# Patient Record
Sex: Female | Born: 1950
Health system: Southern US, Community
[De-identification: ages and names within clinical notes are randomized; demographics above are authoritative.]

## PROBLEM LIST (undated history)

## (undated) DIAGNOSIS — R112 Nausea with vomiting, unspecified: Secondary | ICD-10-CM

## (undated) DIAGNOSIS — B0231 Zoster conjunctivitis: Secondary | ICD-10-CM

## (undated) DIAGNOSIS — Z9889 Other specified postprocedural states: Secondary | ICD-10-CM

## (undated) DIAGNOSIS — K5792 Diverticulitis of intestine, part unspecified, without perforation or abscess without bleeding: Secondary | ICD-10-CM

## (undated) DIAGNOSIS — M549 Dorsalgia, unspecified: Secondary | ICD-10-CM

## (undated) DIAGNOSIS — M1990 Unspecified osteoarthritis, unspecified site: Secondary | ICD-10-CM

## (undated) DIAGNOSIS — G8929 Other chronic pain: Secondary | ICD-10-CM

## (undated) DIAGNOSIS — M25819 Other specified joint disorders, unspecified shoulder: Secondary | ICD-10-CM

## (undated) DIAGNOSIS — I82409 Acute embolism and thrombosis of unspecified deep veins of unspecified lower extremity: Secondary | ICD-10-CM

## (undated) DIAGNOSIS — Z950 Presence of cardiac pacemaker: Secondary | ICD-10-CM

## (undated) DIAGNOSIS — E039 Hypothyroidism, unspecified: Secondary | ICD-10-CM

## (undated) DIAGNOSIS — J302 Other seasonal allergic rhinitis: Secondary | ICD-10-CM

## (undated) DIAGNOSIS — I441 Atrioventricular block, second degree: Secondary | ICD-10-CM

## (undated) DIAGNOSIS — K219 Gastro-esophageal reflux disease without esophagitis: Secondary | ICD-10-CM

## (undated) DIAGNOSIS — Z780 Asymptomatic menopausal state: Secondary | ICD-10-CM

## (undated) DIAGNOSIS — M7540 Impingement syndrome of unspecified shoulder: Secondary | ICD-10-CM

## (undated) HISTORY — PX: TRIGGER FINGER RELEASE: SHX641

## (undated) HISTORY — PX: CHOLECYSTECTOMY: SHX55

## (undated) HISTORY — PX: EYE SURGERY: SHX253

## (undated) HISTORY — PX: TOTAL KNEE ARTHROPLASTY: SHX125

## (undated) HISTORY — PX: OTHER SURGICAL HISTORY: SHX169

## (undated) HISTORY — PX: VAGINAL HYSTERECTOMY: SUR661

## (undated) HISTORY — PX: ACHILLES TENDON SURGERY: SHX542

## (undated) HISTORY — DX: Unspecified osteoarthritis, unspecified site: M19.90

## (undated) HISTORY — DX: Asymptomatic menopausal state: Z78.0

## (undated) HISTORY — PX: KNEE ARTHROSCOPY: SHX127

## (undated) HISTORY — PX: ESOPHAGOGASTRODUODENOSCOPY: SHX1529

---
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
## 1973-08-25 HISTORY — PX: ARTHROGRAM KNEE: SUR67

## 1982-08-25 HISTORY — PX: KNEE ARTHROSCOPY W/ ACL RECONSTRUCTION: SHX1858

## 1998-01-08 ENCOUNTER — Encounter: Admission: RE | Admit: 1998-01-08 | Discharge: 1998-04-08 | Payer: Self-pay | Admitting: Specialist

## 1998-09-05 ENCOUNTER — Ambulatory Visit (HOSPITAL_COMMUNITY): Admission: RE | Admit: 1998-09-05 | Discharge: 1998-09-05 | Payer: Self-pay | Admitting: Specialist

## 1998-11-16 ENCOUNTER — Other Ambulatory Visit: Admission: RE | Admit: 1998-11-16 | Discharge: 1998-11-16 | Payer: Self-pay | Admitting: Obstetrics and Gynecology

## 1999-01-01 ENCOUNTER — Inpatient Hospital Stay (HOSPITAL_COMMUNITY): Admission: RE | Admit: 1999-01-01 | Discharge: 1999-01-06 | Payer: Self-pay | Admitting: Specialist

## 1999-06-10 ENCOUNTER — Inpatient Hospital Stay (HOSPITAL_COMMUNITY): Admission: RE | Admit: 1999-06-10 | Discharge: 1999-06-18 | Payer: Self-pay | Admitting: Specialist

## 1999-06-10 ENCOUNTER — Encounter: Payer: Self-pay | Admitting: Specialist

## 1999-07-23 ENCOUNTER — Ambulatory Visit (HOSPITAL_COMMUNITY): Admission: RE | Admit: 1999-07-23 | Discharge: 1999-07-23 | Payer: Self-pay | Admitting: Specialist

## 1999-11-06 ENCOUNTER — Ambulatory Visit (HOSPITAL_COMMUNITY): Admission: RE | Admit: 1999-11-06 | Discharge: 1999-11-06 | Payer: Self-pay | Admitting: Gastroenterology

## 1999-12-30 ENCOUNTER — Ambulatory Visit (HOSPITAL_COMMUNITY): Admission: RE | Admit: 1999-12-30 | Discharge: 1999-12-30 | Payer: Self-pay | Admitting: Obstetrics and Gynecology

## 1999-12-30 ENCOUNTER — Encounter: Payer: Self-pay | Admitting: Obstetrics and Gynecology

## 2000-03-19 ENCOUNTER — Ambulatory Visit (HOSPITAL_COMMUNITY): Admission: RE | Admit: 2000-03-19 | Discharge: 2000-03-19 | Payer: Self-pay | Admitting: Family Medicine

## 2000-03-19 ENCOUNTER — Encounter: Payer: Self-pay | Admitting: Family Medicine

## 2000-03-26 ENCOUNTER — Ambulatory Visit (HOSPITAL_BASED_OUTPATIENT_CLINIC_OR_DEPARTMENT_OTHER): Admission: RE | Admit: 2000-03-26 | Discharge: 2000-03-26 | Payer: Self-pay | Admitting: Otolaryngology

## 2000-06-19 ENCOUNTER — Other Ambulatory Visit: Admission: RE | Admit: 2000-06-19 | Discharge: 2000-06-19 | Payer: Self-pay | Admitting: Obstetrics and Gynecology

## 2000-10-02 ENCOUNTER — Ambulatory Visit (HOSPITAL_BASED_OUTPATIENT_CLINIC_OR_DEPARTMENT_OTHER): Admission: RE | Admit: 2000-10-02 | Discharge: 2000-10-02 | Payer: Self-pay | Admitting: Orthopedic Surgery

## 2001-08-12 ENCOUNTER — Emergency Department (HOSPITAL_COMMUNITY): Admission: EM | Admit: 2001-08-12 | Discharge: 2001-08-12 | Payer: Self-pay

## 2001-08-23 ENCOUNTER — Encounter: Admission: RE | Admit: 2001-08-23 | Discharge: 2001-10-19 | Payer: Self-pay | Admitting: *Deleted

## 2001-10-14 ENCOUNTER — Ambulatory Visit (HOSPITAL_COMMUNITY): Admission: RE | Admit: 2001-10-14 | Discharge: 2001-10-14 | Payer: Self-pay | Admitting: Gastroenterology

## 2001-12-09 ENCOUNTER — Other Ambulatory Visit: Admission: RE | Admit: 2001-12-09 | Discharge: 2001-12-09 | Payer: Self-pay | Admitting: Obstetrics and Gynecology

## 2002-04-06 ENCOUNTER — Ambulatory Visit (HOSPITAL_BASED_OUTPATIENT_CLINIC_OR_DEPARTMENT_OTHER): Admission: RE | Admit: 2002-04-06 | Discharge: 2002-04-07 | Payer: Self-pay | Admitting: *Deleted

## 2002-06-22 ENCOUNTER — Encounter: Payer: Self-pay | Admitting: Internal Medicine

## 2002-06-22 ENCOUNTER — Ambulatory Visit (HOSPITAL_COMMUNITY): Admission: RE | Admit: 2002-06-22 | Discharge: 2002-06-22 | Payer: Self-pay | Admitting: Internal Medicine

## 2003-05-08 ENCOUNTER — Encounter (INDEPENDENT_AMBULATORY_CARE_PROVIDER_SITE_OTHER): Payer: Self-pay | Admitting: Specialist

## 2003-05-08 ENCOUNTER — Ambulatory Visit (HOSPITAL_COMMUNITY): Admission: RE | Admit: 2003-05-08 | Discharge: 2003-05-08 | Payer: Self-pay | Admitting: Gastroenterology

## 2003-05-26 ENCOUNTER — Encounter: Payer: Self-pay | Admitting: Obstetrics and Gynecology

## 2003-05-26 ENCOUNTER — Ambulatory Visit (HOSPITAL_COMMUNITY): Admission: RE | Admit: 2003-05-26 | Discharge: 2003-05-26 | Payer: Self-pay | Admitting: Obstetrics and Gynecology

## 2004-09-12 ENCOUNTER — Emergency Department (HOSPITAL_COMMUNITY): Admission: EM | Admit: 2004-09-12 | Discharge: 2004-09-12 | Payer: Self-pay | Admitting: Emergency Medicine

## 2005-02-09 ENCOUNTER — Emergency Department (HOSPITAL_COMMUNITY): Admission: EM | Admit: 2005-02-09 | Discharge: 2005-02-09 | Payer: Self-pay | Admitting: *Deleted

## 2005-07-01 ENCOUNTER — Ambulatory Visit (HOSPITAL_COMMUNITY): Admission: RE | Admit: 2005-07-01 | Discharge: 2005-07-01 | Payer: Self-pay | Admitting: Internal Medicine

## 2006-01-28 ENCOUNTER — Ambulatory Visit (HOSPITAL_COMMUNITY): Admission: RE | Admit: 2006-01-28 | Discharge: 2006-01-28 | Payer: Self-pay | Admitting: Gastroenterology

## 2006-01-28 ENCOUNTER — Encounter (INDEPENDENT_AMBULATORY_CARE_PROVIDER_SITE_OTHER): Payer: Self-pay | Admitting: *Deleted

## 2006-02-18 ENCOUNTER — Emergency Department (HOSPITAL_COMMUNITY): Admission: EM | Admit: 2006-02-18 | Discharge: 2006-02-18 | Payer: Self-pay | Admitting: Family Medicine

## 2006-03-04 ENCOUNTER — Emergency Department (HOSPITAL_COMMUNITY): Admission: EM | Admit: 2006-03-04 | Discharge: 2006-03-04 | Payer: Self-pay | Admitting: Family Medicine

## 2006-06-26 ENCOUNTER — Emergency Department (HOSPITAL_COMMUNITY): Admission: EM | Admit: 2006-06-26 | Discharge: 2006-06-26 | Payer: Self-pay | Admitting: Family Medicine

## 2006-09-30 ENCOUNTER — Emergency Department (HOSPITAL_COMMUNITY): Admission: EM | Admit: 2006-09-30 | Discharge: 2006-09-30 | Payer: Self-pay | Admitting: Family Medicine

## 2007-11-03 ENCOUNTER — Inpatient Hospital Stay (HOSPITAL_COMMUNITY): Admission: EM | Admit: 2007-11-03 | Discharge: 2007-11-07 | Payer: Self-pay | Admitting: Emergency Medicine

## 2007-12-01 ENCOUNTER — Ambulatory Visit (HOSPITAL_COMMUNITY): Admission: RE | Admit: 2007-12-01 | Discharge: 2007-12-01 | Payer: Self-pay | Admitting: Gastroenterology

## 2007-12-01 ENCOUNTER — Encounter (INDEPENDENT_AMBULATORY_CARE_PROVIDER_SITE_OTHER): Payer: Self-pay | Admitting: Gastroenterology

## 2008-05-22 ENCOUNTER — Ambulatory Visit (HOSPITAL_COMMUNITY): Admission: RE | Admit: 2008-05-22 | Discharge: 2008-05-22 | Payer: Self-pay | Admitting: Obstetrics and Gynecology

## 2008-07-05 ENCOUNTER — Encounter: Admission: RE | Admit: 2008-07-05 | Discharge: 2008-08-24 | Payer: Self-pay | Admitting: Occupational Medicine

## 2008-08-15 ENCOUNTER — Ambulatory Visit (HOSPITAL_COMMUNITY): Admission: RE | Admit: 2008-08-15 | Discharge: 2008-08-15 | Payer: Self-pay | Admitting: Internal Medicine

## 2008-12-04 ENCOUNTER — Emergency Department (HOSPITAL_COMMUNITY): Admission: EM | Admit: 2008-12-04 | Discharge: 2008-12-04 | Payer: Self-pay | Admitting: Family Medicine

## 2009-01-04 ENCOUNTER — Encounter: Admission: RE | Admit: 2009-01-04 | Discharge: 2009-04-04 | Payer: Self-pay | Admitting: Neurosurgery

## 2009-01-21 ENCOUNTER — Emergency Department (HOSPITAL_COMMUNITY): Admission: EM | Admit: 2009-01-21 | Discharge: 2009-01-21 | Payer: Self-pay | Admitting: Family Medicine

## 2009-02-24 ENCOUNTER — Emergency Department (HOSPITAL_BASED_OUTPATIENT_CLINIC_OR_DEPARTMENT_OTHER): Admission: EM | Admit: 2009-02-24 | Discharge: 2009-02-24 | Payer: Self-pay | Admitting: Emergency Medicine

## 2009-02-24 ENCOUNTER — Ambulatory Visit: Payer: Self-pay | Admitting: Diagnostic Radiology

## 2009-03-02 ENCOUNTER — Ambulatory Visit (HOSPITAL_COMMUNITY): Admission: RE | Admit: 2009-03-02 | Discharge: 2009-03-02 | Payer: Self-pay | Admitting: Gastroenterology

## 2009-05-14 ENCOUNTER — Emergency Department (HOSPITAL_COMMUNITY): Admission: EM | Admit: 2009-05-14 | Discharge: 2009-05-14 | Payer: Self-pay | Admitting: Emergency Medicine

## 2009-05-15 ENCOUNTER — Emergency Department (HOSPITAL_COMMUNITY): Admission: EM | Admit: 2009-05-15 | Discharge: 2009-05-15 | Payer: Self-pay | Admitting: Family Medicine

## 2009-05-16 ENCOUNTER — Emergency Department (HOSPITAL_BASED_OUTPATIENT_CLINIC_OR_DEPARTMENT_OTHER): Admission: EM | Admit: 2009-05-16 | Discharge: 2009-05-16 | Payer: Self-pay | Admitting: Emergency Medicine

## 2009-06-05 ENCOUNTER — Emergency Department (HOSPITAL_COMMUNITY): Admission: EM | Admit: 2009-06-05 | Discharge: 2009-06-05 | Payer: Self-pay | Admitting: Family Medicine

## 2009-06-26 ENCOUNTER — Ambulatory Visit (HOSPITAL_COMMUNITY): Admission: RE | Admit: 2009-06-26 | Discharge: 2009-06-26 | Payer: Self-pay | Admitting: Obstetrics and Gynecology

## 2009-07-13 ENCOUNTER — Encounter: Admission: RE | Admit: 2009-07-13 | Discharge: 2009-07-13 | Payer: Self-pay | Admitting: Occupational Medicine

## 2009-09-20 ENCOUNTER — Ambulatory Visit (HOSPITAL_COMMUNITY): Admission: RE | Admit: 2009-09-20 | Discharge: 2009-09-20 | Payer: Self-pay | Admitting: Neurosurgery

## 2009-10-12 ENCOUNTER — Encounter: Admission: RE | Admit: 2009-10-12 | Discharge: 2010-01-10 | Payer: Self-pay | Admitting: Neurosurgery

## 2009-10-29 ENCOUNTER — Emergency Department (HOSPITAL_COMMUNITY): Admission: EM | Admit: 2009-10-29 | Discharge: 2009-10-29 | Payer: Self-pay | Admitting: Emergency Medicine

## 2009-11-09 ENCOUNTER — Emergency Department (HOSPITAL_COMMUNITY): Admission: EM | Admit: 2009-11-09 | Discharge: 2009-11-09 | Payer: Self-pay | Admitting: Family Medicine

## 2009-11-13 ENCOUNTER — Ambulatory Visit (HOSPITAL_COMMUNITY): Admission: RE | Admit: 2009-11-13 | Discharge: 2009-11-13 | Payer: Self-pay | Admitting: Gastroenterology

## 2009-12-19 ENCOUNTER — Ambulatory Visit: Payer: Self-pay | Admitting: Diagnostic Radiology

## 2009-12-19 ENCOUNTER — Ambulatory Visit (HOSPITAL_BASED_OUTPATIENT_CLINIC_OR_DEPARTMENT_OTHER): Admission: RE | Admit: 2009-12-19 | Discharge: 2009-12-19 | Payer: Self-pay | Admitting: Physician Assistant

## 2010-04-24 ENCOUNTER — Ambulatory Visit (HOSPITAL_COMMUNITY): Admission: RE | Admit: 2010-04-24 | Discharge: 2010-04-24 | Payer: Self-pay | Admitting: Orthopedic Surgery

## 2010-05-08 ENCOUNTER — Emergency Department (HOSPITAL_COMMUNITY): Admission: EM | Admit: 2010-05-08 | Discharge: 2010-05-08 | Payer: Self-pay | Admitting: Family Medicine

## 2010-07-03 ENCOUNTER — Ambulatory Visit (HOSPITAL_COMMUNITY): Admission: RE | Admit: 2010-07-03 | Discharge: 2010-07-03 | Payer: Self-pay | Admitting: Obstetrics and Gynecology

## 2010-08-22 ENCOUNTER — Emergency Department (HOSPITAL_COMMUNITY)
Admission: EM | Admit: 2010-08-22 | Discharge: 2010-08-22 | Payer: Self-pay | Source: Home / Self Care | Admitting: Family Medicine

## 2010-09-14 ENCOUNTER — Encounter (HOSPITAL_COMMUNITY): Payer: Self-pay | Admitting: Obstetrics and Gynecology

## 2010-09-15 ENCOUNTER — Encounter (HOSPITAL_COMMUNITY): Payer: Self-pay | Admitting: Obstetrics and Gynecology

## 2010-10-14 ENCOUNTER — Other Ambulatory Visit (HOSPITAL_COMMUNITY): Payer: Self-pay | Admitting: Obstetrics and Gynecology

## 2010-10-14 DIAGNOSIS — E8940 Asymptomatic postprocedural ovarian failure: Secondary | ICD-10-CM

## 2010-10-22 ENCOUNTER — Ambulatory Visit (HOSPITAL_COMMUNITY): Admission: RE | Admit: 2010-10-22 | Payer: Commercial Managed Care - PPO | Source: Ambulatory Visit

## 2010-11-05 ENCOUNTER — Ambulatory Visit (HOSPITAL_COMMUNITY)
Admission: RE | Admit: 2010-11-05 | Discharge: 2010-11-05 | Disposition: A | Discharge status: Home / Self Care | Payer: 59 | Source: Ambulatory Visit | Attending: Obstetrics and Gynecology | Admitting: Obstetrics and Gynecology

## 2010-11-05 DIAGNOSIS — Z780 Asymptomatic menopausal state: Secondary | ICD-10-CM | POA: Insufficient documentation

## 2010-11-05 DIAGNOSIS — E8940 Asymptomatic postprocedural ovarian failure: Secondary | ICD-10-CM

## 2010-11-05 DIAGNOSIS — Z13820 Encounter for screening for osteoporosis: Secondary | ICD-10-CM | POA: Insufficient documentation

## 2010-11-07 LAB — POCT URINALYSIS DIPSTICK
Glucose, UA: NEGATIVE mg/dL
Nitrite: NEGATIVE
Urobilinogen, UA: 0.2 mg/dL (ref 0.0–1.0)

## 2010-11-07 LAB — URINE CULTURE

## 2010-11-08 LAB — CBC
HCT: 36.1 % (ref 36.0–46.0)
Hemoglobin: 12.4 g/dL (ref 12.0–15.0)
MCHC: 34.3 g/dL (ref 30.0–36.0)
MCV: 90.7 fL (ref 78.0–100.0)

## 2010-11-08 LAB — SURGICAL PCR SCREEN: MRSA, PCR: NEGATIVE

## 2010-11-18 LAB — URINE CULTURE
Colony Count: NO GROWTH
Culture: NO GROWTH

## 2010-11-18 LAB — POCT URINALYSIS DIP (DEVICE)
Glucose, UA: NEGATIVE mg/dL
Glucose, UA: NEGATIVE mg/dL
Ketones, ur: NEGATIVE mg/dL
Ketones, ur: NEGATIVE mg/dL
Protein, ur: 100 mg/dL — AB
Specific Gravity, Urine: 1.015 (ref 1.005–1.030)
Specific Gravity, Urine: 1.015 (ref 1.005–1.030)

## 2010-11-25 ENCOUNTER — Inpatient Hospital Stay (INDEPENDENT_AMBULATORY_CARE_PROVIDER_SITE_OTHER)
Admission: RE | Admit: 2010-11-25 | Discharge: 2010-11-25 | Disposition: A | Discharge status: Home / Self Care | Payer: Commercial Managed Care - PPO | Source: Ambulatory Visit | Attending: Emergency Medicine | Admitting: Emergency Medicine

## 2010-11-25 DIAGNOSIS — N390 Urinary tract infection, site not specified: Secondary | ICD-10-CM

## 2010-11-25 LAB — POCT URINALYSIS DIP (DEVICE)
Protein, ur: NEGATIVE mg/dL
Urobilinogen, UA: 0.2 mg/dL (ref 0.0–1.0)

## 2010-11-26 LAB — URINE CULTURE: Culture: NO GROWTH

## 2010-11-29 LAB — DIFFERENTIAL
Basophils Absolute: 0.1 10*3/uL (ref 0.0–0.1)
Eosinophils Relative: 0 % (ref 0–5)
Lymphocytes Relative: 21 % (ref 12–46)
Monocytes Absolute: 0.7 10*3/uL (ref 0.1–1.0)
Monocytes Relative: 5 % (ref 3–12)

## 2010-11-29 LAB — BASIC METABOLIC PANEL
CO2: 28 mEq/L (ref 19–32)
GFR calc non Af Amer: >60 mL/min (ref >60)
Glucose, Bld: 94 mg/dL (ref 70–99)
Potassium: 3.8 mEq/L (ref 3.5–5.1)
Sodium: 144 mEq/L (ref 135–145)

## 2010-11-29 LAB — CBC
HCT: 38.6 % (ref 36.0–46.0)
Hemoglobin: 12.9 g/dL (ref 12.0–15.0)
RDW: 13.3 % (ref 11.5–15.5)

## 2010-12-02 LAB — COMPREHENSIVE METABOLIC PANEL
Albumin: 4.6 g/dL (ref 3.5–5.2)
BUN: 12 mg/dL (ref 6–23)
Calcium: 9.6 mg/dL (ref 8.4–10.5)
Creatinine, Ser: 0.7 mg/dL (ref 0.4–1.2)
Potassium: 3.9 mEq/L (ref 3.5–5.1)
Total Protein: 8.3 g/dL (ref 6.0–8.3)

## 2010-12-02 LAB — DIFFERENTIAL
Lymphocytes Relative: 38 % (ref 12–46)
Monocytes Absolute: 0.5 10*3/uL (ref 0.1–1.0)
Monocytes Relative: 7 % (ref 3–12)
Neutro Abs: 4.2 10*3/uL (ref 1.7–7.7)
Neutrophils Relative %: 54 % (ref 43–77)

## 2010-12-02 LAB — CBC
HCT: 41.3 % (ref 36.0–46.0)
MCHC: 33.4 g/dL (ref 30.0–36.0)
MCV: 93.9 fL (ref 78.0–100.0)
Platelets: 328 10*3/uL (ref 150–400)
RDW: 12.4 % (ref 11.5–15.5)

## 2010-12-02 LAB — URINALYSIS, ROUTINE W REFLEX MICROSCOPIC
Glucose, UA: NEGATIVE mg/dL
Hgb urine dipstick: NEGATIVE
Specific Gravity, Urine: 1.011 (ref 1.005–1.030)
Urobilinogen, UA: 0.2 mg/dL (ref 0.0–1.0)

## 2011-01-07 NOTE — H&P (Signed)
Kari [name], Kari [name]           ACCOUNT NO.:  [id number]   MEDICAL RECORD NO.:  [id number]          PATIENT TYPE:  INP   LOCATION:  5151                         FACILITY:  [facility]   PHYSICIAN:  Jordan Hawks. Elnoria Howard, MD    DATE OF BIRTH:  [birth year]   DATE OF ADMISSION:  11/03/2007  DATE OF DISCHARGE:                              HISTORY & PHYSICAL   REASON FOR ADMISSION:  Diverticulitis.   HISTORY OF PRESENT ILLNESS:  This is a 60 year old female with a past  medical history of diverticulitis status post cholecystectomy and herpes  ulcer with ophthalmic herpes zoster, hysterectomy, and knee surgery who  is admitted to the hospital with complaints of acute left lower quadrant  abdominal pain.  The patient states that her pain started rapidly on the  day of admission and progressively worsened.  She presented to the  office and was evaluated by Dr. Loreta Ave who felt that she needed to present  to their emergency room for further evaluation and treatment.  While in  the emergency room she underwent a CT scan, but it was unrevealing for  diverticulitis; however, is confirmatory for diverticulosis.  The  patient states that the pain that she currently experiences is very  similar to her prior diverticulitis pain approximately seven years ago.  There is associated nausea but no vomiting.  She denies having any  fever.   There is no history of any diarrhea or constipation and no hematochezia  or melena.   PAST MEDICAL HISTORY/PAST SURGICAL HISTORY:  As stated above.   FAMILY HISTORY:  Noncontributory.   SOCIAL HISTORY:  Negative for alcohol, tobacco, illicit drug use.   REVIEW OF SYSTEMS:  As stated above in History of Present Illness;  otherwise, negative.   ALLERGIES:  CEFTIN and LEVAQUIN.   HOME MEDICATIONS:  Valtrex and estrogen.   PHYSICAL EXAMINATION:  VITAL SIGNS:  Blood pressure is 117/78, heart  rate 85, respirations 24, temperature 99.2.  GENERAL:  The patient is uncomfortable  in appearance.  HEENT: Normocephalic, atraumatic.  Extraocular muscles intact.  NECK:  Supple.  No lymphadenopathy.  LUNGS:  Clear to auscultation bilaterally.  CARDIOVASCULAR:  Regular rate and rhythm.  ABDOMEN:  Flat, soft, tender in the left lower quadrant with some  evidence of mild rebound.  No rigidity.  Positive bowel sounds.  EXTREMITIES:  No clubbing, cyanosis or edema   LABORATORY VALUES:  White blood cell count 12.4, hemoglobin 13.3,  platelets 293.  Sodium 137, potassium 3.6, chloride 102, CO2 25, BUN 7,  creatinine 0.8, glucose 93.  AST 26, ALT 26.  Alk phos 69, total  bilirubin 1.6, albumin 4.1.  Urinalysis is negative.   IMPRESSION:  1. Probable diverticulitis.  2. Herpes zoster with common complications.  After evaluation the      patient despite the negative evidence of CT scan it does appear      that she does have diverticulitis.  She is markedly tender in the      left lower quadrant.  There is a mild elevation in her white blood      cell count, but no evidence of  any fever.  Given her clinical      presentation I will go ahead and treat her  for diverticulitis.   PLAN:  To treat with Flagyl 500 mg IV q.8 h., Cipro 400 mg IV q.12 h.,  Zofran 4 mg IV q.8 h., and Dilaudid 1-2 mg IV q.2 h. p.r.n. pain.      Jordan Hawks Elnoria Howard, MD  Electronically Signed     PDH/MEDQ  D:  11/03/2007  T:  11/05/2007  Job:  [id number]

## 2011-01-07 NOTE — Op Note (Signed)
Kari [name], Kari [name]           ACCOUNT NO.:  [id number]   MEDICAL RECORD NO.:  [id number]          PATIENT TYPE:  AMB   LOCATION:  ENDO                         FACILITY:  [facility]   PHYSICIAN:  Anselmo Rod, M.D.  DATE OF BIRTH:  Oct 25, 1950   DATE OF PROCEDURE:  12/01/2007  DATE OF DISCHARGE:                               OPERATIVE REPORT   PROCEDURE:  Colonoscopy with cold biopsies x 4.   ENDOSCOPIST:  Dr. Anselmo Rod.   INSTRUMENT USED:  Pentax video colonoscope.   INDICATIONS FOR PROCEDURE:  This 60 year old white female with a history  of left lower quadrant pain, a recent hospitalization for what was  thought to be acute diverticulitis, undergoing a colonoscopy for a  questionable mass in the sigmoid colon.   PRE-PROCEDURE PREPARATION:  An informed consent was procured from the  patient.  The patient was fasted for four hours prior to the procedure,  after being prepped with 20 of OsmoPrep on the morning of the procedure.  The risks and benefits of the procedure, including a 10% risk rate of  cancer and polyp were discussed with the patient as well.   PRE-PROCEDURE PHYSICAL EXAMINATION:  VITAL SIGNS:  Stable.  NECK:  Supple.  CHEST:  Clear to auscultation.  HEART:  S1 and S2 regular.  ABDOMEN:  Soft, with normal bowel sounds.   DESCRIPTION OF PROCEDURE:  The patient was placed in the left lateral  decubitus position and sedated with 100 mcg of Fentanyl and 10 mg of  Versed given intravenously in incremental doses.  Once the patient was  adequately sedated and maintained on low-flow oxygen and continuous  cardiac monitoring, the Pentax video colonoscope was advanced from the  rectum to the cecum.  The appendiceal orifice and ileocecal valve were  clearly visualized in full throughout the terminal ileum.  Appeared  healthy and without lesions.  A few scattered sigmoid diverticula were  noted.  Some erosions were also noted in the left colon.  Cold biopsies  were  done x4.  Retroflexion in the rectum revealed no abnormalities. The  patient tolerated the procedure well without immediate complications.   IMPRESSION:  1. A few scattered diverticula in the left colon.  2. Scattered erosions in the distal descending colon.  Biopsies done.  3. Normal transverse colon, right colon, cecum and terminal ileum.   RECOMMENDATIONS:  1. Await pathology results.  2. Avoid all non-steroidals including aspirin for the next two weeks.  3. Continue to increase fluids and fiber in the diet.  4. Repeat colonoscopy in the next 10 years.  5. If the patient has any abnormal symptoms in the interim, she is to      contact the office immediately for further recommendations.  6. Outpatient followup as the need arises in the future.      Anselmo Rod, M.D.  Electronically Signed     JNM/MEDQ  D:  12/01/2007  T:  12/01/2007  Job:  [id number]   cc:   Merlene Laughter. Renae Gloss, M.D.  Fax: [phone]   Leonie Man, M.D.  [address]  [place]  [place] [address]

## 2011-01-07 NOTE — Discharge Summary (Signed)
Kari [name], Kari [name]           ACCOUNT NO.:  [id number]   MEDICAL RECORD NO.:  [id number]          PATIENT TYPE:  INP   LOCATION:  5151                         FACILITY:  [facility]   PHYSICIAN:  Jordan Hawks. Elnoria Howard, MD    DATE OF BIRTH:  Jun 28, 1951   DATE OF ADMISSION:  11/03/2007  DATE OF DISCHARGE:  11/07/2007                               DISCHARGE SUMMARY   ADMISSION DIAGNOSIS:  Diverticulitis.   DISCHARGE DIAGNOSIS:  Diverticulitis.   HISTORY AND PHYSICAL:  Please see the original H&P for full details.   HOSPITAL COURSE:  The patient was admitted with a clinical presentation  of diverticulitis.  A CT scan was performed on the day of admission,  however, is negative for any evidence of infection; however, there were  noted small diverticula.  Given the left lower quadrant pain and the  elevation of white blood cell count, it was felt that this is the most  consistent etiology.  She was started on ciprofloxacin 400 mg IV q.12 h.  and metronidazole 500 mg IV q.8 h.  Over the course of her  hospitalization, the pain did improve and her white count did resolve,  it did __________  normalize.  However, she complained of a significant  amount of nausea.  Her Zofran was increased from q.6 h. to q.4 h., which  did help.  Additionally, the metronidazole dosage was decreased from 500  mg to 250 mg.  On the day of discharge, the patient remarked having a  significant improvement in her nausea, and there was also a marked  improvement in the left lower quadrant pain.  She did have some symptoms  of persistent bloating; however, all of her admission symptoms were  improved or resolved.  The patient was noted to be mildly hypokalemic.  During her hospitalization, she was given a total of 80 mEq of potassium  chloride p.o.  At the time of discharge, her potassium is pending.  However, she was provided with a prescription should her potassium be  noted to be low.   PLAN:  __________  the patient  complete 9 additional days of  ciprofloxacin and metronidazole.  She will have Zofran in case of  persistent nausea and a prescription for potassium chloride if required.  The patient will also follow up with Dr. Loreta Ave in approximately 1-2  weeks.      Jordan Hawks Elnoria Howard, MD  Electronically Signed     PDH/MEDQ  D:  11/07/2007  T:  11/07/2007  Job:  [id number]

## 2011-01-10 NOTE — Op Note (Signed)
[name]. [facility]  Patient:    Kari [name], Kari [name]                  MRN: [id number] Proc. Date: 03/26/00 Adm. Date:  [id number] Disc. Date: [id number] Attending:  Fernande Boyden CC:         Joycelyn Rua, M.D.   Operative Report  PREOPERATIVE DIAGNOSIS:  Throat pain.  POSTOPERATIVE DIAGNOSIS:  Throat pain.  PROCEDURE PERFORMED:  Direct laryngoscopy and biopsy, bronchoscopy, esophagoscopy.  SURGEON:  Gloris Manchester. Lazarus Salines, M.D.  ANESTHESIA:  General orotracheal.  ESTIMATED BLOOD LOSS:  Minimal.  COMPLICATIONS:  Chipped outer table of a previously extensively reconstructed left mandibular molar, versus premolar.  Minor pinch bruising of the right lower lip.  FINDINGS:  Narrow mandible and maxilla with crowed teeth and multiple reconstructions.  Normal moist membranes.  Atrophic left tongue, consistent with a distant injury.  On palpation, no abnormalities in the oral cavity, nasopharynx, oropharynx, or hypopharynx.  No adenopathy in the neck.  Normal laryngeal crepitus.  On endoscopy, a normal endolarynx with no signs of contact lesion.  Normal valleculae, piriformis, postcricoid, and both surfaces of the epiglottis.  Normal laryngeal subglottis.  Normal esophageal introitus. On bronchoscopy, normal tracheal bronchial tree to the level of the carina. On cervical esophagoscopy, copious clear reflux fluid present to the level of the cricopharyngeus, with no distinct lesions and no erythema.  Moderately prominent lingual tonsils with biopsies taken from the left posterior tongue. No prominent sialar processes.  No palpable abnormalities to buy digital palpation of the submandibular gland.  DESCRIPTION OF PROCEDURE:  With the patient in the comfortable supine position, general mask anesthesia was administered.  At an appropriate level, the table was turned 90 degrees.  A rubber tooth guard was placed. Recognizing the narrowed jaws and the  multiple reconstructions, including upper anterior crowns, great care was taken to introduce the sliding laryngoscope.  The endolarynx was visualized.  The slider was removed, and a #7 bronchoscope was easily passed into the trachea.  The laryngoscope was removed.  A tracheostomy down to the level of the carina was performed, with no significant findings.  The bronchoscope was carefully removed, and the area of the endolarynx was fully examined.  There was no evidence of a contact granuloma or ulceration.  No lesions.  Fully normal and mobile vocal cords. At this point, the sliding laryngoscope was reintroduced. The bronchoscope was removed.  Under direct vision, the patient was intubated with a 7.0 endotracheal tube.  The cuff was inflated, and ventilation was assumed per the endotracheal tube.  Prior, the ventilation had been assumed per the bronchoscope.  At this point, the entire oral cavity, nasopharynx, oropharynx, hypopharynx, and external neck were thoroughly palpated, with no significant findings.  At this point, the Hollinger laryngoscope was introduced and careful direct laryngoscopy was performed.  The findings were as described above.  The laryngoscope was removed.  A lubricated cervical esophagoscope was next introduced and passed at the cricopharyngeus and easily into the cervical esophagus.  Reflux fluid was suctioned free.  No specific lesions were identified.  The esophagoscope was removed.  At this point the sliding laryngoscope was reintroduced for better access to the base of the tongue.  Multiple superficial and deep biopsies were performed on the lingual tonsillar tissue on the left base of the tongue.  Mild bleeding was encountered.  This was controlled with suction cautery.  Upon achieving hemostasis in the oropharynx, small amounts of blood were suctioned  free. Upon exiting the mouth, small crumbled pieces of what appeared to be dental material were  encountered, and it was discovered that the lateral surface of a left mandibular, either molar versus premolar, was a very large central amalgam that had apparently chipped loose.  A careful surveillance revealed no other dental fragments to be removed.  At this point the oral cavity was suctioned free.  The procedure was completed.  The patient was returned to anesthesia, awakened, extubated, and transferred to the recovery room in stable condition.  COMMENTS:  This 60 year old white female with a two to three-month history of subtle but persistent throat pain, with a prior negative workup from the GI personnel, and also a cardiac workup.  A CT and MRI scan suggested some abnormalities in the lingual tonsil and base of the tongue region.  Part of them consistent with lingual tonsillar tissue, and part of them consistent with atrophy of the left tongue, consistent with her prior trauma.  Operative intervention was felt necessary, given the patients significant anxiety regarding a definite diagnosis.  Anticipate routine postoperative recovery, and also anticipate that the pathology will be benign.  DISPOSITION:  Will call her with the pathology report when available. Meanwhile, will emphasize ice and elevation.  Will ask her to seek assistance from her dentist for reconstruction of the left mandibular tooth.  Given a low anticipated risk of post-anesthetic or post-surgical complications.  Feel an outpatient venue is appropriate. DD:  03/26/00 TD:  03/28/00 Job: [id number]

## 2011-01-10 NOTE — Procedures (Signed)
[place]. [facility]  Patient:    Kari [name], Kari [name] Visit Number: [id number] MRN: [id number]          Service Type: EMS Location: [facility] [place] Attending Physician:  Armanda Heritage Dictated by:   Anselmo Rod, M.D. Proc. Date: 10/14/01 Admit Date:  08/12/2001 Discharge Date: 08/12/2001   CC:         Duncan Dull, M.D.   Procedure Report  INCOMPLETE  DATE OF BIRTH:  1951/01/07.  PROCEDURE:  Colonoscopy.  ENDOSCOPIST:  Anselmo Rod, M.D.  INSTRUMENT USED:  Olympus video colonoscope.  INDICATION FOR PROCEDURE:  Abnormal CT scan in a 59 year old white female who has had sigmoid diverticulitis diagnosed in her CT scan done in December 2002. An infiltrative tumor could not be ruled out, and therefore colonoscopy is being done to further evaluate her colon.  PREPROCEDURE PREPARATION:  Informed consent was procured from the patient. The patient was fasted for eight hours prior to the procedure and prepped with a bottle of magnesium citrate and a gallon of NuLytely the night prior to the procedure.  PREPROCEDURE PHYSICAL:  VITAL SIGNS:  The patient had stable vital signs.  NECK:  Supple.  CHEST:  Clear to auscultation.  S1, S2 regular.  ABDOMEN:  Soft with normal bowel sounds.  DESCRIPTION OF PROCEDURE:  The patient was placed in the left lateral decubitus position and sedated with 80 mg of Demerol and 8 mg of Versed intravenously.  Once the patient was adequately sedate and maintained on low-flow oxygen and continuous cardiac monitoring, the Olympus video colonoscope was advanced from the rectum to the cecum without difficulty.  The patient had an excellent prep.  Surprisingly, no diverticula were seen; however, this does not rule out the presence of diverticulosis as seen on the CT scan done recently.  The procedure was complete up to the cecum.  The ileocecal valve and the appendiceal orifice were clearly visualized and photographed.   No masses, polyps, erosions, or ulcerations were noticed, and small internal hemorrhoids were seen on retroflexion in the rectum.  The patient tolerated the procedure well without complication.  IMPRESSION:  Healthy-appearing colon up to the cecum except for small, nonbleeding internal hemorrhoid.  RECOMMENDATIONS: 1. A high-fiber diet is to be Dictated by:   Anselmo Rod, M.D. Attending Physician:  Armanda Heritage DD:  10/14/01 TD:  10/14/01 Job: [id number]

## 2011-01-10 NOTE — Op Note (Signed)
NAME:  Kari [name], Kari [name] NO.:  [id number]   MEDICAL RECORD NO.:  [id number]                    PATIENT TYPE:   LOCATION:                                       FACILITY:   PHYSICIAN:  Lowell Bouton, M.D.      DATE OF BIRTH:   DATE OF PROCEDURE:  DATE OF DISCHARGE:                                 OPERATIVE REPORT   PREOPERATIVE DIAGNOSIS:  Carpometacarpal arthritis, left thumb.   POSTOPERATIVE DIAGNOSIS:  Carpometacarpal arthritis, left thumb.   PROCEDURE:  Excision of trapezium with flexor carpi radialis tendon,  stabilization of first metacarpal and interpositional arthroplasty left  thumb.   SURGEON:  Lowell Bouton, M.D.   ANESTHESIA:  General.   OPERATIVE FINDINGS:  The patient had moderate changes of scaphotrapezial  arthritis.  There was some spur formation.   PROCEDURE:  Under general anesthesia with the tourniquet on the left arm the  left hand was prepped and draped in a usual fashion.  After exsanguinating  the limb the tourniquet was inflated to 225 mmHg.  A curved incision was  made over the dorsal aspect of the carpometacarpal joint of the left thumb.  Sharp dissection was carried through the subcutaneous tissues and bleeding  points were coagulated.  Sharp dissection was carried between the tendons of  the APL and the EPB down to the [facility] joint and to the capsule of the joint.  The capsule was elevated dorsally and volarly.  A Freer elevator was  inserted in the carpometacarpal joint and the joint was found to be  moderately arthritic.  The scaphotrapezial joint was then examined and the  trapezium was exposed completely taking care to protect the dorsal branch of  the radial artery.  Osteotome was used to divide the trapezium into  quarters.  The trapezium was then removed in a piecemeal fashion with a  rongeur.  After completely excising the trapezium the FCR tendon was  identified in the floor of the defect.   The FCR tendon graft was harvested  volarly through three transfer incisions that measured about a centimeter in  width.  The first incision was made over the FCR proximally about 8 cm  proximal to the wrist and the ulnar half of the tendon was divided.  A 4-0  Mersilene suture was placed in the tendon and then it was traced down to a  more distal incision and the graft was harvested and brought through the  distal incision into the defect of the trapezium.  A drill hole was then  made through the based of the first metacarpal after denuding any remaining  cartilage from the proximal end of the metacarpal.  A drill hole was placed  from volar to dorsal ulnar.  The FCR tendon graft was left attached at the  distal end and was brought through the drill hole stabilizing the first  metacarpal.  The tendon was tacked down to the periosteum with 4-0 Mersilene  volarly.  The tendon was then rolled back on itself in an anchovy type  fashion and was placed in the defect of the trapezium.  A 4-0 Mersilene  suture was used to tie the anchovy down into the space that remained.  This  allowed for good cushioning between the proximal metacarpal and the carpus.  The joint was then irrigated with saline and the capsule was closed with 4-0  Vicryl and the vessel loop drain was left in for drainage.  Subcutaneous  tissue was closed with  4-0 Vicryl.  The skin was closed with a 3-0 subcuticular Prolene.  The  tendon donor sites were closed with 4-0 nylon sutures.  Sterile dressings  were applied followed by a thumb spica splint.  The patient had the  tourniquet released with good circulation in the hand.  She went to the  recovery room awake, stable and in good condition.                                               Lowell Bouton, M.D.    EMM/MEDQ  D:  04/06/2002  T:  04/08/2002  Job:  [id number]   cc:   Duncan Dull, M.D.

## 2011-01-10 NOTE — Procedures (Signed)
[place]. [facility]  Patient:    Kari [name], Kari [name]                 MRN: [id number] Proc. Date: 11/06/99 Attending:  Anselmo Rod, M.D. CC:         Doreatha Lew, M.D.                           Procedure Report  DATE OF BIRTH:  1950/09/22  REFERRING PHYSICIAN:  Doreatha Lew, M.D.  PROCEDURE PERFORMED:  Esophagogastroduodenoscopy.  ENDOSCOPIST:  Anselmo Rod, M.D.  INSTRUMENTS USED:  Olympus video panendoscope.  INDICATION FOR PROCEDURE:  A 60 year old white female with a severe epigastric ain and retrosternal burning.  Rule out peptic ulcer disease, esophagitis, gastritis, etc.  PREPROCEDURE PREPARATION:  Informed consent was procured from the patient.  The  patient was fasted for eight hours prior to the procedure.  PREPROCEDURE PHYSICAL EXAMINATION:  VITAL SIGNS:  The patient had stable vital signs.  NECK:  Supple.  CHEST:  Clear to auscultation.  S1, S2 regular.  ABDOMEN:  Soft with normal abdominal bowel sounds.  DESCRIPTION OF THE PROCEDURE:  The patient was placed in the left lateral decubitus position, and sedated with 60 mg of Demerol and 7 mg of Versed intravenously. nce the patient was adequately sedated and maintained on low-flow oxygen with continuous cardiac monitoring, the Olympus video panendoscope was advanced through the mouthpiece over the tongue, into the esophagus under direct vision.  The entire esophagus appeared normal without evidence of ring, stricture, mass, lesion, esophagitis, or Barretts mucosa. There was no segmental narrowing seen.  The scope was then advanced into the stomach.  Except for mild antral gastritis, no  other abnormalities were recognized.  There was no hiatal hernia, ulcers, erosions, etc.  The duodenal bulb and the small bowel, ______ appeared normal.  There was no outlet obstruction.  The patient tolerated the procedure well without complications.  IMPRESSION:   Mild antral gastritis, otherwise normal esophagogastroduodenoscopy.  RECOMMENDATIONS: 1. The patient may benefit from angiolytics.  The will be discussed with her and    angiolytics prescribed as needed. 2. She has been advised to pickup some samples of Protonix 40 mg from the    office and to use one every morning an hour before breakfast. 3. Antireflux measures have been emphasized. 4. She has strongly been advised to refrain from use of all nonsteroidals. 5. Outpatient followup is advised in the next two weeks for further    recommendations. DD:  11/06/99 TD:  11/06/99 Job: [id number]

## 2011-01-10 NOTE — Op Note (Signed)
NAME:  Kari [name], Kari [name]                     ACCOUNT NO.:  [id number]   MEDICAL RECORD NO.:  [id number]                   PATIENT TYPE:  AMB   LOCATION:  ENDO                                 FACILITY:  [facility]   PHYSICIAN:  Anselmo Rod, M.D.               DATE OF BIRTH:  06-Oct-1950   DATE OF PROCEDURE:  05/08/2003  DATE OF DISCHARGE:                                 OPERATIVE REPORT   PROCEDURE:  Esophagogastroduodenoscopy with biopsies.   ENDOSCOPIST:  Anselmo Rod, M.D.   INSTRUMENT USED:  Olympus video panendoscope.   INDICATIONS FOR PROCEDURE:  Ongoing epigastric pain with worsening symptoms  in the recent past in a 60 year old white female who is status post  cholecystectomy.  Rule out peptic ulcer disease, esophagitis, gastritis,  etc.   PRE-PROCEDURE PREPARATION:  An informed consent was procured from the  patient.  The patient was fasted for eight hours prior to the procedure.   PRE-PROCEDURE PHYSICAL EXAM:  VITAL SIGNS:  Stable.  NECK:  Supple.  CHEST:  Clear to auscultation.  HEART:  S1, S2 regular.  ABDOMEN:  Soft with normal bowel sounds.   DESCRIPTION OF PROCEDURE:  The patient was placed in the left lateral  decubitus position and sedated with 80 mg of Demerol and 8 mg of Versed  intravenously.  When the patient was adequately sedated and maintained on  low-flow oxygen and continuous cardiac monitoring, the Olympus video  panendoscope was advanced through the mouth piece, over the tongue, into the  esophagus under direct vision.  The entire esophagus appeared normal, with  no evidence of rings, strictures, masses, esophagitis, or Barrett's mucosa.  The scope was then advanced into the stomach.  A small sessile polyp was  biopsied from the mid-body of the stomach.  There was evidence of mild  antral gastritis.  Retroflexion in the high cardia revealed no  abnormalities.  The proximal __________  appeared normal.   IMPRESSION:  1. Normal-appearing  esophagus and proximal __________  wall.  2. Small sessile polyp, biopsies from the mid-body of the stomach.  3. Antral gastritis.  No ulcers, erosions, or masses seen.   RECOMMENDATIONS:  1. Await pathology results.  2. Continue PPI's and Carafate.  3.     Treat with antibiotic if H. pylori present.  4. A CT scan of the abdomen and pelvis if symptoms persist.  5. Outpatient followup in the next two weeks.                                               Anselmo Rod, M.D.    JNM/MEDQ  D:  05/08/2003  T:  05/08/2003  Job:  [id number]   cc:   Harrel Lemon. Merla Riches, M.D.  [address]  [place]  [place] [address]  Fax: [phone]

## 2011-01-10 NOTE — Op Note (Signed)
[name], Kari           ACCOUNT NO.:  [id number]   MEDICAL RECORD NO.:  [id number]          PATIENT TYPE:  AMB   LOCATION:  ENDO                         FACILITY:  [facility]   PHYSICIAN:  Anselmo Rod, M.D.  DATE OF BIRTH:  Jan 05, 1951   DATE OF PROCEDURE:  01/28/2006  DATE OF DISCHARGE:                                 OPERATIVE REPORT   PROCEDURE PERFORMED:  Esophagogastroduodenoscopy with antral biopsies.   ENDOSCOPIST:  Anselmo Rod, M.D.   INSTRUMENT USED:  Olympus video panendoscope.   INDICATION FOR PROCEDURE:  A 60 year old white female with history of severe  chest pain and reflux not responding to PPI and H2 blockers, undergoing EGD  to rule out esophagitis, gastritis, etc.  The patient has been under a lot  of stress at this time because of her work situation and feels the stress is  worsening her symptoms.   PREPROCEDURE PREPARATION:  Informed consent was procured from the patient.  The patient was fasted for 4 hours prior to the procedure.  The risks and  benefits of the procedure were discussed with the patient in great detail.   PREPROCEDURE PHYSICAL:  VITAL SIGNS:  The patient had stable vital signs.  NECK:  Supple.  CHEST:  Clear to auscultation.  S1, S2 regular.  ABDOMEN:  Soft with normal bowel sounds.   DESCRIPTION OF PROCEDURE:  The patient was placed in the left lateral  decubitus position and sedated with 75 mcg of fentanyl and 10 mg of Versed  in slow incremental doses.  Once the patient was adequately sedate and  maintained on low-flow oxygen and continuous cardiac monitoring, the Olympus  video panendoscope was advanced through the mouthpiece, over the tongue,  into the esophagus under direct vision.  The entire esophagus appeared  normal with no evidence of ring, stricture, mass, esophagitis, Barrett's  mucosa.  The scope was then advanced into the stomach.  Extrinsic  compression of the proximal stomach was noted, but the gastric mucosa  appeared essentially normal except for the antrum that showed evidence of  mild gastritis.  Biopsies were done to rule out presence of H. pylori by  pathology.  A small hiatal hernia was seen on high retroflexion.  The  proximal small bowel appeared normal.  There was no outlet obstruction.   IMPRESSION:  1.  Normal-appearing esophagus and proximal small bowel.  2.  Small hiatal hernia.  3.  Antral gastritis, biopsy done for Helicobacter pylori.  4.  Questionable extrinsic compression of the stomach.   RECOMMENDATIONS:  1.  Continue PPIs.  2.  Avoid all nonsteroidals for now.  3.  Trial of anxiolytics.  4.  CT scan of the abdomen and pelvis to further evaluate extrinsic      compression of the stomach.  Further recommendations made thereafter.      Anselmo Rod, M.D.  Electronically Signed     JNM/MEDQ  D:  01/29/2006  T:  01/30/2006  Job:  [id number]   cc:   Harrel Lemon. Merla Riches, M.D.  Fax: [phone]

## 2011-01-10 NOTE — Op Note (Signed)
[place]  Patient:    Kari [name]                  MRN: [id number] Proc. Date: 10/02/00 Adm. Date:  [id number] Disc. Date: [id number] Attending:  Susa Day                           Operative Report  PREOPERATIVE DIAGNOSIS:  Severe pain right thumb status post splinter impalement-type injury to radial aspect of right thumb pulp at radial mid lateral line adjacent to interphalangeal joint.  Rule out retained foreign body versus neurovascular injury versus early infection.  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  OPERATION:  Exploration of right thumb pulp to rule out foreign body versus neurovascular injury versus early sepsis.  SURGEON:  Katy Fitch. Sypher, Montez Hageman., M.D.  ASSISTANT:  Marveen Reeks. Dasnoit, P.A.-C.  ANESTHESIA: 2% plain lidocaine wrist block of median nerve supplemented by dorsal radial sensory branch block at base of thumb metacarpal.  SUPERVISING ANESTHESIOLOGIST:  Judie Petit, M.D.  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  On September 30, 2000, while sanding a heart of pine floor, she accidentally impaled her right thumb on a splinter during the "backstroke."  She sustained a penetrating injury to the radial aspect of her thumb pulp and mid radial lateral line.  She was able to remove the splinter.  She had extreme pain thereafter which increased during the ensuing 24 hours.  She sought a consult from Dr. Shaune Pollack at [facility] and was advised to seek a hand surgery consult with our office.  She was seen late on the afternoon of October 02, 2000.  At that time, she appeared clinically well but had extreme pain in her right thumb and was unable to tolerate palpation over the entry point or the distal centimeter of the thumb  distal phalanx heading towards the radial nail fold.  Her thumb was swollen and slightly inflamed with rubor at the nail fold.  This had the appearance of a possible retained foreign body versus early cellulitis.  She had been placed on Cipro 500 mg b.i.d. by Dr. Kevan Ny but was very intolerant of the medication, experiencing stomach upset.  Due to the degree of tenderness with the history of foreign body penetration and due to the firmness, we recommended exploration to rule out retained foreign body.  An alternative explanation for extreme pain was a direct injury to the radial proper digital nerve.  We recommended exploration under regional anesthesia at this time.  She was n.p.o. since 10 a.m. this morning.  After informed consent, she is brought to the operating room at this time.  DESCRIPTION OF PROCEDURE:  Maine is brought to the operating room and placed in supine position on the operating table.  Following light sedation, the right arm was prepped with Betadine soap and solution and sterilely draped.  Lidocaine 2% was infiltrated into the region of the median nerve at the distal resection crease to obtain a wrist block, and the dorsal radial sensory branches of the thumb were blocked at the base of the thumb metacarpal with 2% lidocaine.  When anesthesia was satisfactory, the arm was elevated and exsanguinated by direct pressure.  An arterial tourniquet was inflated to 220 mmHg.  The point of entry was incised through an oblique incision with care  taken to identify Cleland ligaments and the radial neurovascular bundle.  There was considerable hemorrhage in the subcutaneous region palmar to [facility] ligaments, and the radial digital nerve was significantly contused.  On palpation, there was a firm area deep to the nerve which was suspicious for foreign body.  The nerve was gently neurolysed with the use of iris scissors and fine forceps.  Cleland ligaments were  released over a distance of 1 cm, allowing palmar retraction of the nerve.  The IP joint capsule was identified and found to be abraded, but no clear penetration was noted.  The subcutaneous tissue on the dorsal aspect of the distal phalanx and nail fold region was gently undermined and palpated with the Freer.  No wood foreign body could be identified.  The neurovascular bundle was lysed over a distance approximately 8 mm proximal and 10 mm distal.  Full range of motion of the IP joint did not reveal signs of a retained foreign body within the joint.  X-rays of the finger had been studied in the office and demonstrated minimal degenerative changes at the IP joint; however, there was no sign of radiopaque retained foreign body.  The wound was thoroughly lavaged with sterile saline followed by repair of the wound with a corner suture of 5-0 nylon and loose closure with 5-0 nylon along the limbs of the Brunner incision.  The wound was dressed with Adaptic, sterile gauze, and Coban.  There were no apparent complications.  Ms. Philis Pique was transferred to the recovery room with stable vital signs. She will be discharged with prescriptions for Levaquin 500 mg p.o. q.d. x 5 days.  She is advised that Levaquin is an antibiotic in the same class as Cipro and to be wary for potential side effects with the GI tract.  She is given Vicodin 1 or 2 tablets p.o. q.4-6h. p.r.n. pain, 24 tablets without refill.  She will return to our office for followup on Monday, October 05, 2000. DD:  10/02/00 TD:  10/05/00 Job: [id number]

## 2011-05-12 ENCOUNTER — Other Ambulatory Visit (HOSPITAL_BASED_OUTPATIENT_CLINIC_OR_DEPARTMENT_OTHER): Payer: Self-pay | Admitting: Obstetrics and Gynecology

## 2011-05-12 DIAGNOSIS — Z0000 Encounter for general adult medical examination without abnormal findings: Secondary | ICD-10-CM

## 2011-05-19 ENCOUNTER — Other Ambulatory Visit: Payer: Self-pay | Admitting: Obstetrics and Gynecology

## 2011-05-19 DIAGNOSIS — N644 Mastodynia: Secondary | ICD-10-CM

## 2011-05-19 LAB — CBC
HCT: 31.2 — ABNORMAL LOW
HCT: 32.8 — ABNORMAL LOW
HCT: 39
Hemoglobin: 10.6 — ABNORMAL LOW
Hemoglobin: 11.3 — ABNORMAL LOW
MCHC: 33.9
MCHC: 34.5
MCV: 90.5
Platelets: 260
Platelets: 293
RBC: 3.6 — ABNORMAL LOW
RBC: 4.31
RDW: 13.8
RDW: 14
WBC: 12.4 — ABNORMAL HIGH

## 2011-05-19 LAB — URINALYSIS, ROUTINE W REFLEX MICROSCOPIC
Bilirubin Urine: NEGATIVE
Ketones, ur: 15 — AB
Leukocytes, UA: NEGATIVE
Nitrite: NEGATIVE
Protein, ur: NEGATIVE
Urobilinogen, UA: 0.2
pH: 5

## 2011-05-19 LAB — COMPREHENSIVE METABOLIC PANEL
AST: 26
Albumin: 4.1
Alkaline Phosphatase: 69
CO2: 25
Chloride: 102
Creatinine, Ser: 0.81
GFR calc Af Amer: >60
GFR calc non Af Amer: >60
Potassium: 3.6
Total Bilirubin: 1.6 — ABNORMAL HIGH

## 2011-05-19 LAB — BASIC METABOLIC PANEL
BUN: 7
CO2: 28
CO2: 28
Calcium: 8.5
Calcium: 8.8
Chloride: 103
GFR calc Af Amer: >60
Glucose, Bld: 101 — ABNORMAL HIGH
Glucose, Bld: 109 — ABNORMAL HIGH
Potassium: 3.2 — ABNORMAL LOW
Potassium: 3.9
Sodium: 137
Sodium: 140

## 2011-05-19 LAB — DIFFERENTIAL
Basophils Absolute: 0
Basophils Relative: 0
Eosinophils Absolute: 0
Eosinophils Relative: 0
Lymphocytes Relative: 18
Monocytes Absolute: 0.9

## 2011-05-19 LAB — URINE MICROSCOPIC-ADD ON

## 2011-05-20 ENCOUNTER — Ambulatory Visit
Admission: RE | Admit: 2011-05-20 | Discharge: 2011-05-20 | Disposition: A | Discharge status: Home / Self Care | Payer: 59 | Source: Ambulatory Visit | Attending: Obstetrics and Gynecology | Admitting: Obstetrics and Gynecology

## 2011-05-20 DIAGNOSIS — N644 Mastodynia: Secondary | ICD-10-CM

## 2011-05-20 LAB — BASIC METABOLIC PANEL
BUN: 3 — ABNORMAL LOW
Calcium: 9.1
GFR calc non Af Amer: >60
Glucose, Bld: 107 — ABNORMAL HIGH

## 2011-06-08 ENCOUNTER — Other Ambulatory Visit (HOSPITAL_BASED_OUTPATIENT_CLINIC_OR_DEPARTMENT_OTHER): Payer: Self-pay | Admitting: Emergency Medicine

## 2011-06-08 ENCOUNTER — Ambulatory Visit (HOSPITAL_BASED_OUTPATIENT_CLINIC_OR_DEPARTMENT_OTHER): Payer: 59

## 2011-06-08 ENCOUNTER — Other Ambulatory Visit (HOSPITAL_BASED_OUTPATIENT_CLINIC_OR_DEPARTMENT_OTHER): Payer: 59

## 2011-06-08 ENCOUNTER — Inpatient Hospital Stay (HOSPITAL_BASED_OUTPATIENT_CLINIC_OR_DEPARTMENT_OTHER): Admission: RE | Admit: 2011-06-08 | Payer: 59 | Source: Ambulatory Visit

## 2011-06-08 ENCOUNTER — Ambulatory Visit (HOSPITAL_BASED_OUTPATIENT_CLINIC_OR_DEPARTMENT_OTHER)
Admission: RE | Admit: 2011-06-08 | Discharge: 2011-06-08 | Disposition: A | Discharge status: Home / Self Care | Payer: 59 | Source: Ambulatory Visit | Attending: Emergency Medicine | Admitting: Emergency Medicine

## 2011-06-08 ENCOUNTER — Ambulatory Visit (HOSPITAL_BASED_OUTPATIENT_CLINIC_OR_DEPARTMENT_OTHER): Admission: RE | Admit: 2011-06-08 | Payer: 59 | Source: Ambulatory Visit

## 2011-06-08 DIAGNOSIS — M79609 Pain in unspecified limb: Secondary | ICD-10-CM | POA: Insufficient documentation

## 2011-06-08 DIAGNOSIS — R52 Pain, unspecified: Secondary | ICD-10-CM

## 2011-06-08 DIAGNOSIS — M7989 Other specified soft tissue disorders: Secondary | ICD-10-CM

## 2011-06-08 DIAGNOSIS — M25469 Effusion, unspecified knee: Secondary | ICD-10-CM | POA: Insufficient documentation

## 2011-06-13 ENCOUNTER — Ambulatory Visit (HOSPITAL_BASED_OUTPATIENT_CLINIC_OR_DEPARTMENT_OTHER)
Admission: RE | Admit: 2011-06-13 | Discharge: 2011-06-13 | Disposition: A | Discharge status: Home / Self Care | Payer: 59 | Source: Ambulatory Visit | Attending: Obstetrics and Gynecology | Admitting: Obstetrics and Gynecology

## 2011-06-13 DIAGNOSIS — Z0000 Encounter for general adult medical examination without abnormal findings: Secondary | ICD-10-CM

## 2011-06-13 DIAGNOSIS — Z1231 Encounter for screening mammogram for malignant neoplasm of breast: Secondary | ICD-10-CM | POA: Insufficient documentation

## 2011-07-21 ENCOUNTER — Emergency Department (HOSPITAL_COMMUNITY)
Admission: EM | Admit: 2011-07-21 | Discharge: 2011-07-21 | Disposition: A | Discharge status: Home / Self Care | Payer: 59 | Source: Home / Self Care | Attending: Family Medicine | Admitting: Family Medicine

## 2011-07-21 DIAGNOSIS — N390 Urinary tract infection, site not specified: Secondary | ICD-10-CM

## 2011-07-21 HISTORY — DX: Zoster conjunctivitis: B02.31

## 2011-07-21 HISTORY — DX: Gastro-esophageal reflux disease without esophagitis: K21.9

## 2011-07-21 LAB — POCT URINALYSIS DIP (DEVICE)
Bilirubin Urine: NEGATIVE
Ketones, ur: NEGATIVE mg/dL
Protein, ur: NEGATIVE mg/dL
Specific Gravity, Urine: 1.01 (ref 1.005–1.030)
pH: 7 (ref 5.0–8.0)

## 2011-07-21 MED ORDER — PHENAZOPYRIDINE HCL 200 MG PO TABS: 200.0000 mg | ORAL_TABLET | Freq: Three times a day (TID) | ORAL | Status: AC

## 2011-07-21 MED ORDER — SULFAMETHOXAZOLE-TRIMETHOPRIM 800-160 MG PO TABS: 1.0000 | ORAL_TABLET | Freq: Two times a day (BID) | ORAL | Status: AC

## 2011-07-21 NOTE — ED Notes (Signed)
C/o urinary urgency and low abdominal pressure that started last pm.  States she was having bladder spasms and voiding small amt but this was resolved with pyridium.  Denies burning or frequency.

## 2011-07-21 NOTE — ED Provider Notes (Signed)
History     CSN: [id number] Arrival date & time: 07/21/2011  9:02 AM   First MD Initiated Contact with Patient 07/21/11 0911      Chief Complaint  Patient presents with  . Urinary Tract Infection    (Consider location/radiation/quality/duration/timing/severity/associated sxs/prior treatment) HPI Comments: The patient reports urinary frequency and urgency since last pm. Hx of frequent uti. No fever, no back pain. No vaginal discharge. No n/v/c/d. Took a left over pyridium with some relief.   The history is provided by the patient.    Past Medical History  Diagnosis Date  . GERD (gastroesophageal reflux disease)   . Herpes zoster conjunctivitis     Past Surgical History  Procedure Date  . Abdominal hysterectomy   . Knee arthroscopy   . Cholecystectomy     History reviewed. No pertinent family history.  History  Substance Use Topics  . Smoking status: Former Games developer  . Smokeless tobacco: Not on file  . Alcohol Use: No    OB History    Grav Para Term Preterm Abortions TAB SAB Ect Mult Living                  Review of Systems  Constitutional: Negative.   Respiratory: Negative.   Cardiovascular: Negative.   Gastrointestinal: Negative.     Allergies  Ceftin; Levaquin; and Vancomycin  Home Medications   Current Outpatient Rx  Name Route Sig Dispense Refill  . ESOMEPRAZOLE MAGNESIUM 40 MG PO CPDR Oral Take 40 mg by mouth daily before breakfast.      . OMEGA-3 FATTY ACIDS 1000 MG PO CAPS Oral Take 2 g by mouth daily.      Marland Kitchen FLAX SEED OIL PO Oral Take 1,000 mg by mouth daily.      . MULTIVITAMINS PO CAPS Oral Take 1 capsule by mouth daily.      Marland Kitchen PYRIDIUM PO Oral Take by mouth.      Marland Kitchen VALACYCLOVIR HCL 1 G PO TABS Oral Take 1,000 mg by mouth daily. As needed for herpes zoster in eye     . PHENAZOPYRIDINE HCL 200 MG PO TABS Oral Take 1 tablet (200 mg total) by mouth 3 (three) times daily. 6 tablet 0  . SULFAMETHOXAZOLE-TRIMETHOPRIM 800-160 MG PO TABS Oral Take  1 tablet by mouth every 12 (twelve) hours. 10 tablet 0    BP 128/75  Pulse 59  Temp(Src) 97.6 F (36.4 C) (Oral)  Resp 16  SpO2 100%  Physical Exam  Nursing note and vitals reviewed. Constitutional: She appears well-nourished.  Cardiovascular: Normal rate.   Pulmonary/Chest: Effort normal.  Genitourinary:       No flank tenderness    ED Course  Procedures (including critical care time)  Labs Reviewed  POCT URINALYSIS DIP (DEVICE) - Abnormal; Notable for the following:    Glucose, UA 100 (*)    Hgb urine dipstick SMALL (*)    Nitrite POSITIVE (*)    Leukocytes, UA SMALL (*) Biochemical Testing Only. Please order routine urinalysis from main lab if confirmatory testing is needed.   All other components within normal limits  POCT URINALYSIS DIPSTICK  URINE CULTURE   No results found.   1. UTI (lower urinary tract infection)       MDM          Randa Spike, MD 07/21/11 1002

## 2011-07-22 LAB — URINE CULTURE
Colony Count: NO GROWTH
Culture  Setup Time: 201211261839
Culture: NO GROWTH

## 2011-08-28 ENCOUNTER — Encounter: Payer: Self-pay | Admitting: Internal Medicine

## 2011-08-28 ENCOUNTER — Ambulatory Visit (INDEPENDENT_AMBULATORY_CARE_PROVIDER_SITE_OTHER): Payer: 59 | Admitting: Internal Medicine

## 2011-08-28 DIAGNOSIS — M545 Low back pain: Secondary | ICD-10-CM | POA: Insufficient documentation

## 2011-08-28 DIAGNOSIS — J309 Allergic rhinitis, unspecified: Secondary | ICD-10-CM

## 2011-08-28 DIAGNOSIS — K5790 Diverticulosis of intestine, part unspecified, without perforation or abscess without bleeding: Secondary | ICD-10-CM | POA: Insufficient documentation

## 2011-08-28 DIAGNOSIS — B029 Zoster without complications: Secondary | ICD-10-CM

## 2011-08-28 DIAGNOSIS — M129 Arthropathy, unspecified: Secondary | ICD-10-CM

## 2011-08-28 DIAGNOSIS — Z780 Asymptomatic menopausal state: Secondary | ICD-10-CM | POA: Insufficient documentation

## 2011-08-28 DIAGNOSIS — M1990 Unspecified osteoarthritis, unspecified site: Secondary | ICD-10-CM | POA: Insufficient documentation

## 2011-08-28 DIAGNOSIS — T7840XA Allergy, unspecified, initial encounter: Secondary | ICD-10-CM | POA: Insufficient documentation

## 2011-08-28 DIAGNOSIS — K219 Gastro-esophageal reflux disease without esophagitis: Secondary | ICD-10-CM | POA: Insufficient documentation

## 2011-08-28 DIAGNOSIS — B0231 Zoster conjunctivitis: Secondary | ICD-10-CM

## 2011-08-28 DIAGNOSIS — B0233 Zoster keratitis: Secondary | ICD-10-CM

## 2011-08-28 DIAGNOSIS — G8929 Other chronic pain: Secondary | ICD-10-CM

## 2011-08-28 DIAGNOSIS — J40 Bronchitis, not specified as acute or chronic: Secondary | ICD-10-CM

## 2011-08-28 MED ORDER — FLUTICASONE PROPIONATE 50 MCG/ACT NA SUSP: 2.0000 | Freq: Every day | NASAL | Status: DC

## 2011-08-28 MED ORDER — VALACYCLOVIR HCL 1 G PO TABS: 1000.0000 mg | ORAL_TABLET | Freq: Every day | ORAL | Status: DC

## 2011-08-28 MED ORDER — METHYLPREDNISOLONE ACETATE 80 MG/ML IJ SUSP
80.0000 mg | Freq: Once | INTRAMUSCULAR | Status: AC
  Administered 2011-08-28: 80 mg via INTRAMUSCULAR

## 2011-08-28 MED ORDER — CEFTRIAXONE SODIUM 1 G IJ SOLR
1.0000 g | Freq: Once | INTRAMUSCULAR | Status: AC
  Administered 2011-08-28: 1 g via INTRAMUSCULAR

## 2011-08-28 MED ORDER — ALBUTEROL 90 MCG/ACT IN AERS: 2.0000 | INHALATION_SPRAY | RESPIRATORY_TRACT | Status: DC | PRN

## 2011-08-28 NOTE — Progress Notes (Signed)
Subjective:    Patient ID: Kari [name], female    DOB: [birth year], 61 y.o.   MRN: [id number]  HPI  New pt here for first visit.  Former primary Dr. Georgina Peer at [place].   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  Here with acute problem.  4 days ago started with congestion and wheezing.  She is using Albuterol.  Occasionally will wheeze,  She is concerned because she has had RML pneumonia in the past.  No reported fever or chest pain or SOB.  She reports Dr. Alto Denver reviewed a recent CXR and did not see pneumonia  Allergies  Allergen Reactions  . Ceftin Hives  . Levaquin Other (See Comments)    "spacey"  . Vancomycin Hives   Past Medical History  Diagnosis Date  . Herpes zoster conjunctivitis   . Menopause   . GERD (gastroesophageal reflux disease)   . Arthritis   . Diverticulosis   . Allergy    Past Surgical History  Procedure Date  . Abdominal hysterectomy   . Knee arthroscopy   . Cholecystectomy    History   Social History  . Marital Status: Single    Spouse Name: N/A    Number of Children: N/A  . Years of Education: N/A   Occupational History  . Not on file.   Social History Main Topics  . Smoking status: Never Smoker   . Smokeless tobacco: Not on file  . Alcohol Use: No  . Drug Use: No  . Sexually Active: Yes   Other Topics Concern  . Not on file   Social History Narrative  . No narrative on file   Family History  Problem Relation Age of Onset  . Hypertension Mother   . Hyperlipidemia Mother   . Macular degeneration Mother   . Lung cancer Father   . Diabetes Brother   . Breast cancer Paternal Aunt    Patient Active Problem List  Diagnoses  . GERD (gastroesophageal reflux disease)  . Herpes zoster conjunctivitis  . Menopause  . Arthritis  . Diverticulosis  . Allergy  . Chronic low back pain   Current Outpatient  Prescriptions on File Prior to Visit  Medication Sig Dispense Refill  . esomeprazole (NEXIUM) 40 MG capsule Take 40 mg by mouth daily before breakfast.        . Multiple Vitamin (MULTIVITAMIN) capsule Take 1 capsule by mouth daily.         No current facility-administered medications on file prior to visit.        Review of Systems See HPI    Objective:   Physical Exam Physical Exam  Nursing note and vitals reviewed.  Constitutional: She is oriented to person, place, and time. She appears well-developed and well-nourished. She is cooperative.  HENT:  Head: Normocephalic and atraumatic.  Nose: Mucosal edema present.  Eyes: Conjunctivae and EOM are normal. Pupils are equal, round, and reactive to light.  Neck: Neck supple.  Cardiovascular: Regular rhythm, normal heart sounds, intact distal pulses and normal pulses. Exam reveals no gallop and no friction rub.  No murmur heard.  Pulmonary/Chest: She has no wheezes. She has rhonchi. She has no rales. Good air movement Neurological: She is alert and oriented to person, place, and time.  Skin: Skin is warm and dry. No abrasion, no bruising, no ecchymosis and no rash noted.  No cyanosis. Nails show no clubbing.  Psychiatric: She has a normal mood and affect. Her speech is normal and behavior is normal.       Assessment & Plan:  1)  Probable bronchitis  Will treat with Rocephin and Depomedrol in office    Rx Zpak and can continue albuterol .  Pt is to call office on MOnday to report how she is doing  If not better will need CXR 2)  H/o facial /opthalmic zoster.  Gave info sheet regarding Zoster vaccine.  She will check insurance and check with Dr. Dione Booze .    OK to refill Vlatrex 3) Allergic rhinitis  Refill flonase 4   DJD chronic back pain  Dr. Newell Coral 5)  H/O diverticulosis 6)  GERD  Dr. Loreta Ave she has been on Nexium for almost 3 months.  She is to discontinue soon per Dr. Kenna Gilbert instructions

## 2011-08-28 NOTE — Patient Instructions (Signed)
Call office Monday with update on condition  Use albuterol tid as needed  If not better will need CXR  Schedule CPE come in fastng

## 2011-09-02 ENCOUNTER — Telehealth: Payer: Self-pay | Admitting: Emergency Medicine

## 2011-09-02 MED ORDER — AZITHROMYCIN 250 MG PO TABS: ORAL_TABLET | ORAL | Status: AC

## 2011-09-02 NOTE — Telephone Encounter (Signed)
Kari [name] called this afteroon.  She states she is feeling some better, but still not 100%.  She states she still has some congestion and cough.  Would like to know if DDS would be willing to call in Zpak to [facility]?  Aware I will ask and call her back

## 2011-09-02 NOTE — Telephone Encounter (Signed)
Meds escribed per DDS, [name] aware

## 2011-10-06 ENCOUNTER — Telehealth: Payer: Self-pay | Admitting: Emergency Medicine

## 2011-10-06 NOTE — Telephone Encounter (Signed)
Kari [name] called requesting FMLA paperwork be completed for her to keep on file at work for when she gets herpes zoster breakouts of her eye.  She states she has asymetrical breakouts which at its worst, can last up to a week.  She has had a couple of breakouts recently, one she called into work for.  She has used one full Valtrex script and called for a refill to have on hand.  She is in the process of purchasing a new house and is under a tremendous amount of personal stress.  She needs the FMLA paperwork to have on file at work so that she will have excused absences from work when/if she has any additional breakouts.

## 2011-10-07 NOTE — Telephone Encounter (Signed)
Spoke with [name], she is aware of DDS recommendations

## 2011-10-07 NOTE — Telephone Encounter (Signed)
This needs to be handled but her opthalmologist Dr. Dione Booze.  I do not treat opthalmic zoster and if re-curring she needs documentation from an opthalmologist

## 2011-10-31 ENCOUNTER — Encounter (HOSPITAL_BASED_OUTPATIENT_CLINIC_OR_DEPARTMENT_OTHER): Payer: Self-pay | Admitting: *Deleted

## 2011-10-31 ENCOUNTER — Emergency Department (HOSPITAL_BASED_OUTPATIENT_CLINIC_OR_DEPARTMENT_OTHER)
Admission: EM | Admit: 2011-10-31 | Discharge: 2011-10-31 | Disposition: A | Discharge status: Home / Self Care | Payer: 59 | Attending: Emergency Medicine | Admitting: Emergency Medicine

## 2011-10-31 DIAGNOSIS — K219 Gastro-esophageal reflux disease without esophagitis: Secondary | ICD-10-CM | POA: Insufficient documentation

## 2011-10-31 DIAGNOSIS — M7989 Other specified soft tissue disorders: Secondary | ICD-10-CM | POA: Insufficient documentation

## 2011-10-31 DIAGNOSIS — M79609 Pain in unspecified limb: Secondary | ICD-10-CM | POA: Insufficient documentation

## 2011-10-31 DIAGNOSIS — S86119A Strain of other muscle(s) and tendon(s) of posterior muscle group at lower leg level, unspecified leg, initial encounter: Secondary | ICD-10-CM

## 2011-10-31 DIAGNOSIS — M79606 Pain in leg, unspecified: Secondary | ICD-10-CM

## 2011-10-31 MED ORDER — OXYCODONE-ACETAMINOPHEN 5-325 MG PO TABS: 2.0000 | ORAL_TABLET | ORAL | Status: AC | PRN

## 2011-10-31 NOTE — ED Provider Notes (Signed)
History     CSN: [id number]  Arrival date & time 10/31/11  1648   First MD Initiated Contact with Patient 10/31/11 1651      Chief Complaint  Patient presents with  . Leg Pain    (Consider location/radiation/quality/duration/timing/severity/associated sxs/prior treatment) Patient is a 61 y.o. female presenting with leg pain. The history is provided by the patient. No language interpreter was used.  Leg Pain  The incident occurred more than 1 week ago. The incident occurred at home. The injury mechanism is unknown. The pain location is generalized. The quality of the pain is described as aching. The pain is at a severity of 7/10. The pain is moderate. The pain has been constant since onset. Associated symptoms include inability to bear weight and loss of motion. The symptoms are aggravated by activity. She has tried acetaminophen for the symptoms. The treatment provided no relief.  Pt reports she was standing on toes and had a pain in right leg.  Pt complains of bruising and swelling.  Pt unable to tolerate weight bearing.  Pt reports she had a popping sensation in calf.  Pt complains of bruising.  No hx of dvt's.  (Pt reports a similar episode in October,  Pt had a doppler that showed a cyst)  Pt has previously had a ruptured achilles and a right total knee replacement done at [facility]. Pt's Orthopaedist moved.  Past Medical History  Diagnosis Date  . Herpes zoster conjunctivitis   . Menopause   . GERD (gastroesophageal reflux disease)   . Arthritis   . Diverticulosis   . Allergy     Past Surgical History  Procedure Date  . Abdominal hysterectomy   . Knee arthroscopy   . Cholecystectomy     Family History  Problem Relation Age of Onset  . Hypertension Mother   . Hyperlipidemia Mother   . Macular degeneration Mother   . Lung cancer Father   . Diabetes Brother   . Breast cancer Paternal Aunt     History  Substance Use Topics  . Smoking status: Never Smoker   . Smokeless  tobacco: Not on file  . Alcohol Use: No    OB History    Grav Para Term Preterm Abortions TAB SAB Ect Mult Living   1 1              Review of Systems  Cardiovascular: Positive for leg swelling.    Allergies  Ceftin; Levaquin; and Vancomycin  Home Medications   Current Outpatient Rx  Name Route Sig Dispense Refill  . FLUTICASONE PROPIONATE 50 MCG/ACT NA SUSP Nasal Place 2 sprays into the nose daily. In fall/spring 1 g 1  . MULTIVITAMINS PO CAPS Oral Take 1 capsule by mouth daily.      Marland Kitchen FISH OIL 1200 MG PO CAPS Oral Take 2 capsules by mouth daily.      Marland Kitchen PROBIOTIC FORMULA PO Oral Take 1 capsule by mouth daily.      . ALBUTEROL 90 MCG/ACT IN AERS Inhalation Inhale 2 puffs into the lungs every 4 (four) hours as needed. 17 g 0  . ESOMEPRAZOLE MAGNESIUM 40 MG PO CPDR Oral Take 40 mg by mouth daily before breakfast.      . VALACYCLOVIR HCL 1 G PO TABS Oral Take 1 tablet (1,000 mg total) by mouth daily. As needed for herpes zoster in eye 10 tablet 1    BP 155/87  Pulse 83  Temp(Src) 97.7 F (36.5 C) (Oral)  Resp 18  SpO2 100%  Physical Exam  Nursing note and vitals reviewed. Constitutional: She is oriented to person, place, and time. She appears well-developed and well-nourished.  HENT:  Head: Normocephalic and atraumatic.  Musculoskeletal: She exhibits tenderness.       Bruised lower/mid calf,  thompsons test intact,  Bruising to ankle,  Swollen,  Achilles palpable,  Feels intact,  Neurological: She is alert and oriented to person, place, and time. She has normal reflexes.  Skin: Skin is warm.  Psychiatric: She has a normal mood and affect.    ED Course  Procedures (including critical care time)  Labs Reviewed - No data to display No results found.   1. Leg pain     I doubt Dvt.  I spoke to Dr. Margreta Journey he will see on Monday.  Pt placed in splint,  Crutches.  I advised ice and elevate.  Pt given Rx for pain medication.   I think pt has a tear to calf  muscle/gastoc.    MDM          Elson Areas, PA 10/31/11 2220

## 2011-10-31 NOTE — ED Notes (Signed)
Pt amb to triage with slow, steady gait favoring rle. Pt reports pain to right calf pain x Wednesday radiating to her ankle, states ankle is swollen.

## 2011-10-31 NOTE — Discharge Instructions (Signed)
Medial Head Gastrocnemius Tear (Tennis Leg)  with Rehab Medial head gastrocnemius tear, also called tennis leg, is a tear (strain) in a muscle or tendon of the inner portion (medial head) of one of the calf muscles (gastrocnemius). The inner portion of the calf muscle attaches to the thigh bone (femur) and is responsible for bending the knee and straightening the foot (standing on "tippy toes"). Strains are classified into three categories. Grade 1 strains cause pain, but the tendon is not lengthened. Grade 2 strains include a lengthened ligament, due to the ligament being stretched or partially ruptured. With grade 2 strains there is still function, although function may be decreased. Grade 3 strains involve a complete tear of the tendon or muscle, and function is usually impaired. SYMPTOMS   Sudden "pop" or tear felt at the time of injury.   Pain, tenderness, swelling, warmth, or redness over the middle inner calf.   Pain and weakness with ankle motion, especially flexing the ankle against resistance, as well as pain with lifting the foot up (extending the ankle).   Bruising (contusion) of the calf, heel and, sometimes, foot within 48 hours of injury.   Muscle spasm in the calf.  CAUSES  Muscle and ligament strains occur when a force is placed on the muscle or ligament that is greater than it can handle. Common causes of injury include:  Direct hit (trauma) to the calf.   Sudden forceful pushing off or landing on the foot (jumping, landing, serving a tennis ball, lunging).  RISK INCREASES WITH:  Sports that require sudden, explosive calf muscle contraction, such as those involving jumping (basketball), hill running, quick starts (running), or lunging (racquetball, tennis).   Contact sports (football, soccer, hockey).   Poor strength and flexibility.   Previous lower limb injury.  PREVENTION  Warm up and stretch properly before activity.   Allow for adequate recovery between  workouts.   Maintain physical fitness:   Strength, flexibility, and endurance.   Cardiovascular fitness.   Learn and use proper exercise technique.   Complete rehabilitation after lower limb injury, before returning to competition or practice.  PROGNOSIS  If treated properly, tennis leg usually heals within 6 weeks of non-surgical treatment.  RELATED COMPLICATIONS   Longer healing time, if not properly treated or if not given enough time to heal.   Recurring symptoms and injury, if activity is resumed too soon, with overuse, with a direct blow, or with poor technique.   If untreated, may progress to a complete tear (rare) or other injury, due to limping and favoring of the injured leg.   Persistent limping, due to scarring and shortening of the calf muscles, as a result of inadequate rehabilitation.   Prolonged disability.  TREATMENT  Treatment first involves the use of ice and medication to help reduce pain and inflammation. The use of strengthening and stretching exercises may help reduce pain with activity. These exercises may be performed at home or with a therapist. For severe injuries, referral to a therapist may be needed for further evaluation and treatment. Your caregiver may advise that you wear a brace to help healing. Sometimes, crutches are needed until you can walk without limping. Rarely, surgery is needed.  MEDICATION   If pain medicine is needed, nonsteroidal anti-inflammatory medicines (aspirin and ibuprofen), or other minor pain relievers (acetaminophen), are often advised.   Do not take pain medicine for 7 days before surgery.   Prescription pain relievers may be given, if your caregiver thinks they are  needed. Use only as directed and only as much as you need.  HEAT AND COLD  Cold treatment (icing) should be applied for 10 to 15 minutes every 2 to 3 hours for inflammation and pain, and immediately after activity that aggravates your symptoms. Use ice packs or  an ice massage.   Heat treatment may be used before performing stretching and strengthening activities prescribed by your caregiver, physical therapist, or athletic trainer. Use a heat pack or a warm water soak.  SEEK MEDICAL CARE IF:   Symptoms get worse or do not improve in 2 weeks, despite treatment.   Numbness or tingling develops.   New, unexplained symptoms develop. (Drugs used in treatment may produce side effects.)  EXERCISES  RANGE OF MOTION (ROM) AND STRETCHING EXERCISES - Medial Head Gastrocnemius Tear (Tennis Leg) These exercises may help you when beginning to rehabilitate your injury. Your symptoms may resolve with or without further involvement from your physician, physical therapist or athletic trainer. While completing these exercises, remember:   Restoring tissue flexibility helps normal motion to return to the joints. This allows healthier, less painful movement and activity.   An effective stretch should be held for at least 30 seconds.   A stretch should never be painful. You should only feel a gentle lengthening or release in the stretched tissue.  STRETCH - Gastrocsoleus  Sit with your right / left leg extended. Holding onto both ends of a belt or towel, loop it around the ball of your foot.   Keeping your right / left ankle and foot relaxed and your knee straight, pull your foot and ankle toward you using the belt.   You should feel a gentle stretch behind your calf or knee. Hold this position for __________ seconds.  Repeat __________ times. Complete this stretch __________ times per day.  RANGE OF MOTION - Ankle Dorsiflexion, Active Assisted   Remove your shoes and sit on a chair, preferably not on a carpeted surface.   Place your right / left foot directly under the knee. Extend your opposite leg for support.   Keeping your heel down, slide your right / left foot back toward the chair, until you feel a stretch at your ankle or calf. If you do not feel a  stretch, slide your bottom forward to the edge of the chair, while still keeping your heel down.   Hold this stretch for __________ seconds.  Repeat __________ times. Complete this stretch __________ times per day.  STRETCH - Gastroc, Standing   Place your hands on a wall.   Extend your right / left leg behind you, keeping the front knee somewhat bent.   Slightly point your toes inward on your back foot.   Keeping your right / left heel on the floor and your knee straight, shift your weight toward the wall, not allowing your back to arch.   You should feel a gentle stretch in the right / left calf. Hold this position for __________ seconds.  Repeat __________ times. Complete this stretch __________ times per day. STRETCH - Soleus, Standing   Place your hands on a wall.   Extend your right / left leg behind you, keeping the other knee somewhat bent.   Slightly point your toes inward on your back foot.   Keep your right / left heel on the floor, bend your back knee, and slightly shift your weight over the back leg so that you feel a gentle stretch deep in your back calf.  Hold this position for __________ seconds.  Repeat __________ times. Complete this stretch __________ times per day. STRETCH - Gastrocsoleus, Standing Note: This exercise can place a lot of stress on your foot and ankle. Please complete this exercise only if specifically instructed by your caregiver.   Place the ball of your right / left foot on a step, keeping your other foot firmly on the same step.   Hold on to the wall or a rail for balance.   Slowly lift your other foot, allowing your body weight to press your heel down over the edge of the step.   You should feel a stretch in your right / left calf.   Hold this position for __________ seconds.   Repeat this exercise with a slight bend in your right / left knee.  Repeat __________ times. Complete this stretch __________ times per day.  STRENGTHENING  EXERCISES - Medial Head Gastrocnemius Tear (Tennis Leg) These exercises may help you when beginning to rehabilitate your injury. They may resolve your symptoms with or without further involvement from your physician, physical therapist or athletic trainer. While completing these exercises, remember:   Muscles can gain both the endurance and the strength needed for everyday activities through controlled exercises.   Complete these exercises as instructed by your physician, physical therapist or athletic trainer. Increase the resistance and repetitions only as guided by your caregiver.  STRENGTH - Plantar-flexors  Sit with your right / left leg extended. Holding onto both ends of a rubber exercise band or tubing, loop it around the ball of your foot. Keep a slight tension in the band.   Slowly push your toes away from you, pointing them downward.   Hold this position for __________ seconds. Return slowly, controlling the tension in the band.  Repeat __________ times. Complete this exercise __________ times per day.  STRENGTH - Plantar-flexors  Stand with your feet shoulder width apart. Steady yourself with a wall or table, using as little support as needed.   Keeping your weight evenly spread over the width of your feet, rise up on your toes.*   Hold this position for __________ seconds.  Repeat __________ times. Complete this exercise __________ times per day.  *If this is too easy, shift your weight toward your right / left leg until you feel challenged. Ultimately, you may be asked to do this exercise while standing on your right / left foot only. STRENGTH - Plantar-flexors, Eccentric Note: This exercise can place a lot of stress on your foot and ankle. Please complete this exercise only if specifically instructed by your caregiver.   Place the balls of your feet on a step. With your hands, use only enough support from a wall or rail to keep your balance.   Keep your knees straight and  rise up on your toes.   Slowly shift your weight entirely to your right / left toes and pick up your opposite foot. Gently and with controlled movement, lower your weight through your right / left foot so that your heel drops below the level of the step. You will feel a slight stretch in the back of your right / left calf.   Use the healthy leg to help rise up onto the balls of both feet, then lower weight only onto the right / left leg again. Build up to 15 repetitions. Then progress to 3 sets of 15 repetitions.*   After completing the above exercise, complete the same exercise with a slight knee bend (  about 30 degrees). Again, build up to 15 repetitions. Then progress to 3 sets of 15 repetitions.*  Perform this exercise __________ times per day.  *When you easily complete 3 sets of 15, your physician, physical therapist or athletic trainer may advise you to add resistance, by wearing a backpack filled with additional weight. Document Released: 08/11/2005 Document Revised: 07/31/2011 Document Reviewed: 11/23/2008 [facility] Patient Information 2012 [place], [place].

## 2011-10-31 NOTE — ED Notes (Signed)
Called orthro direct--will call back on [phone]

## 2011-11-01 NOTE — ED Provider Notes (Signed)
Medical screening examination/treatment/procedure(s) were performed by non-physician practitioner and as supervising physician I was immediately available for consultation/collaboration.  Cainan Trull, MD 11/01/11 0016 

## 2011-11-04 ENCOUNTER — Other Ambulatory Visit: Payer: Self-pay | Admitting: Internal Medicine

## 2011-11-05 NOTE — Telephone Encounter (Signed)
Kari [name]  Don't know if this was a pt request of pharmacy.  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

## 2011-11-07 ENCOUNTER — Other Ambulatory Visit: Payer: Self-pay | Admitting: Internal Medicine

## 2011-11-09 NOTE — Telephone Encounter (Signed)
I think you may have done this already but let Ginny know that this needs to be re-ordered by her opthalmologist

## 2011-11-11 ENCOUNTER — Telehealth: Payer: Self-pay | Admitting: Internal Medicine

## 2011-11-11 DIAGNOSIS — B029 Zoster without complications: Secondary | ICD-10-CM

## 2011-11-11 MED ORDER — VALACYCLOVIR HCL 1 G PO TABS: 1000.0000 mg | ORAL_TABLET | Freq: Every day | ORAL | Status: DC

## 2011-11-11 NOTE — Telephone Encounter (Signed)
Pt needs to get her ValACYclovir HCl (Tab) VALTREX 1000 MG Take 1 tablet (1,000 mg total) by mouth daily. As needed for herpes zoster in eye  She states she is under a lot of stress and needs her medicine because now the results are the mouth sores in side her mouth. Please call her so she can explain it you; pt [phone]

## 2011-11-11 NOTE — Telephone Encounter (Signed)
Spoke with Kari [name].  She states that she has been under a lot of stress- she purchased a home, doing renovations, then has been out of work due to an injury.  Right now, she has fever blisters around her mouth.  She is not currently having any problems around her eyes at all and has not at all in years.  She states any time she gets very stressed like she is, she generally gets the fever blisters, not the problems with her eyes like the HSV first began years ago.  She states she is completely out of Valtrex, needs a refill today if at all possible.  Aware if she has any problems with the HSV in or around her eyes, she will need to see her ophthalmologist, but I will ask DDS for refill of Valtrex for fever blisters around her mouth.

## 2011-11-12 NOTE — Telephone Encounter (Signed)
As discussed Tuesday afternoon  OK to refill Valtrex

## 2012-02-04 ENCOUNTER — Other Ambulatory Visit: Payer: Self-pay | Admitting: Internal Medicine

## 2012-03-03 ENCOUNTER — Encounter: Payer: Self-pay | Admitting: Internal Medicine

## 2012-03-03 DIAGNOSIS — K5792 Diverticulitis of intestine, part unspecified, without perforation or abscess without bleeding: Secondary | ICD-10-CM | POA: Insufficient documentation

## 2012-05-10 ENCOUNTER — Emergency Department (INDEPENDENT_AMBULATORY_CARE_PROVIDER_SITE_OTHER): Payer: 59

## 2012-05-10 ENCOUNTER — Encounter: Payer: Self-pay | Admitting: *Deleted

## 2012-05-10 ENCOUNTER — Emergency Department
Admission: EM | Admit: 2012-05-10 | Discharge: 2012-05-10 | Disposition: A | Discharge status: Home / Self Care | Payer: 59 | Source: Home / Self Care

## 2012-05-10 DIAGNOSIS — J069 Acute upper respiratory infection, unspecified: Secondary | ICD-10-CM

## 2012-05-10 DIAGNOSIS — R05 Cough: Secondary | ICD-10-CM

## 2012-05-10 DIAGNOSIS — J40 Bronchitis, not specified as acute or chronic: Secondary | ICD-10-CM

## 2012-05-10 MED ORDER — METHYLPREDNISOLONE SODIUM SUCC 125 MG IJ SOLR
125.0000 mg | Freq: Once | INTRAMUSCULAR | Status: AC
  Administered 2012-05-10: 125 mg via INTRAMUSCULAR

## 2012-05-10 NOTE — ED Provider Notes (Signed)
History     CSN: [id number]  Arrival date & time 05/10/12  1657   First MD Initiated Contact with Patient 05/10/12 1702      Chief Complaint  Patient presents with  . Cough   HPI URI Symptoms Onset: 5-7 days  Description: initially with rhinorrhea and nasal congestion, now with persistent cough and wheezing at night.  Modifying factors:  Hx/o PNA approx 4 years ago that presented in similar fashion.   Symptoms Nasal discharge: no Fever: chills Sore throat: no Cough: yes Wheezing: yes Ear pain: no GI symptoms: no Sick contacts: yes; works in [employer]  Stiff neck: no Dyspnea: no Rash: no Swallowing difficulty: no  Sinusitis Risk Factors Headache/face pain: no Double sickening: no tooth pain: no  Allergy Risk Factors Sneezing: no Itchy scratchy throat: no Seasonal symptoms: no  Flu Risk Factors Headache: no muscle aches: no severe fatigue: no   Past Medical History  Diagnosis Date  . Herpes zoster conjunctivitis   . Menopause   . GERD (gastroesophageal reflux disease)   . Arthritis   . Diverticulosis   . Allergy   . Diabetes mellitus   . Hypertension     Past Surgical History  Procedure Date  . Abdominal hysterectomy   . Knee arthroscopy   . Cholecystectomy   . Tonsillectomy     Family History  Problem Relation Age of Onset  . Hypertension Mother   . Hyperlipidemia Mother   . Macular degeneration Mother   . Lung cancer Father   . Cancer Father     lung  . Diabetes Brother   . Breast cancer Paternal Aunt     History  Substance Use Topics  . Smoking status: Never Smoker   . Smokeless tobacco: Not on file  . Alcohol Use: No    OB History    Grav Para Term Preterm Abortions TAB SAB Ect Mult Living   1 1              Review of Systems  All other systems reviewed and are negative.    Allergies  Ceftin; Levofloxacin; and Vancomycin  Home Medications   Current Outpatient Rx  Name Route Sig Dispense Refill  . ASPIRIN  81 MG PO TABS Oral Take 81 mg by mouth daily.    . ALBUTEROL 90 MCG/ACT IN AERS Inhalation Inhale 2 puffs into the lungs every 4 (four) hours as needed. 17 g 0  . ESOMEPRAZOLE MAGNESIUM 40 MG PO CPDR Oral Take 40 mg by mouth daily before breakfast.      . FLUTICASONE PROPIONATE 50 MCG/ACT NA SUSP Nasal Place 2 sprays into the nose daily. In fall/spring 1 g 1  . MULTIVITAMINS PO CAPS Oral Take 1 capsule by mouth daily.      Marland Kitchen FISH OIL 1200 MG PO CAPS Oral Take 2 capsules by mouth daily.      Marland Kitchen PROBIOTIC FORMULA PO Oral Take 1 capsule by mouth daily.      Marland Kitchen VALACYCLOVIR HCL 1 G PO TABS  TAKE 1 TABLET BY MOUTH DAILY. 10 tablet 1    BP 117/79  Pulse 61  Temp 98.1 F (36.7 C) (Oral)  Resp 18  Ht 5\' 8"  (1.727 m)  Wt 165 lb (74.844 kg)  BMI 25.09 kg/m2  SpO2 100%  Physical Exam  Constitutional: She appears well-developed and well-nourished.  HENT:  Head: Normocephalic and atraumatic.  Eyes: Conjunctivae normal are normal. Pupils are equal, round, and reactive to light.  Neck: Normal range of motion. Neck supple.  Cardiovascular: Normal rate and regular rhythm.   Pulmonary/Chest: Effort normal. She has wheezes. She has rales.  Abdominal: Soft. Bowel sounds are normal.  Musculoskeletal: Normal range of motion.  Lymphadenopathy:    She has no cervical adenopathy.  Neurological: She is alert.  Skin: Skin is warm.    ED Course  Procedures (including critical care time)  Labs Reviewed - No data to display No results found.   1. URI (upper respiratory infection)   2. Bronchitis       MDM  Will treat with prednisone and azithromycin.  Solumedrol 125 mg IM x 1.  IS for increased pulmonary compliance in setting of hx/o PNA  Discussed resp and infectious red flags.  Follow up as needed.      The patient and/or caregiver has been counseled thoroughly with regard to treatment plan and/or medications prescribed including dosage, schedule, interactions, rationale for use, and  possible side effects and they verbalize understanding. Diagnoses and expected course of recovery discussed and will return if not improved as expected or if the condition worsens. Patient and/or caregiver verbalized understanding.             Doree Albee, MD 05/10/12 [phone]

## 2012-05-10 NOTE — ED Notes (Signed)
Pt c/o cough and wheezing x 3 days. Denies fever. She has used flonase with no relief. She has a hx of pneumonia.

## 2012-05-19 ENCOUNTER — Other Ambulatory Visit (HOSPITAL_BASED_OUTPATIENT_CLINIC_OR_DEPARTMENT_OTHER): Payer: Self-pay | Admitting: Obstetrics and Gynecology

## 2012-05-19 DIAGNOSIS — Z0000 Encounter for general adult medical examination without abnormal findings: Secondary | ICD-10-CM

## 2012-06-16 ENCOUNTER — Ambulatory Visit (HOSPITAL_BASED_OUTPATIENT_CLINIC_OR_DEPARTMENT_OTHER)
Admission: RE | Admit: 2012-06-16 | Discharge: 2012-06-16 | Disposition: A | Discharge status: Home / Self Care | Payer: 59 | Source: Ambulatory Visit | Attending: Obstetrics and Gynecology | Admitting: Obstetrics and Gynecology

## 2012-06-16 DIAGNOSIS — Z0000 Encounter for general adult medical examination without abnormal findings: Secondary | ICD-10-CM

## 2012-06-16 DIAGNOSIS — Z1231 Encounter for screening mammogram for malignant neoplasm of breast: Secondary | ICD-10-CM | POA: Insufficient documentation

## 2012-08-25 DIAGNOSIS — J189 Pneumonia, unspecified organism: Secondary | ICD-10-CM

## 2012-08-25 HISTORY — DX: Pneumonia, unspecified organism: J18.9

## 2012-09-18 ENCOUNTER — Telehealth: Payer: Self-pay | Admitting: Internal Medicine

## 2012-09-18 MED ORDER — DICYCLOMINE HCL 20 MG PO TABS: 20.0000 mg | ORAL_TABLET | Freq: Four times a day (QID) | ORAL | Status: DC | PRN

## 2012-09-18 NOTE — Telephone Encounter (Signed)
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 Last had in July. Some ? Diverticulitis in past. Has had 24 hrs chills, crampy LLQ pain and watery diarrhea w/o blood.  Has started leftover cipro and metronidazole which has helped this same thing in past.  No fever. No nausea/vomiting.  We have decided she will use antibiotics, BRAT diet and I will Rx dicyclomine. She is to contact Dr. Kenna Gilbert office Monday 1/27 and call back me prn this weekend.

## 2012-09-23 ENCOUNTER — Other Ambulatory Visit: Payer: Self-pay | Admitting: Gastroenterology

## 2012-09-23 DIAGNOSIS — K5790 Diverticulosis of intestine, part unspecified, without perforation or abscess without bleeding: Secondary | ICD-10-CM

## 2012-09-23 DIAGNOSIS — R1032 Left lower quadrant pain: Secondary | ICD-10-CM

## 2012-09-24 ENCOUNTER — Encounter (HOSPITAL_BASED_OUTPATIENT_CLINIC_OR_DEPARTMENT_OTHER): Payer: Self-pay

## 2012-09-24 ENCOUNTER — Ambulatory Visit (HOSPITAL_BASED_OUTPATIENT_CLINIC_OR_DEPARTMENT_OTHER)
Admission: RE | Admit: 2012-09-24 | Discharge: 2012-09-24 | Disposition: A | Discharge status: Home / Self Care | Payer: 59 | Source: Ambulatory Visit | Attending: Gastroenterology | Admitting: Gastroenterology

## 2012-09-24 DIAGNOSIS — K5732 Diverticulitis of large intestine without perforation or abscess without bleeding: Secondary | ICD-10-CM | POA: Insufficient documentation

## 2012-09-24 DIAGNOSIS — K5790 Diverticulosis of intestine, part unspecified, without perforation or abscess without bleeding: Secondary | ICD-10-CM

## 2012-09-24 DIAGNOSIS — R1032 Left lower quadrant pain: Secondary | ICD-10-CM

## 2012-09-24 MED ORDER — IOHEXOL 300 MG/ML  SOLN
100.0000 mL | Freq: Once | INTRAMUSCULAR | Status: AC | PRN
  Administered 2012-09-24: 100 mL via INTRAVENOUS

## 2012-10-06 ENCOUNTER — Emergency Department
Admission: EM | Admit: 2012-10-06 | Discharge: 2012-10-06 | Disposition: A | Discharge status: Home / Self Care | Payer: 59 | Source: Home / Self Care | Attending: Family Medicine | Admitting: Family Medicine

## 2012-10-06 ENCOUNTER — Encounter: Payer: Self-pay | Admitting: *Deleted

## 2012-10-06 DIAGNOSIS — J329 Chronic sinusitis, unspecified: Secondary | ICD-10-CM

## 2012-10-06 MED ORDER — AZITHROMYCIN 250 MG PO TABS: ORAL_TABLET | ORAL | Status: DC

## 2012-10-06 NOTE — ED Notes (Signed)
[place] c/o right sided sinus pain, HA, right ear pain and chills x 2-3 days.  No drainage. She recently finished Cipro and flagyl for Norovirus.

## 2012-10-06 NOTE — ED Provider Notes (Signed)
History     CSN: [id number]  Arrival date & time 10/06/12  1207   First MD Initiated Contact with Patient 10/06/12 1221      Chief Complaint  Patient presents with  . Sinus Problem   HPI  SINUSITIS Onset:  2-3 days  Location: R maxilla Description:R sided sinus pain and pressure  Modifying factors: finished extended course of cipro/flagyl for diverticulitis 3 days ago.   Symptoms Cough:  no Discharge:  mild Fever: no Sinus Pressure:  yes Ears Blocked:  yes Teeth Ache:  mild Frontal Headache:  yes Second Sickening:  yes  Red Flags Change in mental state: no Change in vision: no    Past Medical History  Diagnosis Date  . Herpes zoster conjunctivitis   . Menopause   . GERD (gastroesophageal reflux disease)   . Arthritis   . Diverticulosis   . Allergy     Past Surgical History  Procedure Laterality Date  . Abdominal hysterectomy    . Knee arthroscopy    . Cholecystectomy    . Tonsillectomy      Family History  Problem Relation Age of Onset  . Hypertension Mother   . Hyperlipidemia Mother   . Macular degeneration Mother   . Lung cancer Father   . Cancer Father     lung  . Diabetes Brother   . Breast cancer Paternal Aunt     History  Substance Use Topics  . Smoking status: Never Smoker   . Smokeless tobacco: Not on file  . Alcohol Use: No    OB History   Grav Para Term Preterm Abortions TAB SAB Ect Mult Living   1 1              Review of Systems  All other systems reviewed and are negative.    Allergies  Ceftin; Levofloxacin; and Vancomycin  Home Medications   Current Outpatient Rx  Name  Route  Sig  Dispense  Refill  . albuterol (PROVENTIL,VENTOLIN) 90 MCG/ACT inhaler   Inhalation   Inhale 2 puffs into the lungs every 4 (four) hours as needed.   17 g   0   . aspirin 81 MG tablet   Oral   Take 81 mg by mouth daily.         Marland Kitchen azithromycin (ZITHROMAX) 250 MG tablet      Take 2 tabs PO x 1 dose, then 1 tab PO QD x 4  days   6 tablet   0   . dicyclomine (BENTYL) 20 MG tablet   Oral   Take 1 tablet (20 mg total) by mouth every 6 (six) hours as needed (abdominal pain, cramps).   60 tablet   0   . esomeprazole (NEXIUM) 40 MG capsule   Oral   Take 40 mg by mouth daily before breakfast.           . fluticasone (FLONASE) 50 MCG/ACT nasal spray   Nasal   Place 2 sprays into the nose daily. In fall/spring   1 g   1   . Multiple Vitamin (MULTIVITAMIN) capsule   Oral   Take 1 capsule by mouth daily.           . Omega-3 Fatty Acids (FISH OIL) 1200 MG CAPS   Oral   Take 2 capsules by mouth daily.           . Probiotic Product (PROBIOTIC FORMULA PO)   Oral   Take 1 capsule by mouth daily.           Marland Kitchen  valACYclovir (VALTREX) 1000 MG tablet      TAKE 1 TABLET BY MOUTH DAILY.   10 tablet   1     BP 131/83  Pulse 61  Temp(Src) 97.6 F (36.4 C) (Oral)  Ht 5\' 8"  (1.727 m)  Wt 171 lb (77.565 kg)  BMI 26.01 kg/m2  SpO2 99%  Physical Exam  Constitutional: She appears well-developed and well-nourished.  HENT:  Head: Normocephalic and atraumatic.  Right Ear: External ear normal.  Left Ear: External ear normal.  + r sided maxillary TTP    Eyes: Conjunctivae and EOM are normal. Pupils are equal, round, and reactive to light.  Neck: Normal range of motion.  Cardiovascular: Normal rate, regular rhythm and normal heart sounds.   Pulmonary/Chest: Effort normal and breath sounds normal.  Abdominal: Soft.  Musculoskeletal: Normal range of motion.  Lymphadenopathy:    She has no cervical adenopathy.  Neurological: She is alert.  Skin: Skin is warm.    ED Course  Procedures (including critical care time)  Labs Reviewed - No data to display No results found.   1. Sinusitis       MDM  Will treat with zpak (cephalosporin allergy) There is some allergic overlap. Restart zyrtec and flonase.  Discussed infectious, ENT, and GI red flags. Higher risk for abx assd diarrhea/cdiff.  Discussed red flags for this.  Follow up as needed.     The patient and/or caregiver has been counseled thoroughly with regard to treatment plan and/or medications prescribed including dosage, schedule, interactions, rationale for use, and possible side effects and they verbalize understanding. Diagnoses and expected course of recovery discussed and will return if not improved as expected or if the condition worsens. Patient and/or caregiver verbalized understanding.              Doree Albee, MD 10/06/12 [phone]

## 2012-10-08 ENCOUNTER — Other Ambulatory Visit: Payer: Self-pay | Admitting: Internal Medicine

## 2012-10-11 NOTE — Telephone Encounter (Signed)
Refill request

## 2012-10-15 ENCOUNTER — Other Ambulatory Visit: Payer: Self-pay | Admitting: Internal Medicine

## 2012-10-15 ENCOUNTER — Telehealth: Payer: Self-pay | Admitting: *Deleted

## 2012-10-15 MED ORDER — LEVOCETIRIZINE DIHYDROCHLORIDE 5 MG PO TABS: 5.0000 mg | ORAL_TABLET | Freq: Every evening | ORAL | Status: DC

## 2012-10-15 NOTE — Telephone Encounter (Signed)
Pt states that she would like to try xyzal  She hs not had any relief with OTC meds [facility] pharmacy sent the request

## 2012-10-27 ENCOUNTER — Encounter: Payer: Self-pay | Admitting: *Deleted

## 2013-02-14 ENCOUNTER — Ambulatory Visit (INDEPENDENT_AMBULATORY_CARE_PROVIDER_SITE_OTHER): Payer: 59 | Admitting: Family Medicine

## 2013-02-14 ENCOUNTER — Telehealth: Payer: Self-pay | Admitting: *Deleted

## 2013-02-14 ENCOUNTER — Encounter: Payer: Self-pay | Admitting: Family Medicine

## 2013-02-14 VITALS — BP 159/84 | HR 58 | Temp 98.1°F | Resp 18

## 2013-02-14 DIAGNOSIS — I498 Other specified cardiac arrhythmias: Secondary | ICD-10-CM

## 2013-02-14 DIAGNOSIS — B0231 Zoster conjunctivitis: Secondary | ICD-10-CM

## 2013-02-14 DIAGNOSIS — B0233 Zoster keratitis: Secondary | ICD-10-CM

## 2013-02-14 DIAGNOSIS — R001 Bradycardia, unspecified: Secondary | ICD-10-CM

## 2013-02-14 MED ORDER — VALACYCLOVIR HCL 500 MG PO TABS: 500.0000 mg | ORAL_TABLET | Freq: Every day | ORAL | Status: DC

## 2013-02-14 NOTE — Progress Notes (Signed)
  Subjective:    Patient ID: Kari [name], female    DOB: Oct 17, 1950, 62 y.o.   MRN: [id number]   HPI  Kari [name] is here today to discuss her recent episodes of bradycardia.  She is a patient of Dr. Constance Goltz.  She says that she has recently noticed on a couple of occasions that her heartrate has been slower than usual.  Her pulse normally runs in the low 50s.  It has been in the 40s recently and this morning she noted by a couple of testing methods that her pulse had dropped to 37.  She did not have any abnormal symptoms at that time.  Her only complaint is that she has been feeling a little more fatigued than usual but is concerned about this drop in her pulse.     Review of Systems  Constitutional: Positive for fatigue.  Respiratory: Negative for chest tightness and shortness of breath.   Cardiovascular: Negative for chest pain, palpitations and leg swelling.       Low Pulse   Skin: Negative.   Neurological: Negative for dizziness, weakness and light-headedness.    Past Medical History  Diagnosis Date  . Herpes zoster conjunctivitis   . Menopause   . GERD (gastroesophageal reflux disease)   . Arthritis   . Diverticulosis   . Allergy     Family History  Problem Relation Age of Onset  . Hypertension Mother   . Hyperlipidemia Mother   . Macular degeneration Mother   . COPD Mother   . Lung cancer Father   . Cancer Father     Lung  . COPD Father   . Diabetes Brother   . Breast cancer Paternal Aunt     History   Social History  . Marital Status: Married    Spouse Name: N/A    Number of Children: N/A  . Years of Education: N/A   Occupational History  . Not on file.   Social History Main Topics  . Smoking status: Never Smoker   . Smokeless tobacco: Not on file  . Alcohol Use: No  . Drug Use: No  . Sexually Active: Yes   Other Topics Concern  . Not on file   Social History Narrative  . No narrative on file        Objective:   Physical Exam   Constitutional: She is oriented to person, place, and time. She appears well-nourished. No distress.  HENT:  Head: Atraumatic.  Eyes: Left eye exhibits no discharge. No scleral icterus.  Neck: Neck supple. No thyromegaly present.  Cardiovascular: Regular rhythm.  Exam reveals no gallop and no friction rub.   No murmur heard. Bradycardic   Pulmonary/Chest: Effort normal and breath sounds normal. No respiratory distress.  Musculoskeletal: She exhibits no edema.  Lymphadenopathy:    She has no cervical adenopathy.  Neurological: She is alert and oriented to person, place, and time.  Skin: Skin is warm and dry.  Psychiatric: She has a normal mood and affect.        Assessment & Plan:

## 2013-02-15 ENCOUNTER — Encounter: Payer: Self-pay | Admitting: *Deleted

## 2013-02-15 ENCOUNTER — Other Ambulatory Visit: Payer: Self-pay | Admitting: *Deleted

## 2013-02-15 ENCOUNTER — Encounter (INDEPENDENT_AMBULATORY_CARE_PROVIDER_SITE_OTHER): Payer: 59

## 2013-02-15 ENCOUNTER — Encounter: Payer: Self-pay | Admitting: Internal Medicine

## 2013-02-15 DIAGNOSIS — I498 Other specified cardiac arrhythmias: Secondary | ICD-10-CM

## 2013-02-15 DIAGNOSIS — R5381 Other malaise: Secondary | ICD-10-CM

## 2013-02-15 DIAGNOSIS — R001 Bradycardia, unspecified: Secondary | ICD-10-CM

## 2013-02-15 DIAGNOSIS — R5383 Other fatigue: Secondary | ICD-10-CM

## 2013-02-15 NOTE — Progress Notes (Signed)
Patient ID: Kari [name], female   DOB: [birth year], 62 y.o.   MRN: [id number] Evo 48 Hour Holter monitor applied to patient.  Patient noted to be in Mobitz II, 2:1 AV block during application of 48 hour monitor.  EKG and rhythm strip performed and brought to Dennis Bast, RN and Olga Millers, MD.  Patient to be scheduled to see Sharrell Ku, MD on 02/16/2013.

## 2013-02-16 ENCOUNTER — Ambulatory Visit (HOSPITAL_COMMUNITY): Payer: 59 | Attending: Cardiovascular Disease | Admitting: Radiology

## 2013-02-16 ENCOUNTER — Encounter: Payer: Self-pay | Admitting: *Deleted

## 2013-02-16 ENCOUNTER — Other Ambulatory Visit: Payer: Self-pay | Admitting: *Deleted

## 2013-02-16 ENCOUNTER — Telehealth: Payer: Self-pay | Admitting: *Deleted

## 2013-02-16 ENCOUNTER — Ambulatory Visit (INDEPENDENT_AMBULATORY_CARE_PROVIDER_SITE_OTHER): Payer: 59 | Admitting: Internal Medicine

## 2013-02-16 ENCOUNTER — Encounter: Payer: Self-pay | Admitting: Internal Medicine

## 2013-02-16 ENCOUNTER — Encounter (HOSPITAL_COMMUNITY): Payer: Self-pay | Admitting: Pharmacy Technician

## 2013-02-16 VITALS — BP 152/77 | HR 53 | Ht 68.0 in | Wt 166.4 lb

## 2013-02-16 DIAGNOSIS — I441 Atrioventricular block, second degree: Secondary | ICD-10-CM

## 2013-02-16 DIAGNOSIS — I498 Other specified cardiac arrhythmias: Secondary | ICD-10-CM | POA: Insufficient documentation

## 2013-02-16 DIAGNOSIS — I495 Sick sinus syndrome: Secondary | ICD-10-CM

## 2013-02-16 DIAGNOSIS — J45909 Unspecified asthma, uncomplicated: Secondary | ICD-10-CM | POA: Insufficient documentation

## 2013-02-16 MED ORDER — SODIUM CHLORIDE 0.9 % IR SOLN
80.0000 mg | Status: DC
  Filled 2013-02-16: qty 2

## 2013-02-16 MED ORDER — CHLORHEXIDINE GLUCONATE 4 % EX LIQD
60.0000 mL | Freq: Once | CUTANEOUS | Status: DC
  Filled 2013-02-16: qty 60

## 2013-02-16 MED ORDER — SODIUM CHLORIDE 0.9 % IV SOLN
INTRAVENOUS | Status: DC
  Administered 2013-02-17: 06:00:00 via INTRAVENOUS

## 2013-02-16 NOTE — Assessment & Plan Note (Signed)
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  There is no obvious etiology. She will undergo 2-D echocardiography to rule out evidence of structural heart disease. If her left ventricular function is normal, I would recommend proceeding with insertion of a permanent pacemaker. The risk, goals, benefits, and expectations of the procedure have been discussed in detail with the patient and she wishes to proceed.

## 2013-02-16 NOTE — Patient Instructions (Addendum)

## 2013-02-16 NOTE — Progress Notes (Signed)
HPI Kari [name] is referred today for evaluation of bradycardia. She is a 62 yo woman with a h/o asthma, who has been found to have symptomatic 2:1 AV block and is referred for evaluation. She has never had syncope. She denies chest pain or shortness of breath. She has had episodes where she was fatigued and weak and could not walk up a hill. She checked her pulse, and it was in the low 40s. She says when he underwent medical evaluation where she was found to have 2-1 heart block with a ventricular rate of 40 beats per minute. The patient denies anginal symptoms. She has not had chest pain. A recent chest x-ray demonstrated no infiltrates. No peripheral edema or other systemic illnesses. Allergies  Allergen Reactions  . Ceftin Hives  . Levofloxacin Other (See Comments)    "spacey"  . Vancomycin Hives     Current Outpatient Prescriptions  Medication Sig Dispense Refill  . albuterol (PROVENTIL,VENTOLIN) 90 MCG/ACT inhaler Inhale 2 puffs into the lungs every 4 (four) hours as needed.  17 g  0  . aspirin 81 MG tablet Take 81 mg by mouth daily.      . fluticasone (FLONASE) 50 MCG/ACT nasal spray PLACE 2 SPRAYS INTO THE NOSE DAILY. IN FALL/SPRING  16 g  1  . levocetirizine (XYZAL) 5 MG tablet Take 1 tablet (5 mg total) by mouth every evening.  30 tablet  3  . Multiple Vitamin (MULTIVITAMIN) capsule Take 1 capsule by mouth daily.        . Omega-3 Fatty Acids (FISH OIL) 1200 MG CAPS Take 2 capsules by mouth daily.        . Probiotic Product (PROBIOTIC FORMULA PO) Take 1 capsule by mouth daily.        . valACYclovir (VALTREX) 1000 MG tablet TAKE 1 TABLET BY MOUTH DAILY.  10 tablet  1  . valACYclovir (VALTREX) 500 MG tablet Take 1 tablet (500 mg total) by mouth daily.  90 tablet  3   No current facility-administered medications for this visit.     Past Medical History  Diagnosis Date  . Herpes zoster conjunctivitis   . Menopause   . GERD (gastroesophageal reflux disease)   . Arthritis   .  Diverticulosis   . Allergy     ROS:   All systems reviewed and negative except as noted in the HPI.   Past Surgical History  Procedure Laterality Date  . Abdominal hysterectomy    . Knee arthroscopy    . Cholecystectomy    . Tonsillectomy       Family History  Problem Relation Age of Onset  . Hypertension Mother   . Hyperlipidemia Mother   . Macular degeneration Mother   . COPD Mother   . Lung cancer Father   . Cancer Father     Lung  . COPD Father   . Diabetes Brother   . Breast cancer Paternal Aunt      History   Social History  . Marital Status: Married    Spouse Name: N/A    Number of Children: N/A  . Years of Education: N/A   Occupational History  . Not on file.   Social History Main Topics  . Smoking status: Never Smoker   . Smokeless tobacco: Not on file  . Alcohol Use: No  . Drug Use: No  . Sexually Active: Yes   Other Topics Concern  . Not on file   Social History Narrative  . No narrative  on file     There were no vitals taken for this visit.  Physical Exam:  Well appearing middle-age woman,NAD HEENT: Unremarkable Neck:  No JVD, no thyromegally Back:  No CVA tenderness Lungs:  Clear with no wheezes, rales, or rhonchi. HEART:  Regular rate rhythm, no murmurs, no rubs, no clicks Abd:  soft, positive bowel sounds, no organomegally, no rebound, no guarding Ext:  2 plus pulses, no edema, no cyanosis, no clubbing Skin:  No rashes no nodules Neuro:  CN II through XII intact, motor grossly intact  EKG - normal sinus rhythm with 2:1 AV block  Assess/Plan:

## 2013-02-16 NOTE — Progress Notes (Signed)
Echocardiogram performed.  

## 2013-02-16 NOTE — Telephone Encounter (Signed)
Pt returned call was seen by cardiologist this am and is scheduled for echo this PM and pace maker insertion tomorrow am, will call pt on Friday and will make a follow up appt with Dr Constance Goltz after recovery from procedure.

## 2013-02-16 NOTE — Telephone Encounter (Signed)
Courtesy call LVM message to return call

## 2013-02-17 ENCOUNTER — Encounter (HOSPITAL_COMMUNITY)
Admission: RE | Disposition: A | Discharge status: Home / Self Care | Payer: Self-pay | Source: Ambulatory Visit | Attending: Internal Medicine

## 2013-02-17 ENCOUNTER — Ambulatory Visit (HOSPITAL_COMMUNITY)
Admission: RE | Admit: 2013-02-17 | Discharge: 2013-02-18 | Disposition: A | Discharge status: Home / Self Care | Payer: 59 | Source: Ambulatory Visit | Attending: Internal Medicine | Admitting: Internal Medicine

## 2013-02-17 ENCOUNTER — Encounter (HOSPITAL_COMMUNITY): Payer: Self-pay | Admitting: General Practice

## 2013-02-17 DIAGNOSIS — Z79899 Other long term (current) drug therapy: Secondary | ICD-10-CM | POA: Insufficient documentation

## 2013-02-17 DIAGNOSIS — K219 Gastro-esophageal reflux disease without esophagitis: Secondary | ICD-10-CM | POA: Insufficient documentation

## 2013-02-17 DIAGNOSIS — I441 Atrioventricular block, second degree: Secondary | ICD-10-CM | POA: Insufficient documentation

## 2013-02-17 DIAGNOSIS — M129 Arthropathy, unspecified: Secondary | ICD-10-CM | POA: Insufficient documentation

## 2013-02-17 DIAGNOSIS — B009 Herpesviral infection, unspecified: Secondary | ICD-10-CM | POA: Insufficient documentation

## 2013-02-17 DIAGNOSIS — K5730 Diverticulosis of large intestine without perforation or abscess without bleeding: Secondary | ICD-10-CM | POA: Insufficient documentation

## 2013-02-17 HISTORY — DX: Atrioventricular block, second degree: I44.1

## 2013-02-17 HISTORY — PX: INSERT / REPLACE / REMOVE PACEMAKER: SUR710

## 2013-02-17 HISTORY — DX: Other seasonal allergic rhinitis: J30.2

## 2013-02-17 HISTORY — PX: PERMANENT PACEMAKER INSERTION: SHX5480

## 2013-02-17 LAB — CBC
MCV: 90.1 fL (ref 78.0–100.0)
Platelets: 270 10*3/uL (ref 150–400)
RBC: 4.46 MIL/uL (ref 3.87–5.11)
WBC: 6.6 10*3/uL (ref 4.0–10.5)

## 2013-02-17 LAB — BASIC METABOLIC PANEL
CO2: 27 mEq/L (ref 19–32)
Calcium: 9.6 mg/dL (ref 8.4–10.5)
GFR calc Af Amer: >90 mL/min (ref >90)
GFR calc non Af Amer: >90 mL/min (ref >90)
Sodium: 139 mEq/L (ref 135–145)

## 2013-02-17 SURGERY — PERMANENT PACEMAKER INSERTION
Anesthesia: LOCAL

## 2013-02-17 MED ORDER — HYDROCODONE-ACETAMINOPHEN 5-325 MG PO TABS
1.0000 | ORAL_TABLET | Freq: Three times a day (TID) | ORAL | Status: DC | PRN
  Administered 2013-02-17 (×2): 1 via ORAL
  Filled 2013-02-17 (×2): qty 1

## 2013-02-17 MED ORDER — VALACYCLOVIR HCL 500 MG PO TABS
500.0000 mg | ORAL_TABLET | Freq: Every day | ORAL | Status: DC
  Administered 2013-02-17: 14:00:00 500 mg via ORAL
  Filled 2013-02-17: qty 1

## 2013-02-17 MED ORDER — LIDOCAINE HCL (PF) 1 % IJ SOLN
INTRAMUSCULAR | Status: AC
  Filled 2013-02-17: qty 30

## 2013-02-17 MED ORDER — ACETAMINOPHEN 325 MG PO TABS: 325.0000 mg | ORAL_TABLET | ORAL | Status: DC | PRN

## 2013-02-17 MED ORDER — ALBUTEROL SULFATE HFA 108 (90 BASE) MCG/ACT IN AERS: 2.0000 | INHALATION_SPRAY | RESPIRATORY_TRACT | Status: DC | PRN

## 2013-02-17 MED ORDER — ADULT MULTIVITAMIN W/MINERALS CH
1.0000 | ORAL_TABLET | Freq: Every day | ORAL | Status: DC
  Administered 2013-02-17 – 2013-02-18 (×2): 1 via ORAL
  Filled 2013-02-17 (×2): qty 1

## 2013-02-17 MED ORDER — ONDANSETRON HCL 4 MG/2ML IJ SOLN: 4.0000 mg | Freq: Four times a day (QID) | INTRAMUSCULAR | Status: DC | PRN

## 2013-02-17 MED ORDER — MIDAZOLAM HCL 5 MG/5ML IJ SOLN
INTRAMUSCULAR | Status: AC
  Filled 2013-02-17: qty 5

## 2013-02-17 MED ORDER — FENTANYL CITRATE 0.05 MG/ML IJ SOLN
INTRAMUSCULAR | Status: AC
  Filled 2013-02-17: qty 2

## 2013-02-17 MED ORDER — CEFAZOLIN SODIUM-DEXTROSE 2-3 GM-% IV SOLR
2.0000 g | INTRAVENOUS | Status: DC
  Filled 2013-02-17: qty 50

## 2013-02-17 MED ORDER — CLINDAMYCIN PHOSPHATE 600 MG/50ML IV SOLN
600.0000 mg | Freq: Four times a day (QID) | INTRAVENOUS | Status: DC
  Administered 2013-02-17 – 2013-02-18 (×4): 600 mg via INTRAVENOUS
  Filled 2013-02-17 (×7): qty 50

## 2013-02-17 MED ORDER — WHITE PETROLATUM GEL
Status: AC
  Administered 2013-02-17
  Filled 2013-02-17: qty 5

## 2013-02-17 MED ORDER — HYDROCODONE-ACETAMINOPHEN 5-325 MG PO TABS: 1.0000 | ORAL_TABLET | Freq: Four times a day (QID) | ORAL | Status: DC | PRN

## 2013-02-17 MED ORDER — FLUTICASONE PROPIONATE 50 MCG/ACT NA SUSP: 2.0000 | Freq: Every day | NASAL | Status: DC | PRN

## 2013-02-17 MED ORDER — OMEGA-3-ACID ETHYL ESTERS 1 G PO CAPS
4.0000 g | ORAL_CAPSULE | Freq: Every day | ORAL | Status: DC
  Administered 2013-02-17 – 2013-02-18 (×2): 4 g via ORAL
  Filled 2013-02-17 (×2): qty 4

## 2013-02-17 MED ORDER — CLINDAMYCIN PHOSPHATE 600 MG/50ML IV SOLN
600.0000 mg | Freq: Four times a day (QID) | INTRAVENOUS | Status: DC
  Filled 2013-02-17 (×2): qty 50

## 2013-02-17 MED ORDER — YOU HAVE A PACEMAKER BOOK
Freq: Once | Status: AC
  Administered 2013-02-17: 13:00:00
  Filled 2013-02-17: qty 1

## 2013-02-17 MED ORDER — CEFAZOLIN SODIUM-DEXTROSE 2-3 GM-% IV SOLR
INTRAVENOUS | Status: AC
  Filled 2013-02-17: qty 50

## 2013-02-17 MED ORDER — ALBUTEROL 90 MCG/ACT IN AERS: 2.0000 | INHALATION_SPRAY | RESPIRATORY_TRACT | Status: DC | PRN

## 2013-02-17 MED ORDER — CLINDAMYCIN PHOSPHATE 600 MG/50ML IV SOLN
600.0000 mg | Freq: Once | INTRAVENOUS | Status: DC
  Filled 2013-02-17: qty 50

## 2013-02-17 MED ORDER — FISH OIL 600 MG PO CAPS: 4.0000 | ORAL_CAPSULE | Freq: Every day | ORAL | Status: DC

## 2013-02-17 MED ORDER — HEPARIN (PORCINE) IN NACL 2-0.9 UNIT/ML-% IJ SOLN
INTRAMUSCULAR | Status: AC
  Filled 2013-02-17: qty 500

## 2013-02-17 MED ORDER — HYDROCODONE-ACETAMINOPHEN 5-325 MG PO TABS
1.0000 | ORAL_TABLET | ORAL | Status: DC | PRN
  Administered 2013-02-17: 2 via ORAL
  Administered 2013-02-17: 1 via ORAL
  Administered 2013-02-18 (×2): 2 via ORAL
  Administered 2013-02-18: 1 via ORAL
  Filled 2013-02-17 (×5): qty 2

## 2013-02-17 MED ORDER — MUPIROCIN 2 % EX OINT
TOPICAL_OINTMENT | CUTANEOUS | Status: AC
  Administered 2013-02-17: 06:00:00
  Filled 2013-02-17: qty 22

## 2013-02-17 MED ORDER — VALACYCLOVIR HCL 500 MG PO TABS
1000.0000 mg | ORAL_TABLET | Freq: Three times a day (TID) | ORAL | Status: DC
  Administered 2013-02-17 – 2013-02-18 (×3): 1000 mg via ORAL
  Filled 2013-02-17 (×5): qty 2

## 2013-02-17 NOTE — Op Note (Signed)
DDD PPM insertion via the left cephalic vein without immediate complication. W#[id number].

## 2013-02-17 NOTE — Progress Notes (Signed)
Called by RN re: herpes flair and inadequate pain control  Increased in-hosp Vicodin, increased Valtrex to flare treatment.

## 2013-02-17 NOTE — H&P (View-Only) (Signed)
HPI Kari [name] is referred today for evaluation of bradycardia. She is a 62 yo woman with a h/o asthma, who has been found to have symptomatic 2:1 AV block and is referred for evaluation. She has never had syncope. She denies chest pain or shortness of breath. She has had episodes where she was fatigued and weak and could not walk up a hill. She checked her pulse, and it was in the low 40s. She says when he underwent medical evaluation where she was found to have 2-1 heart block with a ventricular rate of 40 beats per minute. The patient denies anginal symptoms. She has not had chest pain. A recent chest x-ray demonstrated no infiltrates. No peripheral edema or other systemic illnesses. Allergies  Allergen Reactions  . Ceftin Hives  . Levofloxacin Other (See Comments)    "spacey"  . Vancomycin Hives     Current Outpatient Prescriptions  Medication Sig Dispense Refill  . albuterol (PROVENTIL,VENTOLIN) 90 MCG/ACT inhaler Inhale 2 puffs into the lungs every 4 (four) hours as needed.  17 g  0  . aspirin 81 MG tablet Take 81 mg by mouth daily.      . fluticasone (FLONASE) 50 MCG/ACT nasal spray PLACE 2 SPRAYS INTO THE NOSE DAILY. IN FALL/SPRING  16 g  1  . levocetirizine (XYZAL) 5 MG tablet Take 1 tablet (5 mg total) by mouth every evening.  30 tablet  3  . Multiple Vitamin (MULTIVITAMIN) capsule Take 1 capsule by mouth daily.        . Omega-3 Fatty Acids (FISH OIL) 1200 MG CAPS Take 2 capsules by mouth daily.        . Probiotic Product (PROBIOTIC FORMULA PO) Take 1 capsule by mouth daily.        . valACYclovir (VALTREX) 1000 MG tablet TAKE 1 TABLET BY MOUTH DAILY.  10 tablet  1  . valACYclovir (VALTREX) 500 MG tablet Take 1 tablet (500 mg total) by mouth daily.  90 tablet  3   No current facility-administered medications for this visit.     Past Medical History  Diagnosis Date  . Herpes zoster conjunctivitis   . Menopause   . GERD (gastroesophageal reflux disease)   . Arthritis   .  Diverticulosis   . Allergy     ROS:   All systems reviewed and negative except as noted in the HPI.   Past Surgical History  Procedure Laterality Date  . Abdominal hysterectomy    . Knee arthroscopy    . Cholecystectomy    . Tonsillectomy       Family History  Problem Relation Age of Onset  . Hypertension Mother   . Hyperlipidemia Mother   . Macular degeneration Mother   . COPD Mother   . Lung cancer Father   . Cancer Father     Lung  . COPD Father   . Diabetes Brother   . Breast cancer Paternal Aunt      History   Social History  . Marital Status: Married    Spouse Name: N/A    Number of Children: N/A  . Years of Education: N/A   Occupational History  . Not on file.   Social History Main Topics  . Smoking status: Never Smoker   . Smokeless tobacco: Not on file  . Alcohol Use: No  . Drug Use: No  . Sexually Active: Yes   Other Topics Concern  . Not on file   Social History Narrative  . No narrative  on file     There were no vitals taken for this visit.  Physical Exam:  Well appearing middle-age woman,NAD HEENT: Unremarkable Neck:  No JVD, no thyromegally Back:  No CVA tenderness Lungs:  Clear with no wheezes, rales, or rhonchi. HEART:  Regular rate rhythm, no murmurs, no rubs, no clicks Abd:  soft, positive bowel sounds, no organomegally, no rebound, no guarding Ext:  2 plus pulses, no edema, no cyanosis, no clubbing Skin:  No rashes no nodules Neuro:  CN II through XII intact, motor grossly intact  EKG - normal sinus rhythm with 2:1 AV block  Assess/Plan:

## 2013-02-17 NOTE — Discharge Summary (Signed)
ELECTROPHYSIOLOGY PROCEDURE DISCHARGE SUMMARY    Patient ID: Kari [name],  MRN: [id number], DOB/AGE: 1951/06/30 62 y.o.  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  GERD 2.  Arthritis 3.  Diverticulosis  Procedures This Admission:  1.  Implantation of a dual chamber [employer] pacemaker on 02-17-2013 by Dr Ladona Ridgel.  For full details, see dictated op note. There were no early apparent complications. 2.  CXR on 02-18-2013   Brief HPI: Kari [name] is a 61 year old female who was referred for evaluation of bradycardia. She has a h/o asthma, who has been found to have symptomatic 2:1 AV block. She has never had syncope. She denies chest pain or shortness of breath. She has had episodes where she was fatigued and weak and could not walk up a hill. She checked her pulse, and it was in the low 40s. She says when he underwent medical evaluation where she was found to have 2-1 heart block with a ventricular rate of 40 beats per minute. The patient denies anginal symptoms. She has not had chest pain. A recent chest x-ray demonstrated no infiltrates.  Risks, benefits, and alternatives to pacemaker implantation were reviewed with the patient who wished to proceed.  Hospital Course:  The patient was admitted and underwent implantation of a dual chamber [employer] pacemaker with details as outlined above.   She was monitored on telemetry overnight which demonstrated sinus rhythm with intermittent ventricular pacing/some fusion (device reprogrammed before discharge).  Left chest was without hematoma or ecchymosis.  The device was interrogated and found to be functioning normally.  CXR was obtained and demonstrated no pneumothorax status post device  implantation.  Wound care, arm mobility, and restrictions were reviewed with the patient.  Dr Johney Frame examined the patient and considered them stable for discharge to home.   Discharge Vitals: Blood pressure 108/72, pulse 59, temperature 97.6 F (36.4 C), temperature source Oral, resp. rate 18, height 5\' 8"  (1.727 m), weight 166 lb (75.297 kg), SpO2 93.00%.    Labs:   Lab Results  Component Value Date   WBC 6.6 02/17/2013   HGB 14.1 02/17/2013   HCT 40.2 02/17/2013   MCV 90.1 02/17/2013   PLT 270 02/17/2013     Recent Labs Lab 02/17/13 0632  NA 139  K 3.7  CL 104  CO2 27  BUN 16  CREATININE 0.74  CALCIUM 9.6  GLUCOSE 100*   Discharge Medications:    Medication List    ASK your doctor about these medications       albuterol 90 MCG/ACT inhaler  Commonly known as:  PROVENTIL,VENTOLIN  Inhale 2 puffs into the lungs every 4 (four) hours as needed. For wheezing     aspirin 81 MG tablet  Take 81 mg by mouth daily.     Fish Oil 600 MG Caps  Take 4 capsules by mouth daily.     fluticasone 50 MCG/ACT nasal spray  Commonly known as:  FLONASE  Place 2 sprays into the nose daily as needed for rhinitis. For seasonal allergies     HYDROcodone-acetaminophen 5-325 MG per tablet  Commonly known as:  NORCO/VICODIN  Take 1 tablet by mouth 3 (three) times daily as needed for pain. For pain     multivitamin with minerals Tabs  Take 1 tablet  by mouth daily.     PROBIOTIC FORMULA PO  Take 2 capsules by mouth every morning.     valACYclovir 500 MG tablet  Commonly known as:  VALTREX  Take 500 mg by mouth daily.          Future Appointments Provider Department Dept Phone   02/28/2013 9:30 AM [facility] [employer] Main [facility] [place]) [phone]     Physical Exam: Filed Vitals:   02/17/13 1633 02/17/13 1900 02/18/13 0001 02/18/13 0400  BP: 121/70 110/45 108/72 135/82  Pulse: 64 80 59 60  Temp: 97.7 F (36.5 C) 97.9 F (36.6 C) 97.6 F (36.4 C) 97.8 F  (36.6 C)  TempSrc: Oral Oral Oral Oral  Resp: 18 18 18 18   Height:      Weight:      SpO2: 97% 97% 93% 96%    GEN- The patient is well appearing, alert and oriented x 3 today.   Head- normocephalic, atraumatic Eyes-  Sclera clear, conjunctiva pink Ears- hearing intact Oropharynx- clear Neck- supple, no JVP Lymph- no cervical lymphadenopathy Lungs- Clear to ausculation bilaterally, normal work of breathing Heart- Regular rate and rhythm, no murmurs, rubs or gallops, PMI not laterally displaced GI- soft, NT, ND, + BS Extremities- no clubbing, cyanosis, or edema Pacemaker pocket without hematoma  Device interrogation reviewed and normal (see paper chart)  Duration of Discharge Encounter: Greater than 30 minutes including physician time.  Signed,  Hillis Range, MD

## 2013-02-17 NOTE — Interval H&P Note (Signed)
History and Physical Interval Note: Her 2D echo demonstrates normal LV function.   02/17/2013 7:10 AM  Kari [name]  has presented today for surgery, with the diagnosis of mobitz 2 second degree AV block.  The various methods of treatment have been discussed with the patient and family. After consideration of risks, benefits and other options for treatment, the patient has consented to  Procedure(s): PERMANENT PACEMAKER INSERTION (N/A) as a surgical intervention .  The patient's history has been reviewed, patient examined, no change in status, stable for surgery.  I have reviewed the patient's chart and labs.  Questions were answered to the patient's satisfaction.     Leonia Reeves.D.

## 2013-02-17 NOTE — Progress Notes (Signed)
Utilization Review Completed Madelene Kaatz J. Torsten Weniger, RN, BSN, NCM [phone]  

## 2013-02-18 ENCOUNTER — Ambulatory Visit (HOSPITAL_COMMUNITY): Payer: 59

## 2013-02-18 ENCOUNTER — Telehealth: Payer: Self-pay | Admitting: Internal Medicine

## 2013-02-18 DIAGNOSIS — I441 Atrioventricular block, second degree: Secondary | ICD-10-CM

## 2013-02-18 MED ORDER — HYDROCORTISONE 1 % EX CREA
TOPICAL_CREAM | CUTANEOUS | Status: DC | PRN
  Filled 2013-02-18: qty 28

## 2013-02-18 NOTE — Op Note (Signed)
NAMEHENCHY, MCCAULEY           ACCOUNT NO.:  [id number]  MEDICAL RECORD NO.:  [id number]  LOCATION:  6533                         FACILITY:  [facility]  PHYSICIAN:  Doylene Canning. Ladona Ridgel, MD    DATE OF BIRTH:  04/28/1951  DATE OF PROCEDURE:  02/17/2013 DATE OF DISCHARGE:                              OPERATIVE REPORT   PROCEDURE PERFORMED:  Insertion of a dual-chamber pacemaker.  INDICATION:  Symptomatic 2:1 heart block.  INTRODUCTION:  The patient is a very pleasant 62 year old woman who is very active and healthy.  She presented to her primary physician noting that she was having slow heart rates when she was exerting herself.  She was found to be in 2:1 heart block.  She is now referred for insertion of a dual-chamber pacemaker as there was no reversible causes to explain her 2:1 heart block.  PROCEDURE:  After informed consent was obtained, the patient was taken to the diagnostic EP lab in a fasting state.  After usual preparation and draping, intravenous fentanyl and midazolam was given for sedation. A 30 mL of lidocaine was infiltrated into the left infraclavicular region.  A 5-cm incision was carried out over this region. Electrocautery was utilized to dissect down to the fascial plane.  The cephalic vein was entered without difficulty and the Medtronic model 5076, 52 cm active fixation pacing lead, serial #[id number] was advanced to the right ventricle, and the [employer]onic model 5076, 45 cm active fixation pacing lead, serial #[id number] being advanced to the right atrium.  Mapping was first carried out in the right ventricle.  At the final site, the R-waves measured 14 mV and the pacing impedance was 600 ohms, the threshold 0.5 V at 0.4 milliseconds.  A large injury current was present with active fixation of the lead.  With the ventricular lead in satisfactory position, attention was then turned to placement of the atrial lead, which was placed in anterolateral portion of the  right atrium where P-waves measured 5 mV.  The pacing impedance was 900 ohms and the threshold 0.5 V at 0.4 milliseconds.  A large injury current was present.  With these satisfactory parameters, the leads were secured to the subpectoral fascia with a figure-of-eight silk suture.  Sewing sleeve was secured with silk suture.  Electrocautery was utilized to make a subcutaneous pocket.  Antibiotic irrigation was utilized to irrigate the pocket, and electrocautery was utilized to assure hemostasis.  The [employer], model [place], serial [id number] was connected to the right atrial and right ventricular pacing leads and placed back into the subcutaneous pocket where it was secured with silk suture.  The pocket was irrigated with antibiotic irrigation and the incision was closed with 2-0 and 3-0 Vicryl.  Benzoin and Steri-Strips were painted on the skin.  A pressure dressing was applied.  The patient was returned to her room in satisfactory condition.  COMPLICATIONS:  There were no immediate procedure complications.  RESULTS:  This demonstrate successful implantation of a [employer] dual-chamber pacemaker in a patient with symptomatic 2:1 heart block.     Doylene Canning. Ladona Ridgel, MD     GWT/MEDQ  D:  02/17/2013  T:  02/18/2013  Job:  [id number]  cc:   Birdena Jubilee, MD [place], M.D.

## 2013-02-18 NOTE — Telephone Encounter (Signed)
Pt called to report that after arriving back home from [facility] hospital receiving a PPM, her heart rate went up to 103 for a few seconds.  It scared the pt.  Patient's current heart rate is 73.  Reassured pt.

## 2013-02-18 NOTE — Telephone Encounter (Signed)
New Problem  Pt said that he heart feels like it is racing.  She that when she took her pulse it was 103.  She wants to know what to do.

## 2013-02-19 ENCOUNTER — Observation Stay (HOSPITAL_COMMUNITY)
Admission: EM | Admit: 2013-02-19 | Discharge: 2013-02-20 | Disposition: A | Discharge status: Home / Self Care | Payer: 59 | Attending: Internal Medicine | Admitting: Internal Medicine

## 2013-02-19 ENCOUNTER — Other Ambulatory Visit: Payer: Self-pay

## 2013-02-19 ENCOUNTER — Emergency Department (HOSPITAL_COMMUNITY): Payer: 59

## 2013-02-19 ENCOUNTER — Encounter (HOSPITAL_COMMUNITY): Payer: Self-pay | Admitting: Emergency Medicine

## 2013-02-19 DIAGNOSIS — R002 Palpitations: Secondary | ICD-10-CM | POA: Diagnosis present

## 2013-02-19 DIAGNOSIS — Z950 Presence of cardiac pacemaker: Secondary | ICD-10-CM | POA: Insufficient documentation

## 2013-02-19 DIAGNOSIS — I441 Atrioventricular block, second degree: Principal | ICD-10-CM | POA: Insufficient documentation

## 2013-02-19 DIAGNOSIS — B0231 Zoster conjunctivitis: Secondary | ICD-10-CM | POA: Diagnosis present

## 2013-02-19 DIAGNOSIS — K219 Gastro-esophageal reflux disease without esophagitis: Secondary | ICD-10-CM | POA: Insufficient documentation

## 2013-02-19 DIAGNOSIS — B0233 Zoster keratitis: Secondary | ICD-10-CM | POA: Insufficient documentation

## 2013-02-19 DIAGNOSIS — R0789 Other chest pain: Secondary | ICD-10-CM | POA: Diagnosis present

## 2013-02-19 LAB — CBC WITH DIFFERENTIAL/PLATELET
Basophils Absolute: 0 10*3/uL (ref 0.0–0.1)
Eosinophils Absolute: 0.1 10*3/uL (ref 0.0–0.7)
Lymphs Abs: 2.3 10*3/uL (ref 0.7–4.0)
MCH: 31.7 pg (ref 26.0–34.0)
Neutrophils Relative %: 65 % (ref 43–77)
Platelets: 247 10*3/uL (ref 150–400)
RBC: 4.6 MIL/uL (ref 3.87–5.11)
WBC: 8.8 10*3/uL (ref 4.0–10.5)

## 2013-02-19 LAB — POCT I-STAT, CHEM 8
Calcium, Ion: 1.25 mmol/L (ref 1.13–1.30)
Hemoglobin: 15.6 g/dL — ABNORMAL HIGH (ref 12.0–15.0)
Sodium: 141 mEq/L (ref 135–145)
TCO2: 27 mmol/L (ref 0–100)

## 2013-02-19 LAB — MAGNESIUM: Magnesium: 2 mg/dL (ref 1.5–2.5)

## 2013-02-19 LAB — TROPONIN I: Troponin I: <0.3 ng/mL

## 2013-02-19 MED ORDER — ASPIRIN EC 81 MG PO TBEC: 81.0000 mg | DELAYED_RELEASE_TABLET | Freq: Every day | ORAL | Status: DC

## 2013-02-19 MED ORDER — NITROGLYCERIN 0.4 MG SL SUBL: 0.4000 mg | SUBLINGUAL_TABLET | SUBLINGUAL | Status: DC | PRN

## 2013-02-19 MED ORDER — ASPIRIN 81 MG PO TABS: 81.0000 mg | ORAL_TABLET | Freq: Every day | ORAL | Status: DC

## 2013-02-19 MED ORDER — ONDANSETRON HCL 4 MG/2ML IJ SOLN: 4.0000 mg | Freq: Four times a day (QID) | INTRAMUSCULAR | Status: DC | PRN

## 2013-02-19 MED ORDER — PANTOPRAZOLE SODIUM 40 MG PO TBEC
40.0000 mg | DELAYED_RELEASE_TABLET | Freq: Every day | ORAL | Status: DC
  Administered 2013-02-20: 40 mg via ORAL
  Filled 2013-02-19: qty 1

## 2013-02-19 MED ORDER — FLUTICASONE PROPIONATE 50 MCG/ACT NA SUSP
2.0000 | Freq: Every day | NASAL | Status: DC | PRN
  Filled 2013-02-19: qty 16

## 2013-02-19 MED ORDER — SODIUM CHLORIDE 0.9 % IV SOLN: 250.0000 mL | INTRAVENOUS | Status: DC | PRN

## 2013-02-19 MED ORDER — ACETAMINOPHEN 325 MG PO TABS: 650.0000 mg | ORAL_TABLET | ORAL | Status: DC | PRN

## 2013-02-19 MED ORDER — ALBUTEROL SULFATE HFA 108 (90 BASE) MCG/ACT IN AERS
2.0000 | INHALATION_SPRAY | RESPIRATORY_TRACT | Status: DC | PRN
  Filled 2013-02-19: qty 6.7

## 2013-02-19 MED ORDER — HYDROCODONE-ACETAMINOPHEN 5-325 MG PO TABS: 1.0000 | ORAL_TABLET | Freq: Three times a day (TID) | ORAL | Status: DC | PRN

## 2013-02-19 MED ORDER — ALBUTEROL 90 MCG/ACT IN AERS: 2.0000 | INHALATION_SPRAY | RESPIRATORY_TRACT | Status: DC | PRN

## 2013-02-19 MED ORDER — PANTOPRAZOLE SODIUM 40 MG PO TBEC
40.0000 mg | DELAYED_RELEASE_TABLET | Freq: Once | ORAL | Status: AC
  Administered 2013-02-19: 40 mg via ORAL
  Filled 2013-02-19: qty 1

## 2013-02-19 MED ORDER — SODIUM CHLORIDE 0.9 % IJ SOLN
3.0000 mL | INTRAMUSCULAR | Status: DC | PRN
  Administered 2013-02-19: 3 mL via INTRAVENOUS

## 2013-02-19 MED ORDER — SODIUM CHLORIDE 0.9 % IJ SOLN: 3.0000 mL | Freq: Two times a day (BID) | INTRAMUSCULAR | Status: DC

## 2013-02-19 MED ORDER — ASPIRIN EC 81 MG PO TBEC
81.0000 mg | DELAYED_RELEASE_TABLET | Freq: Every day | ORAL | Status: DC
  Filled 2013-02-19 (×2): qty 1

## 2013-02-19 MED ORDER — VALACYCLOVIR HCL 500 MG PO TABS
500.0000 mg | ORAL_TABLET | Freq: Every day | ORAL | Status: DC
  Filled 2013-02-19: qty 1

## 2013-02-19 NOTE — Progress Notes (Signed)
  Echocardiogram 2D Echocardiogram has been performed.  Kari [name] 02/19/2013, 5:09 PM

## 2013-02-19 NOTE — ED Notes (Signed)
Pt. Stated, I felt my heart beating irregular and this am it was around 103 and pounding. I got my pacemaker on Thursday morning.

## 2013-02-19 NOTE — ED Provider Notes (Signed)
History    CSN: [id number] Arrival date & time 02/19/13  1329  First MD Initiated Contact with Patient 02/19/13 1352     Chief Complaint  Patient presents with  . Palpitations   (Consider location/radiation/quality/duration/timing/severity/associated sxs/prior Treatment) HPI  A fast pounding heartbeat gradual onset yesterday.Sx  Accompanied by anterior chest presssure No other complaint except for mild pain at pacemaker insertion site.. Patient had pacemaker inserted 2 days ago for second degree heart block. No treatment prior to coming here.. nothing makes symptoms better or worse symptoms are constant. Past Medical History  Diagnosis Date  . Herpes zoster conjunctivitis   . Menopause   . Diverticulosis   . Pneumonia 2009  . GERD (gastroesophageal reflux disease)     "related to NSAID RX only" (02/17/2013)  . Arthritis     "hands" (02/17/2013)  . Seasonal allergies     "spring & fall; not q year" (02/17/2013)  . Mobitz type 2 second degree AV block     2:1/notes 02/16/2013   Past Surgical History  Procedure Laterality Date  . Vaginal hysterectomy  ~ 1994  . Arthrogram knee Right 1975  . Insert / replace / remove pacemaker  02/17/2013  . Tonsillectomy  1961  . Cholecystectomy  1990's  . Knee arthroscopy Right 1982, 1983, 1995, 1996  . Knee arthroscopy w/ acl reconstruction Left 1984  . 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 Trigger finger release Right ~ 2012    "thumb" (02/17/2013)   Family History  Problem Relation Age of Onset  . Hypertension Mother   . Hyperlipidemia Mother   . Macular degeneration Mother   . COPD Mother   . Lung cancer Father   . Cancer Father     Lung  . COPD Father   . Diabetes Brother   . Breast cancer Paternal Aunt    History  Substance Use Topics  . Smoking status: Never Smoker   . Smokeless tobacco: Never Used  . Alcohol Use: Yes     Comment: 02/17/2013 '4oz wine q couple  weeks"   OB History   Grav Para Term Preterm Abortions TAB SAB Ect Mult Living   1 1             Review of Systems  Constitutional: Negative.   HENT: Negative.   Respiratory: Negative.   Cardiovascular: Positive for chest pain and palpitations.  Gastrointestinal: Negative.   Musculoskeletal: Negative.   Skin: Positive for wound.       Mild pain at pacemaker insertion site.  Allergic/Immunologic: Negative.   Neurological: Negative.   Psychiatric/Behavioral: Negative.   All other systems reviewed and are negative.    Allergies  Ceftin; Levofloxacin; and Vancomycin  Home Medications   Current Outpatient Rx  Name  Route  Sig  Dispense  Refill  . albuterol (PROVENTIL,VENTOLIN) 90 MCG/ACT inhaler   Inhalation   Inhale 2 puffs into the lungs every 4 (four) hours as needed. For wheezing         . aspirin 81 MG tablet   Oral   Take 81 mg by mouth daily.         . fluticasone (FLONASE) 50 MCG/ACT nasal spray   Nasal   Place 2 sprays into the nose daily as needed for rhinitis. For seasonal allergies         . HYDROcodone-acetaminophen (NORCO/VICODIN) 5-325 MG per tablet   Oral   Take 1 tablet by mouth 3 (three)  times daily as needed for pain. For pain         . Multiple Vitamin (MULTIVITAMIN WITH MINERALS) TABS   Oral   Take 1 tablet by mouth daily.         . Omega-3 Fatty Acids (FISH OIL) 600 MG CAPS   Oral   Take 4 capsules by mouth daily.         . Probiotic Product (PROBIOTIC FORMULA PO)   Oral   Take 2 capsules by mouth every morning.          . valACYclovir (VALTREX) 500 MG tablet   Oral   Take 500 mg by mouth daily.          BP 139/118  Pulse 99  Temp(Src) 98.4 F (36.9 C) (Oral)  Resp 17  SpO2 99% Physical Exam  Nursing note and vitals reviewed. Constitutional: She appears well-developed and well-nourished.  HENT:  Head: Normocephalic and atraumatic.  Eyes: Conjunctivae are normal. Pupils are equal, round, and reactive to light.   Neck: Neck supple. No tracheal deviation present. No thyromegaly present.  Cardiovascular: Regular rhythm.   No murmur heard. Mildly tachycardic.  Pulmonary/Chest: Effort normal and breath sounds normal.  Abdominal: Soft. Bowel sounds are normal. She exhibits no distension. There is no tenderness.  Musculoskeletal: Normal range of motion. She exhibits no edema and no tenderness.  Neurological: She is alert. Coordination normal.  Skin: Skin is warm and dry. No rash noted.  Well-healing surgical wound left chest.  Psychiatric: She has a normal mood and affect.    ED Course  Procedures (including critical care time) Labs Reviewed - No data to display Dg Chest 2 View  02/18/2013   *RADIOLOGY REPORT*  Clinical Data: Status post pacemaker insertion.  Left arm soreness.  CHEST - 2 VIEW  Comparison: 05/10/2012  Findings: Dual lead pacemaker has been inserted.  No pneumothorax. Heart size and vascularity are normal.  Lungs are clear.  No osseous abnormality.  No effusions.  IMPRESSION: No acute abnormality.  New pacemaker in place.   Original Report Authenticated By: Francene Boyers, M.D.   No diagnosis found.  Date: 02/19/2013  Rate: 105  Rhythm: Electronically paced  QRS Axis: left  Intervals: normal  ST/T Wave abnormalities: nonspecific T wave changes  Conduction Disutrbances:right bundle branch block  Narrative Interpretation:   Old EKG Reviewed: Tracing from 02/18/2013 showed paced rhythm 60 beats per minute. Interpreted by me. Results for orders placed during the hospital encounter of 02/19/13  TROPONIN I      Result Value Range   Troponin I <0.30  <0.30 ng/mL  TROPONIN I      Result Value Range   Troponin I <0.30  <0.30 ng/mL  D-DIMER, QUANTITATIVE      Result Value Range   D-Dimer, Quant 0.84 (*) 0.00 - 0.48 ug/mL-FEU  TSH      Result Value Range   TSH 0.826  0.350 - 4.500 uIU/mL  TROPONIN I      Result Value Range   Troponin I <0.30  <0.30 ng/mL  PROTIME-INR      Result  Value Range   Prothrombin Time 12.9  11.6 - 15.2 seconds   INR 0.99  0.00 - 1.49  APTT      Result Value Range   aPTT 35  24 - 37 seconds  CBC WITH DIFFERENTIAL      Result Value Range   WBC 8.8  4.0 - 10.5 K/uL   RBC 4.60  3.87 -  5.11 MIL/uL   Hemoglobin 14.6  12.0 - 15.0 g/dL   HCT 09.8  11.9 - 14.7 %   MCV 90.9  78.0 - 100.0 fL   MCH 31.7  26.0 - 34.0 pg   MCHC 34.9  30.0 - 36.0 g/dL   RDW 82.9  56.2 - 13.0 %   Platelets 247  150 - 400 K/uL   Neutrophils Relative % 65  43 - 77 %   Neutro Abs 5.7  1.7 - 7.7 K/uL   Lymphocytes Relative 26  12 - 46 %   Lymphs Abs 2.3  0.7 - 4.0 K/uL   Monocytes Relative 9  3 - 12 %   Monocytes Absolute 0.8  0.1 - 1.0 K/uL   Eosinophils Relative 1  0 - 5 %   Eosinophils Absolute 0.1  0.0 - 0.7 K/uL   Basophils Relative 0  0 - 1 %   Basophils Absolute 0.0  0.0 - 0.1 K/uL  MAGNESIUM      Result Value Range   Magnesium 2.0  1.5 - 2.5 mg/dL  POCT I-STAT, CHEM 8      Result Value Range   Sodium 141  135 - 145 mEq/L   Potassium 3.8  3.5 - 5.1 mEq/L   Chloride 102  96 - 112 mEq/L   BUN 11  6 - 23 mg/dL   Creatinine, Ser 8.65  0.50 - 1.10 mg/dL   Glucose, Bld 99  70 - 99 mg/dL   Calcium, Ion 7.84  6.96 - 1.30 mmol/L   TCO2 27  0 - 100 mmol/L   Hemoglobin 15.6 (*) 12.0 - 15.0 g/dL   HCT 29.5  28.4 - 13.2 %   Dg Chest 2 View  02/19/2013   *RADIOLOGY REPORT*  Clinical Data: Tachycardia  CHEST - 2 VIEW  Comparison: 06/27  Findings: Artifact overlies chest.  Dual lead pacemaker in place from a left subclavian approach.  Leads appear unchanged and grossly well positioned.  Heart size is normal.  The vascularity is normal.  Lungs are clear.  No effusions.  Ordinary mild degenerative changes effect the spine.  IMPRESSION: No active cardiopulmonary disease.  Dual lead pacemaker grossly unchanged.   Original Report Authenticated By: Paulina Fusi, M.D.   Dg Chest 2 View  02/18/2013   *RADIOLOGY REPORT*  Clinical Data: Status post pacemaker insertion.  Left  arm soreness.  CHEST - 2 VIEW  Comparison: 05/10/2012  Findings: Dual lead pacemaker has been inserted.  No pneumothorax. Heart size and vascularity are normal.  Lungs are clear.  No osseous abnormality.  No effusions.  IMPRESSION: No acute abnormality.  New pacemaker in place.   Original Report Authenticated By: Francene Boyers, M.D.   Ct Angio Chest Pe W/cm &/or Wo Cm  02/20/2013   *RADIOLOGY REPORT*  Clinical Data: Chest pain.  Pacemaker insertion.  CT ANGIOGRAPHY CHEST  Technique:  Multidetector CT imaging of the chest using the standard protocol during bolus administration of intravenous contrast. Multiplanar reconstructed images including MIPs were obtained and reviewed to evaluate the vascular anatomy.  Contrast: 80mL OMNIPAQUE IOHEXOL 350 MG/ML SOLN  Comparison: None.  Findings: There are no filling defects in the pulmonary arterial tree to suggest acute pulmonary thromboembolism.  No evidence of intramural hematoma or aortic dissection.  Dual lead left subclavian pacemaker device is in place.  No pericardial effusion or mediastinal hemorrhage.  No pneumothorax or pleural effusion. Lungs are clear other than scattered ground-glass secondary to volume loss.  No acute bony  deformity.  Small T7-T8 central disc protrusion. Left thyroid lobe is heterogeneous without focal nodule.  IMPRESSION: No evidence of acute pulmonary thromboembolism.   Original Report Authenticated By: Jolaine Click, M.D.    pacermaker iterrogated by [facility] scientific rep, is functionaing appropriately . Dr. Mayford Knife evalauted pt in the ed  MDM  Pt to be dispositioned by Dr. Mayford Knife Dx #1 palpitations #2 chest pain     Doug Sou, MD 02/20/13 1012

## 2013-02-19 NOTE — ED Notes (Signed)
DR Carolanne Grumbling called to report Echo cardiogram was completed. Pt updated of same.

## 2013-02-19 NOTE — H&P (Addendum)
Admit date: 02/19/2013 Referring Physician Dr. Ethelda Chick Primary Cardiologist Dr. Ladona Ridgel Chief complaint/reason for admission: chest pain/palpitations  HPI: This is a 62yo WF with a history of GERD, asthma and was just admitted with fatigue and weakness and noted her HR to be in the 40's.  She was found to be in 2:1 heart block and underwent dual chamber pacer on Thursday 6/26.  Post-op she was paced but the next am she was in NSR.  She was discharged home but today noticed that she was having pounding in her chest along with chest heaviness.  She denies any SOB.  She has also been having tightness in her chest when her heart rate gets too fast (110bpm).  She did not feel any of this yesterday.  The rep interrogated her pacer today in the ER and she was found to have NSR with atrial tracking and V pacing but no arrhythmias.  During her interrogation she developed chest tightness when the magnet was placed on the generator.  She also gets chest pressure when her HR gets too fast.  She says that her HR is never above 100bpm.  Pacer check today showed no arrhythmias and no atrial paced events.  HR gets as high as 105bpm in NSR.  She denies any SOB or LE edema.    PMH:    Past Medical History  Diagnosis Date  . Herpes zoster conjunctivitis   . Menopause   . Diverticulosis   . Pneumonia 2009  . GERD (gastroesophageal reflux disease)     "related to NSAID RX only" (02/17/2013)  . Arthritis     "hands" (02/17/2013)  . Seasonal allergies     "spring & fall; not q year" (02/17/2013)  . Mobitz type 2 second degree AV block     2:1/notes 02/16/2013    PSH:    Past Surgical History  Procedure Laterality Date  . Vaginal hysterectomy  ~ 1994  . Arthrogram knee Right 1975  . Insert / replace / remove pacemaker  02/17/2013  . Tonsillectomy  1961  . Cholecystectomy  1990's  . Knee arthroscopy Right 1982, 1983, 1995, 1996  . Knee arthroscopy w/ acl reconstruction Left 1984  . Total knee arthroplasty  Right 2000; 06/2000    "replaced w/appropriate hardware" (02/17/2013)  . Trigger finger release Right ~ 2012    "thumb" (02/17/2013)    ALLERGIES:   Ceftin; Levofloxacin; and Vancomycin  Prior to Admit Meds:   (Not in a hospital admission) Family HX:    Family History  Problem Relation Age of Onset  . Hypertension Mother   . Hyperlipidemia Mother   . Macular degeneration Mother   . COPD Mother   . Lung cancer Father   . Cancer Father     Lung  . COPD Father   . Diabetes Brother   . Breast cancer Paternal Aunt    Social HX:    History   Social History  . Marital Status: Married    Spouse Name: N/A    Number of Children: N/A  . Years of Education: N/A   Occupational History  . Not on file.   Social History Main Topics  . Smoking status: Never Smoker   . Smokeless tobacco: Never Used  . Alcohol Use: Yes     Comment: 02/17/2013 '4oz wine q couple weeks"  . Drug Use: No  . Sexually Active: Not on file     Comment: 02/17/2013 "I have a same sex partner"   Other Topics Concern  .  Not on file   Social History Narrative  . No narrative on file     ROS:  All 11 ROS were addressed and are negative except what is stated in the HPI  PHYSICAL EXAM Filed Vitals:   02/19/13 1334  BP: 139/118  Pulse: 99  Temp: 98.4 F (36.9 C)  Resp: 17   General: Well developed, well nourished, in no acute distress Head: Eyes PERRLA, No xanthomas.   Normal cephalic and atramatic  Lungs:   Clear bilaterally to auscultation and percussion. Heart:   HRRR S1 S2 Pulses are 2+ & equal.            No carotid bruit. No JVD.  No abdominal bruits. No femoral bruits. Abdomen: Bowel sounds are positive, abdomen soft and non-tender without masses  Extremities:   No clubbing, cyanosis or edema.  DP +1 Neuro: Alert and oriented X 3. Psych:  Good affect, responds appropriately   Labs:   Lab Results  Component Value Date   WBC 6.6 02/17/2013   HGB 15.6* 02/19/2013   HCT 46.0 02/19/2013   MCV  90.1 02/17/2013   PLT 270 02/17/2013    Recent Labs Lab 02/17/13 0632 02/19/13 1523  NA 139 141  K 3.7 3.8  CL 104 102  CO2 27  --   BUN 16 11  CREATININE 0.74 0.90  CALCIUM 9.6  --   GLUCOSE 100* 99       Radiology: pending  EKG:  NSR with V pacing  ASSESSMENT:  1.  Second degree AV block s/p dual chamber PPM 2.  Elevated BP secondary to anxiety 3.  Palpitations with no arrhythmias on pacer check.  Currently she is a sensed V paced.  Apparently she was a sensed V paced after pacer check and on Friday was in NSR.  She now feels her heart pounding and is a sensed and V paced again.  I suspect her heart pounding is due to the V pacing.  There was no evidence of PMT on pacer check per rep.   4.  Chest tightness which occurs with slow and fast heart rates of unclear etiology.  Diff Dx includes coronary ischemia, PE, pericardial effusion,, Pneumothorax,  anxiety.  Coronary ischemia needs to be ruled out given her underlying heart block with no other etiologies in a middle age woman.  She has never had CP though until after her PPM was placed.  Also need to consider some infiltrative cardiac process since she has no reason at her age to have conduction system disease although recent echo was normal.  PLAN:   1.  Check Chest xray to rule out Pneumothorax 2.  Check 2D echo to rule out pericardial effusion 3.  Check stat d-dimer and troponin 4.  Check TSH to rule out thyroid issues given her mild tachycardia 5.  Admit for 23 hour observation - Dr. Graciela Husbands to see in am  Armanda Magic, MD  02/19/2013  3:43 PM

## 2013-02-20 ENCOUNTER — Telehealth: Payer: Self-pay | Admitting: Physician Assistant

## 2013-02-20 ENCOUNTER — Observation Stay (HOSPITAL_COMMUNITY): Payer: 59

## 2013-02-20 ENCOUNTER — Encounter (HOSPITAL_COMMUNITY): Payer: Self-pay | Admitting: Physician Assistant

## 2013-02-20 DIAGNOSIS — Z950 Presence of cardiac pacemaker: Secondary | ICD-10-CM

## 2013-02-20 DIAGNOSIS — I441 Atrioventricular block, second degree: Secondary | ICD-10-CM

## 2013-02-20 LAB — PROTIME-INR
INR: 0.99 (ref 0.00–1.49)
Prothrombin Time: 12.9 seconds (ref 11.6–15.2)

## 2013-02-20 LAB — TROPONIN I: Troponin I: <0.3 ng/mL (ref <0.30)

## 2013-02-20 MED ORDER — IOHEXOL 350 MG/ML SOLN
80.0000 mL | Freq: Once | INTRAVENOUS | Status: AC | PRN
  Administered 2013-02-20: 80 mL via INTRAVENOUS

## 2013-02-20 MED ORDER — PANTOPRAZOLE SODIUM 40 MG PO TBEC: 40.0000 mg | DELAYED_RELEASE_TABLET | Freq: Every day | ORAL | Status: DC

## 2013-02-20 NOTE — Progress Notes (Signed)
Pt's d-dimer came back. Md made aware of the results. New orders received. Will cont to monitor pt.

## 2013-02-20 NOTE — Progress Notes (Signed)
[facility]   ELECTROPHYSIOLOGY CONSULT NOTE  Patient ID: Kari [name], MRN: [id number], DOB/AGE: 1950-11-11 62 y.o. Admit date: 02/19/2013 Date of Consult: 02/20/2013  Primary Physician: Levon Hedger, MD Primary Cardiologist:  GT  Chief Complaint:  Palpitations   HPI Kari [name] is a 62 y.o. female  Presented with 2:1 AVblock last week assoc with exercise intolerance, eval>>normal heart. TSH  Normal  furhter workup pending Came in yday with pounding and pain attributed to her pacemaker  No systemic symptoms  " I want it out"   Past Medical History  Diagnosis Date  . Herpes zoster conjunctivitis   . Menopause   . Diverticulosis   . Pneumonia 2009  . GERD (gastroesophageal reflux disease)     "related to NSAID RX only" (02/17/2013)  . Arthritis     "hands" (02/17/2013)  . Seasonal allergies     "spring & fall; not q year" (02/17/2013)  . Mobitz type 2 second degree AV block     2:1/notes 02/16/2013      Surgical History:  Past Surgical History  Procedure Laterality Date  . Vaginal hysterectomy  ~ 1994  . Arthrogram knee Right 1975  . Insert / replace / remove pacemaker  02/17/2013  . Tonsillectomy  1961  . Cholecystectomy  1990's  . Knee arthroscopy Right 1982, 1983, 1995, 1996  . Knee arthroscopy w/ acl reconstruction Left 1984  . 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 Trigger finger release Right ~ 2012    "thumb" (02/17/2013)     Home Meds: Prior to Admission medications   Medication Sig Start Date End Date Taking? Authorizing Provider  aspirin 81 MG tablet Take 81 mg by mouth daily.   Yes Historical Provider, MD  Multiple Vitamin (MULTIVITAMIN WITH MINERALS) TABS Take 1 tablet by mouth daily.   Yes Historical Provider, MD  Omega-3 Fatty Acids (FISH OIL) 600 MG CAPS Take 4 capsules by mouth daily.   Yes Historical Provider, MD  Probiotic Product (PROBIOTIC FORMULA PO) Take 2 capsules by mouth every  morning.    Yes Historical Provider, MD  valACYclovir (VALTREX) 500 MG tablet Take 500 mg by mouth daily. 02/14/13 02/14/14 Yes Gillian Scarce, MD  albuterol (PROVENTIL,VENTOLIN) 90 MCG/ACT inhaler Inhale 2 puffs into the lungs every 4 (four) hours as needed. For wheezing 08/28/11   Kendrick Ranch, MD  fluticasone (FLONASE) 50 MCG/ACT nasal spray Place 2 sprays into the nose daily as needed for rhinitis. For seasonal allergies    Historical Provider, MD  HYDROcodone-acetaminophen (NORCO/VICODIN) 5-325 MG per tablet Take 1 tablet by mouth 3 (three) times daily as needed for pain. For pain    Historical Provider, MD    Inpatient Medications:  . aspirin EC  81 mg Oral Daily  . pantoprazole  40 mg Oral Daily  . sodium chloride  3 mL Intravenous Q12H  . valACYclovir  500 mg Oral Daily    Allergies:  Allergies  Allergen Reactions  . Ceftin Hives  . Levofloxacin Other (See Comments)    "spacey"  . Vancomycin Hives    History   Social History  . Marital Status: Married    Spouse Name: N/A    Number of Children: N/A  . Years of Education: N/A   Occupational History  . Not on file.   Social History Main Topics  . Smoking status: Never Smoker   . Smokeless tobacco: Never Used  . Alcohol Use: Yes  Comment: 02/17/2013 '4oz wine q couple weeks"  . Drug Use: No  . Sexually Active: Not on file     Comment: 02/17/2013 "I have a same sex partner"   Other Topics Concern  . Not on file   Social History Narrative  . No narrative on file     Family History  Problem Relation Age of Onset  . Hypertension Mother   . Hyperlipidemia Mother   . Macular degeneration Mother   . COPD Mother   . Lung cancer Father   . Cancer Father     Lung  . COPD Father   . Diabetes Brother   . Breast cancer Paternal Aunt      ROS:  Please see the history of present illness.     All other systems reviewed and negative.    Physical Exam:   Blood pressure 114/69, pulse 63, temperature 97.7 F  (36.5 C), temperature source Oral, resp. rate 16, height 5\' 8"  (1.727 m), weight 160 lb 4.8 oz (72.712 kg), SpO2 94.00%. Alert and oriented in no acute distress HENT- normal Eyes- EOMI, without scleral icterus Skin- warm and dry; without rashes LN-neg Incision without hematomma Neck- supple   JVP-flat,   Back-without CVAT or kyphosis Lungs-clear to auscultation CV-Regular rate and rhythm, nl S1 and S2, no murmurs gallops or rubs, S4-absent Abd-soft with active bowel sounds; no midline pulsation or hepatomegaly Pulses-intact femoral and distal MKS-without gross deformity Neuro- Ax O, CN3-12 intact, grossly normal motor and sensory function Affect engaging     Labs: Cardiac Enzymes  Recent Labs  02/19/13 1609 02/19/13 2306 02/20/13 0523  TROPONINI <0.30 <0.30 <0.30   CBC Lab Results  Component Value Date   WBC 8.8 02/19/2013   HGB 14.6 02/19/2013   HCT 41.8 02/19/2013   MCV 90.9 02/19/2013   PLT 247 02/19/2013   PROTIME:  Recent Labs  02/19/13 2309  LABPROT 12.9  INR 0.99   Chemistry  Recent Labs Lab 02/17/13 0632 02/19/13 1523  NA 139 141  K 3.7 3.8  CL 104 102  CO2 27  --   BUN 16 11  CREATININE 0.74 0.90  CALCIUM 9.6  --   GLUCOSE 100* 99   Lipids No results found for this basename: CHOL, HDL, LDLCALC, TRIG   BNP No results found for this basename: probnp   Miscellaneous Lab Results  Component Value Date   DDIMER 0.84* 02/19/2013    Radiology/Studies:  Dg Chest 2 View  02/19/2013   *RADIOLOGY REPORT*  Clinical Data: Tachycardia  CHEST - 2 VIEW  Comparison: 06/27  Findings: Artifact overlies chest.  Dual lead pacemaker in place from a left subclavian approach.  Leads appear unchanged and grossly well positioned.  Heart size is normal.  The vascularity is normal.  Lungs are clear.  No effusions.  Ordinary mild degenerative changes effect the spine.  IMPRESSION: No active cardiopulmonary disease.  Dual lead pacemaker grossly unchanged.   Original  Report Authenticated By: Paulina Fusi, M.D.   Dg Chest 2 View  02/18/2013   *RADIOLOGY REPORT*  Clinical Data: Status post pacemaker insertion.  Left arm soreness.  CHEST - 2 VIEW  Comparison: 05/10/2012  Findings: Dual lead pacemaker has been inserted.  No pneumothorax. Heart size and vascularity are normal.  Lungs are clear.  No osseous abnormality.  No effusions.  IMPRESSION: No acute abnormality.  New pacemaker in place.   Original Report Authenticated By: Francene Boyers, M.D.   Ct Angio Chest Pe W/cm &/or Wo  Cm  02/20/2013   *RADIOLOGY REPORT*  Clinical Data: Chest pain.  Pacemaker insertion.  CT ANGIOGRAPHY CHEST  Technique:  Multidetector CT imaging of the chest using the standard protocol during bolus administration of intravenous contrast. Multiplanar reconstructed images including MIPs were obtained and reviewed to evaluate the vascular anatomy.  Contrast: 80mL OMNIPAQUE IOHEXOL 350 MG/ML SOLN  Comparison: None.  Findings: There are no filling defects in the pulmonary arterial tree to suggest acute pulmonary thromboembolism.  No evidence of intramural hematoma or aortic dissection.  Dual lead left subclavian pacemaker device is in place.  No pericardial effusion or mediastinal hemorrhage.  No pneumothorax or pleural effusion. Lungs are clear other than scattered ground-glass secondary to volume loss.  No acute bony deformity.  Small T7-T8 central disc protrusion. Left thyroid lobe is heterogeneous without focal nodule.  IMPRESSION: No evidence of acute pulmonary thromboembolism.   Original Report Authenticated By: Jolaine Click, M.D.    EKG:  P-synchronous/ AV  pacing  CXR: retraction of the RV lead; good RA lead placement  Extensive pacer interrogation:: Normal device function  There are two v signals on the atrial channel which i have not seen before When programmed VDI, serendipitious As prior to Vp resulted in that same dual signal when the atrium should have been refractory, demonstrating  the V origin of the signal and confirming that it was not V>>A conduction  The relative proximity of the RVlead to TA may help explain some of the egrams   Assessment and Plan:   Normal device function Significant symptoms assoc with V pacing as opposed to Vs events; also some symptoms with rapid atrial pacing  Her symptoms might derive from dyssynchronous LV contraction from pacing induced LBBB and I have suggested that she try to accomodate to this, but if not we could consider the insertion of an LV lead with attendant risk of infection and unlcear benefit.  Also aniticpate ongoing eval as to the source of heart block in this young lady.  Review her CT for hilar adenopathy, consider thallium imaging to exclude amyloid or sarcoid, lyme titres etc--         Sherryl Manges

## 2013-02-20 NOTE — Progress Notes (Signed)
Pt stated that she did not want am bmet to be drawn. Lab made aware of pt's request.

## 2013-02-20 NOTE — Discharge Summary (Signed)
Discharge Summary   Patient ID: RITIKA HELLICKSON,  MRN: [id number], DOB/AGE: 1951/03/08 62 y.o.  Admit date: 02/19/2013 Discharge date: 02/20/2013  Primary Physician: Levon Hedger, MD Primary Cardiologist: G. Ladona Ridgel, MD  Discharge Diagnoses Principal Problem:   Chest tightness Active Problems:   Mobitz type II atrioventricular block   Palpitations   S/P cardiac pacemaker procedure   GERD (gastroesophageal reflux disease)   Herpes zoster conjunctivitis   Allergies Allergies  Allergen Reactions  . Ceftin Hives  . Levofloxacin Other (See Comments)    "spacey"  . Vancomycin Hives    Diagnostic Studies/Procedures  PA/LATERAL CHEST X-RAY - 02/19/13  IMPRESSION:  No active cardiopulmonary disease. Dual lead pacemaker grossly  unchanged.  History of Present Illness   [place] Frommelt is a 62 y.o. female who was hospitalized overnight at [facility] on 02/19/13.  She is a 62 year old Caucasian female with a past medical history significant for GERD, asthma and recent admission for fatigue and weakness in the setting of Mobitz type II heart block. Her heart rate was found to be in the 40s and she was noted to be in a 2:1 heart block. Evaluation as to the etiology of the conduction disturbance was suspected to be idiopathic as no secondary cause was identified. She underwent the implantation of a [employer] dual-chamber permanent pacemaker on 02/17/13. She tolerated the procedure well without postoperative complications and was discharged the following day.  Sheet return to the emergency department today after complaining of "pounding in my chest" and associated chest heaviness. She also noted tachycardia (heart rate 110 beats per minute).  An emergency department, the [facility] Scientific rep  interrogated her device. This revealed atrial tracking and the pacing without arrhythmias. During the interrogation, she did develop chest discomfort, and a magnet was  placed on the generator with some improvement. Chest x-ray as above indicated no active cardiopulmonary disease or interval change in device placement. Initial troponin I returned within normal limits. She was observed overnight for formal rule out.  Hospital Course   Two subsequent sets of troponin I were obtained and returned within normal limits. TSH returned within normal limits She was evaluated by Dr. Graciela Husbands this morning who performed an extensive interrogation of her device. He concluded that she was quite sensitive to ventricular pacing and rapid atrial pacing. He suspected that her symptoms might derive from dyssynchronous LV contraction from pacing induced left bundle branch block. One option was recommended to consider the insertion of an LV lead, however this, risk of infection and unclear benefit. He also recommended further evaluation as to the source of the patient's heart block given no history of cardiac disease. She recommended thallium imaging and Lyme titers to an exclude infiltrative process. This was based on a prior chest CT revealing hilar adenopathy. He deemed the patient stable for discharge otherwise. There will be no changes made to her outpatient medications. She will followup as previously scheduled for wound check on 7/7. Electrophysiology followup will also be arranged in approximately 3 months. This information, including supplemental dual-chamber pacemaker educational material, has been "outlined in the discharge AVS.  Discharge Vitals:  Blood pressure 114/69, pulse 63, temperature 97.7 F (36.5 C), temperature source Oral, resp. rate 16, height 5\' 8"  (1.727 m), weight 72.712 kg (160 lb 4.8 oz), SpO2 94.00%.   Labs: Recent Labs     02/19/13  1523  02/19/13  2309  WBC   --   8.8  HGB  15.6*  14.6  HCT  46.0  41.8  MCV   --   90.9  PLT   --   247   Recent Labs     02/19/13  1609  DDIMER  0.84*    Recent Labs Lab 02/17/13 0632 02/19/13 1523  NA 139 141  K  3.7 3.8  CL 104 102  CO2 27  --   BUN 16 11  CREATININE 0.74 0.90  CALCIUM 9.6  --   GLUCOSE 100* 99   Recent Labs     02/19/13  1609  02/19/13  2306  02/20/13  0523  TROPONINI  <0.30  <0.30  <0.30   Recent Labs  02/19/13 1609  TSH 0.826    Disposition:   Future Appointments Provider Department Dept Phone   02/28/2013 9:30 AM [facility] [address] [place] [employer] Main Office [place]) [phone]         Follow-up Information   Follow up with [facility] On 02/28/2013. (At 9:30 AM for wound care check as previously scheduled. )    Contact information:   [address] [place] [address]       Follow up with [facility] In 3 months. (For pacemaker follow-up. )    Contact information:   [address] [place] [address]       Discharge Medications:    Medication List         albuterol 90 MCG/ACT inhaler  Commonly known as:  PROVENTIL,VENTOLIN  Inhale 2 puffs into the lungs every 4 (four) hours as needed. For wheezing     aspirin 81 MG tablet  Take 81 mg by mouth daily.     Fish Oil 600 MG Caps  Take 4 capsules by mouth daily.     fluticasone 50 MCG/ACT nasal spray  Commonly known as:  FLONASE  Place 2 sprays into the nose daily as needed for rhinitis. For seasonal allergies     HYDROcodone-acetaminophen 5-325 MG per tablet  Commonly known as:  NORCO/VICODIN  Take 1 tablet by mouth 3 (three) times daily as needed for pain. For pain     multivitamin with minerals Tabs  Take 1 tablet by mouth daily.     pantoprazole 40 MG tablet  Commonly known as:  PROTONIX  Take 1 tablet (40 mg total) by mouth daily.     PROBIOTIC FORMULA PO  Take 2 capsules by mouth every morning.     valACYclovir 500 MG tablet  Commonly known as:  VALTREX  Take 500 mg by mouth daily.       Outstanding Labs/Studies: None  Duration of Discharge Encounter: Greater than 30 minutes including physician time.  Signed, R. Hurman Horn, PA-C 02/20/2013, 9:44 AM

## 2013-02-20 NOTE — Care Management Utilization Note (Signed)
Utilization review completed.  

## 2013-02-20 NOTE — Telephone Encounter (Signed)
Error

## 2013-02-21 ENCOUNTER — Telehealth: Payer: Self-pay | Admitting: Internal Medicine

## 2013-02-21 DIAGNOSIS — R001 Bradycardia, unspecified: Secondary | ICD-10-CM | POA: Insufficient documentation

## 2013-02-21 DIAGNOSIS — B0233 Zoster keratitis: Secondary | ICD-10-CM | POA: Insufficient documentation

## 2013-02-21 LAB — T4, FREE: Free T4: 1.1 ng/dL (ref 0.80–1.80)

## 2013-02-21 NOTE — Telephone Encounter (Signed)
New Problem:    Patient called in wanting to speak with you about going to the hospital this weekend.  Please call back.

## 2013-02-21 NOTE — Telephone Encounter (Signed)
Went to the hospital with HR of 103 BP was up.  Dr Graciela Husbands came in and saw her and let her go home. Questions about reasons for PPM implant. ? Sarcoid/ lyme's Dr Graciela Husbands mentioned both as well as the ER MD

## 2013-02-22 ENCOUNTER — Encounter: Payer: Self-pay | Admitting: Family Medicine

## 2013-02-22 NOTE — Assessment & Plan Note (Addendum)
Kari [name]'s episodes of bradycardia are concerning so we will refer her to [facility] in the morning for an evaluation.  Her EKG did not show anything other than mild bradycardia. She was given a copy to take with her to her cardiac appointment in the morning.  She most likely will be put on an ambulatory heart monitor to catch one of these bradycardic episodes she has experienced.  If she worsens or becomes symptomatic she will go to the ED.

## 2013-02-22 NOTE — Telephone Encounter (Signed)
Spoke with patient, she is ok and feeling better.  Let her know her FMLA paper work has gone to [employer] Dr Malva Cogan will review and sign and we will fax back when done

## 2013-02-22 NOTE — Assessment & Plan Note (Addendum)
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  It sounds like she has had several episodes so I wonder if her outbreaks are actually HSV vs Herpes Zoster. Since her eye is affected, I think that it might not be a bad idea for her to take Valtrex daily for prevention of a recurrence.  I encouraged her to discuss this with her ophthalmologist the next time she has an appointment. She was given a prescription for Valtrex. We also discussed her getting the Zostavax.  She will also run this by her eye doctor and Dr. Constance Goltz.

## 2013-02-22 NOTE — Telephone Encounter (Signed)
Follow-up:    Called in wanting to know why she has not received a call back regarding her concerns yet.  Please call back

## 2013-02-28 ENCOUNTER — Encounter: Payer: Self-pay | Admitting: *Deleted

## 2013-02-28 ENCOUNTER — Ambulatory Visit (INDEPENDENT_AMBULATORY_CARE_PROVIDER_SITE_OTHER): Payer: 59 | Admitting: *Deleted

## 2013-02-28 DIAGNOSIS — R001 Bradycardia, unspecified: Secondary | ICD-10-CM

## 2013-02-28 DIAGNOSIS — I498 Other specified cardiac arrhythmias: Secondary | ICD-10-CM

## 2013-02-28 LAB — PACEMAKER DEVICE OBSERVATION
AL AMPLITUDE: 5.1 mv
AL IMPEDENCE PM: 490 Ohm
AL THRESHOLD: 1.2 V
RV LEAD AMPLITUDE: 25 mv
RV LEAD IMPEDENCE PM: 582 Ohm

## 2013-02-28 NOTE — Progress Notes (Signed)
Wound check-PPM in office. 

## 2013-03-01 ENCOUNTER — Telehealth: Payer: Self-pay | Admitting: Internal Medicine

## 2013-03-01 NOTE — Telephone Encounter (Signed)
Called patient and advised that Dr.Taylor has signed the [employer] form and we will fax to [phone]. She states that she also faxed to [place] a form from Wyman Songster [facility] Specialist). Advised that we do not have this form in Dr.Taylor's folder to sign. She thinks it might be held up in [employer]. Patient will fax this form to our office.

## 2013-03-01 NOTE — Telephone Encounter (Signed)
Follow Up     Pt calling in following up on FMLA information needed to sent to [employer]. Please call.

## 2013-03-02 NOTE — Telephone Encounter (Signed)
Follow-up:    Patient called in wanting to know about her paperwork.  Please call back.

## 2013-03-03 ENCOUNTER — Telehealth: Payer: Self-pay | Admitting: Internal Medicine

## 2013-03-03 NOTE — Telephone Encounter (Signed)
Called patient back. She states that Dr.Taylor filled out [employer] paperwork incorrectly. Symptoms first appeared on 6/21 and her RTW is scheduled for 7/14. She will bring over new form to complete. States that she faxed FMLA form to [place] this am but cannot be found at this time.

## 2013-03-03 NOTE — Telephone Encounter (Signed)
Patient brought back a new form from [facility] for [place] to complete. Corrected dates applied/new form signed by Dr.Taylor. Kim from medical records will fax form and also mail to patient.  I left a message for Wyman Songster concerning FMLA paperwork which so far we can't seem to locate to see if she can fax [place] a form.

## 2013-03-03 NOTE — Telephone Encounter (Signed)
Another [employer] was Completed By Dr.Taylor/and Signed the 1st paper was done Incorrect Faxed to [employer] [phone] Per Annice Pih and Mailed pt Original Copy  03/03/13/KM

## 2013-03-03 NOTE — Telephone Encounter (Signed)
Received FMLA form from [employer] Dept. Form completed. Need Dr.Taylor to sign. He is not back in the office until 7/15. Called patient and left message on her private answering machine with above information.

## 2013-03-08 ENCOUNTER — Telehealth: Payer: Self-pay | Admitting: Internal Medicine

## 2013-03-08 NOTE — Telephone Encounter (Signed)
FMLA signed By Wilburn Mylar to [employer] at [phone] LMOVM For pt Letting her Know ready For Pick Up 03/08/13/KM

## 2013-03-10 ENCOUNTER — Telehealth: Payer: Self-pay | Admitting: Internal Medicine

## 2013-03-10 ENCOUNTER — Ambulatory Visit (INDEPENDENT_AMBULATORY_CARE_PROVIDER_SITE_OTHER): Payer: 59 | Admitting: *Deleted

## 2013-03-10 DIAGNOSIS — I441 Atrioventricular block, second degree: Secondary | ICD-10-CM

## 2013-03-10 NOTE — Progress Notes (Signed)
Wound recheck for the patient's concerns of redness of the incision line.  The site was also examined by Dr. Graciela Husbands and no further action was necessary.

## 2013-03-10 NOTE — Telephone Encounter (Signed)
New problem  Pt states she has notice a place on her wound that looks a little suspect. She wants to speak with a nurse.

## 2013-03-10 NOTE — Telephone Encounter (Signed)
Pt scheduled for wound check 03-10-13 @ 1500.

## 2013-03-11 ENCOUNTER — Encounter (HOSPITAL_COMMUNITY): Payer: Self-pay

## 2013-03-11 ENCOUNTER — Encounter: Payer: Self-pay | Admitting: Internal Medicine

## 2013-03-11 ENCOUNTER — Emergency Department (HOSPITAL_COMMUNITY): Payer: 59

## 2013-03-11 ENCOUNTER — Emergency Department (HOSPITAL_COMMUNITY)
Admission: EM | Admit: 2013-03-11 | Discharge: 2013-03-11 | Disposition: A | Discharge status: Home / Self Care | Payer: 59 | Attending: Emergency Medicine | Admitting: Emergency Medicine

## 2013-03-11 ENCOUNTER — Encounter: Payer: Self-pay | Admitting: Cardiovascular Disease

## 2013-03-11 DIAGNOSIS — Z79899 Other long term (current) drug therapy: Secondary | ICD-10-CM | POA: Insufficient documentation

## 2013-03-11 DIAGNOSIS — M129 Arthropathy, unspecified: Secondary | ICD-10-CM | POA: Insufficient documentation

## 2013-03-11 DIAGNOSIS — Z8679 Personal history of other diseases of the circulatory system: Secondary | ICD-10-CM | POA: Insufficient documentation

## 2013-03-11 DIAGNOSIS — Z8719 Personal history of other diseases of the digestive system: Secondary | ICD-10-CM | POA: Insufficient documentation

## 2013-03-11 DIAGNOSIS — R002 Palpitations: Secondary | ICD-10-CM | POA: Insufficient documentation

## 2013-03-11 DIAGNOSIS — K219 Gastro-esophageal reflux disease without esophagitis: Secondary | ICD-10-CM | POA: Insufficient documentation

## 2013-03-11 DIAGNOSIS — Z8619 Personal history of other infectious and parasitic diseases: Secondary | ICD-10-CM | POA: Insufficient documentation

## 2013-03-11 DIAGNOSIS — Z950 Presence of cardiac pacemaker: Secondary | ICD-10-CM | POA: Insufficient documentation

## 2013-03-11 DIAGNOSIS — T829XXA Unspecified complication of cardiac and vascular prosthetic device, implant and graft, initial encounter: Secondary | ICD-10-CM

## 2013-03-11 DIAGNOSIS — R55 Syncope and collapse: Secondary | ICD-10-CM | POA: Diagnosis present

## 2013-03-11 DIAGNOSIS — Z8742 Personal history of other diseases of the female genital tract: Secondary | ICD-10-CM | POA: Insufficient documentation

## 2013-03-11 DIAGNOSIS — T82190A Other mechanical complication of cardiac electrode, initial encounter: Secondary | ICD-10-CM | POA: Insufficient documentation

## 2013-03-11 DIAGNOSIS — Y831 Surgical operation with implant of artificial internal device as the cause of abnormal reaction of the patient, or of later complication, without mention of misadventure at the time of the procedure: Secondary | ICD-10-CM | POA: Insufficient documentation

## 2013-03-11 DIAGNOSIS — Z8701 Personal history of pneumonia (recurrent): Secondary | ICD-10-CM | POA: Insufficient documentation

## 2013-03-11 DIAGNOSIS — R61 Generalized hyperhidrosis: Secondary | ICD-10-CM | POA: Insufficient documentation

## 2013-03-11 DIAGNOSIS — R42 Dizziness and giddiness: Secondary | ICD-10-CM | POA: Insufficient documentation

## 2013-03-11 DIAGNOSIS — Z7982 Long term (current) use of aspirin: Secondary | ICD-10-CM | POA: Insufficient documentation

## 2013-03-11 LAB — CBC WITH DIFFERENTIAL/PLATELET
Basophils Absolute: 0 10*3/uL (ref 0.0–0.1)
Basophils Relative: 1 % (ref 0–1)
Eosinophils Absolute: 0.1 10*3/uL (ref 0.0–0.7)
MCH: 31.6 pg (ref 26.0–34.0)
MCHC: 35.2 g/dL (ref 30.0–36.0)
Neutrophils Relative %: 44 % (ref 43–77)
Platelets: 253 10*3/uL (ref 150–400)
RBC: 4.14 MIL/uL (ref 3.87–5.11)
RDW: 14.2 % (ref 11.5–15.5)

## 2013-03-11 LAB — COMPREHENSIVE METABOLIC PANEL
ALT: 34 U/L (ref 0–35)
Albumin: 3.8 g/dL (ref 3.5–5.2)
Alkaline Phosphatase: 76 U/L (ref 39–117)
Calcium: 9.6 mg/dL (ref 8.4–10.5)
Potassium: 3.3 mEq/L — ABNORMAL LOW (ref 3.5–5.1)
Sodium: 140 mEq/L (ref 135–145)
Total Protein: 7.5 g/dL (ref 6.0–8.3)

## 2013-03-11 LAB — URINALYSIS, ROUTINE W REFLEX MICROSCOPIC
Bilirubin Urine: NEGATIVE
Glucose, UA: NEGATIVE mg/dL
Hgb urine dipstick: NEGATIVE
Protein, ur: NEGATIVE mg/dL
Urobilinogen, UA: 0.2 mg/dL (ref 0.0–1.0)

## 2013-03-11 LAB — MAGNESIUM: Magnesium: 1.7 mg/dL (ref 1.5–2.5)

## 2013-03-11 LAB — TROPONIN I: Troponin I: <0.3 ng/mL (ref <0.30)

## 2013-03-11 MED ORDER — MAGNESIUM OXIDE 400 (241.3 MG) MG PO TABS
400.0000 mg | ORAL_TABLET | ORAL | Status: AC
  Administered 2013-03-11: 400 mg via ORAL
  Filled 2013-03-11: qty 1

## 2013-03-11 MED ORDER — SODIUM CHLORIDE 0.9 % IV BOLUS (SEPSIS)
1000.0000 mL | Freq: Once | INTRAVENOUS | Status: AC
  Administered 2013-03-11: 1000 mL via INTRAVENOUS

## 2013-03-11 MED ORDER — LORAZEPAM 2 MG/ML IJ SOLN
INTRAMUSCULAR | Status: AC
  Filled 2013-03-11: qty 1

## 2013-03-11 MED ORDER — POTASSIUM CHLORIDE CRYS ER 20 MEQ PO TBCR
40.0000 meq | EXTENDED_RELEASE_TABLET | Freq: Once | ORAL | Status: AC
  Administered 2013-03-11: 40 meq via ORAL
  Filled 2013-03-11: qty 2

## 2013-03-11 MED ORDER — LORAZEPAM 2 MG/ML IJ SOLN
0.5000 mg | Freq: Once | INTRAMUSCULAR | Status: AC
  Administered 2013-03-11: 0.5 mg via INTRAVENOUS

## 2013-03-11 NOTE — ED Provider Notes (Signed)
History    CSN: [id number] Arrival date & time 03/11/13  1041  First MD Initiated Contact with Patient 03/11/13 1049     Chief Complaint  Patient presents with  . Fatigue   (Consider location/radiation/quality/duration/timing/severity/associated sxs/prior Treatment) HPI Comments: Patient presents by EMS from classroom where she had acute onset of diaphoresis, dizziness and lightheadedness. Contrary to triage note no shortness of breath. No chest pain. She had a pacemaker placed June 25 for AV block. She's not had these sensations before. Denies any recent medication changes. Denies any fevers, chills, cough abdominal pain, nausea or vomiting. No leg pain or swelling. The sensation comes and goes in waves it has been ongoing for the past hour and a half. Denies any headache, visual change, chest pain or shortness of breath.  The history is provided by the patient and the EMS personnel.   Past Medical History  Diagnosis Date  . Herpes zoster conjunctivitis   . Menopause   . Diverticulosis   . Pneumonia 2009  . GERD (gastroesophageal reflux disease)     "related to NSAID RX only" (02/17/2013)  . Arthritis     "hands" (02/17/2013)  . Seasonal allergies     "spring & fall; not q year" (02/17/2013)  . Mobitz type 2 second degree AV block     2:1/notes 02/16/2013   Past Surgical History  Procedure Laterality Date  . Vaginal hysterectomy  ~ 1994  . Arthrogram knee Right 1975  . Insert / replace / remove pacemaker  02/17/2013    [facility] [employer] dual-chamber pacemaker, model [id number],  . Tonsillectomy  1961  . Cholecystectomy  1990's  . Knee arthroscopy Right 1982, 1983, 1995, 1996  . Knee arthroscopy w/ acl reconstruction Left 1984  . 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 Trigger finger release Right ~ 2012    "thumb" (02/17/2013)   Family History  Problem Relation Age of Onset  . Hypertension Mother   .  Hyperlipidemia Mother   . Macular degeneration Mother   . COPD Mother   . Lung cancer Father   . Cancer Father     Lung  . COPD Father   . Diabetes Brother   . Breast cancer Paternal Aunt    History  Substance Use Topics  . Smoking status: Never Smoker   . Smokeless tobacco: Never Used  . Alcohol Use: Yes     Comment: 02/17/2013 '4oz wine q couple weeks"   OB History   Grav Para Term Preterm Abortions TAB SAB Ect Mult Living   1 1             Review of Systems  Constitutional: Positive for activity change and fatigue. Negative for fever.  HENT: Negative for congestion and rhinorrhea.   Respiratory: Negative for choking, chest tightness and shortness of breath.   Cardiovascular: Positive for palpitations. Negative for chest pain.  Gastrointestinal: Negative for nausea, vomiting and abdominal pain.  Genitourinary: Negative for dysuria and hematuria.  Musculoskeletal: Negative for back pain.  Skin: Negative for rash.  Neurological: Positive for dizziness, weakness and light-headedness. Negative for headaches.  A complete 10 system review of systems was obtained and all systems are negative except as noted in the HPI and PMH.    Allergies  Ceftin; Levofloxacin; and Vancomycin  Home Medications   Current Outpatient Rx  Name  Route  Sig  Dispense  Refill  . aspirin EC 81 MG tablet   Oral  Take 81 mg by mouth daily.         Marland Kitchen esomeprazole (NEXIUM) 40 MG capsule   Oral   Take 40 mg by mouth daily before breakfast.         . HYDROcodone-acetaminophen (NORCO/VICODIN) 5-325 MG per tablet   Oral   Take 1 tablet by mouth 3 (three) times daily as needed for pain. For pain         . Multiple Vitamin (MULTIVITAMIN WITH MINERALS) TABS   Oral   Take 1 tablet by mouth daily.         . Omega-3 Fatty Acids (FISH OIL) 600 MG CAPS   Oral   Take 4 capsules by mouth daily.         . Probiotic Product (PROBIOTIC FORMULA PO)   Oral   Take 2 capsules by mouth every  morning.          . valACYclovir (VALTREX) 500 MG tablet   Oral   Take 500 mg by mouth daily.          BP 148/82  Pulse 68  Temp(Src) 98.1 F (36.7 C) (Oral)  Resp 20  SpO2 99% Physical Exam  Constitutional: She is oriented to person, place, and time. She appears well-developed and well-nourished. No distress.  HENT:  Head: Normocephalic and atraumatic.  Mouth/Throat: Oropharynx is clear and moist. No oropharyngeal exudate.  Eyes: Conjunctivae and EOM are normal.  Neck: Normal range of motion. Neck supple.  Cardiovascular: Normal rate, regular rhythm and normal heart sounds.   No murmur heard. Pulmonary/Chest: Effort normal and breath sounds normal. No respiratory distress.  Some pain at pacemaker site  Abdominal: Soft. There is no tenderness. There is no rebound and no guarding.  Musculoskeletal: Normal range of motion. She exhibits no edema and no tenderness.  Neurological: She is alert and oriented to person, place, and time. No cranial nerve deficit. She exhibits normal muscle tone. Coordination normal.  CN 2-12 intact, no ataxia on finger to nose, no nystagmus, 5/5 strength throughout, no pronator drift, Romberg negative, normal gait.   Skin: Skin is warm.    ED Course  Procedures (including critical care time) Labs Reviewed  COMPREHENSIVE METABOLIC PANEL - Abnormal; Notable for the following:    Potassium 3.3 (*)    Glucose, Bld 103 (*)    All other components within normal limits  CBC WITH DIFFERENTIAL  TROPONIN I  PRO B NATRIURETIC PEPTIDE  URINALYSIS, ROUTINE W REFLEX MICROSCOPIC  MAGNESIUM   Dg Chest 2 View  03/11/2013   *RADIOLOGY REPORT*  Clinical Data: Chest pain with palpitations.  CHEST - 2 VIEW  Comparison: CT chest of 02/20/2013.  Chest x-ray from 02/19/2013.  Findings: The lungs are clear without focal infiltrate, edema, pneumothorax or pleural effusion. The cardiopericardial silhouette is within normal limits for size.  Left-sided dual lead  permanent pacemaker again noted. Imaged bony structures of the thorax are intact. Telemetry leads overlie the chest.  IMPRESSION: Stable.  No acute cardiopulmonary findings.   Original Report Authenticated By: Kennith Center, M.D.   1. Palpitations   2. Pacemaker complications, initial encounter     MDM  Episode of diaphoresis, weakness, lightheadedness, near syncope while sitting in class. No chest pain or shortness of breath. Recent pacemaker placement  Patient is not paced on arrival. Patient was interrogated and found to have no significant arrhythmias. When biventricular pacing switches on, patient become extremely uncomfortable with nausea dizziness and anxiety. No chest pain or shortness of breath.  electrolytes are unremarkable. Troponin is negative.  Cardiology has evaluated the patient. Her symptoms correlate with pacing activity. HEr device is adjusted to minimize pacing. Patient comfortable with these settings and stable for followup in pacer clinic.   Date: 03/11/2013  Rate: 63  Rhythm: normal sinus rhythm  QRS Axis: normal  Intervals: normal  ST/T Wave abnormalities: nonspecific ST changes  Conduction Disutrbances:none  Narrative Interpretation: dual chamber pacing on previous EKG.  Old EKG Reviewed: changes noted   Date: 03/11/2013  Rate: 76  Rhythm: paced  QRS Axis: normal  Intervals: normal  ST/T Wave abnormalities: normal  Conduction Disutrbances:none  Narrative Interpretation:   Old EKG Reviewed: changes noted         Glynn Octave, MD 03/11/13 1814

## 2013-03-11 NOTE — ED Notes (Signed)
Dr. Rancour and phlebotomy at the bedside.  

## 2013-03-11 NOTE — ED Notes (Signed)
Dr. Manus Gunning back at the bedside to make aware cardiology coming to see patient.

## 2013-03-11 NOTE — ED Notes (Signed)
Pt being interrogated by [employer]

## 2013-03-11 NOTE — ED Notes (Signed)
Pt's rhythm converted back to NSR on monitor after placing pacing pads on patient.

## 2013-03-11 NOTE — Consult Note (Signed)
CARDIOLOGY CONSULT NOTE   Patient ID: Kari [name] MRN: [id number] DOB/AGE: [birth year] 62 y.o.  Admit date: 03/11/2013  Primary Physician   Levon Hedger, MD Primary Cardiologist   GT Reason for Consultation   Presyncope, PPM  Kari [name] is a 62 y.o. female with no history of CAD.  She had 2:1 block with ventricular rates in the 30s. She was very symptomatic with this and had a PPM place on 02/17/2013. She was admitted again for chest heaviness in association with ventricular pacing and a HR close to 100. The device was adjusted and she was discharged. She was feeling better and was able to exercise last pm.   This am, she was in a class and had onset of presyncope and flushed feeling. She was diaphoretic and SOB but did not have chest pain. She came to the ER and was pacing at times that correlated with her symptoms. The pacing was appropriate and her HR was never pacing > 100. Her symptoms would instantly resolve when she began conducting 1:1.    Past Medical History  Diagnosis Date  . Herpes zoster conjunctivitis   . Menopause   . Diverticulosis   . Pneumonia 2009  . GERD (gastroesophageal reflux disease)     "related to NSAID RX only" (02/17/2013)  . Arthritis     "hands" (02/17/2013)  . Seasonal allergies     "spring & fall; not q year" (02/17/2013)  . Mobitz type 2 second degree AV block     2:1/notes 02/16/2013     Past Surgical History  Procedure Laterality Date  . Vaginal hysterectomy  ~ 1994  . Arthrogram knee Right 1975  . Insert / replace / remove pacemaker  02/17/2013    [facility] Scientific Advantio dual-chamber pacemaker, model [id number],  . Tonsillectomy  1961  . Cholecystectomy  1990's  . Knee arthroscopy Right 1982, 1983, 1995, 1996  . Knee arthroscopy w/ acl reconstruction Left 1984  . 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 Trigger finger release Right ~ 2012    "thumb" (02/17/2013)     Allergies  Allergen Reactions  . Ceftin Hives  . Levofloxacin Other (See Comments)    "spacey"  . Vancomycin Hives    I have reviewed the patient's current medications       Prior to Admission medications   Medication Sig Start Date End Date Taking? Authorizing Provider  aspirin EC 81 MG tablet Take 81 mg by mouth daily.   Yes Historical Provider, MD  esomeprazole (NEXIUM) 40 MG capsule Take 40 mg by mouth daily before breakfast.   Yes Historical Provider, MD  HYDROcodone-acetaminophen (NORCO/VICODIN) 5-325 MG per tablet Take 1 tablet by mouth 3 (three) times daily as needed for pain. For pain   Yes Historical Provider, MD  Multiple Vitamin (MULTIVITAMIN WITH MINERALS) TABS Take 1 tablet by mouth daily.   Yes Historical Provider, MD  Omega-3 Fatty Acids (FISH OIL) 600 MG CAPS Take 4 capsules by mouth daily.   Yes Historical Provider, MD  Probiotic Product (PROBIOTIC FORMULA PO) Take 2 capsules by mouth every morning.    Yes Historical Provider, MD  valACYclovir (VALTREX) 500 MG tablet Take 500 mg by mouth daily. 02/14/13 02/14/14  Gillian Scarce, MD     History   Social History  . Marital Status: Married    Spouse Name: N/A    Number of Children: N/A  . Years of Education: N/A   Occupational  History  . Not on file.   Social History Main Topics  . Smoking status: Never Smoker   . Smokeless tobacco: Never Used  . Alcohol Use: Yes     Comment: 02/17/2013 '4oz wine q couple weeks"  . Drug Use: No  . Sexually Active: Not on file     Comment: 02/17/2013 "I have a same sex partner"   Other Topics Concern  . Not on file   Social History Narrative  . No narrative on file    Family Status  Relation Status Death Age  . Mother Deceased   . Father Deceased    Family History  Problem Relation Age of Onset  . Hypertension Mother   . Hyperlipidemia Mother   . Macular degeneration Mother   . COPD Mother   . Lung cancer Father   . Cancer Father     Lung  . COPD Father    . Diabetes Brother   . Breast cancer Paternal Aunt      ROS:  Full 14 point review of systems complete and found to be negative unless listed above.  Physical Exam: Blood pressure 148/82, pulse 68, temperature 98.1 F (36.7 C), temperature source Oral, resp. rate 20, SpO2 99.00%.  General: Well developed, well nourished, female in no acute distress Head: Eyes PERRLA, No xanthomas.   Normocephalic and atraumatic, oropharynx without edema or exudate. Dentition: good Lungs: CTA bilaterally Heart: HRRR S1 S2, no rub/gallop, no significant murmur. pulses are 2+ all 4 extrem.   Neck: No carotid bruits. No lymphadenopathy.  JVD not elevated. Abdomen: Bowel sounds present, abdomen soft and non-tender without masses or hernias noted. Msk:  No spine or cva tenderness. No weakness, no joint deformities or effusions. Extremities: No clubbing or cyanosis. no edema.  Neuro: Alert and oriented X 3. No focal deficits noted. Psych:  Good affect, responds appropriately Skin: No rashes or lesions noted.  Labs:   Lab Results  Component Value Date   WBC 6.0 03/11/2013   HGB 13.1 03/11/2013   HCT 37.2 03/11/2013   MCV 89.9 03/11/2013   PLT 253 03/11/2013   No results found for this basename: INR,  in the last 72 hours   Recent Labs Lab 03/11/13 1111  NA 140  K 3.3*  CL 102  CO2 26  BUN 15  CREATININE 0.72  CALCIUM 9.6  PROT 7.5  BILITOT 0.5  ALKPHOS 76  ALT 34  AST 25  GLUCOSE 103*   Magnesium  Date Value Range Status  03/11/2013 1.7  1.5 - 2.5 mg/dL Final    Recent Labs  40/98/11 1111  TROPONINI <0.30    Pro B Natriuretic peptide (BNP)  Date/Time Value Range Status  03/11/2013 11:11 AM 55.7  0 - 125 pg/mL Final   TSH  Date/Time Value Range Status  02/19/2013  4:09 PM 0.826  0.350 - 4.500 uIU/mL Final    ECG: SR/V pacing with occasional AV pacing  Radiology:  Dg Chest 2 View  03/11/2013   *RADIOLOGY REPORT*  Clinical Data: Chest pain with palpitations.  CHEST - 2 VIEW   Comparison: CT chest of 02/20/2013.  Chest x-ray from 02/19/2013.  Findings: The lungs are clear without focal infiltrate, edema, pneumothorax or pleural effusion. The cardiopericardial silhouette is within normal limits for size.  Left-sided dual lead permanent pacemaker again noted. Imaged bony structures of the thorax are intact. Telemetry leads overlie the chest.  IMPRESSION: Stable.  No acute cardiopulmonary findings.   Original Report Authenticated By:  Kennith Center, M.D.    ASSESSMENT AND PLAN:   The patient was seen today by Dr Eden Emms, the patient evaluated and the data reviewed.  Principal Problem:   Near syncope - Phone conversation with SK, pacemaker interrogation and multiple parameter changes. All data reviewed by Dr Eden Emms and Dr Graciela Husbands. Final results are that pt is going in/out of 2:1 block and the device is responding appropriately. However, she is very symptomatic when she is pacing, so the best way to minimize the pacing is to put the device in VVI mode at 50 bpm. Pt was observed going in/out of block and would have the fewest paced beats before going back into 1:1 conduction at this mode. Lengthy explanation to patient regarding the ramifications of this and she is in agreement.   Her K+ is slightly low at 3.2 and her Mg is borderline at 1.7, will supp both but do not feel OP Rx needed. Pt encouraged to hydrate with/after exertion.    SignedTheodore Demark, PA-C 03/11/2013 4:00 PM Beeper [phone]  Co-Sign MD

## 2013-03-11 NOTE — ED Notes (Signed)
Pt placed on pacing pads at this time.

## 2013-03-11 NOTE — ED Notes (Signed)
Per EMS report pt was in a class when she began to have severe SOB and diaphoresis.  No distress on arrival.  Resp symmetrical and unlabored.

## 2013-03-11 NOTE — ED Notes (Signed)
ED tech at bedside to attempt orthostatic vitals and pt turned to sit on side of stretcher. Rhythm converted on monitor to wide complex Vtach. This RN made aware by other RT at bedside while tech remained with pt. This RN with MD back to pt's bedside. Pt denying chest pain or SOB at this time.

## 2013-03-11 NOTE — ED Notes (Signed)
Pt requesting to have [employer] come back to change back the settings he made changes to earlier.

## 2013-03-11 NOTE — ED Notes (Signed)
Pt states she doesn't feel good. She is starting to have cp and feel funny like she did before. This RN at the bedside with patient. Pt's rhythm on monitor converts into a wide complex VTach. Dr. Manus Gunning made aware and back at the bedside. EKG reshot.

## 2013-03-11 NOTE — ED Notes (Signed)
Pt returned from xray

## 2013-03-11 NOTE — ED Notes (Signed)
Bjorn Loser, [place] with cardiology and Barbara Cower with [employer] at the bedside.

## 2013-03-11 NOTE — Progress Notes (Signed)
Patient ID: Kari [name], female   DOB: 02-Jun-1951, 62 y.o.   MRN: [id number] Patient seen in ER with PA Lavella Hammock and Pacer tech Patient with presyncope and palpitations from recently implanted pacer Symptomatic when she goes into 2:1 AV block an p synch paces at rates over 90 Discussed with Dr Graciela Husbands  Pacing mode changed to VVI.  She was not symptomatic when going into 2:1 block And V pacing at rate of 60 with stable hemodynamics No retrograde P waves seen  CXR with good lead position Thresholds and impedence numbers are normal  Pocket site looks great  Rate response not turned on but has normal sinus node function at higher HR;s  Ok to discharge home with new pacer settings and will follow up in pacer clinic  With Dr Graciela Husbands for further adjustments  Charlton Haws 5:40 PM 03/11/2013

## 2013-03-12 ENCOUNTER — Encounter (HOSPITAL_COMMUNITY): Payer: Self-pay | Admitting: *Deleted

## 2013-03-12 ENCOUNTER — Emergency Department (HOSPITAL_COMMUNITY)
Admission: EM | Admit: 2013-03-12 | Discharge: 2013-03-12 | Disposition: A | Discharge status: Home / Self Care | Payer: 59 | Attending: Emergency Medicine | Admitting: Emergency Medicine

## 2013-03-12 ENCOUNTER — Telehealth: Payer: Self-pay | Admitting: Cardiology

## 2013-03-12 DIAGNOSIS — Y831 Surgical operation with implant of artificial internal device as the cause of abnormal reaction of the patient, or of later complication, without mention of misadventure at the time of the procedure: Secondary | ICD-10-CM | POA: Insufficient documentation

## 2013-03-12 DIAGNOSIS — R55 Syncope and collapse: Secondary | ICD-10-CM

## 2013-03-12 DIAGNOSIS — Z8709 Personal history of other diseases of the respiratory system: Secondary | ICD-10-CM | POA: Insufficient documentation

## 2013-03-12 DIAGNOSIS — T82190A Other mechanical complication of cardiac electrode, initial encounter: Secondary | ICD-10-CM | POA: Insufficient documentation

## 2013-03-12 DIAGNOSIS — Z8739 Personal history of other diseases of the musculoskeletal system and connective tissue: Secondary | ICD-10-CM | POA: Insufficient documentation

## 2013-03-12 DIAGNOSIS — Z79899 Other long term (current) drug therapy: Secondary | ICD-10-CM | POA: Insufficient documentation

## 2013-03-12 DIAGNOSIS — Z8701 Personal history of pneumonia (recurrent): Secondary | ICD-10-CM | POA: Insufficient documentation

## 2013-03-12 DIAGNOSIS — Z8679 Personal history of other diseases of the circulatory system: Secondary | ICD-10-CM | POA: Insufficient documentation

## 2013-03-12 DIAGNOSIS — K219 Gastro-esophageal reflux disease without esophagitis: Secondary | ICD-10-CM | POA: Insufficient documentation

## 2013-03-12 DIAGNOSIS — Z8742 Personal history of other diseases of the female genital tract: Secondary | ICD-10-CM | POA: Insufficient documentation

## 2013-03-12 DIAGNOSIS — T829XXA Unspecified complication of cardiac and vascular prosthetic device, implant and graft, initial encounter: Secondary | ICD-10-CM

## 2013-03-12 DIAGNOSIS — Z8719 Personal history of other diseases of the digestive system: Secondary | ICD-10-CM | POA: Insufficient documentation

## 2013-03-12 DIAGNOSIS — Z8619 Personal history of other infectious and parasitic diseases: Secondary | ICD-10-CM | POA: Insufficient documentation

## 2013-03-12 DIAGNOSIS — Z7982 Long term (current) use of aspirin: Secondary | ICD-10-CM | POA: Insufficient documentation

## 2013-03-12 LAB — POCT I-STAT, CHEM 8
BUN: 12 mg/dL (ref 6–23)
Calcium, Ion: 1.29 mmol/L (ref 1.13–1.30)
Chloride: 105 mEq/L (ref 96–112)
Glucose, Bld: 97 mg/dL (ref 70–99)
HCT: 40 % (ref 36.0–46.0)
Potassium: 4.2 mEq/L (ref 3.5–5.1)

## 2013-03-12 LAB — POCT I-STAT TROPONIN I: Troponin i, poc: 0 ng/mL (ref 0.00–0.08)

## 2013-03-12 MED ORDER — DILTIAZEM HCL ER COATED BEADS 180 MG PO CP24
180.0000 mg | ORAL_CAPSULE | Freq: Once | ORAL | Status: AC
  Administered 2013-03-12: 180 mg via ORAL
  Filled 2013-03-12: qty 1

## 2013-03-12 MED ORDER — DILTIAZEM HCL ER COATED BEADS 180 MG PO CP24: 180.0000 mg | ORAL_CAPSULE | Freq: Every day | ORAL | Status: DC

## 2013-03-12 NOTE — Telephone Encounter (Signed)
Kari [name] called to report she "is just not feeling well" and states she is extremely fatigued, SOB, dizzy and at times having "waves of faintness" since yesterday. She was at the [facility] ED yesterday afternoon for similar symptoms and her PPM was reprogrammed to VVI with lower rate of 50 bpm. Please see full consult note for full details. Kari [name] feels no better today. She is tearful and frustrated. She does not want to feel like this through the weekend. I instructed her to come to the [facility] ED now for further evaluation.

## 2013-03-12 NOTE — ED Provider Notes (Signed)
History    CSN: [id number] Arrival date & time 03/12/13  1700  First MD Initiated Contact with Patient 03/12/13 1709     Chief Complaint  Patient presents with  . pacemaker problems    (Consider location/radiation/quality/duration/timing/severity/associated sxs/prior Treatment) HPI  62 y.o. Female presents complaining of ongoing palpitations, "funny feeling in chest", weakness, that began yesterday.  She was seen here for similar symptoms yesterday and had pacemaker adjustments done.  She states today she has felt extremely weak and her symptoms have continued.  She spoke with the [place] pa on call and was told to come to ed for further evaluation.  She denies vision problems, dyspnea, nausea, vomiting or diarrhea.  She had the pacemaker placed last month for a mobitz type 2 block. Past Medical History  Diagnosis Date  . Herpes zoster conjunctivitis   . Menopause   . Diverticulosis   . Pneumonia 2009  . GERD (gastroesophageal reflux disease)     "related to NSAID RX only" (02/17/2013)  . Arthritis     "hands" (02/17/2013)  . Seasonal allergies     "spring & fall; not q year" (02/17/2013)  . Mobitz type 2 second degree AV block     2:1/notes 02/16/2013   Past Surgical History  Procedure Laterality Date  . Vaginal hysterectomy  ~ 1994  . Arthrogram knee Right 1975  . Insert / replace / remove pacemaker  02/17/2013    [facility] Scientific Advantio dual-chamber pacemaker, model [id number],  . Tonsillectomy  1961  . Cholecystectomy  1990's  . Knee arthroscopy Right 1982, 1983, 1995, 1996  . Knee arthroscopy w/ acl reconstruction Left 1984  . 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 Trigger finger release Right ~ 2012    "thumb" (02/17/2013)   Family History  Problem Relation Age of Onset  . Hypertension Mother   . Hyperlipidemia Mother   . Macular degeneration Mother   . COPD Mother   . Lung cancer Father   . Cancer Father      Lung  . COPD Father   . Diabetes Brother   . Breast cancer Paternal Aunt    History  Substance Use Topics  . Smoking status: Never Smoker   . Smokeless tobacco: Never Used  . Alcohol Use: Yes     Comment: 02/17/2013 '4oz wine q couple weeks"   OB History   Grav Para Term Preterm Abortions TAB SAB Ect Mult Living   1 1             Review of Systems  All other systems reviewed and are negative.    Allergies  Ceftin; Levofloxacin; and Vancomycin  Home Medications   Current Outpatient Rx  Name  Route  Sig  Dispense  Refill  . aspirin EC 81 MG tablet   Oral   Take 81 mg by mouth daily.         Marland Kitchen esomeprazole (NEXIUM) 40 MG capsule   Oral   Take 40 mg by mouth daily before breakfast.         . HYDROcodone-acetaminophen (NORCO/VICODIN) 5-325 MG per tablet   Oral   Take 1 tablet by mouth 3 (three) times daily as needed for pain. For pain         . Multiple Vitamin (MULTIVITAMIN WITH MINERALS) TABS   Oral   Take 1 tablet by mouth daily.         . Omega-3 Fatty Acids (FISH OIL)  600 MG CAPS   Oral   Take 4 capsules by mouth daily.         . Probiotic Product (PROBIOTIC FORMULA PO)   Oral   Take 2 capsules by mouth every morning.          . valACYclovir (VALTREX) 500 MG tablet   Oral   Take 500 mg by mouth daily.          BP 133/82  Pulse 61  Temp(Src) 98.2 F (36.8 C)  Resp 20  SpO2 100% Physical Exam  Nursing note and vitals reviewed. Constitutional: She appears well-developed and well-nourished.  HENT:  Head: Normocephalic and atraumatic.  Right Ear: External ear normal.  Left Ear: External ear normal.  Nose: Nose normal.  Mouth/Throat: Oropharynx is clear and moist.  Eyes: Conjunctivae and EOM are normal. Pupils are equal, round, and reactive to light.  Neck: Normal range of motion.  Cardiovascular: Normal rate, regular rhythm, normal heart sounds and intact distal pulses.   Pulmonary/Chest: Effort normal and breath sounds normal.   Abdominal: Soft. Bowel sounds are normal.    ED Course  Procedures (including critical care time) Labs Reviewed  MAGNESIUM   Dg Chest 2 View  03/11/2013   *RADIOLOGY REPORT*  Clinical Data: Chest pain with palpitations.  CHEST - 2 VIEW  Comparison: CT chest of 02/20/2013.  Chest x-Delle Andrzejewski from 02/19/2013.  Findings: The lungs are clear without focal infiltrate, edema, pneumothorax or pleural effusion. The cardiopericardial silhouette is within normal limits for size.  Left-sided dual lead permanent pacemaker again noted. Imaged bony structures of the thorax are intact. Telemetry leads overlie the chest.  IMPRESSION: Stable.  No acute cardiopulmonary findings.   Original Report Authenticated By: Kennith Center, M.D.   No diagnosis found.  Date: 03/12/2013  Rate: 70  Rhythm: normal sinus rhythm with ventricular paced beat  QRS Axis: normal  Intervals: normal  ST/T Wave abnormalities: t wave inversion v3-v6, II, Iii, and avF  Conduction Disutrbances:none  Narrative Interpretation:   Old EKG Reviewed: changes noted patient with similar ekg end of June, most recent paced   MDM  Patient care discussed with Dr. Antoine Poche and cardiology will evaluate.  Labs reviewed and all normal.  Dr. Antoine Poche saw plans to have pacemaker interrogated and adjustments made.  The tech will consult with Dr. Ladona Ridgel and then she can be discharged to home.   7:52 PM Dr. Ladona Ridgel is at bedside.   Hilario Quarry, MD 03/12/13 [phone]

## 2013-03-12 NOTE — ED Notes (Signed)
Cardiology at bedside.

## 2013-03-12 NOTE — ED Provider Notes (Signed)
Is a signout from Dr. Rosalia Hammers at shift change: Kari [name] is a 62 y.o. female complaining of chest pain after recent pacemaker insertion. Patient is being evaluated by cardiologist, Dr. Ladona Ridgel, who is adjusting the pacemaker he will advise on DC.  Cardiology consult from Dr. Ladona Ridgel appreciated: He believes the patient is appropriate for discharge at this time. He has asked that I give her a prescription for Cardizem, with 4 months of refills. I have also given her a single dose in the ED tonight at her request.  Pt is hemodynamically stable, appropriate for, and amenable to discharge at this time. Pt verbalized understanding and agrees with care plan. Outpatient follow-up and specific return precautions discussed.    New Prescriptions   DILTIAZEM (CARDIZEM CD) 180 MG 24 HR CAPSULE    Take 1 capsule (180 mg total) by mouth daily.    Wynetta Emery, PA-C 03/12/13 2103

## 2013-03-12 NOTE — ED Provider Notes (Signed)
Medical screening examination/treatment/procedure(s) were performed by non-physician practitioner and as supervising physician I was immediately available for consultation/collaboration.   Jovanie Verge, MD 03/12/13 2340 

## 2013-03-12 NOTE — ED Notes (Signed)
Pt was seen here yesterday for weakness and ringing in her ears. Pt states she had her pacemaker adjusted but has not been feeling better since. Pt alert and oriented.

## 2013-03-12 NOTE — ED Notes (Signed)
She has had a pacemaker  For 3 weeks and she has had intermittent weakness and ringing in her ears.  She was seen here yesterday for the same and her  Pacemaker was checked and adjusted

## 2013-03-12 NOTE — ED Notes (Signed)
Discharge instructions reviewed with pt. Pt verbalized understanding.   

## 2013-03-12 NOTE — Consult Note (Signed)
Reason for Consult:Palpitations   Referring Physician: Dr. Ranae [name] is an 62 y.o. female.   HPI: 62 yo woman well known to me presents to the ED with palpitations and chest pressure after her PPM was reprogrammed yesterday. She was reprogrammed to the VVI 50 mode yesterday. She had done well for the past 3 weeks. She experienced chest palpitations yesterday and had her device reprogrammed but felt terrible today and presents for additional eval. 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 I have reprogrammed her back to DDD and she feels better. Also tried VVI 35 and she felt worse. Part of the problem is that she has very good VA conduction after pacing and has retrograde atrial activation. These beats are blanked but I suspect the atrial activation against a closed TV results in symptoms in the neck.   PMH: Past Medical History  Diagnosis Date  . Herpes zoster conjunctivitis   . Menopause   . Diverticulosis   . Pneumonia 2009  . GERD (gastroesophageal reflux disease)     "related to NSAID RX only" (02/17/2013)  . Arthritis     "hands" (02/17/2013)  . Seasonal allergies     "spring & fall; not q year" (02/17/2013)  . Mobitz type 2 second degree AV block     2:1/notes 02/16/2013    PSHX: Past Surgical History  Procedure Laterality Date  . Vaginal hysterectomy  ~ 1994  . Arthrogram knee Right 1975  . Insert / replace / remove pacemaker  02/17/2013    [facility] [employer] Advantio dual-chamber pacemaker, model [id number],  . Tonsillectomy  1961  . Cholecystectomy  1990's  . Knee arthroscopy Right 1982, 1983, 1995, 1996  . Knee arthroscopy w/ acl reconstruction Left 1984  . 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 Trigger finger release Right ~ 2012    "thumb" (02/17/2013)    FAMHX: Family History  Problem Relation Age of Onset  . Hypertension Mother   .  Hyperlipidemia Mother   . Macular degeneration Mother   . COPD Mother   . Lung cancer Father   . Cancer Father     Lung  . COPD Father   . Diabetes Brother   . Breast cancer Paternal Aunt     Social History:  reports that she has never smoked. She has never used smokeless tobacco. She reports that  drinks alcohol. She reports that she does not use illicit drugs.  Allergies:  Allergies  Allergen Reactions  . Ceftin Hives  . Levofloxacin Other (See Comments)    "spacey"  . Vancomycin Hives    Medications: reviewed  Dg Chest 2 View  03/11/2013   *RADIOLOGY REPORT*  Clinical Data: Chest pain with palpitations.  CHEST - 2 VIEW  Comparison: CT chest of 02/20/2013.  Chest x-ray from 02/19/2013.  Findings: The lungs are clear without focal infiltrate, edema, pneumothorax or pleural effusion. The cardiopericardial silhouette is within normal limits for size.  Left-sided dual lead permanent pacemaker again noted. Imaged bony structures of the thorax are intact. Telemetry leads overlie the chest.  IMPRESSION: Stable.  No acute cardiopulmonary findings.   Original Report Authenticated By: Kennith Center, M.D.    ROS  As stated in the HPI and negative for all other systems.  Physical Exam  Vitals:Blood pressure 139/82, pulse 61, temperature 98.2 F (36.8 C), resp. rate 15, SpO2 100.00%.  Well appearing middle aged  woman, NAD HEENT: Unremarkable Neck:  7 cm JVD, no thyromegally Back:  No CVA tenderness Lungs:  Clear with no wheezes, well healed PPM incision. HEART:  Regular rate rhythm, no murmurs, no rubs, no clicks Abd:  Flat, positive bowel sounds, no organomegally, no rebound, no guarding Ext:  2 plus pulses, no edema, no cyanosis, no clubbing Skin:  No rashes no nodules Neuro:  CN II through XII intact, motor grossly intact  PM interogation - as noted above. She is now DDD 50/min.  Assessment/Plan: 1. Palpitations from PM programming 2. intermittant 2:1 AV block 3. Intact VA  conduction Rec: I have spent an hour diagnosing and discussing the problem with the patient. We will try cardizem in hopes of blocking her AV conduction. She will require early followup and I have asked her to call the office next week so that she can see me sooner than 2 months which is currently scheduled.  Kari [name],M.D.  Sharlot Gowda TaylorMD 03/12/2013, 8:17 PM

## 2013-03-15 ENCOUNTER — Encounter: Payer: Self-pay | Admitting: Cardiology

## 2013-03-15 ENCOUNTER — Telehealth: Payer: Self-pay | Admitting: Internal Medicine

## 2013-03-15 ENCOUNTER — Encounter: Payer: Self-pay | Admitting: Internal Medicine

## 2013-03-15 ENCOUNTER — Ambulatory Visit (INDEPENDENT_AMBULATORY_CARE_PROVIDER_SITE_OTHER): Payer: 59 | Admitting: Cardiology

## 2013-03-15 VITALS — BP 134/92 | HR 78 | Ht 68.0 in | Wt 165.4 lb

## 2013-03-15 DIAGNOSIS — Z950 Presence of cardiac pacemaker: Secondary | ICD-10-CM

## 2013-03-15 DIAGNOSIS — I441 Atrioventricular block, second degree: Secondary | ICD-10-CM

## 2013-03-15 NOTE — Telephone Encounter (Signed)
Left message for patient leting her know no lifting over 15 pounds for 6 weeks post implant and we will see her Tues with Dr Ladona Ridgel to see if she is feeling any better

## 2013-03-15 NOTE — Telephone Encounter (Signed)
Pt wants to know what her level of physical activity should be, what she can and can't do

## 2013-03-16 NOTE — Progress Notes (Signed)
ELECTROPHYSIOLOGY OFFICE NOTE  Patient ID: Kari [name] MRN: [id number], DOB/AGE: [birth year]   Date of Visit: 03/15/2013  Primary Physician: Levon Hedger, MD  Primary Cardiologist: Lewayne Bunting, MD Reason for Visit: Hospital follow-up  History of Present Illness  Kari [name] is a 62 y.o. female with 2:1 AV block who underwent PPM implantation 02/17/2013. She had been feeling well for 3 weeks post implant. However, last Friday she presented to [facility] ED on 03/11/2013 with SOB, flushing and near syncope. Her symptoms correlated to V pacing and would immediately resolve once she was conducting 1:1. She was evaluated by Dr. Graciela Husbands and her PPM mode was reprogrammed to VVI at 50 bpm. She was discharged from the ED. The following day she experienced recurrent symptoms and presented again to [facility] ED. She was evaluated in the ED by Dr. Ladona Ridgel and her pacemaker was reprogrammed to DDD with lower rate of 50 bpm. Dr. Ladona Ridgel recommended she take Cardizem. Please see Dr. Lubertha Basque full consult note.   She presents today for hospital follow-up. She states she did not tolerate Cardizem so she stopped taking it. She reports "extreme fatigue" on Cardizem. She continues to have intermittent flushing, SOB and lightheadedness. She denies chest pain. She denies palpitations or frank syncope. She denies LE swelling, orthopnea, PND or recent weight gain. Dr. Ladona Ridgel recommended earlier follow-up for her.   Past Medical History Past Medical History  Diagnosis Date  . Herpes zoster conjunctivitis   . Menopause   . Diverticulosis   . Pneumonia 2009  . GERD (gastroesophageal reflux disease)     "related to NSAID RX only" (02/17/2013)  . Arthritis     "hands" (02/17/2013)  . Seasonal allergies     "spring & fall; not q year" (02/17/2013)  . Mobitz type 2 second degree AV block     2:1/notes 02/16/2013    Past Surgical History Past Surgical History  Procedure Laterality Date  . Vaginal hysterectomy  ~  1994  . Arthrogram knee Right 1975  . Insert / replace / remove pacemaker  02/17/2013    [facility] Scientific Advantio dual-chamber pacemaker, model [id number],  . Tonsillectomy  1961  . Cholecystectomy  1990's  . Knee arthroscopy Right 1982, 1983, 1995, 1996  . Knee arthroscopy w/ acl reconstruction Left 1984  . 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 Trigger finger release Right ~ 2012    "thumb" (02/17/2013)    Allergies/Intolerances Allergies  Allergen Reactions  . Ceftin Hives  . Levofloxacin Other (See Comments)    "spacey"  . Vancomycin Hives   Current Home Medications Current Outpatient Prescriptions  Medication Sig Dispense Refill  . aspirin EC 81 MG tablet Take 81 mg by mouth daily.      Marland Kitchen esomeprazole (NEXIUM) 40 MG capsule Take 40 mg by mouth daily before breakfast.      . HYDROcodone-acetaminophen (NORCO/VICODIN) 5-325 MG per tablet Take 1 tablet by mouth 3 (three) times daily as needed for pain.       . Multiple Vitamin (MULTIVITAMIN WITH MINERALS) TABS Take 1 tablet by mouth daily.      . Omega-3 Fatty Acids (FISH OIL) 600 MG CAPS Take 4 capsules by mouth daily.      . Probiotic Product (PROBIOTIC FORMULA PO) Take 2 capsules by mouth every morning.       . valACYclovir (VALTREX) 500 MG tablet Take 500 mg by mouth daily.       No current  facility-administered medications for this visit.   Social History Social History  . Marital Status: Married   Social History Main Topics  . Smoking status: Never Smoker   . Smokeless tobacco: Never Used  . Alcohol Use: Yes     Comment: 02/17/2013 '4oz wine q couple weeks"  . Drug Use: No    Review of Systems General: No chills, fever, night sweats or weight changes Cardiovascular: No chest pain, dyspnea on exertion, edema, orthopnea, palpitations, paroxysmal nocturnal dyspnea Dermatological: No rash, lesions or masses Respiratory: No cough, dyspnea Urologic: No hematuria,  dysuria Abdominal: No nausea, vomiting, diarrhea, bright red blood per rectum, melena, or hematemesis Neurologic: No visual changes, weakness, changes in mental status All other systems reviewed and are otherwise negative except as noted above.  Physical Exam Vitals: Blood pressure 134/92, pulse 78, height 5\' 8"  (1.727 m), weight 165 lb 6.4 oz (75.025 kg), SpO2 98.00%.  General: Well developed, well appearing 62 y.o. female in no acute distress. HEENT: Normocephalic, atraumatic. EOMs intact. Sclera nonicteric. Oropharynx clear.  Neck: Supple. No JVD. Lungs: Respirations regular and unlabored, CTA bilaterally. No wheezes, rales or rhonchi. Heart: RRR. S1, S2 present. No murmurs, rub, S3 or S4. Abdomen: Soft, non-distended.  Extremities: No clubbing, cyanosis or edema. PT/Radials 2+ and equal bilaterally. Psych: Normal affect. Neuro: Alert and oriented X 3. Moves all extremities spontaneously.   Diagnostics Device interrogation - quick look today - no arrhythmias; V paced 58%; no programming changes made  Assessment and Plan 1. Symptomatic from PPM programming  2. Intermittent 2:1 AV block  3. Intact VA conduction  According to Dr. Lubertha Basque consult note from Saturday, he recommended earlier follow-up with him. This visit was already in place for her from her prior hospitalization. She states she did not tolerate Cardizem and continues to have symptoms. I will schedule follow-up with Dr. Ladona Ridgel next week.   Signed, Rick Duff, PA-C 03/16/2013, 9:22 AM

## 2013-03-17 ENCOUNTER — Encounter: Payer: 59 | Admitting: Cardiology

## 2013-03-19 ENCOUNTER — Other Ambulatory Visit: Payer: Self-pay | Admitting: *Deleted

## 2013-03-19 DIAGNOSIS — Z780 Asymptomatic menopausal state: Secondary | ICD-10-CM

## 2013-03-21 LAB — LIPID PANEL
Cholesterol: 188 mg/dL (ref 0–200)
HDL: 49 mg/dL (ref >39)
Triglycerides: 105 mg/dL (ref <150)
VLDL: 21 mg/dL (ref 0–40)

## 2013-03-22 ENCOUNTER — Encounter: Payer: Self-pay | Admitting: Internal Medicine

## 2013-03-22 ENCOUNTER — Ambulatory Visit (INDEPENDENT_AMBULATORY_CARE_PROVIDER_SITE_OTHER): Payer: 59 | Admitting: Internal Medicine

## 2013-03-22 VITALS — BP 143/79 | HR 68 | Ht 68.0 in | Wt 165.2 lb

## 2013-03-22 DIAGNOSIS — Z950 Presence of cardiac pacemaker: Secondary | ICD-10-CM

## 2013-03-22 DIAGNOSIS — R002 Palpitations: Secondary | ICD-10-CM

## 2013-03-22 NOTE — Patient Instructions (Addendum)
Your physician recommends that you schedule a follow-up appointment in: 2 months with Dr. Taylor  

## 2013-03-23 ENCOUNTER — Ambulatory Visit: Payer: 59 | Admitting: Internal Medicine

## 2013-03-24 LAB — PACEMAKER DEVICE OBSERVATION
AL IMPEDENCE PM: 461 Ohm
ATRIAL PACING PM: 5
DEVICE MODEL PM: 112476
VENTRICULAR PACING PM: 73

## 2013-03-25 ENCOUNTER — Encounter: Payer: 59 | Admitting: Internal Medicine

## 2013-03-25 ENCOUNTER — Encounter: Payer: Self-pay | Admitting: Internal Medicine

## 2013-03-25 NOTE — Progress Notes (Signed)
HPI Kari [name] returns today for followup. She is a very pleasant 62 year old woman with intermittent high-grade heart block, worse with exercise, status post permanent pacemaker insertion. She has had difficulty with growing accustomed to her device. At times, her cardiac awareness is increased and she can feel her ventricular pacing. No syncope. She has minimal chest discomfort, not related to exertion or position. She denies fevers or chills. Interrogation of her device has demonstrated question of retrograde VA conduction, versus far field atrial oversensing. Allergies  Allergen Reactions  . Ceftin Hives  . Levofloxacin Other (See Comments)    "spacey"  . Vancomycin Hives     Current Outpatient Prescriptions  Medication Sig Dispense Refill  . aspirin EC 81 MG tablet Take 81 mg by mouth daily.      Marland Kitchen esomeprazole (NEXIUM) 40 MG capsule Take 40 mg by mouth daily before breakfast.      . HYDROcodone-acetaminophen (NORCO/VICODIN) 5-325 MG per tablet Take 1 tablet by mouth 3 (three) times daily as needed for pain.       . Multiple Vitamin (MULTIVITAMIN WITH MINERALS) TABS Take 1 tablet by mouth daily.      . Omega-3 Fatty Acids (FISH OIL) 600 MG CAPS Take 4 capsules by mouth daily.      . Probiotic Product (PROBIOTIC FORMULA PO) Take 2 capsules by mouth every morning.       . valACYclovir (VALTREX) 500 MG tablet Take 500 mg by mouth daily.       No current facility-administered medications for this visit.     Past Medical History  Diagnosis Date  . Herpes zoster conjunctivitis   . Menopause   . Diverticulosis   . Pneumonia 2009  . GERD (gastroesophageal reflux disease)     "related to NSAID RX only" (02/17/2013)  . Arthritis     "hands" (02/17/2013)  . Seasonal allergies     "spring & fall; not q year" (02/17/2013)  . Mobitz type 2 second degree AV block     2:1/notes 02/16/2013    ROS:   All systems reviewed and negative except as noted in the HPI.   Past Surgical History   Procedure Laterality Date  . Vaginal hysterectomy  ~ 1994  . Arthrogram knee Right 1975  . Insert / replace / remove pacemaker  02/17/2013    [facility] Scientific Advantio dual-chamber pacemaker, model [id number],  . Tonsillectomy  1961  . Cholecystectomy  1990's  . Knee arthroscopy Right 1982, 1983, 1995, 1996  . Knee arthroscopy w/ acl reconstruction Left 1984  . 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 Trigger finger release Right ~ 2012    "thumb" (02/17/2013)     Family History  Problem Relation Age of Onset  . Hypertension Mother   . Hyperlipidemia Mother   . Macular degeneration Mother   . COPD Mother   . Lung cancer Father   . Cancer Father     Lung  . COPD Father   . Diabetes Brother   . Breast cancer Paternal Aunt      History   Social History  . Marital Status: Married    Spouse Name: N/A    Number of Children: N/A  . Years of Education: N/A   Occupational History  . Not on file.   Social History Main Topics  . Smoking status: Never Smoker   . Smokeless tobacco: Never Used  . Alcohol Use: Yes     Comment: 02/17/2013 '  4oz wine q couple weeks"  . Drug Use: No  . Sexually Active: Not on file     Comment: 02/17/2013 "I have a same sex partner"   Other Topics Concern  . Not on file   Social History Narrative  . No narrative on file     BP 143/79  Pulse 68  Ht 5\' 8"  (1.727 m)  Wt 165 lb 4 oz (74.957 kg)  BMI 25.13 kg/m2  Physical Exam:  Well appearing middle-aged woman, NAD HEENT: Unremarkable Neck:  7 cm JVD, no thyromegally Back:  No CVA tenderness Lungs:  Clear with no wheezes, rales, or rhonchi. Well-healed pacemaker incision. HEART:  Regular rate rhythm, no murmurs, no rubs, no clicks Abd:  soft, positive bowel sounds, no organomegally, no rebound, no guarding Ext:  2 plus pulses, no edema, no cyanosis, no clubbing Skin:  No rashes no nodules Neuro:  CN II through XII intact, motor  grossly intact   DEVICE  Normal device function.  See PaceArt for details.   Assess/Plan:

## 2013-03-25 NOTE — Assessment & Plan Note (Signed)
Her pacemaker appears to be working normally. We'll plan to recheck in several months. Her far field oversensing appears to be clinically irrelevant at this time.

## 2013-03-25 NOTE — Assessment & Plan Note (Signed)
Her symptoms have improved. I tried to reassure the patient. She was intolerant of calcium channel blockers.

## 2013-03-26 ENCOUNTER — Ambulatory Visit (INDEPENDENT_AMBULATORY_CARE_PROVIDER_SITE_OTHER): Payer: 59 | Admitting: Physician Assistant

## 2013-03-26 VITALS — BP 152/82 | HR 80 | Temp 97.5°F | Resp 16 | Ht 68.0 in

## 2013-03-26 DIAGNOSIS — H6980 Other specified disorders of Eustachian tube, unspecified ear: Secondary | ICD-10-CM

## 2013-03-26 DIAGNOSIS — H6983 Other specified disorders of Eustachian tube, bilateral: Secondary | ICD-10-CM

## 2013-03-26 DIAGNOSIS — J329 Chronic sinusitis, unspecified: Secondary | ICD-10-CM

## 2013-03-26 MED ORDER — IPRATROPIUM BROMIDE 0.03 % NA SOLN: 2.0000 | Freq: Two times a day (BID) | NASAL | Status: DC

## 2013-03-26 MED ORDER — AMOXICILLIN 875 MG PO TABS: 1750.0000 mg | ORAL_TABLET | Freq: Two times a day (BID) | ORAL | Status: DC

## 2013-03-26 NOTE — Progress Notes (Signed)
  Subjective:    Patient ID: Kari [name], female    DOB: 07-16-1951, 62 y.o.   MRN: [id number]  HPI This 62 y.o. female presents for evaluation of  Chief Complaint  Patient presents with  . Sinusitis    Ear pain; Ear press/popping; sinus press;HA; dizzy x 5 dys    She has a history of allergies, but is not currently using Flonase.  Her son recently returned from camp with a cold, and has lots of nasal congestion and drainage.  She describes feeling like she's in a fog, reduced hearing, buzzing sound in both ears.  Increased nasal and sinus congestion/drainage.  No fever, chills. No sore throat.  No cough.  No GI/GU symptoms.  Recent pacemaker placement due to Mobitz type II AV block. Past medical history, surgical history, family history, social history and problem list reviewed.   Review of Systems As above.    Objective:   Physical Exam Blood pressure 152/82, pulse 80, temperature 97.5 F (36.4 C), temperature source Oral, resp. rate 16, height 5\' 8"  (1.727 m), weight 0 lb (0 kg), SpO2 99.00%. Body mass index is 0.00 kg/(m^2). Well-developed, well nourished WF who is awake, alert and oriented, in NAD. HEENT: [place]/AT, PERRL, EOMI.  Sclera and conjunctiva are clear.  Initially, cerumen impacted in the RIGHT EAC prevented visualization of the TM.  Colace and irrigation resolved it. EAC are patent, TMs are normal in appearance. Nasal mucosa is pink and moist. OP is clear. Neck: supple, non-tender, no lymphadenopathy, thyromegaly. Heart: RRR, no murmur Lungs: normal effort, CTA Extremities: no cyanosis, clubbing or edema. Skin: warm and dry without rash. Psychologic: good mood and appropriate affect, normal speech and behavior.        Assessment & Plan:  Sinusitis - Plan: amoxicillin (AMOXIL) 875 MG tablet  ETD (eustachian tube dysfunction), bilateral - Plan: ipratropium (ATROVENT) 0.03 % nasal spray  Suspect viral illness and secondary early sinusitis causing ETD.   Supportive care.  Anticipatory guidance. Re-evaluate if symptoms worsen/persist.  Fernande Bras, PA-C Physician Assistant-Certified [facility]

## 2013-03-26 NOTE — Patient Instructions (Signed)
Restart your Flonase. Use the Atrovent nasal spray, 2 sprays in each nostril twice daily. Rest. Stay well hydrated. OTC Mucinex will help thin the mucous, making it easier for it to drain from your sinuses.

## 2013-03-29 ENCOUNTER — Encounter: Payer: Self-pay | Admitting: Internal Medicine

## 2013-03-29 ENCOUNTER — Ambulatory Visit (INDEPENDENT_AMBULATORY_CARE_PROVIDER_SITE_OTHER): Payer: 59 | Admitting: Internal Medicine

## 2013-03-29 VITALS — BP 123/79 | HR 68 | Temp 97.2°F | Resp 16 | Ht 68.0 in | Wt 167.0 lb

## 2013-03-29 DIAGNOSIS — J329 Chronic sinusitis, unspecified: Secondary | ICD-10-CM

## 2013-03-29 DIAGNOSIS — Z0000 Encounter for general adult medical examination without abnormal findings: Secondary | ICD-10-CM

## 2013-03-29 DIAGNOSIS — I441 Atrioventricular block, second degree: Secondary | ICD-10-CM

## 2013-03-29 MED ORDER — CEFTRIAXONE SODIUM 500 MG IJ SOLR
500.0000 mg | Freq: Once | INTRAMUSCULAR | Status: AC
  Administered 2013-03-29: 500 mg via INTRAMUSCULAR

## 2013-03-29 NOTE — Progress Notes (Signed)
Subjective:    Patient ID: Kari [name], female    DOB: April 28, 1951, 62 y.o.   MRN: [id number]  HPI       Is  Here for acute visit.  She has had pacemaker insertion since I have last seen here for high grade A:V block and is adjusting to that.  She reports she has a severe sinus infection after exposure to her son that came home from camp Lots of sinus pressure with Ringing in both ears at times.  She had some dizziness but none in the last two days.   No fever no cough no chest pain  She is also wondering if she is B13 deficient as there is a fFH in her mother.  She is not on a vegan diet Allergies  Allergen Reactions  . Ceftin Hives    Tolerates amoxicillin  . Levofloxacin Other (See Comments)    "spacey," tolerates Cipro  . Vancomycin Hives   Past Medical History  Diagnosis Date  . Herpes zoster conjunctivitis   . Menopause   . Diverticulosis   . Pneumonia 2009  . GERD (gastroesophageal reflux disease)     "related to NSAID RX only" (02/17/2013)  . Arthritis     "hands" (02/17/2013)  . Seasonal allergies     "spring & fall; not q year" (02/17/2013)  . Mobitz type 2 second degree AV block     2:1/notes 02/16/2013   Past Surgical History  Procedure Laterality Date  . Vaginal hysterectomy  ~ 1994  . Arthrogram knee Right 1975  . Insert / replace / remove pacemaker  02/17/2013    [facility] Scientific Advantio dual-chamber pacemaker, model [id number],  . Tonsillectomy  1961  . Cholecystectomy  1990's  . Knee arthroscopy Right 1982, 1983, 1995, 1996  . Knee arthroscopy w/ acl reconstruction Left 1984  . 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 Trigger finger release Right ~ 2012    "thumb" (02/17/2013)   History   Social History  . Marital Status: Married    Spouse Name: N/A    Number of Children: N/A  . Years of Education: N/A   Occupational History  . RN     [employer]   Social History Main Topics   . Smoking status: Never Smoker   . Smokeless tobacco: Never Used  . Alcohol Use: Yes     Comment: 02/17/2013 '4oz wine q couple weeks"  . Drug Use: No  . Sexually Active: Not on file     Comment: 02/17/2013 "I have a same sex partner"   Other Topics Concern  . Not on file   Social History Narrative  . No narrative on file   Family History  Problem Relation Age of Onset  . Hypertension Mother   . Hyperlipidemia Mother   . Macular degeneration Mother   . COPD Mother   . Lung cancer Father   . Cancer Father     Lung  . COPD Father   . Diabetes Brother   . Breast cancer Paternal Aunt    Patient Active Problem List   Diagnosis Date Noted  . Near syncope 03/11/2013  . Bradycardia 02/21/2013  . S/P cardiac pacemaker procedure 02/20/2013  . Chest tightness 02/19/2013  . Palpitations 02/19/2013  . 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  . Diverticulitis 03/03/2012  . Chronic low back pain 08/28/2011  . GERD (gastroesophageal reflux disease)   . Herpes zoster conjunctivitis   .  Menopause   . Arthritis   . Diverticulosis   . Allergy    Current Outpatient Prescriptions on File Prior to Visit  Medication Sig Dispense Refill  . amoxicillin (AMOXIL) 875 MG tablet Take 2 tablets (1,750 mg total) by mouth 2 (two) times daily.  20 tablet  0  . aspirin EC 81 MG tablet Take 81 mg by mouth daily.      Marland Kitchen esomeprazole (NEXIUM) 40 MG capsule Take 40 mg by mouth daily before breakfast.      . HYDROcodone-acetaminophen (NORCO/VICODIN) 5-325 MG per tablet Take 1 tablet by mouth 3 (three) times daily as needed for pain.       Marland Kitchen ipratropium (ATROVENT) 0.03 % nasal spray Place 2 sprays into the nose 2 (two) times daily.  30 mL  0  . Multiple Vitamin (MULTIVITAMIN WITH MINERALS) TABS Take 1 tablet by mouth daily.      . Omega-3 Fatty Acids (FISH OIL) 600 MG CAPS Take 4 capsules by mouth daily.      . Probiotic Product (PROBIOTIC FORMULA PO) Take 2 capsules by mouth every morning.        . valACYclovir (VALTREX) 500 MG tablet Take 500 mg by mouth daily.       No current facility-administered medications on file prior to visit.       Review of Systems    see HPI Objective:   Physical Exam Physical Exam  Nursing note and vitals reviewed.  Constitutional: She is oriented to person, place, and time. She appears well-developed and well-nourished.  HENT:  Head: Normocephalic and atraumatic.  Sinuses  Pressure over both maxillary sinuses Cardiovascular: Normal rate and regular rhythm. Exam reveals no gallop and no friction rub.  No murmur heard.  Pulmonary/Chest: Breath sounds normal. She has no wheezes. She has no rales.  Neurological: She is alert and oriented to person, place, and time.  Skin: Skin is warm and dry.  Psychiatric: She has a normal mood and affect. Her behavior is normal.             Assessment & Plan:  Sinusitis/tinnitus  Will give Rocephin  500 mg and advised to get Afrin  Nasal spray. If symptoms continue beyond two weeks  Will refer to ENT and consider sinus imaging.  She is to  Contact me if symptoms persist.  Continue Amoxicillin  A:V block  S/P pacemaker  Will check B12 today

## 2013-03-30 ENCOUNTER — Encounter: Payer: 59 | Admitting: Internal Medicine

## 2013-03-30 LAB — VITAMIN B12: Vitamin B-12: 1725 pg/mL — ABNORMAL HIGH (ref 211–911)

## 2013-03-31 ENCOUNTER — Other Ambulatory Visit: Payer: Self-pay | Admitting: Internal Medicine

## 2013-03-31 ENCOUNTER — Telehealth: Payer: Self-pay | Admitting: *Deleted

## 2013-03-31 ENCOUNTER — Encounter: Payer: Self-pay | Admitting: *Deleted

## 2013-03-31 DIAGNOSIS — R51 Headache: Secondary | ICD-10-CM

## 2013-03-31 DIAGNOSIS — J329 Chronic sinusitis, unspecified: Secondary | ICD-10-CM

## 2013-03-31 NOTE — Telephone Encounter (Signed)
Message copied by Arne Cleveland on Thu Mar 31, 2013  8:57 AM ------      Message from: Raechel Chute D      Created: Wed Mar 30, 2013  7:42 AM       [facility]  Call [place] and let her know that her vitamin B12 is better than Mine!!            Ok to mail result to her ------

## 2013-03-31 NOTE — Telephone Encounter (Signed)
Called pt to adv labs WNL - Left message for her to call if she had any questions and the labs will be mailed out to her.

## 2013-03-31 NOTE — Progress Notes (Signed)
Pt called triage nurse   and is still having headaches  Will get imaging

## 2013-03-31 NOTE — Telephone Encounter (Signed)
Rcvd message from C-A-N stating pt called in to adv headaches are still very bad - I adv pt that we will order a CT Head/Sinus - Pt would like CT scheduled here at [facility] tonight if possible because she will be on shift from 7P-7A

## 2013-04-01 ENCOUNTER — Ambulatory Visit (HOSPITAL_BASED_OUTPATIENT_CLINIC_OR_DEPARTMENT_OTHER)
Admission: RE | Admit: 2013-04-01 | Discharge: 2013-04-01 | Disposition: A | Discharge status: Home / Self Care | Payer: 59 | Source: Ambulatory Visit | Attending: Internal Medicine | Admitting: Internal Medicine

## 2013-04-01 DIAGNOSIS — J329 Chronic sinusitis, unspecified: Secondary | ICD-10-CM

## 2013-04-01 DIAGNOSIS — R42 Dizziness and giddiness: Secondary | ICD-10-CM | POA: Insufficient documentation

## 2013-04-01 DIAGNOSIS — H9319 Tinnitus, unspecified ear: Secondary | ICD-10-CM | POA: Insufficient documentation

## 2013-04-01 DIAGNOSIS — R51 Headache: Secondary | ICD-10-CM

## 2013-04-01 NOTE — Telephone Encounter (Signed)
Pt had CT completed.

## 2013-04-04 ENCOUNTER — Telehealth: Payer: Self-pay | Admitting: Internal Medicine

## 2013-04-04 ENCOUNTER — Telehealth: Payer: Self-pay | Admitting: *Deleted

## 2013-04-04 ENCOUNTER — Encounter: Payer: Self-pay | Admitting: Internal Medicine

## 2013-04-04 DIAGNOSIS — H9313 Tinnitus, bilateral: Secondary | ICD-10-CM

## 2013-04-04 NOTE — Telephone Encounter (Signed)
Left message on voicemail to call office regarding results of CT scan

## 2013-04-04 NOTE — Telephone Encounter (Signed)
Message copied by Mathews Robinsons on Mon Apr 04, 2013  2:30 PM ------      Message from: Raechel Chute D      Created: Sat Apr 02, 2013  8:56 PM       Karen Kitchens            Call [place] and let her now that her head CT and sinus CT are both clear.  If the ringing in her ears persist,  Will refer to ENT.            Let me know her response and if she would like to speak to an ENT  I would set her up with Dr. Pollyann Kennedy if she is not improved ------

## 2013-04-04 NOTE — Telephone Encounter (Signed)
Notified pt of her CT results pt would like to see the ENT

## 2013-04-12 ENCOUNTER — Ambulatory Visit: Payer: 59 | Admitting: Internal Medicine

## 2013-04-12 DIAGNOSIS — Z09 Encounter for follow-up examination after completed treatment for conditions other than malignant neoplasm: Secondary | ICD-10-CM

## 2013-04-12 NOTE — Telephone Encounter (Signed)
error 

## 2013-04-22 ENCOUNTER — Encounter: Payer: Self-pay | Admitting: Internal Medicine

## 2013-04-22 ENCOUNTER — Telehealth: Payer: Self-pay | Admitting: Internal Medicine

## 2013-04-22 ENCOUNTER — Ambulatory Visit (INDEPENDENT_AMBULATORY_CARE_PROVIDER_SITE_OTHER): Payer: 59 | Admitting: Internal Medicine

## 2013-04-22 VITALS — BP 147/92

## 2013-04-22 DIAGNOSIS — R0789 Other chest pain: Secondary | ICD-10-CM

## 2013-04-22 DIAGNOSIS — R079 Chest pain, unspecified: Secondary | ICD-10-CM

## 2013-04-22 DIAGNOSIS — Z950 Presence of cardiac pacemaker: Secondary | ICD-10-CM

## 2013-04-22 LAB — PACEMAKER DEVICE OBSERVATION
AL AMPLITUDE: 5 mv
BRDY-0002RA: 50 {beats}/min
DEVICE MODEL PM: 112476
VENTRICULAR PACING PM: 46

## 2013-04-22 NOTE — Patient Instructions (Addendum)
Your physician has requested that you have a lexiscan myoview. For further information please visit [email or web address]. Please follow instruction sheet, as given.   

## 2013-04-22 NOTE — Assessment & Plan Note (Signed)
Her pacemaker has been interrogated and is working normally. She has not had any sustained atrial arrhythmias, although she does have PACs.

## 2013-04-22 NOTE — Telephone Encounter (Signed)
New Problem  Pt started feeling terrible// yesterday afternoon that has continued.// Blocking down alot.  Wants to know what she should do

## 2013-04-22 NOTE — Telephone Encounter (Signed)
Will bring patient in to have her device checked and see Dr Ladona Ridgel

## 2013-04-22 NOTE — Assessment & Plan Note (Signed)
I've rrecommended that she undergo perfusion stress testing. The patient can exercise but with her pacemaker and ventricular pacing secondary to heart block, I'm concerned that an exercise perfusion study will result in a false positive. Plan elective scan Myoview to further assess her chest tightness.

## 2013-04-22 NOTE — Progress Notes (Signed)
HPI Kari [name] returns today for followup. She has an unscheduled visit because of feeling palpitations, chest pressure, and weakness. Her main complaint is that she "does not feel right". She notes she is worked a long 3 day shift working almost 13 hours each day. The patient states that she has chest tightness, which occurs at work. She has not exercised. It does occur with activity. There is no radiation to the arm neck or jaw. She has not had syncope. Overall she states that she feels weak. Allergies  Allergen Reactions  . Ceftin Hives    Tolerates amoxicillin  . Levofloxacin Other (See Comments)    "spacey," tolerates Cipro  . Vancomycin Hives     Current Outpatient Prescriptions  Medication Sig Dispense Refill  . amoxicillin (AMOXIL) 875 MG tablet Take 2 tablets (1,750 mg total) by mouth 2 (two) times daily.  20 tablet  0  . aspirin EC 81 MG tablet Take 81 mg by mouth daily.      Marland Kitchen esomeprazole (NEXIUM) 40 MG capsule Take 40 mg by mouth daily before breakfast.      . HYDROcodone-acetaminophen (NORCO/VICODIN) 5-325 MG per tablet Take 1 tablet by mouth 3 (three) times daily as needed for pain.       Marland Kitchen ipratropium (ATROVENT) 0.03 % nasal spray Place 2 sprays into the nose 2 (two) times daily.  30 mL  0  . Multiple Vitamin (MULTIVITAMIN WITH MINERALS) TABS Take 1 tablet by mouth daily.      . Omega-3 Fatty Acids (FISH OIL) 600 MG CAPS Take 4 capsules by mouth daily.      . Probiotic Product (PROBIOTIC FORMULA PO) Take 2 capsules by mouth every morning.       . valACYclovir (VALTREX) 500 MG tablet Take 500 mg by mouth daily.       No current facility-administered medications for this visit.     Past Medical History  Diagnosis Date  . Herpes zoster conjunctivitis   . Menopause   . Diverticulosis   . Pneumonia 2009  . GERD (gastroesophageal reflux disease)     "related to NSAID RX only" (02/17/2013)  . Arthritis     "hands" (02/17/2013)  . Seasonal allergies     "spring & fall;  not q year" (02/17/2013)  . Mobitz type 2 second degree AV block     2:1/notes 02/16/2013    ROS:   All systems reviewed and negative except as noted in the HPI.   Past Surgical History  Procedure Laterality Date  . Vaginal hysterectomy  ~ 1994  . Arthrogram knee Right 1975  . Insert / replace / remove pacemaker  02/17/2013    [facility] Scientific Advantio dual-chamber pacemaker, model [id number],  . Tonsillectomy  1961  . Cholecystectomy  1990's  . Knee arthroscopy Right 1982, 1983, 1995, 1996  . Knee arthroscopy w/ acl reconstruction Left 1984  . 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 Trigger finger release Right ~ 2012    "thumb" (02/17/2013)     Family History  Problem Relation Age of Onset  . Hypertension Mother   . Hyperlipidemia Mother   . Macular degeneration Mother   . COPD Mother   . Lung cancer Father   . Cancer Father     Lung  . COPD Father   . Diabetes Brother   . Breast cancer Paternal Aunt      History   Social History  . Marital Status: Married  Spouse Name: N/A    Number of Children: N/A  . Years of Education: N/A   Occupational History  . RN     [employer]   Social History Main Topics  . Smoking status: Never Smoker   . Smokeless tobacco: Never Used  . Alcohol Use: Yes     Comment: 02/17/2013 '4oz wine q couple weeks"  . Drug Use: No  . Sexual Activity: Not on file     Comment: 02/17/2013 "I have a same sex partner"   Other Topics Concern  . Not on file   Social History Narrative  . No narrative on file     BP 147/92  Physical Exam:  Well appearing 62 year old woman, NAD HEENT: Unremarkable Neck:  6 cm JVD, no thyromegally Back:  No CVA tenderness Lungs:  Clear with no wheezes, rales, or rhonchi. HEART:  Regular rate rhythm, no murmurs, no rubs, no clicks Abd:  soft, positive bowel sounds, no organomegally, no rebound, no guarding Ext:  2 plus pulses, no  edema, no cyanosis, no clubbing Skin:  No rashes no nodules Neuro:  CN II through XII intact, motor grossly intact  DEVICE  Normal device function.  See PaceArt for details.   Assess/Plan:

## 2013-04-26 ENCOUNTER — Ambulatory Visit (HOSPITAL_COMMUNITY): Payer: 59 | Attending: Cardiovascular Disease | Admitting: Radiology

## 2013-04-26 VITALS — BP 157/81 | HR 61 | Ht 68.0 in | Wt 166.0 lb

## 2013-04-26 DIAGNOSIS — R0789 Other chest pain: Secondary | ICD-10-CM | POA: Insufficient documentation

## 2013-04-26 DIAGNOSIS — R079 Chest pain, unspecified: Secondary | ICD-10-CM

## 2013-04-26 DIAGNOSIS — R002 Palpitations: Secondary | ICD-10-CM | POA: Insufficient documentation

## 2013-04-26 DIAGNOSIS — I4510 Unspecified right bundle-branch block: Secondary | ICD-10-CM

## 2013-04-26 MED ORDER — TECHNETIUM TC 99M SESTAMIBI GENERIC - CARDIOLITE
10.0000 | Freq: Once | INTRAVENOUS | Status: AC | PRN
  Administered 2013-04-26: 10 via INTRAVENOUS

## 2013-04-26 MED ORDER — AMINOPHYLLINE 25 MG/ML IV SOLN
75.0000 mg | Freq: Two times a day (BID) | INTRAVENOUS | Status: AC | PRN
  Administered 2013-04-26 (×2): 75 mg via INTRAVENOUS

## 2013-04-26 MED ORDER — REGADENOSON 0.4 MG/5ML IV SOLN
0.4000 mg | Freq: Once | INTRAVENOUS | Status: AC
  Administered 2013-04-26: 0.4 mg via INTRAVENOUS

## 2013-04-26 MED ORDER — TECHNETIUM TC 99M SESTAMIBI GENERIC - CARDIOLITE
30.0000 | Freq: Once | INTRAVENOUS | Status: AC | PRN
  Administered 2013-04-26: 30 via INTRAVENOUS

## 2013-04-26 NOTE — Progress Notes (Signed)
[facility] SITE 3 NUCLEAR MED [address] [place], [place] [address] [phone]    Cardiology Nuclear Med Study  Kari [name] is a 62 y.o. female     MRN : [id number]     DOB: February 13, 1951  Procedure Date: 04/26/2013  Nuclear Med Background Indication for Stress Test:  Evaluation for Ischemia History:  6/14 Echo:EF=60%, mild MR; 6/14 PTVP Cardiac Risk Factors: No previously documented CAD  Symptoms:  Chest Pressure/Tightness with and without Exertion (last episode of chest discomfort was last Friday) and Palpitations   Nuclear Pre-Procedure Caffeine/Decaff Intake:  None > 12 hrs NPO After: 7:30am   Lungs:  Clear. O2 Sat: 98% on room air. IV 0.9% NS with Angio Cath:  20g  IV Site: R Antecubital x 1, tolerated well IV Started by:  Irean Hong, RN  Chest Size (in):  38 Cup Size: B  Height: 5\' 8"  (1.727 m)  Weight:  166 lb (75.297 kg)  BMI:  Body mass index is 25.25 kg/(m^2). Tech Comments:  n/a    Nuclear Med Study 1 or 2 day study: 1 day  Stress Test Type:  Treadmill/Lexiscan  Reading MD: Kristeen Miss, MD  Order Authorizing Provider:  Lewayne Bunting, MD  Resting Radionuclide: Technetium 71m Sestamibi  Resting Radionuclide Dose: 11.0 mCi   Stress Radionuclide:  Technetium 16m Sestamibi  Stress Radionuclide Dose: 33.0 mCi           Stress Protocol Rest HR: 66 Stress HR: 121  Rest BP: 157/81 Stress BP: 167/83  Exercise Time (min): 2:00 METS: n/a   Predicted Max HR: 160 bpm % Max HR: 75.62 bpm Rate Pressure Product: 21308   Dose of Adenosine (mg):  n/a Dose of Lexiscan: 0.4 mg  Dose of Atropine (mg): n/a Dose of Dobutamine: n/a mcg/kg/min (at max HR)  Stress Test Technologist: Smiley Houseman, CMA-N  Nuclear Technologist:  Domenic Polite, CNMT     Rest Procedure:  Myocardial perfusion imaging was performed at rest 45 minutes following the intravenous administration of Technetium 54m Sestamibi.  Rest ECG: NSR with A sensing and V pacing.  Stress  Procedure:  The patient received IV Lexiscan 0.4 mg over 15-seconds.  Technetium 73m Sestamibi injected at 30-seconds.  She c/o chest pressure and headache with Lexiscan that was relieved with Aminophylline 75 mg.  Quantitative spect images were obtained after a 45 minute delay.  Stress ECG: No significant change from baseline ECG  QPS Raw Data Images:  There is interference from nuclear activity from structures below the diaphragm. This does not affect the ability to read the study. Stress Images:  There is a small area of mild attenuation of the mid/distal anterior septal wall with normal uptake in the other regions.        Rest Images:  There is a small area of mild attenuation of the mid/distal anterior septal wall with normal uptake in the other regions.   Subtraction (SDS):  No evidence of ischemia.  There is a small fixed defect that likely represents  attenuation Transient Ischemic Dilatation (Normal <1.22):  n/a Lung/Heart Ratio (Normal <0.45):  0.38  Quantitative Gated Spect Images QGS EDV:  80 ml QGS ESV:  28 ml  Impression Exercise Capacity:  Lexiscan with low level exercise. BP Response:  Normal blood pressure response. Clinical Symptoms:  No significant symptoms noted. ECG Impression:  No significant ST segment change suggestive of ischemia. Comparison with Prior Nuclear Study: No previous nuclear study performed  Overall Impression:  Low risk  stress nuclear study .  there is a small fixed defect of the distal anterior wall that may be due to breast artifact.    the distal anterior wall contracts normally. .  LV Ejection Fraction: 65%.  LV Wall Motion:  NL LV Function; NL Wall Motion.   Vesta Mixer, Montez Hageman., MD, [facility] 04/26/2013, 5:21 PM Office - [phone] Pager [phone]

## 2013-05-10 ENCOUNTER — Telehealth: Payer: Self-pay | Admitting: *Deleted

## 2013-05-10 NOTE — Telephone Encounter (Signed)
Left message on machine with results of stress test

## 2013-05-23 ENCOUNTER — Other Ambulatory Visit (HOSPITAL_BASED_OUTPATIENT_CLINIC_OR_DEPARTMENT_OTHER): Payer: Self-pay | Admitting: Obstetrics and Gynecology

## 2013-05-23 DIAGNOSIS — Z139 Encounter for screening, unspecified: Secondary | ICD-10-CM

## 2013-05-24 ENCOUNTER — Encounter: Payer: Self-pay | Admitting: Internal Medicine

## 2013-05-24 ENCOUNTER — Ambulatory Visit (INDEPENDENT_AMBULATORY_CARE_PROVIDER_SITE_OTHER): Payer: 59 | Admitting: Internal Medicine

## 2013-05-24 VITALS — BP 145/87 | HR 71 | Ht 68.0 in | Wt 170.0 lb

## 2013-05-24 DIAGNOSIS — R001 Bradycardia, unspecified: Secondary | ICD-10-CM

## 2013-05-24 DIAGNOSIS — Z950 Presence of cardiac pacemaker: Secondary | ICD-10-CM

## 2013-05-24 DIAGNOSIS — I441 Atrioventricular block, second degree: Secondary | ICD-10-CM

## 2013-05-24 DIAGNOSIS — I498 Other specified cardiac arrhythmias: Secondary | ICD-10-CM

## 2013-05-24 DIAGNOSIS — R002 Palpitations: Secondary | ICD-10-CM

## 2013-05-24 NOTE — Progress Notes (Signed)
HPI Mrs. Hudock returns today for followup. Overall she is improved.she is working regularly and exercising daily, walking up to 3 miles. She continues to have the sensation that her heart is beating irregularly at times. No syncope. No chest pain or shortness of breath with exertion. No peripheral edema. Allergies  Allergen Reactions  . Ceftin Hives    Tolerates amoxicillin  . Levofloxacin Other (See Comments)    "spacey," tolerates Cipro  . Vancomycin Hives     Current Outpatient Prescriptions  Medication Sig Dispense Refill  . amoxicillin (AMOXIL) 875 MG tablet Take 2 tablets (1,750 mg total) by mouth 2 (two) times daily.  20 tablet  0  . aspirin EC 81 MG tablet Take 81 mg by mouth daily.      Marland Kitchen esomeprazole (NEXIUM) 40 MG capsule Take 40 mg by mouth daily before breakfast.      . HYDROcodone-acetaminophen (NORCO/VICODIN) 5-325 MG per tablet Take 1 tablet by mouth 3 (three) times daily as needed for pain.       Marland Kitchen ipratropium (ATROVENT) 0.03 % nasal spray Place 2 sprays into the nose 2 (two) times daily.  30 mL  0  . Multiple Vitamin (MULTIVITAMIN WITH MINERALS) TABS Take 1 tablet by mouth daily.      . Omega-3 Fatty Acids (FISH OIL) 600 MG CAPS Take 4 capsules by mouth daily.      . Probiotic Product (PROBIOTIC FORMULA PO) Take 2 capsules by mouth every morning.       . valACYclovir (VALTREX) 500 MG tablet Take 500 mg by mouth daily.       No current facility-administered medications for this visit.     Past Medical History  Diagnosis Date  . Herpes zoster conjunctivitis   . Menopause   . Diverticulosis   . Pneumonia 2009  . GERD (gastroesophageal reflux disease)     "related to NSAID RX only" (02/17/2013)  . Arthritis     "hands" (02/17/2013)  . Seasonal allergies     "spring & fall; not q year" (02/17/2013)  . Mobitz type 2 second degree AV block     2:1/notes 02/16/2013    ROS:   All systems reviewed and negative except as noted in the HPI.   Past Surgical History   Procedure Laterality Date  . Vaginal hysterectomy  ~ 1994  . Arthrogram knee Right 1975  . Insert / replace / remove pacemaker  02/17/2013    [facility] Scientific Advantio dual-chamber pacemaker, model [id number],  . Tonsillectomy  1961  . Cholecystectomy  1990's  . Knee arthroscopy Right 1982, 1983, 1995, 1996  . Knee arthroscopy w/ acl reconstruction Left 1984  . 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 Trigger finger release Right ~ 2012    "thumb" (02/17/2013)     Family History  Problem Relation Age of Onset  . Hypertension Mother   . Hyperlipidemia Mother   . Macular degeneration Mother   . COPD Mother   . Lung cancer Father   . Cancer Father     Lung  . COPD Father   . Diabetes Brother   . Breast cancer Paternal Aunt      History   Social History  . Marital Status: Married    Spouse Name: N/A    Number of Children: N/A  . Years of Education: N/A   Occupational History  . RN     [employer]   Social History Main  Topics  . Smoking status: Never Smoker   . Smokeless tobacco: Never Used  . Alcohol Use: Yes     Comment: 02/17/2013 '4oz wine q couple weeks"  . Drug Use: No  . Sexual Activity: Not on file     Comment: 02/17/2013 "I have a same sex partner"   Other Topics Concern  . Not on file   Social History Narrative  . No narrative on file     Blood pressure 145/87, pulse 71, height 5 foot 8 inches, weight 170 pounds, body mass index 25.9  Physical Exam:  Well appearing 62 year old woman, NAD HEENT: Unremarkable Neck:  6 cm JVD, no thyromegally Back:  No CVA tenderness Lungs:  Clear with no wheezes, rales, or rhonchi. HEART:  Regular rate rhythm, no murmurs, no rubs, no clicks. Well-healed pacemaker incision Abd:  soft, positive bowel sounds, no organomegally, no rebound, no guarding Ext:  2 plus pulses, no edema, no cyanosis, no clubbing Skin:  No rashes no nodules Neuro:  CN II  through XII intact, motor grossly intact  DEVICE  Normal device function.  See PaceArt for details.   Assess/Plan:

## 2013-05-24 NOTE — Assessment & Plan Note (Signed)
He continues to see evidence of far field. We have reprogrammed her atrial sensitivity today going from 1 mV to 3 mV. This gives her an approximate 2:1 safety margin. In addition, we turned up mode switch off. It appeared that she was having symptoms of palpitations when she would mode switch and dyssynchronously ventricular pace.

## 2013-05-24 NOTE — Patient Instructions (Addendum)
Your physician wants you to follow-up in: 01/2014 with Dr Court Joy will receive a reminder letter in the mail two months in advance. If you don't receive a letter, please call our office to schedule the follow-up appointment.  Remote monitoring is used to monitor your Pacemaker or ICD from home. This monitoring reduces the number of office visits required to check your device to one time per year. It allows [place] to keep an eye on the functioning of your device to ensure it is working properly. You are scheduled for a device check from home on 09/01/2012. You may send your transmission at any time that day. If you have a wireless device, the transmission will be sent automatically. After your physician reviews your transmission, you will receive a postcard with your next transmission date.

## 2013-05-24 NOTE — Assessment & Plan Note (Signed)
These are still present, but are improved.

## 2013-06-14 ENCOUNTER — Ambulatory Visit (INDEPENDENT_AMBULATORY_CARE_PROVIDER_SITE_OTHER): Payer: 59 | Admitting: *Deleted

## 2013-06-14 DIAGNOSIS — Z23 Encounter for immunization: Secondary | ICD-10-CM

## 2013-06-17 ENCOUNTER — Ambulatory Visit (HOSPITAL_BASED_OUTPATIENT_CLINIC_OR_DEPARTMENT_OTHER)
Admission: RE | Admit: 2013-06-17 | Discharge: 2013-06-17 | Disposition: A | Discharge status: Home / Self Care | Payer: 59 | Source: Ambulatory Visit | Attending: Obstetrics and Gynecology | Admitting: Obstetrics and Gynecology

## 2013-06-17 DIAGNOSIS — Z139 Encounter for screening, unspecified: Secondary | ICD-10-CM

## 2013-06-17 DIAGNOSIS — Z1231 Encounter for screening mammogram for malignant neoplasm of breast: Secondary | ICD-10-CM | POA: Insufficient documentation

## 2013-06-21 ENCOUNTER — Other Ambulatory Visit: Payer: Self-pay | Admitting: Obstetrics and Gynecology

## 2013-06-21 DIAGNOSIS — R928 Other abnormal and inconclusive findings on diagnostic imaging of breast: Secondary | ICD-10-CM

## 2013-06-30 ENCOUNTER — Other Ambulatory Visit: Payer: Self-pay

## 2013-07-05 ENCOUNTER — Ambulatory Visit
Admission: RE | Admit: 2013-07-05 | Discharge: 2013-07-05 | Disposition: A | Discharge status: Home / Self Care | Payer: 59 | Source: Ambulatory Visit | Attending: Obstetrics and Gynecology | Admitting: Obstetrics and Gynecology

## 2013-07-05 DIAGNOSIS — R928 Other abnormal and inconclusive findings on diagnostic imaging of breast: Secondary | ICD-10-CM

## 2013-09-01 ENCOUNTER — Ambulatory Visit (INDEPENDENT_AMBULATORY_CARE_PROVIDER_SITE_OTHER): Payer: 59 | Admitting: *Deleted

## 2013-09-01 DIAGNOSIS — I498 Other specified cardiac arrhythmias: Secondary | ICD-10-CM

## 2013-09-01 DIAGNOSIS — R001 Bradycardia, unspecified: Secondary | ICD-10-CM

## 2013-09-01 DIAGNOSIS — I441 Atrioventricular block, second degree: Secondary | ICD-10-CM

## 2013-09-02 LAB — MDC_IDC_ENUM_SESS_TYPE_REMOTE
Lead Channel Impedance Value: 551 Ohm
Lead Channel Sensing Intrinsic Amplitude: 3.2 mV
Lead Channel Setting Pacing Amplitude: 3 V
Lead Channel Setting Pacing Pulse Width: 0.4 ms
Lead Channel Setting Sensing Sensitivity: 2.5 mV
MDC IDC MSMT LEADCHNL RV IMPEDANCE VALUE: 472 Ohm
MDC IDC PG SERIAL: 112476
MDC IDC SET LEADCHNL RA PACING AMPLITUDE: 2.5 V

## 2013-09-07 ENCOUNTER — Encounter: Payer: Self-pay | Admitting: *Deleted

## 2013-09-13 ENCOUNTER — Encounter: Payer: Self-pay | Admitting: Internal Medicine

## 2013-10-06 ENCOUNTER — Telehealth: Payer: Self-pay | Admitting: *Deleted

## 2013-10-06 NOTE — Telephone Encounter (Signed)
Pt believes her heart rate is down to 45--she knows her lower rate is 50bpm. Pt c/o of not being able to rest, is having skipped beats, and palpitations (says not uncommon for her to have palps).  She is concerned that her palpitations have been occuring for extended period of time beyond normality.   Pt also c/o treadmill exercise for 20, 30, or up to 7min that her heart rate gradually rises then sustains. When completing exercise her rate drops. Says heart is sometimes at 50 when getting off treadmill.   Pt sent extra transmission. All functions & diagnostics normal. No VT, SVT, Atrial arrhythmias, or NSVTs. Pt requesting I follow up w/ Claiborne Billings and/or Dr. Lovena Le for direct answer to feeling terrible.

## 2013-12-08 ENCOUNTER — Ambulatory Visit (INDEPENDENT_AMBULATORY_CARE_PROVIDER_SITE_OTHER): Payer: 59 | Admitting: *Deleted

## 2013-12-08 DIAGNOSIS — I441 Atrioventricular block, second degree: Secondary | ICD-10-CM

## 2013-12-08 DIAGNOSIS — R001 Bradycardia, unspecified: Secondary | ICD-10-CM

## 2013-12-08 DIAGNOSIS — I498 Other specified cardiac arrhythmias: Secondary | ICD-10-CM

## 2013-12-27 ENCOUNTER — Encounter: Payer: Self-pay | Admitting: Cardiology

## 2014-01-04 ENCOUNTER — Encounter: Payer: Self-pay | Admitting: Internal Medicine

## 2014-01-31 ENCOUNTER — Encounter: Payer: Self-pay | Admitting: Internal Medicine

## 2014-01-31 ENCOUNTER — Ambulatory Visit (INDEPENDENT_AMBULATORY_CARE_PROVIDER_SITE_OTHER): Payer: 59 | Admitting: Internal Medicine

## 2014-01-31 VITALS — BP 135/86 | HR 82 | Ht 68.0 in | Wt 155.0 lb

## 2014-01-31 DIAGNOSIS — R002 Palpitations: Secondary | ICD-10-CM

## 2014-01-31 DIAGNOSIS — Z950 Presence of cardiac pacemaker: Secondary | ICD-10-CM

## 2014-01-31 DIAGNOSIS — R001 Bradycardia, unspecified: Secondary | ICD-10-CM

## 2014-01-31 DIAGNOSIS — I498 Other specified cardiac arrhythmias: Secondary | ICD-10-CM

## 2014-01-31 LAB — MDC_IDC_ENUM_SESS_TYPE_INCLINIC
Battery Remaining Longevity: 144 mo
Brady Statistic RA Percent Paced: 1 %
Brady Statistic RV Percent Paced: 100 %
Implantable Pulse Generator Serial Number: 112476
Lead Channel Impedance Value: 479 Ohm
Lead Channel Pacing Threshold Amplitude: 1.1 V
Lead Channel Pacing Threshold Pulse Width: 0.4 ms
Lead Channel Pacing Threshold Pulse Width: 0.5 ms
Lead Channel Sensing Intrinsic Amplitude: 21.2 mV
Lead Channel Setting Pacing Amplitude: 2 V
Lead Channel Setting Pacing Amplitude: 2.4 V
Lead Channel Setting Pacing Pulse Width: 0.4 ms
MDC IDC MSMT LEADCHNL RA IMPEDANCE VALUE: 541 Ohm
MDC IDC MSMT LEADCHNL RA PACING THRESHOLD AMPLITUDE: 0.6 V
MDC IDC MSMT LEADCHNL RA SENSING INTR AMPL: 5.8 mV
MDC IDC SESS DTM: 20150609040000
MDC IDC SET LEADCHNL RV SENSING SENSITIVITY: 2.5 mV
Zone Setting Detection Interval: 375 ms

## 2014-01-31 NOTE — Patient Instructions (Addendum)
Your physician recommends that you continue on your current medications as directed. Please refer to the Current Medication list given to you today.   Remote monitoring is used to monitor your Pacemaker  from home. This monitoring reduces the number of office visits required to check your device to one time per year. It allows [place] to keep an eye on the functioning of your device to ensure it is working properly. You are scheduled for a device check from home on 05/04/14. You may send your transmission at any time that day. If you have a wireless device, the transmission will be sent automatically. After your physician reviews your transmission, you will receive a postcard with your next transmission date.  Your physician wants you to follow-up in: 1 year with Dr.Taylor You will receive a reminder letter in the mail two months in advance. If you don't receive a letter, please call our office to schedule the follow-up appointment.

## 2014-01-31 NOTE — Assessment & Plan Note (Signed)
Her [place] DDD PM is working normally. Will recheck in several months. 

## 2014-01-31 NOTE — Progress Notes (Signed)
HPI Kari [name] returns today for followup. She is a pleasant 63 yo woman wnith symptomatic CHB, s/p PPM insertion, who initially had trouble after her PPM was inserted with palpitations, far field oversensing, and PM wenckebach. Her device was reprogrammed. She has improved. She still has palpitations but is mostly better. No syncope. No chest pain or shortness of breath with exertion. No peripheral edema. Allergies  Allergen Reactions  . Ceftin Hives    Tolerates amoxicillin  . Levofloxacin Other (See Comments)    "spacey," tolerates Cipro  . Vancomycin Hives     Current Outpatient Prescriptions  Medication Sig Dispense Refill  . aspirin EC 81 MG tablet Take 81 mg by mouth daily.      . fluticasone (FLONASE) 50 MCG/ACT nasal spray Place into both nostrils daily.      Marland Kitchen HYDROcodone-acetaminophen (NORCO/VICODIN) 5-325 MG per tablet Take 1 tablet by mouth 3 (three) times daily as needed for pain.       . Multiple Vitamin (MULTIVITAMIN WITH MINERALS) TABS Take 1 tablet by mouth daily.      . Omega-3 Fatty Acids (FISH OIL) 600 MG CAPS Take 4 capsules by mouth daily.      . Probiotic Product (PROBIOTIC FORMULA PO) Take 2 capsules by mouth every morning.       . valACYclovir (VALTREX) 500 MG tablet Take 500 mg by mouth daily.       No current facility-administered medications for this visit.     Past Medical History  Diagnosis Date  . Herpes zoster conjunctivitis   . Menopause   . Diverticulosis   . Pneumonia 2009  . GERD (gastroesophageal reflux disease)     "related to NSAID RX only" (02/17/2013)  . Arthritis     "hands" (02/17/2013)  . Seasonal allergies     "spring & fall; not q year" (02/17/2013)  . Mobitz type 2 second degree AV block     2:1/notes 02/16/2013    ROS:   All systems reviewed and negative except as noted in the HPI.   Past Surgical History  Procedure Laterality Date  . Vaginal hysterectomy  ~ 1994  . Arthrogram knee Right 1975  . Insert / replace /  remove pacemaker  02/17/2013    [facility] Scientific Advantio dual-chamber pacemaker, model [id number],  . Tonsillectomy  1961  . Cholecystectomy  1990's  . Knee arthroscopy Right 1982, [place]  . Knee arthroscopy w/ acl reconstruction Left 1984  . 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 Trigger finger release Right ~ 2012    "thumb" (02/17/2013)     Family History  Problem Relation Age of Onset  . Hypertension Mother   . Hyperlipidemia Mother   . Macular degeneration Mother   . COPD Mother   . Lung cancer Father   . Cancer Father     Lung  . COPD Father   . Diabetes Brother   . Breast cancer Paternal Aunt      History   Social History  . Marital Status: Married    Spouse Name: N/A    Number of Children: N/A  . Years of Education: N/A   Occupational History  . RN     [employer]   Social History Main Topics  . Smoking status: Never Smoker   . Smokeless tobacco: Never Used  . Alcohol Use: Yes     Comment: 02/17/2013 '4oz wine q couple weeks"  . Drug  Use: No  . Sexual Activity: Not on file     Comment: 02/17/2013 "I have a same sex partner"   Other Topics Concern  . Not on file   Social History Narrative  . No narrative on file     Blood pressure 145/87, pulse 71, height 5 foot 8 inches, weight 170 pounds, body mass index 25.9  Physical Exam:  Well appearing 63 year old woman, NAD HEENT: Unremarkable Neck:  6 cm JVD, no thyromegally Back:  No CVA tenderness Lungs:  Clear with no wheezes, rales, or rhonchi. HEART:  Regular rate rhythm, no murmurs, no rubs, no clicks. Well-healed pacemaker incision Abd:  soft, positive bowel sounds, no organomegally, no rebound, no guarding Ext:  2 plus pulses, no edema, no cyanosis, no clubbing Skin:  No rashes no nodules Neuro:  CN II through XII intact, motor grossly intact  DEVICE  Normal device function.  See PaceArt for details.    Assess/Plan:

## 2014-01-31 NOTE — Assessment & Plan Note (Signed)
Her symptoms are improved but still present. Will follow. She clearly has both PAC's and PVC's which she feels. Will hopefully be able to avoid AA drugs.

## 2014-02-07 ENCOUNTER — Encounter: Payer: Self-pay | Admitting: Internal Medicine

## 2014-04-04 ENCOUNTER — Telehealth: Payer: Self-pay | Admitting: Internal Medicine

## 2014-04-04 NOTE — Telephone Encounter (Signed)
Pt has Never came to [place] up FMLA signed From 7.15.2014, this was scanned In and placed in [employer]   8.11.15/km

## 2014-04-18 ENCOUNTER — Other Ambulatory Visit: Payer: Self-pay | Admitting: Family Medicine

## 2014-05-04 ENCOUNTER — Ambulatory Visit (INDEPENDENT_AMBULATORY_CARE_PROVIDER_SITE_OTHER): Payer: 59 | Admitting: *Deleted

## 2014-05-04 DIAGNOSIS — I441 Atrioventricular block, second degree: Secondary | ICD-10-CM

## 2014-05-04 NOTE — Progress Notes (Signed)
Remote pacemaker transmission.   

## 2014-05-16 ENCOUNTER — Encounter: Payer: Self-pay | Admitting: Cardiology

## 2014-05-17 ENCOUNTER — Encounter: Payer: Self-pay | Admitting: Internal Medicine

## 2014-06-09 ENCOUNTER — Other Ambulatory Visit: Payer: Self-pay

## 2014-06-26 ENCOUNTER — Encounter: Payer: Self-pay | Admitting: Internal Medicine

## 2014-07-13 ENCOUNTER — Other Ambulatory Visit: Payer: Self-pay | Admitting: Obstetrics and Gynecology

## 2014-07-17 LAB — CYTOLOGY - PAP

## 2014-07-23 ENCOUNTER — Emergency Department (HOSPITAL_COMMUNITY)
Admission: EM | Admit: 2014-07-23 | Discharge: 2014-07-23 | Disposition: A | Discharge status: Home / Self Care | Payer: 59 | Source: Home / Self Care | Attending: Family Medicine | Admitting: Family Medicine

## 2014-07-23 ENCOUNTER — Encounter (HOSPITAL_COMMUNITY): Payer: Self-pay | Admitting: *Deleted

## 2014-07-23 DIAGNOSIS — N390 Urinary tract infection, site not specified: Secondary | ICD-10-CM

## 2014-07-23 LAB — POCT URINALYSIS DIP (DEVICE)
Bilirubin Urine: NEGATIVE
Glucose, UA: NEGATIVE mg/dL
Ketones, ur: NEGATIVE mg/dL
Nitrite: NEGATIVE
Protein, ur: 30 mg/dL — AB
Specific Gravity, Urine: 1.01 (ref 1.005–1.030)
UROBILINOGEN UA: 0.2 mg/dL (ref 0.0–1.0)
pH: 5 (ref 5.0–8.0)

## 2014-07-23 MED ORDER — CEPHALEXIN 500 MG PO CAPS: 500.0000 mg | ORAL_CAPSULE | Freq: Four times a day (QID) | ORAL | Status: DC

## 2014-07-23 MED ORDER — NITROFURANTOIN MONOHYD MACRO 100 MG PO CAPS: 100.0000 mg | ORAL_CAPSULE | Freq: Two times a day (BID) | ORAL | Status: DC

## 2014-07-23 NOTE — Discharge Instructions (Signed)

## 2014-07-23 NOTE — ED Provider Notes (Signed)
CSN: [id number]     Arrival date & time 07/23/14  1606 History   First MD Initiated Contact with Patient 07/23/14 1641     Chief Complaint  Patient presents with  . Urinary Tract Infection   (Consider location/radiation/quality/duration/timing/severity/associated sxs/prior Treatment) Patient is a 63 y.o. female presenting with urinary tract infection. The history is provided by the patient.  Urinary Tract Infection This is a new problem. Episode onset: 2 weeks ago. The problem occurs constantly. The problem has been gradually worsening. Associated symptoms include abdominal pain. Nothing aggravates the symptoms. Nothing relieves the symptoms. Treatments tried: bactrim. Improvement on treatment: transient improvement until 3 days ago when sx returned.    Past Medical History  Diagnosis Date  . Herpes zoster conjunctivitis   . Menopause   . Diverticulosis   . Pneumonia 2009  . GERD (gastroesophageal reflux disease)     "related to NSAID RX only" (02/17/2013)  . Arthritis     "hands" (02/17/2013)  . Seasonal allergies     "spring & fall; not q year" (02/17/2013)  . Mobitz type 2 second degree AV block     2:1/notes 02/16/2013   Past Surgical History  Procedure Laterality Date  . Vaginal hysterectomy  ~ 1994  . Arthrogram knee Right 1975  . Insert / replace / remove pacemaker  02/17/2013    [facility] Scientific Advantio dual-chamber pacemaker, model [id number],  . Tonsillectomy  1961  . Cholecystectomy  1990's  . Knee arthroscopy Right 1982, [place]  . Knee arthroscopy w/ acl reconstruction Left 1984  . 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 Trigger finger release Right ~ 2012    "thumb" (02/17/2013)   Family History  Problem Relation Age of Onset  . Hypertension Mother   . Hyperlipidemia Mother   . Macular degeneration Mother   . COPD Mother   . Lung cancer Father   . Cancer Father     Lung  . COPD Father   .  Diabetes Brother   . Breast cancer Paternal Aunt    History  Substance Use Topics  . Smoking status: Never Smoker   . Smokeless tobacco: Never Used  . Alcohol Use: Not on file   OB History    Gravida Para Term Preterm AB TAB SAB Ectopic Multiple Living   1 1             Review of Systems  Constitutional: Positive for chills. Negative for fever.  Gastrointestinal: Positive for abdominal pain. Negative for nausea and vomiting.  Genitourinary: Positive for dysuria. Negative for flank pain.    Allergies  Ceftin; Levofloxacin; and Vancomycin  Home Medications   Prior to Admission medications   Medication Sig Start Date End Date Taking? Authorizing Provider  aspirin EC 81 MG tablet Take 81 mg by mouth daily.   Yes Historical Provider, MD  fluticasone (FLONASE) 50 MCG/ACT nasal spray Place into both nostrils daily.   Yes Historical Provider, MD  HYDROcodone-acetaminophen (NORCO/VICODIN) 5-325 MG per tablet Take 1 tablet by mouth 3 (three) times daily as needed for pain.    Yes Historical Provider, MD  Multiple Vitamin (MULTIVITAMIN WITH MINERALS) TABS Take 1 tablet by mouth daily.   Yes Historical Provider, MD  Omega-3 Fatty Acids (FISH OIL) 600 MG CAPS Take 4 capsules by mouth daily.   Yes Historical Provider, MD  Probiotic Product (PROBIOTIC FORMULA PO) Take 2 capsules by mouth every morning.    Yes Historical  Provider, MD  valACYclovir (VALTREX) 1000 MG tablet Take 1,000 mg by mouth as needed.   Yes Historical Provider, MD  nitrofurantoin, macrocrystal-monohydrate, (MACROBID) 100 MG capsule Take 1 capsule (100 mg total) by mouth 2 (two) times daily. 07/23/14   Carvel Getting, NP   BP 139/84 mmHg  Pulse 73  Temp(Src) 98.3 F (36.8 C) (Oral)  Resp 14  SpO2 95% Physical Exam  Constitutional: She appears well-developed and well-nourished. No distress.  Cardiovascular: Normal rate and regular rhythm.   Pulmonary/Chest: Effort normal and breath sounds normal.  Abdominal: Normal  appearance and bowel sounds are normal. She exhibits no distension. There is tenderness in the right lower quadrant, suprapubic area and left lower quadrant. There is no rigidity, no rebound, no guarding and no CVA tenderness.    ED Course  Procedures (including critical care time) Labs Review Labs Reviewed  POCT URINALYSIS DIP (DEVICE) - Abnormal; Notable for the following:    Hgb urine dipstick MODERATE (*)    Protein, ur 30 (*)    Leukocytes, UA SMALL (*)    All other components within normal limits  URINE CULTURE    Imaging Review No results found.   MDM   1. UTI (lower urinary tract infection)    rx macrobid 100mg  BID #14. Urine culture sent.   Carvel Getting, NP 07/23/14 1644  Addendum: pt reports macrobid causes nausea and vomiting. Prefers to try keflex. Rx 500mg  QID #28. PT aware of what allergic reaction sx to watch for and to stop medication immediately if has reaction.   Carvel Getting, NP 07/23/14 [phone]

## 2014-07-23 NOTE — ED Notes (Signed)
C/O dysuria, bladder spasms, UTI sxs x 1 wk.  Finished Bactrim last wk for UTI.

## 2014-07-25 LAB — URINE CULTURE

## 2014-08-03 ENCOUNTER — Encounter (HOSPITAL_COMMUNITY): Payer: Self-pay | Admitting: Internal Medicine

## 2014-08-07 ENCOUNTER — Ambulatory Visit (INDEPENDENT_AMBULATORY_CARE_PROVIDER_SITE_OTHER): Payer: 59 | Admitting: *Deleted

## 2014-08-07 ENCOUNTER — Encounter: Payer: Self-pay | Admitting: Internal Medicine

## 2014-08-07 DIAGNOSIS — I441 Atrioventricular block, second degree: Secondary | ICD-10-CM

## 2014-08-07 LAB — MDC_IDC_ENUM_SESS_TYPE_REMOTE
Battery Remaining Longevity: 138 mo
Battery Remaining Percentage: 100 %
Brady Statistic RA Percent Paced: 8 %
Brady Statistic RV Percent Paced: 100 %
Date Time Interrogation Session: 20151214125800
Implantable Pulse Generator Serial Number: 112476
Lead Channel Impedance Value: 405 Ohm
Lead Channel Pacing Threshold Pulse Width: 0.5 ms
Lead Channel Setting Pacing Amplitude: 2 V
Lead Channel Setting Pacing Pulse Width: 0.4 ms
MDC IDC MSMT LEADCHNL RA PACING THRESHOLD AMPLITUDE: 0.6 V
MDC IDC MSMT LEADCHNL RA SENSING INTR AMPL: 3.7 mV
MDC IDC MSMT LEADCHNL RV IMPEDANCE VALUE: 443 Ohm
MDC IDC SET LEADCHNL RV PACING AMPLITUDE: 2.4 V
MDC IDC SET LEADCHNL RV SENSING SENSITIVITY: 2.5 mV
Zone Setting Detection Interval: 375 ms

## 2014-08-07 NOTE — Progress Notes (Signed)
Remote pacemaker transmission.   

## 2014-08-11 ENCOUNTER — Encounter: Payer: Self-pay | Admitting: Cardiology

## 2014-08-28 ENCOUNTER — Encounter (HOSPITAL_COMMUNITY): Payer: Self-pay | Admitting: Emergency Medicine

## 2014-08-28 ENCOUNTER — Emergency Department (HOSPITAL_COMMUNITY)
Admission: EM | Admit: 2014-08-28 | Discharge: 2014-08-28 | Disposition: A | Discharge status: Home / Self Care | Payer: 59 | Source: Home / Self Care | Attending: Family Medicine | Admitting: Family Medicine

## 2014-08-28 DIAGNOSIS — J40 Bronchitis, not specified as acute or chronic: Secondary | ICD-10-CM

## 2014-08-28 MED ORDER — METHYLPREDNISOLONE ACETATE 80 MG/ML IJ SUSP
INTRAMUSCULAR | Status: AC
  Filled 2014-08-28: qty 1

## 2014-08-28 MED ORDER — AZITHROMYCIN 250 MG PO TABS: 250.0000 mg | ORAL_TABLET | Freq: Every day | ORAL | Status: DC

## 2014-08-28 MED ORDER — METHYLPREDNISOLONE ACETATE PF 80 MG/ML IJ SUSP
80.0000 mg | Freq: Once | INTRAMUSCULAR | Status: AC
  Administered 2014-08-28: 80 mg via INTRAMUSCULAR

## 2014-08-28 MED ORDER — IPRATROPIUM BROMIDE 0.06 % NA SOLN: 2.0000 | Freq: Four times a day (QID) | NASAL | Status: DC

## 2014-08-28 NOTE — ED Notes (Signed)
Mother and child treated in same room with same provider

## 2014-08-28 NOTE — ED Provider Notes (Signed)
Kari [name] is a 64 y.o. female who presents to Urgent Care today for cough congestion present for 3 weeks worsening recently. Multiple sick family members at home. 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 The cough is productive. Patient has pain with coughing. No vomiting diarrhea wheezing or shortness of breath. Her symptoms are not consistent with previous episodes of pneumonia.   Past Medical History  Diagnosis Date  . Herpes zoster conjunctivitis   . Menopause   . Diverticulosis   . Pneumonia 2009  . GERD (gastroesophageal reflux disease)     "related to NSAID RX only" (02/17/2013)  . Arthritis     "hands" (02/17/2013)  . Seasonal allergies     "spring & fall; not q year" (02/17/2013)  . Mobitz type 2 second degree AV block     2:1/notes 02/16/2013   Past Surgical History  Procedure Laterality Date  . Vaginal hysterectomy  ~ 1994  . Arthrogram knee Right 1975  . Insert / replace / remove pacemaker  02/17/2013    [facility] Scientific Advantio dual-chamber pacemaker, model [id number],  . Tonsillectomy  1961  . Cholecystectomy  1990's  . Knee arthroscopy Right 1982, [place]  . Knee arthroscopy w/ acl reconstruction Left 1984  . 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 Trigger finger release Right ~ 2012    "thumb" (02/17/2013)  . Permanent pacemaker insertion N/A 02/17/2013    Procedure: PERMANENT PACEMAKER INSERTION;  Surgeon: Evans Lance, MD;  Location: [facility] CATH LAB;  Service: Cardiovascular;  Laterality: N/A;   History  Substance Use Topics  . Smoking status: Never Smoker   . Smokeless tobacco: Never Used  . Alcohol Use: Not on file   ROS as above Medications: No current facility-administered medications for this encounter.   Current Outpatient Prescriptions  Medication Sig Dispense Refill  . aspirin EC 81 MG tablet Take 81 mg by mouth daily.    Marland Kitchen azithromycin (ZITHROMAX) 250 MG tablet Take 1  tablet (250 mg total) by mouth daily. Take first 2 tablets together, then 1 every day until finished. 6 tablet 0  . cephALEXin (KEFLEX) 500 MG capsule Take 1 capsule (500 mg total) by mouth 4 (four) times daily. 28 capsule 0  . fluticasone (FLONASE) 50 MCG/ACT nasal spray Place into both nostrils daily.    Marland Kitchen HYDROcodone-acetaminophen (NORCO/VICODIN) 5-325 MG per tablet Take 1 tablet by mouth 3 (three) times daily as needed for pain.     Marland Kitchen ipratropium (ATROVENT) 0.06 % nasal spray Place 2 sprays into both nostrils 4 (four) times daily. 15 mL 1  . Multiple Vitamin (MULTIVITAMIN WITH MINERALS) TABS Take 1 tablet by mouth daily.    . nitrofurantoin, macrocrystal-monohydrate, (MACROBID) 100 MG capsule Take 1 capsule (100 mg total) by mouth 2 (two) times daily. 14 capsule 0  . Omega-3 Fatty Acids (FISH OIL) 600 MG CAPS Take 4 capsules by mouth daily.    . Probiotic Product (PROBIOTIC FORMULA PO) Take 2 capsules by mouth every morning.     . valACYclovir (VALTREX) 1000 MG tablet Take 1,000 mg by mouth as needed.     Allergies  Allergen Reactions  . Ceftin Hives    Tolerates amoxicillin  . Levofloxacin Other (See Comments)    "spacey," tolerates Cipro  . Vancomycin Hives     Exam:  BP 121/92 mmHg  Pulse 102  Temp(Src) 97.6 F (36.4 C) (Oral)  Resp 14  SpO2 100% Gen: Well NAD  HEENT: EOMI,  MMM cobblestoning of posterior pharynx. Normal tympanic membranes bilaterally. Clear nasal discharge present. Lungs: Normal work of breathing. CTABL Heart: RRR no MRG Abd: NABS, Soft. Nondistended, Nontender Exts: Brisk capillary refill, warm and well perfused.   Patient was given 80 mg IM Depo-Medrol prior to discharge  No results found for this or any previous visit (from the past 24 hour(s)). No results found.  Assessment and Plan: 64 y.o. female with bronchitis with viral sinusitis. Patient does display some second sickening. Will treat with Depo-Medrol as above and Atrovent nasal spray.  Azithromycin for use if not better.  Discussed warning signs or symptoms. Please see discharge instructions. Patient expresses understanding.     Gregor Hams, MD 08/28/14 [phone]

## 2014-08-28 NOTE — Discharge Instructions (Signed)
Thank you for coming in today. Use Atrovent nasal spray Take azithromycin if not improving Call or go to the emergency room if you get worse, have trouble breathing, have chest pains, or palpitations.   Acute Bronchitis Bronchitis is inflammation of the airways that extend from the windpipe into the lungs (bronchi). The inflammation often causes mucus to develop. This leads to a cough, which is the most common symptom of bronchitis.  In acute bronchitis, the condition usually develops suddenly and goes away over time, usually in a couple weeks. Smoking, allergies, and asthma can make bronchitis worse. Repeated episodes of bronchitis may cause further lung problems.  CAUSES Acute bronchitis is most often caused by the same virus that causes a cold. The virus can spread from person to person (contagious) through coughing, sneezing, and touching contaminated objects. SIGNS AND SYMPTOMS   Cough.   Fever.   Coughing up mucus.   Body aches.   Chest congestion.   Chills.   Shortness of breath.   Sore throat.  DIAGNOSIS  Acute bronchitis is usually diagnosed through a physical exam. Your health care provider will also ask you questions about your medical history. Tests, such as chest X-rays, are sometimes done to rule out other conditions.  TREATMENT  Acute bronchitis usually goes away in a couple weeks. Oftentimes, no medical treatment is necessary. Medicines are sometimes given for relief of fever or cough. Antibiotic medicines are usually not needed but may be prescribed in certain situations. In some cases, an inhaler may be recommended to help reduce shortness of breath and control the cough. A cool mist vaporizer may also be used to help thin bronchial secretions and make it easier to clear the chest.  HOME CARE INSTRUCTIONS  Get plenty of rest.   Drink enough fluids to keep your urine clear or pale yellow (unless you have a medical condition that requires fluid  restriction). Increasing fluids may help thin your respiratory secretions (sputum) and reduce chest congestion, and it will prevent dehydration.   Take medicines only as directed by your health care provider.  If you were prescribed an antibiotic medicine, finish it all even if you start to feel better.  Avoid smoking and secondhand smoke. Exposure to cigarette smoke or irritating chemicals will make bronchitis worse. If you are a smoker, consider using nicotine gum or skin patches to help control withdrawal symptoms. Quitting smoking will help your lungs heal faster.   Reduce the chances of another bout of acute bronchitis by washing your hands frequently, avoiding people with cold symptoms, and trying not to touch your hands to your mouth, nose, or eyes.   Keep all follow-up visits as directed by your health care provider.  SEEK MEDICAL CARE IF: Your symptoms do not improve after 1 week of treatment.  SEEK IMMEDIATE MEDICAL CARE IF:  You develop an increased fever or chills.   You have chest pain.   You have severe shortness of breath.  You have bloody sputum.   You develop dehydration.  You faint or repeatedly feel like you are going to pass out.  You develop repeated vomiting.  You develop a severe headache. MAKE SURE YOU:   Understand these instructions.  Will watch your condition.  Will get help right away if you are not doing well or get worse. Document Released: 09/18/2004 Document Revised: 12/26/2013 Document Reviewed: 02/01/2013 [facility] Patient Information 2015 [place], [place]. This information is not intended to replace advice given to you by your health care provider. Make  sure you discuss any questions you have with your health care provider.

## 2014-08-28 NOTE — ED Notes (Signed)
Cough for 3 weeks, intermittent, thick, yellow phlegm.  Runny nose, cough, sore chest , no fever

## 2014-10-19 ENCOUNTER — Encounter (HOSPITAL_BASED_OUTPATIENT_CLINIC_OR_DEPARTMENT_OTHER): Payer: Self-pay | Admitting: Emergency Medicine

## 2014-10-19 ENCOUNTER — Emergency Department (HOSPITAL_BASED_OUTPATIENT_CLINIC_OR_DEPARTMENT_OTHER): Payer: 59

## 2014-10-19 ENCOUNTER — Emergency Department (HOSPITAL_BASED_OUTPATIENT_CLINIC_OR_DEPARTMENT_OTHER)
Admission: EM | Admit: 2014-10-19 | Discharge: 2014-10-19 | Disposition: A | Discharge status: Home / Self Care | Payer: 59 | Attending: Emergency Medicine | Admitting: Emergency Medicine

## 2014-10-19 DIAGNOSIS — Z7951 Long term (current) use of inhaled steroids: Secondary | ICD-10-CM | POA: Diagnosis not present

## 2014-10-19 DIAGNOSIS — Z79899 Other long term (current) drug therapy: Secondary | ICD-10-CM | POA: Diagnosis not present

## 2014-10-19 DIAGNOSIS — Z7982 Long term (current) use of aspirin: Secondary | ICD-10-CM | POA: Diagnosis not present

## 2014-10-19 DIAGNOSIS — Z8619 Personal history of other infectious and parasitic diseases: Secondary | ICD-10-CM | POA: Diagnosis not present

## 2014-10-19 DIAGNOSIS — K5732 Diverticulitis of large intestine without perforation or abscess without bleeding: Secondary | ICD-10-CM | POA: Diagnosis not present

## 2014-10-19 DIAGNOSIS — Z8709 Personal history of other diseases of the respiratory system: Secondary | ICD-10-CM | POA: Insufficient documentation

## 2014-10-19 DIAGNOSIS — Z9049 Acquired absence of other specified parts of digestive tract: Secondary | ICD-10-CM | POA: Diagnosis not present

## 2014-10-19 DIAGNOSIS — R1032 Left lower quadrant pain: Secondary | ICD-10-CM | POA: Diagnosis present

## 2014-10-19 DIAGNOSIS — Z792 Long term (current) use of antibiotics: Secondary | ICD-10-CM | POA: Insufficient documentation

## 2014-10-19 DIAGNOSIS — Z8701 Personal history of pneumonia (recurrent): Secondary | ICD-10-CM | POA: Diagnosis not present

## 2014-10-19 DIAGNOSIS — M1990 Unspecified osteoarthritis, unspecified site: Secondary | ICD-10-CM | POA: Insufficient documentation

## 2014-10-19 DIAGNOSIS — Z950 Presence of cardiac pacemaker: Secondary | ICD-10-CM | POA: Diagnosis not present

## 2014-10-19 LAB — CBC WITH DIFFERENTIAL/PLATELET
BASOS PCT: 0 % (ref 0–1)
Basophils Absolute: 0 10*3/uL (ref 0.0–0.1)
Eosinophils Absolute: 0.1 10*3/uL (ref 0.0–0.7)
Eosinophils Relative: 1 % (ref 0–5)
HCT: 41.6 % (ref 36.0–46.0)
HEMOGLOBIN: 13.9 g/dL (ref 12.0–15.0)
LYMPHS PCT: 29 % (ref 12–46)
Lymphs Abs: 3.1 10*3/uL (ref 0.7–4.0)
MCH: 31.1 pg (ref 26.0–34.0)
MCHC: 33.4 g/dL (ref 30.0–36.0)
MCV: 93.1 fL (ref 78.0–100.0)
Monocytes Absolute: 0.9 10*3/uL (ref 0.1–1.0)
Monocytes Relative: 8 % (ref 3–12)
NEUTROS ABS: 6.7 10*3/uL (ref 1.7–7.7)
Neutrophils Relative %: 62 % (ref 43–77)
PLATELETS: 296 10*3/uL (ref 150–400)
RBC: 4.47 MIL/uL (ref 3.87–5.11)
RDW: 14.4 % (ref 11.5–15.5)
WBC: 10.7 10*3/uL — ABNORMAL HIGH (ref 4.0–10.5)

## 2014-10-19 LAB — BASIC METABOLIC PANEL
Anion gap: 3 — ABNORMAL LOW (ref 5–15)
BUN: 13 mg/dL (ref 6–23)
CALCIUM: 9.2 mg/dL (ref 8.4–10.5)
CO2: 27 mmol/L (ref 19–32)
Chloride: 104 mmol/L (ref 96–112)
Creatinine, Ser: 0.81 mg/dL (ref 0.50–1.10)
GFR calc Af Amer: 88 mL/min — ABNORMAL LOW (ref >90)
GFR calc non Af Amer: 76 mL/min — ABNORMAL LOW (ref >90)
Glucose, Bld: 113 mg/dL — ABNORMAL HIGH (ref 70–99)
Potassium: 3.1 mmol/L — ABNORMAL LOW (ref 3.5–5.1)
Sodium: 134 mmol/L — ABNORMAL LOW (ref 135–145)

## 2014-10-19 MED ORDER — IOHEXOL 300 MG/ML  SOLN
50.0000 mL | Freq: Once | INTRAMUSCULAR | Status: AC | PRN
  Administered 2014-10-19: 50 mL via ORAL

## 2014-10-19 MED ORDER — ONDANSETRON HCL 4 MG/2ML IJ SOLN
INTRAMUSCULAR | Status: AC
  Filled 2014-10-19: qty 2

## 2014-10-19 MED ORDER — IOHEXOL 300 MG/ML  SOLN
100.0000 mL | Freq: Once | INTRAMUSCULAR | Status: AC | PRN
  Administered 2014-10-19: 100 mL via INTRAVENOUS

## 2014-10-19 MED ORDER — CIPROFLOXACIN HCL 500 MG PO TABS: 500.0000 mg | ORAL_TABLET | Freq: Two times a day (BID) | ORAL | Status: DC

## 2014-10-19 MED ORDER — SODIUM CHLORIDE 0.9 % IV BOLUS (SEPSIS)
1000.0000 mL | Freq: Once | INTRAVENOUS | Status: AC
  Administered 2014-10-19: 1000 mL via INTRAVENOUS

## 2014-10-19 MED ORDER — ONDANSETRON HCL 4 MG/2ML IJ SOLN
4.0000 mg | Freq: Once | INTRAMUSCULAR | Status: AC
  Administered 2014-10-19: 4 mg via INTRAVENOUS

## 2014-10-19 MED ORDER — MORPHINE SULFATE 4 MG/ML IJ SOLN
4.0000 mg | Freq: Once | INTRAMUSCULAR | Status: AC
  Administered 2014-10-19: 4 mg via INTRAVENOUS
  Filled 2014-10-19: qty 1

## 2014-10-19 MED ORDER — HYDROCODONE-ACETAMINOPHEN 5-325 MG PO TABS: 2.0000 | ORAL_TABLET | ORAL | Status: DC | PRN

## 2014-10-19 MED ORDER — FENTANYL CITRATE 0.05 MG/ML IJ SOLN
100.0000 ug | Freq: Once | INTRAMUSCULAR | Status: AC
  Administered 2014-10-19: 100 ug via INTRAVENOUS
  Filled 2014-10-19: qty 2

## 2014-10-19 MED ORDER — METRONIDAZOLE IN NACL 5-0.79 MG/ML-% IV SOLN
500.0000 mg | Freq: Once | INTRAVENOUS | Status: AC
  Administered 2014-10-19: 500 mg via INTRAVENOUS
  Filled 2014-10-19: qty 100

## 2014-10-19 MED ORDER — CIPROFLOXACIN IN D5W 400 MG/200ML IV SOLN
400.0000 mg | Freq: Once | INTRAVENOUS | Status: AC
  Administered 2014-10-19: 400 mg via INTRAVENOUS
  Filled 2014-10-19: qty 200

## 2014-10-19 MED ORDER — ONDANSETRON HCL 4 MG/2ML IJ SOLN
4.0000 mg | Freq: Once | INTRAMUSCULAR | Status: AC
  Administered 2014-10-19: 4 mg via INTRAVENOUS
  Filled 2014-10-19: qty 2

## 2014-10-19 MED ORDER — METRONIDAZOLE 500 MG PO TABS: 500.0000 mg | ORAL_TABLET | Freq: Three times a day (TID) | ORAL | Status: DC

## 2014-10-19 NOTE — ED Provider Notes (Addendum)
CSN: [id number]     Arrival date & time 10/19/14  6734 History   First MD Initiated Contact with Patient 10/19/14 [phone]     Chief Complaint  Patient presents with  . Abdominal Pain     (Consider location/radiation/quality/duration/timing/severity/associated sxs/prior Treatment) HPI  This is a 64 year old female with a history of diverticulitis. She was recently treated with an outpatient course of ciprofloxacin and Flagyl for symptoms consistent with previous episodes of diverticulitis. Her pain returned yesterday afternoon. The onset was acute. The pain is located in the left lower quadrant and radiates "through my whole colon". The pain is worse with movement or sitting. It is moderate to severe and worse than previous episodes of diverticulitis. She has had some mild nausea but no vomiting. She has had diarrhea that is nonbloody. She has had chills but no fever. She denies chest pain, shortness of breath, urinary symptoms or abdominal distention.   Past Medical History  Diagnosis Date  . Herpes zoster conjunctivitis   . Menopause   . Diverticulosis   . Pneumonia 2009  . GERD (gastroesophageal reflux disease)     "related to NSAID RX only" (02/17/2013)  . Arthritis     "hands" (02/17/2013)  . Seasonal allergies     "spring & fall; not q year" (02/17/2013)  . Mobitz type 2 second degree AV block     2:1/notes 02/16/2013   Past Surgical History  Procedure Laterality Date  . Vaginal hysterectomy  ~ 1994  . Arthrogram knee Right 1975  . Insert / replace / remove pacemaker  02/17/2013    [facility] Scientific Advantio dual-chamber pacemaker, model [id number],  . Tonsillectomy  1961  . Cholecystectomy  1990's  . Knee arthroscopy Right 1982, [place]  . Knee arthroscopy w/ acl reconstruction Left 1984  . 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 Trigger finger release Right ~ 2012    "thumb" (02/17/2013)  . Permanent pacemaker  insertion N/A 02/17/2013    Procedure: PERMANENT PACEMAKER INSERTION;  Surgeon: Evans Lance, MD;  Location[facility] C[facility] LAB;  Service: Cardiovascular;  Laterality: N/A;  . Achilles tendon surgery     Family History  Problem Relation Age of Onset  . Hypertension Mother   . Hyperlipidemia Mother   . Macular degeneration Mother   . COPD Mother   . Lung cancer Father   . Cancer Father     Lung  . COPD Father   . Diabetes Brother   . Breast cancer Paternal Aunt    History  Substance Use Topics  . Smoking status: Never Smoker   . Smokeless tobacco: Never Used  . Alcohol Use: No   OB History    Gravida Para Term Preterm AB TAB SAB Ectopic Multiple Living   1 1             Review of Systems  All other systems reviewed and are negative.   Allergies  Ceftin; Levofloxacin; and Vancomycin  Home Medications   Prior to Admission medications   Medication Sig Start Date End Date Taking? Authorizing Provider  aspirin EC 81 MG tablet Take 81 mg by mouth daily.    Historical Provider, MD  azithromycin (ZITHROMAX) 250 MG tablet Take 1 tablet (250 mg total) by mouth daily. Take first 2 tablets together, then 1 every day until finished. 08/28/14   Gregor Hams, MD  cephALEXin (KEFLEX) 500 MG capsule Take 1 capsule (500 mg total) by mouth  4 (four) times daily. 07/23/14   Carvel Getting, NP  ciprofloxacin (CIPRO) 500 MG tablet Take 1 tablet (500 mg total) by mouth 2 (two) times daily. 10/19/14   Orpah Greek, MD  fluticasone (FLONASE) 50 MCG/ACT nasal spray Place into both nostrils daily.    Historical Provider, MD  HYDROcodone-acetaminophen (NORCO/VICODIN) 5-325 MG per tablet Take 2 tablets by mouth every 4 (four) hours as needed for moderate pain. 10/19/14   Orpah Greek, MD  ipratropium (ATROVENT) 0.06 % nasal spray Place 2 sprays into both nostrils 4 (four) times daily. 08/28/14   Gregor Hams, MD  metroNIDAZOLE (FLAGYL) 500 MG tablet Take 1 tablet (500 mg total) by mouth 3  (three) times daily. 10/19/14   Orpah Greek, MD  Multiple Vitamin (MULTIVITAMIN WITH MINERALS) TABS Take 1 tablet by mouth daily.    Historical Provider, MD  nitrofurantoin, macrocrystal-monohydrate, (MACROBID) 100 MG capsule Take 1 capsule (100 mg total) by mouth 2 (two) times daily. 07/23/14   Carvel Getting, NP  Omega-3 Fatty Acids (FISH OIL) 600 MG CAPS Take 4 capsules by mouth daily.    Historical Provider, MD  Probiotic Product (PROBIOTIC FORMULA PO) Take 2 capsules by mouth every morning.     Historical Provider, MD  valACYclovir (VALTREX) 1000 MG tablet Take 1,000 mg by mouth as needed.    Historical Provider, MD   BP 118/72 mmHg  Pulse 72  Temp(Src) 98.6 F (37 C) (Oral)  Resp 20  SpO2 100%   Physical Exam General: Well-developed, well-nourished female in no acute distress; appearance consistent with age of record HENT: normocephalic; atraumatic Eyes: pupils equal, round and reactive to light; extraocular muscles intact Neck: supple Heart: regular rate and rhythm Lungs: clear to auscultation bilaterally Abdomen: soft; nondistended; left lower quadrant tenderness; no masses or hepatosplenomegaly; bowel sounds present Extremities: No deformity; full range of motion; pulses normal Neurologic: Awake, alert and oriented; motor function intact in all extremities and symmetric; no facial droop Skin: Warm and dry Psychiatric: Normal mood and affect    ED Course  Procedures (including critical care time)   MDM   7:00AM Labs and CT pending. Dr. Betsey Holiday will follow up on results and make disposition.  Nursing notes and vitals signs, including pulse oximetry, reviewed.  Summary of this visit's results, reviewed by myself:  Labs:  Results for orders placed or performed during the hospital encounter of 10/19/14 (from the past 24 hour(s))  CBC with Differential/Platelet     Status: Abnormal   Collection Time: 10/19/14  7:10 AM  Result Value Ref Range   WBC 10.7 (H)  4.0 - 10.5 K/uL   RBC 4.47 3.87 - 5.11 MIL/uL   Hemoglobin 13.9 12.0 - 15.0 g/dL   HCT 41.6 36.0 - 46.0 %   MCV 93.1 78.0 - 100.0 fL   MCH 31.1 26.0 - 34.0 pg   MCHC 33.4 30.0 - 36.0 g/dL   RDW 14.4 11.5 - 15.5 %   Platelets 296 150 - 400 K/uL   Neutrophils Relative % 62 43 - 77 %   Neutro Abs 6.7 1.7 - 7.7 K/uL   Lymphocytes Relative 29 12 - 46 %   Lymphs Abs 3.1 0.7 - 4.0 K/uL   Monocytes Relative 8 3 - 12 %   Monocytes Absolute 0.9 0.1 - 1.0 K/uL   Eosinophils Relative 1 0 - 5 %   Eosinophils Absolute 0.1 0.0 - 0.7 K/uL   Basophils Relative 0 0 - 1 %  Basophils Absolute 0.0 0.0 - 0.1 K/uL  Basic metabolic panel     Status: Abnormal   Collection Time: 10/19/14  7:10 AM  Result Value Ref Range   Sodium 134 (L) 135 - 145 mmol/L   Potassium 3.1 (L) 3.5 - 5.1 mmol/L   Chloride 104 96 - 112 mmol/L   CO2 27 19 - 32 mmol/L   Glucose, Bld 113 (H) 70 - 99 mg/dL   BUN 13 6 - 23 mg/dL   Creatinine, Ser 0.81 0.50 - 1.10 mg/dL   Calcium 9.2 8.4 - 10.5 mg/dL   GFR calc non Af Amer 76 (L) >90 mL/min   GFR calc Af Amer 88 (L) >90 mL/min   Anion gap 3 (L) 5 - 15    Imaging Studies: Ct Abdomen Pelvis W Contrast  10/19/2014   CLINICAL DATA:  Left lower abdominal pain, nausea starting yesterday, recent treatment for diverticulitis  EXAM: CT ABDOMEN AND PELVIS WITH CONTRAST  TECHNIQUE: Multidetector CT imaging of the abdomen and pelvis was performed using the standard protocol following bolus administration of intravenous contrast.  CONTRAST:  26mL OMNIPAQUE IOHEXOL 300 MG/ML SOLN, 157mL OMNIPAQUE IOHEXOL 300 MG/ML SOLN  COMPARISON:  09/24/2012  FINDINGS: Lung bases are unremarkable. Partially visualized cardiac pacemaker leads.  Sagittal images of the spine shows disc space flattening with mild anterior and mild posterior spurring at L5-S1 level.  Enhanced liver shows no focal mass. The patient is status postcholecystectomy. Enhanced pancreas, spleen and adrenal glands are unremarkable. Enhanced  kidneys are symmetrical in size. No hydronephrosis or hydroureter. Delayed renal images shows bilateral renal symmetrical excretion. Bilateral visualized proximal ureter is unremarkable.  No small bowel obstruction. No ascites or free air. No adenopathy. No aortic aneurysm. Normal retrocecal appendix. No pericecal inflammation. The terminal ileum is unremarkable. Scattered diverticula are noted descending colon. No evidence of acute diverticulitis. Colonic diverticula are noted in sigmoid colon. In axial image 67 there is short segment of focal thickening of colonic wall in distal sigmoid colon and mild stranding of surrounding pericolonic fat in left posterior pelvis. Findings are consistent with mild focal colitis or acute diverticulitis. There is no pericolonic abscess. Moderate gas noted within rectum. No distal colonic obstruction. The urinary bladder is unremarkable. The patient is status post hysterectomy.  IMPRESSION: 1. Colonic diverticula are noted in sigmoid colon. In axial image 67 there is short segment of focal thickening of colonic wall in distal sigmoid colon and mild stranding of surrounding pericolonic fat in left posterior pelvis. Findings are consistent with mild focal colitis or acute diverticulitis. There is no pericolonic abscess. 2. Scattered diverticula are noted descending colon. No evidence of acute diverticulitis. 3. Status postcholecystectomy. 4. Normal appendix.  No pericecal inflammation. 5. No small bowel obstruction. 6. Status post hysterectomy.   Electronically Signed   By: Lahoma Crocker M.D.   On: 10/19/2014 09:36     Wynetta Fines, MD 10/19/14 0703  Wynetta Fines, MD 10/19/14 [phone]

## 2014-10-19 NOTE — Discharge Instructions (Signed)

## 2014-10-19 NOTE — ED Notes (Signed)
Lower abdominal pain persistent s/p completing treatment for diverticulitis

## 2014-10-19 NOTE — ED Provider Notes (Signed)
Patient signed out to me to follow-up on CT scan. Patient was recently treated for diverticulitis, improved with treatment course, but then several days later, started having pain again. CT scan shows mild inflammatory process consistent with colitis or acute diverticulitis. No complicating features. Case discussed briefly with Dr. Collene Mares, patient's gastroenterologist. She recommended extended course of Cipro and Flagyl (one month) and will see the patient in the office in 2 weeks.  Orpah Greek, MD 10/19/14 [phone]

## 2014-10-19 NOTE — ED Notes (Signed)
No change on pain from fentanyl. Pt does not want anymore pain meds at this time.

## 2014-11-08 ENCOUNTER — Ambulatory Visit (INDEPENDENT_AMBULATORY_CARE_PROVIDER_SITE_OTHER): Payer: 59 | Admitting: *Deleted

## 2014-11-08 DIAGNOSIS — I441 Atrioventricular block, second degree: Secondary | ICD-10-CM

## 2014-11-08 NOTE — Progress Notes (Signed)
Remote pacemaker transmission.   

## 2014-11-09 LAB — MDC_IDC_ENUM_SESS_TYPE_REMOTE
Brady Statistic RV Percent Paced: 99 %
Date Time Interrogation Session: 20160316110500
Lead Channel Impedance Value: 441 Ohm
Lead Channel Pacing Threshold Amplitude: 0.6 V
Lead Channel Pacing Threshold Pulse Width: 0.5 ms
Lead Channel Setting Pacing Amplitude: 2 V
Lead Channel Setting Pacing Pulse Width: 0.4 ms
MDC IDC MSMT BATTERY REMAINING LONGEVITY: 132 mo
MDC IDC MSMT BATTERY REMAINING PERCENTAGE: 100 %
MDC IDC MSMT LEADCHNL RA IMPEDANCE VALUE: 448 Ohm
MDC IDC PG SERIAL: 112476
MDC IDC SET LEADCHNL RV PACING AMPLITUDE: 2.4 V
MDC IDC SET LEADCHNL RV SENSING SENSITIVITY: 2.5 mV
MDC IDC STAT BRADY RA PERCENT PACED: 8 %
Zone Setting Detection Interval: 375 ms

## 2014-11-16 ENCOUNTER — Encounter: Payer: Self-pay | Admitting: Cardiology

## 2014-11-23 ENCOUNTER — Encounter: Payer: Self-pay | Admitting: Internal Medicine

## 2014-11-30 ENCOUNTER — Encounter: Payer: Self-pay | Admitting: Cardiology

## 2014-12-01 ENCOUNTER — Other Ambulatory Visit: Payer: Self-pay | Admitting: General Surgery

## 2014-12-01 MED ORDER — DEXTROSE 5 % IV SOLN: 900.0000 mg | INTRAVENOUS | Status: DC

## 2014-12-01 MED ORDER — DEXTROSE 5 % IV SOLN: 5.0000 mg/kg | INTRAVENOUS | Status: DC

## 2014-12-27 NOTE — Pre-Procedure Instructions (Signed)
[place]  12/27/2014   Your procedure is scheduled on:  Mon, May 9 @ 7:30 AM  Report to [facility]  at 5:30 AM.  Call this number if you have problems the morning of surgery: [phone]   Remember:   Do not eat food or drink liquids after midnight.   Take these medicines the morning of surgery with A SIP OF WATER: Flonase(Fluticasone) and Pain Pill(if needed)             Stop taking your Aspirin and Fish Oil. No Goody's,BC's,Aleve,Ibuprofen,or any Herbal Medications.    Do not wear jewelry, make-up or nail polish.  Do not wear lotions, powders, or perfumes.   Do not shave 48 hours prior to surgery.   Do not bring valuables to the hospital.  [facility] is not responsible                  for any belongings or valuables.               Contacts, dentures or bridgework may not be worn into surgery.  Leave suitcase in the car. After surgery it may be brought to your room.  For patients admitted to the hospital, discharge time is determined by your                treatment team.                 Special Instructions:   - Preparing for Surgery  Before surgery, you can play an important role.  Because skin is not sterile, your skin needs to be as free of germs as possible.  You can reduce the number of germs on you skin by washing with CHG (chlorahexidine gluconate) soap before surgery.  CHG is an antiseptic cleaner which kills germs and bonds with the skin to continue killing germs even after washing.  Please DO NOT use if you have an allergy to CHG or antibacterial soaps.  If your skin becomes reddened/irritated stop using the CHG and inform your nurse when you arrive at Short Stay.  Do not shave (including legs and underarms) for at least 48 hours prior to the first CHG shower.  You may shave your face.  Please follow these instructions carefully:   1.  Shower with CHG Soap the night before surgery and the                                morning of  Surgery.  2.  If you choose to wash your hair, wash your hair first as usual with your       normal shampoo.  3.  After you shampoo, rinse your hair and body thoroughly to remove the                      Shampoo.  4.  Use CHG as you would any other liquid soap.  You can apply chg directly       to the skin and wash gently with scrungie or a clean washcloth.  5.  Apply the CHG Soap to your body ONLY FROM THE NECK DOWN.        Do not use on open wounds or open sores.  Avoid contact with your eyes,       ears, mouth and genitals (private parts).  Wash genitals (private parts)  with your normal soap.  6.  Wash thoroughly, paying special attention to the area where your surgery        will be performed.  7.  Thoroughly rinse your body with warm water from the neck down.  8.  DO NOT shower/wash with your normal soap after using and rinsing off       the CHG Soap.  9.  Pat yourself dry with a clean towel.            10.  Wear clean pajamas.            11.  Place clean sheets on your bed the night of your first shower and do not        sleep with pets.  Day of Surgery  Do not apply any lotions/deoderants the morning of surgery.  Please wear clean clothes to the hospital/surgery center.     Please read over the following fact sheets that you were given: Pain Booklet, Coughing and Deep Breathing and Surgical Site Infection Prevention

## 2014-12-28 ENCOUNTER — Encounter (HOSPITAL_COMMUNITY)
Admission: RE | Admit: 2014-12-28 | Discharge: 2014-12-28 | Disposition: A | Discharge status: Home / Self Care | Payer: 59 | Source: Ambulatory Visit | Attending: General Surgery | Admitting: General Surgery

## 2014-12-28 ENCOUNTER — Encounter (HOSPITAL_COMMUNITY): Payer: Self-pay

## 2014-12-28 ENCOUNTER — Other Ambulatory Visit: Payer: Self-pay

## 2014-12-28 HISTORY — DX: Presence of cardiac pacemaker: Z95.0

## 2014-12-28 HISTORY — DX: Dorsalgia, unspecified: M54.9

## 2014-12-28 HISTORY — DX: Diverticulitis of intestine, part unspecified, without perforation or abscess without bleeding: K57.92

## 2014-12-28 HISTORY — DX: Other chronic pain: G89.29

## 2014-12-28 LAB — CBC
HCT: 41.1 % (ref 36.0–46.0)
HEMOGLOBIN: 13.6 g/dL (ref 12.0–15.0)
MCH: 30.7 pg (ref 26.0–34.0)
MCHC: 33.1 g/dL (ref 30.0–36.0)
MCV: 92.8 fL (ref 78.0–100.0)
PLATELETS: 258 10*3/uL (ref 150–400)
RBC: 4.43 MIL/uL (ref 3.87–5.11)
RDW: 14 % (ref 11.5–15.5)
WBC: 5.5 10*3/uL (ref 4.0–10.5)

## 2014-12-28 LAB — BASIC METABOLIC PANEL
ANION GAP: 13 (ref 5–15)
BUN: 17 mg/dL (ref 6–20)
CALCIUM: 9.4 mg/dL (ref 8.9–10.3)
CO2: 20 mmol/L — AB (ref 22–32)
CREATININE: 0.85 mg/dL (ref 0.44–1.00)
Chloride: 107 mmol/L (ref 101–111)
GFR calc Af Amer: >60 mL/min (ref >60)
GFR calc non Af Amer: >60 mL/min (ref >60)
Glucose, Bld: 98 mg/dL (ref 70–99)
Potassium: 4.2 mmol/L (ref 3.5–5.1)
Sodium: 140 mmol/L (ref 135–145)

## 2014-12-28 LAB — TYPE AND SCREEN
ABO/RH(D): A POS
ANTIBODY SCREEN: NEGATIVE

## 2014-12-28 LAB — ABO/RH: ABO/RH(D): A POS

## 2014-12-28 MED ORDER — CHLORHEXIDINE GLUCONATE CLOTH 2 % EX PADS
6.0000 | MEDICATED_PAD | Freq: Once | CUTANEOUS | Status: DC
  Filled 2014-12-28: qty 6

## 2014-12-28 NOTE — Progress Notes (Signed)
Pages Dr.Byerly to see about Entereg. No orders on this but just verifying if she wants it.

## 2014-12-28 NOTE — Progress Notes (Addendum)
Pt doesn't have a cardiologist  Dr.Gregg Lovena Le manages Pacemaker  Echo report in epic from 2014  Stress test in epic from 2014  Denies ever having a heart cath  Medical Md is Dr.Deborah Coralyn Mark  Denies EKG/CXR

## 2014-12-28 NOTE — Progress Notes (Signed)
Spoke with [employer] with [employer] to give him a heads up about surgery scheduled on Mon, May 9 @ 7:30 AM. States he put it on his calandar if he is needed.

## 2014-12-29 LAB — HEMOGLOBIN A1C
Hgb A1c MFr Bld: 5.9 % — ABNORMAL HIGH (ref 4.8–5.6)
Mean Plasma Glucose: 123 mg/dL

## 2014-12-29 NOTE — Progress Notes (Signed)
Anesthesia Chart Review:  Pt is 64 year old female scheduled for laparoscopic sigmoid colectomy for recurrent diverticulitis on 01/01/2015 with Dr. Barry Dienes.   Cardiologist is Dr. Cristopher Peru, last office visit 01/31/14. Next office visit 02/07/15. PCP is Dr. Emi Belfast.   PMH includes: Mobitz type 2 second degree block, pacemaker [employer]; last device check 11/09/14). Never smoker. BMI 25.   Preoperative labs reviewed.    EKG: atrial-sensed ventricular-paced rhythm with prolonged AV conduction.   Nuclear stress test 04/26/2013: Low risk stress nuclear study. There is a small fixed defect of the distal anterior wall that may be due to breast artifact. The distal anterior wall contracts normally. LV Ejection Fraction: 65%. LV Wall Motion: NL LV Function; NL Wall Motion.  Echo 02/19/2013: - Left ventricle: The cavity size was normal. Systolic function was normal. The estimated ejection fraction was in the range of 55% to 60%. Wall motion was normal; there were no regional wall motion abnormalities. Left ventricular diastolic function parameters were normal. - Mitral valve: Mild elongation of the anterior leaflet. Mild regurgitation. - Atrial septum: No defect or patent foramen ovale was identified.  Perioperative prescription form notes procedure may interfere with device function. Magnet should be placed over device during procedure. Post-op interrogation not needed.   If no changes, I anticipate pt can proceed with surgery as scheduled.   Willeen Cass, FNP-BC [facility]/Anesthesiology Phone: [phone] 12/29/2014 1:24 PM

## 2015-01-01 ENCOUNTER — Encounter (HOSPITAL_COMMUNITY)
Admission: RE | Disposition: A | Discharge status: Home / Self Care | Payer: Self-pay | Source: Ambulatory Visit | Attending: General Surgery

## 2015-01-01 ENCOUNTER — Encounter (HOSPITAL_COMMUNITY): Payer: Self-pay

## 2015-01-01 ENCOUNTER — Inpatient Hospital Stay (HOSPITAL_COMMUNITY)
Admission: RE | Admit: 2015-01-01 | Discharge: 2015-01-04 | DRG: 331 | Disposition: A | Discharge status: Home / Self Care | Payer: 59 | Source: Ambulatory Visit | Attending: General Surgery | Admitting: General Surgery

## 2015-01-01 ENCOUNTER — Ambulatory Visit (HOSPITAL_COMMUNITY): Payer: 59 | Admitting: Certified Registered Nurse Anesthetist

## 2015-01-01 ENCOUNTER — Ambulatory Visit (HOSPITAL_COMMUNITY): Payer: 59 | Admitting: Emergency Medicine

## 2015-01-01 DIAGNOSIS — Z881 Allergy status to other antibiotic agents status: Secondary | ICD-10-CM | POA: Diagnosis not present

## 2015-01-01 DIAGNOSIS — Z96651 Presence of right artificial knee joint: Secondary | ICD-10-CM | POA: Diagnosis present

## 2015-01-01 DIAGNOSIS — G8929 Other chronic pain: Secondary | ICD-10-CM | POA: Diagnosis present

## 2015-01-01 DIAGNOSIS — Z90710 Acquired absence of both cervix and uterus: Secondary | ICD-10-CM | POA: Diagnosis not present

## 2015-01-01 DIAGNOSIS — K5792 Diverticulitis of intestine, part unspecified, without perforation or abscess without bleeding: Secondary | ICD-10-CM | POA: Diagnosis present

## 2015-01-01 DIAGNOSIS — Z950 Presence of cardiac pacemaker: Secondary | ICD-10-CM

## 2015-01-01 DIAGNOSIS — Z79899 Other long term (current) drug therapy: Secondary | ICD-10-CM | POA: Diagnosis not present

## 2015-01-01 DIAGNOSIS — M1990 Unspecified osteoarthritis, unspecified site: Secondary | ICD-10-CM | POA: Diagnosis present

## 2015-01-01 DIAGNOSIS — Z7982 Long term (current) use of aspirin: Secondary | ICD-10-CM | POA: Diagnosis not present

## 2015-01-01 HISTORY — DX: Other specified postprocedural states: R11.2

## 2015-01-01 HISTORY — PX: LAPAROSCOPIC SIGMOID COLECTOMY: SHX5928

## 2015-01-01 HISTORY — DX: Other specified postprocedural states: Z98.890

## 2015-01-01 LAB — CREATININE, SERUM
Creatinine, Ser: 0.78 mg/dL (ref 0.44–1.00)
GFR calc non Af Amer: >60 mL/min (ref >60)

## 2015-01-01 LAB — CBC
HEMATOCRIT: 36.8 % (ref 36.0–46.0)
Hemoglobin: 12.1 g/dL (ref 12.0–15.0)
MCH: 30.2 pg (ref 26.0–34.0)
MCHC: 32.9 g/dL (ref 30.0–36.0)
MCV: 91.8 fL (ref 78.0–100.0)
PLATELETS: 205 10*3/uL (ref 150–400)
RBC: 4.01 MIL/uL (ref 3.87–5.11)
RDW: 14.1 % (ref 11.5–15.5)
WBC: 9.2 10*3/uL (ref 4.0–10.5)

## 2015-01-01 SURGERY — COLECTOMY, SIGMOID, LAPAROSCOPIC
Anesthesia: General | Site: Abdomen

## 2015-01-01 MED ORDER — BUPIVACAINE ON-Q PAIN PUMP (FOR ORDER SET NO CHG)
INJECTION | Status: AC
  Filled 2015-01-01: qty 1

## 2015-01-01 MED ORDER — LIDOCAINE HCL (PF) 1 % IJ SOLN
INTRAMUSCULAR | Status: AC
  Filled 2015-01-01: qty 30

## 2015-01-01 MED ORDER — VECURONIUM BROMIDE 10 MG IV SOLR
INTRAVENOUS | Status: AC
  Filled 2015-01-01: qty 10

## 2015-01-01 MED ORDER — ONDANSETRON HCL 4 MG/2ML IJ SOLN
INTRAMUSCULAR | Status: AC
  Filled 2015-01-01: qty 2

## 2015-01-01 MED ORDER — PHENOL 1.4 % MT LIQD
1.0000 | OROMUCOSAL | Status: DC | PRN
  Filled 2015-01-01: qty 177

## 2015-01-01 MED ORDER — KCL IN DEXTROSE-NACL 20-5-0.45 MEQ/L-%-% IV SOLN
INTRAVENOUS | Status: DC
  Administered 2015-01-01: 100 mL/h via INTRAVENOUS
  Administered 2015-01-02 – 2015-01-03 (×3): via INTRAVENOUS
  Filled 2015-01-01 (×7): qty 1000

## 2015-01-01 MED ORDER — KETOROLAC TROMETHAMINE 15 MG/ML IJ SOLN
15.0000 mg | Freq: Four times a day (QID) | INTRAMUSCULAR | Status: AC
  Administered 2015-01-01 – 2015-01-03 (×7): 15 mg via INTRAVENOUS
  Filled 2015-01-01 (×7): qty 1

## 2015-01-01 MED ORDER — ACETAMINOPHEN 500 MG PO TABS
1000.0000 mg | ORAL_TABLET | Freq: Four times a day (QID) | ORAL | Status: AC
  Filled 2015-01-01: qty 2

## 2015-01-01 MED ORDER — STERILE WATER FOR INJECTION IJ SOLN
INTRAMUSCULAR | Status: AC
  Filled 2015-01-01: qty 10

## 2015-01-01 MED ORDER — DIPHENHYDRAMINE HCL 50 MG/ML IJ SOLN
12.5000 mg | Freq: Four times a day (QID) | INTRAMUSCULAR | Status: DC | PRN
  Administered 2015-01-02: 12.5 mg via INTRAVENOUS
  Filled 2015-01-01: qty 1

## 2015-01-01 MED ORDER — DIPHENHYDRAMINE HCL 25 MG PO CAPS: 25.0000 mg | ORAL_CAPSULE | Freq: Four times a day (QID) | ORAL | Status: DC | PRN

## 2015-01-01 MED ORDER — GENTAMICIN SULFATE 40 MG/ML IJ SOLN
5.0000 mg/kg | INTRAVENOUS | Status: AC
  Administered 2015-01-01: 380 mg via INTRAVENOUS
  Filled 2015-01-01: qty 9.5

## 2015-01-01 MED ORDER — SCOPOLAMINE 1 MG/3DAYS TD PT72
1.0000 | MEDICATED_PATCH | TRANSDERMAL | Status: DC
  Administered 2015-01-01: 1 via TRANSDERMAL
  Filled 2015-01-01: qty 1

## 2015-01-01 MED ORDER — FENTANYL CITRATE (PF) 250 MCG/5ML IJ SOLN
INTRAMUSCULAR | Status: AC
  Filled 2015-01-01: qty 5

## 2015-01-01 MED ORDER — ALUM & MAG HYDROXIDE-SIMETH 200-200-20 MG/5ML PO SUSP: 30.0000 mL | Freq: Four times a day (QID) | ORAL | Status: DC | PRN

## 2015-01-01 MED ORDER — BUPIVACAINE 0.25 % ON-Q PUMP DUAL CATH 300 ML
300.0000 mL | INJECTION | Status: DC
  Administered 2015-01-01: 300 mL
  Filled 2015-01-01: qty 300

## 2015-01-01 MED ORDER — NEOSTIGMINE METHYLSULFATE 10 MG/10ML IV SOLN
INTRAVENOUS | Status: AC
  Filled 2015-01-01: qty 1

## 2015-01-01 MED ORDER — ROCURONIUM BROMIDE 50 MG/5ML IV SOLN
INTRAVENOUS | Status: AC
  Filled 2015-01-01: qty 1

## 2015-01-01 MED ORDER — CLINDAMYCIN PHOSPHATE 900 MG/50ML IV SOLN
900.0000 mg | INTRAVENOUS | Status: AC
  Administered 2015-01-01: 900 mg via INTRAVENOUS
  Filled 2015-01-01: qty 50

## 2015-01-01 MED ORDER — SODIUM CHLORIDE 0.9 % IJ SOLN: 9.0000 mL | INTRAMUSCULAR | Status: DC | PRN

## 2015-01-01 MED ORDER — MORPHINE SULFATE (PF) 1 MG/ML IV SOLN
INTRAVENOUS | Status: AC
  Administered 2015-01-01: 1 mg
  Filled 2015-01-01: qty 25

## 2015-01-01 MED ORDER — PROMETHAZINE HCL 25 MG/ML IJ SOLN
6.2500 mg | Freq: Four times a day (QID) | INTRAMUSCULAR | Status: DC | PRN
  Filled 2015-01-01: qty 1

## 2015-01-01 MED ORDER — MEPERIDINE HCL 25 MG/ML IJ SOLN: 6.2500 mg | INTRAMUSCULAR | Status: DC | PRN

## 2015-01-01 MED ORDER — VECURONIUM BROMIDE 10 MG IV SOLR
INTRAVENOUS | Status: DC | PRN
  Administered 2015-01-01: 1 mg via INTRAVENOUS

## 2015-01-01 MED ORDER — PROPOFOL 10 MG/ML IV BOLUS
INTRAVENOUS | Status: DC | PRN
  Administered 2015-01-01: 150 mg via INTRAVENOUS

## 2015-01-01 MED ORDER — HYDROCODONE-ACETAMINOPHEN 5-325 MG PO TABS
2.0000 | ORAL_TABLET | ORAL | Status: DC | PRN
  Administered 2015-01-03 – 2015-01-04 (×5): 2 via ORAL
  Filled 2015-01-01 (×5): qty 2

## 2015-01-01 MED ORDER — ROCURONIUM BROMIDE 100 MG/10ML IV SOLN
INTRAVENOUS | Status: DC | PRN
  Administered 2015-01-01: 50 mg via INTRAVENOUS

## 2015-01-01 MED ORDER — LIDOCAINE HCL 1 % IJ SOLN
INTRAMUSCULAR | Status: DC | PRN
  Administered 2015-01-01: 15 mL

## 2015-01-01 MED ORDER — ARTIFICIAL TEARS OP OINT
TOPICAL_OINTMENT | OPHTHALMIC | Status: AC
  Filled 2015-01-01: qty 3.5

## 2015-01-01 MED ORDER — CIPROFLOXACIN IN D5W 400 MG/200ML IV SOLN
400.0000 mg | Freq: Two times a day (BID) | INTRAVENOUS | Status: AC
  Administered 2015-01-01 – 2015-01-02 (×2): 400 mg via INTRAVENOUS
  Filled 2015-01-01 (×2): qty 200

## 2015-01-01 MED ORDER — PROPOFOL 10 MG/ML IV BOLUS
INTRAVENOUS | Status: AC
  Filled 2015-01-01: qty 20

## 2015-01-01 MED ORDER — GLYCOPYRROLATE 0.2 MG/ML IJ SOLN
INTRAMUSCULAR | Status: AC
  Filled 2015-01-01: qty 1

## 2015-01-01 MED ORDER — ARTIFICIAL TEARS OP OINT
TOPICAL_OINTMENT | OPHTHALMIC | Status: DC | PRN
  Administered 2015-01-01: 1 via OPHTHALMIC

## 2015-01-01 MED ORDER — LIDOCAINE HCL (CARDIAC) 20 MG/ML IV SOLN
INTRAVENOUS | Status: AC
  Filled 2015-01-01: qty 10

## 2015-01-01 MED ORDER — NEOSTIGMINE METHYLSULFATE 10 MG/10ML IV SOLN
INTRAVENOUS | Status: DC | PRN
  Administered 2015-01-01: 4 mg via INTRAVENOUS

## 2015-01-01 MED ORDER — ALVIMOPAN 12 MG PO CAPS
12.0000 mg | ORAL_CAPSULE | Freq: Once | ORAL | Status: AC
  Administered 2015-01-01: 12 mg via ORAL
  Filled 2015-01-01: qty 1

## 2015-01-01 MED ORDER — DEXAMETHASONE SODIUM PHOSPHATE 4 MG/ML IJ SOLN
INTRAMUSCULAR | Status: DC | PRN
  Administered 2015-01-01: 8 mg via INTRAVENOUS

## 2015-01-01 MED ORDER — EPHEDRINE SULFATE 50 MG/ML IJ SOLN
INTRAMUSCULAR | Status: AC
  Filled 2015-01-01: qty 1

## 2015-01-01 MED ORDER — ONDANSETRON HCL 4 MG/2ML IJ SOLN
INTRAMUSCULAR | Status: DC | PRN
  Administered 2015-01-01 (×2): 4 mg via INTRAVENOUS

## 2015-01-01 MED ORDER — EPHEDRINE SULFATE 50 MG/ML IJ SOLN
INTRAMUSCULAR | Status: DC | PRN
  Administered 2015-01-01 (×3): 5 mg via INTRAVENOUS

## 2015-01-01 MED ORDER — ENOXAPARIN SODIUM 40 MG/0.4ML ~~LOC~~ SOLN
40.0000 mg | SUBCUTANEOUS | Status: DC
  Administered 2015-01-02 – 2015-01-04 (×3): 40 mg via SUBCUTANEOUS
  Filled 2015-01-01 (×3): qty 0.4

## 2015-01-01 MED ORDER — MORPHINE SULFATE 2 MG/ML IJ SOLN: 1.0000 mg | INTRAMUSCULAR | Status: DC | PRN

## 2015-01-01 MED ORDER — FENTANYL CITRATE (PF) 100 MCG/2ML IJ SOLN
INTRAMUSCULAR | Status: DC | PRN
  Administered 2015-01-01 (×2): 50 ug via INTRAVENOUS
  Administered 2015-01-01: 100 ug via INTRAVENOUS
  Administered 2015-01-01: 50 ug via INTRAVENOUS

## 2015-01-01 MED ORDER — MIDAZOLAM HCL 5 MG/5ML IJ SOLN
INTRAMUSCULAR | Status: DC | PRN
  Administered 2015-01-01: 2 mg via INTRAVENOUS

## 2015-01-01 MED ORDER — LIDOCAINE HCL (CARDIAC) 20 MG/ML IV SOLN
INTRAVENOUS | Status: DC | PRN
  Administered 2015-01-01: 60 mg via INTRATRACHEAL
  Administered 2015-01-01: 100 mg via INTRAVENOUS

## 2015-01-01 MED ORDER — LACTATED RINGERS IV SOLN
INTRAVENOUS | Status: DC | PRN
  Administered 2015-01-01 (×2): via INTRAVENOUS

## 2015-01-01 MED ORDER — DIPHENHYDRAMINE HCL 12.5 MG/5ML PO ELIX: 12.5000 mg | ORAL_SOLUTION | Freq: Four times a day (QID) | ORAL | Status: DC | PRN

## 2015-01-01 MED ORDER — IPRATROPIUM BROMIDE 0.06 % NA SOLN
2.0000 | Freq: Four times a day (QID) | NASAL | Status: DC
  Filled 2015-01-01: qty 15

## 2015-01-01 MED ORDER — SCOPOLAMINE 1 MG/3DAYS TD PT72
MEDICATED_PATCH | TRANSDERMAL | Status: AC
Start: 2015-01-01 — End: 2015-01-04
  Administered 2015-01-01: 1.5 mg via TRANSDERMAL
  Filled 2015-01-01: qty 1

## 2015-01-01 MED ORDER — SODIUM CHLORIDE 0.9 % IR SOLN
Status: DC | PRN
  Administered 2015-01-01: 1000 mL

## 2015-01-01 MED ORDER — NALOXONE HCL 0.4 MG/ML IJ SOLN: 0.4000 mg | INTRAMUSCULAR | Status: DC | PRN

## 2015-01-01 MED ORDER — KCL IN DEXTROSE-NACL 20-5-0.45 MEQ/L-%-% IV SOLN
INTRAVENOUS | Status: AC
  Filled 2015-01-01: qty 1000

## 2015-01-01 MED ORDER — MORPHINE SULFATE (PF) 1 MG/ML IV SOLN
INTRAVENOUS | Status: DC
  Administered 2015-01-01 (×2): 7.5 mg via INTRAVENOUS
  Administered 2015-01-02: 4.5 mg via INTRAVENOUS
  Administered 2015-01-02: 4 mg via INTRAVENOUS
  Administered 2015-01-02: 4.16 mg via INTRAVENOUS
  Administered 2015-01-02: 10.5 mg via INTRAVENOUS
  Filled 2015-01-01: qty 25

## 2015-01-01 MED ORDER — BUPIVACAINE-EPINEPHRINE (PF) 0.25% -1:200000 IJ SOLN
INTRAMUSCULAR | Status: AC
  Filled 2015-01-01: qty 30

## 2015-01-01 MED ORDER — ALVIMOPAN 12 MG PO CAPS
12.0000 mg | ORAL_CAPSULE | Freq: Two times a day (BID) | ORAL | Status: DC
  Administered 2015-01-02 – 2015-01-03 (×3): 12 mg via ORAL
  Filled 2015-01-01 (×3): qty 1

## 2015-01-01 MED ORDER — HYDROMORPHONE HCL 1 MG/ML IJ SOLN: 0.2500 mg | INTRAMUSCULAR | Status: DC | PRN

## 2015-01-01 MED ORDER — METRONIDAZOLE IN NACL 5-0.79 MG/ML-% IV SOLN
500.0000 mg | Freq: Three times a day (TID) | INTRAVENOUS | Status: AC
  Administered 2015-01-01 (×2): 500 mg via INTRAVENOUS
  Filled 2015-01-01 (×2): qty 100

## 2015-01-01 MED ORDER — GLYCOPYRROLATE 0.2 MG/ML IJ SOLN
INTRAMUSCULAR | Status: AC
  Filled 2015-01-01: qty 3

## 2015-01-01 MED ORDER — NITROFURANTOIN MONOHYD MACRO 100 MG PO CAPS: 100.0000 mg | ORAL_CAPSULE | Freq: Two times a day (BID) | ORAL | Status: DC

## 2015-01-01 MED ORDER — DEXAMETHASONE SODIUM PHOSPHATE 4 MG/ML IJ SOLN
INTRAMUSCULAR | Status: AC
  Filled 2015-01-01: qty 2

## 2015-01-01 MED ORDER — ONDANSETRON HCL 4 MG/2ML IJ SOLN
4.0000 mg | Freq: Four times a day (QID) | INTRAMUSCULAR | Status: DC | PRN
  Administered 2015-01-01: 4 mg via INTRAVENOUS
  Filled 2015-01-01: qty 2

## 2015-01-01 MED ORDER — 0.9 % SODIUM CHLORIDE (POUR BTL) OPTIME
TOPICAL | Status: DC | PRN
  Administered 2015-01-01 (×2): 1000 mL

## 2015-01-01 MED ORDER — ONDANSETRON HCL 4 MG/2ML IJ SOLN: 4.0000 mg | Freq: Once | INTRAMUSCULAR | Status: DC | PRN

## 2015-01-01 MED ORDER — GLYCOPYRROLATE 0.2 MG/ML IJ SOLN
INTRAMUSCULAR | Status: DC | PRN
  Administered 2015-01-01: 0.6 mg via INTRAVENOUS

## 2015-01-01 MED ORDER — VALACYCLOVIR HCL 500 MG PO TABS
1000.0000 mg | ORAL_TABLET | Freq: Every day | ORAL | Status: DC | PRN
  Filled 2015-01-01: qty 2

## 2015-01-01 MED ORDER — FLUTICASONE PROPIONATE 50 MCG/ACT NA SUSP
2.0000 | Freq: Every day | NASAL | Status: DC
  Filled 2015-01-01: qty 16

## 2015-01-01 MED ORDER — DIPHENHYDRAMINE HCL 50 MG/ML IJ SOLN: 25.0000 mg | Freq: Four times a day (QID) | INTRAMUSCULAR | Status: DC | PRN

## 2015-01-01 MED ORDER — SUCCINYLCHOLINE CHLORIDE 20 MG/ML IJ SOLN
INTRAMUSCULAR | Status: AC
  Filled 2015-01-01: qty 1

## 2015-01-01 MED ORDER — MIDAZOLAM HCL 2 MG/2ML IJ SOLN
INTRAMUSCULAR | Status: AC
  Filled 2015-01-01: qty 2

## 2015-01-01 SURGICAL SUPPLY — 87 items
BLADE 11 SAFETY STRL DISP (BLADE) ×2 IMPLANT
CANISTER SUCTION 2500CC (MISCELLANEOUS) ×3 IMPLANT
CATH KIT ON-Q SILVERSOAK 5 (CATHETERS) IMPLANT
CATH KIT ON-Q SILVERSOAK 5IN (CATHETERS) ×6 IMPLANT
CELLS DAT CNTRL 66122 CELL SVR (MISCELLANEOUS) IMPLANT
CHLORAPREP W/TINT 26ML (MISCELLANEOUS) ×3 IMPLANT
CLOSURE WOUND 1/2 X4 (GAUZE/BANDAGES/DRESSINGS) ×1
COVER MAYO STAND STRL (DRAPES) ×6 IMPLANT
COVER SURGICAL LIGHT HANDLE (MISCELLANEOUS) ×6 IMPLANT
DRAPE LAPAROSCOPIC ABDOMINAL (DRAPES) ×3 IMPLANT
DRAPE PROXIMA HALF (DRAPES) ×3 IMPLANT
DRAPE UTILITY XL STRL (DRAPES) ×15 IMPLANT
DRAPE WARM FLUID 44X44 (DRAPE) ×3 IMPLANT
DRSG OPSITE POSTOP 4X10 (GAUZE/BANDAGES/DRESSINGS) IMPLANT
DRSG OPSITE POSTOP 4X6 (GAUZE/BANDAGES/DRESSINGS) ×2 IMPLANT
DRSG OPSITE POSTOP 4X8 (GAUZE/BANDAGES/DRESSINGS) IMPLANT
ELECT BLADE 6.5 EXT (BLADE) ×3 IMPLANT
ELECT CAUTERY BLADE 6.4 (BLADE) ×6 IMPLANT
ELECT REM PT RETURN 9FT ADLT (ELECTROSURGICAL) ×3
ELECTRODE REM PT RTRN 9FT ADLT (ELECTROSURGICAL) ×1 IMPLANT
GEL ULTRASOUND 20GR AQUASONIC (MISCELLANEOUS) IMPLANT
GLOVE BIO SURGEON STRL SZ 6 (GLOVE) ×6 IMPLANT
GLOVE BIO SURGEON STRL SZ7 (GLOVE) ×2 IMPLANT
GLOVE BIOGEL PI IND STRL 6.5 (GLOVE) ×2 IMPLANT
GLOVE BIOGEL PI IND STRL 7.0 (GLOVE) IMPLANT
GLOVE BIOGEL PI IND STRL 7.5 (GLOVE) IMPLANT
GLOVE BIOGEL PI INDICATOR 6.5 (GLOVE) ×8
GLOVE BIOGEL PI INDICATOR 7.0 (GLOVE) ×4
GLOVE BIOGEL PI INDICATOR 7.5 (GLOVE) ×6
GLOVE ECLIPSE 7.5 STRL STRAW (GLOVE) ×6 IMPLANT
GOWN STRL REUS W/ TWL LRG LVL3 (GOWN DISPOSABLE) ×6 IMPLANT
GOWN STRL REUS W/TWL 2XL LVL3 (GOWN DISPOSABLE) ×6 IMPLANT
GOWN STRL REUS W/TWL LRG LVL3 (GOWN DISPOSABLE) ×18
KIT BASIN OR (CUSTOM PROCEDURE TRAY) ×3 IMPLANT
KIT ROOM TURNOVER OR (KITS) ×3 IMPLANT
LEGGING LITHOTOMY PAIR STRL (DRAPES) ×3 IMPLANT
LIGASURE 5MM LAPAROSCOPIC (INSTRUMENTS) IMPLANT
LIGASURE IMPACT 36 18CM CVD LR (INSTRUMENTS) IMPLANT
LIQUID BAND (GAUZE/BANDAGES/DRESSINGS) ×2 IMPLANT
NS IRRIG 1000ML POUR BTL (IV SOLUTION) ×6 IMPLANT
PAD ARMBOARD 7.5X6 YLW CONV (MISCELLANEOUS) ×6 IMPLANT
PENCIL BUTTON HOLSTER BLD 10FT (ELECTRODE) ×6 IMPLANT
RETRACTOR WND ALEXIS 18 MED (MISCELLANEOUS) IMPLANT
RTRCTR WOUND ALEXIS 18CM MED (MISCELLANEOUS)
SCALPEL HARMONIC ACE (MISCELLANEOUS) IMPLANT
SCISSORS LAP 5X35 DISP (ENDOMECHANICALS) ×3 IMPLANT
SEALER TISSUE G2 STRG ARTC 35C (ENDOMECHANICALS) ×2 IMPLANT
SET IRRIG TUBING LAPAROSCOPIC (IRRIGATION / IRRIGATOR) IMPLANT
SLEEVE ENDOPATH XCEL 5M (ENDOMECHANICALS) ×9 IMPLANT
SLEEVE SURGEON STRL (DRAPES) ×4 IMPLANT
SPECIMEN JAR LARGE (MISCELLANEOUS) ×3 IMPLANT
SPONGE LAP 18X18 X RAY DECT (DISPOSABLE) ×2 IMPLANT
STAPLER CIRC ILS CVD 33MM 37CM (STAPLE) ×2 IMPLANT
STAPLER CUT CVD 40MM BLUE (STAPLE) ×2 IMPLANT
STAPLER VISISTAT 35W (STAPLE) ×3 IMPLANT
STRIP CLOSURE SKIN 1/2X4 (GAUZE/BANDAGES/DRESSINGS) ×1 IMPLANT
SUCTION POOLE TIP (SUCTIONS) ×3 IMPLANT
SURGILUBE 2OZ TUBE FLIPTOP (MISCELLANEOUS) ×3 IMPLANT
SUT MNCRL AB 4-0 PS2 18 (SUTURE) ×4 IMPLANT
SUT PDS AB 1 CT  36 (SUTURE)
SUT PDS AB 1 CT 36 (SUTURE) IMPLANT
SUT PDS II 0 TP-1 LOOPED 60 (SUTURE) ×4 IMPLANT
SUT PROLENE 2 0 CT2 30 (SUTURE) IMPLANT
SUT PROLENE 2 0 KS (SUTURE) IMPLANT
SUT VIC AB 2-0 SH 18 (SUTURE) ×3 IMPLANT
SUT VIC AB 3-0 SH 18 (SUTURE) ×3 IMPLANT
SUT VIC AB 3-0 SH 27 (SUTURE) ×6
SUT VIC AB 3-0 SH 27X BRD (SUTURE) IMPLANT
SUT VICRYL AB 2 0 TIES (SUTURE) ×3 IMPLANT
SUT VICRYL AB 3 0 TIES (SUTURE) ×3 IMPLANT
SYR BULB IRRIGATION 50ML (SYRINGE) ×3 IMPLANT
SYS LAPSCP GELPORT 120MM (MISCELLANEOUS) ×3
SYSTEM LAPSCP GELPORT 120MM (MISCELLANEOUS) IMPLANT
TOWEL OR 17X26 10 PK STRL BLUE (TOWEL DISPOSABLE) ×6 IMPLANT
TRAY FOLEY CATH 16FRSI W/METER (SET/KITS/TRAYS/PACK) ×3 IMPLANT
TRAY LAPAROSCOPIC (CUSTOM PROCEDURE TRAY) ×3 IMPLANT
TRAY PROCTOSCOPIC FIBER OPTIC (SET/KITS/TRAYS/PACK) ×3 IMPLANT
TROCAR XCEL 12X100 BLDLESS (ENDOMECHANICALS) IMPLANT
TROCAR XCEL BLUNT TIP 100MML (ENDOMECHANICALS) IMPLANT
TROCAR XCEL NON-BLD 11X100MML (ENDOMECHANICALS) IMPLANT
TROCAR XCEL NON-BLD 5MMX100MML (ENDOMECHANICALS) ×3 IMPLANT
TUBE CONNECTING 12'X1/4 (SUCTIONS) ×2
TUBE CONNECTING 12X1/4 (SUCTIONS) ×4 IMPLANT
TUBING FILTER THERMOFLATOR (ELECTROSURGICAL) ×3 IMPLANT
TUBING INSUFFLATION (TUBING) ×3 IMPLANT
TUNNELER SHEATH ON-Q 16GX12 DP (PAIN MANAGEMENT) ×2 IMPLANT
YANKAUER SUCT BULB TIP NO VENT (SUCTIONS) ×6 IMPLANT

## 2015-01-01 NOTE — Anesthesia Postprocedure Evaluation (Signed)
Anesthesia Post Note  Patient: Kari [name]  Procedure(s) Performed: Procedure(s) (LRB): LAPAROSCOPIC SIGMOID COLECTOMY (N/A)  Anesthesia type: general  Patient location: PACU  Post pain: Pain level controlled  Post assessment: Patient's Cardiovascular Status Stable  Last Vitals:  Filed Vitals:   01/01/15 1203  BP: 103/54  Pulse: 52  Temp: 36.6 C  Resp: 17    Post vital signs: Reviewed and stable  Level of consciousness: sedated  Complications: No apparent anesthesia complications

## 2015-01-01 NOTE — Anesthesia Procedure Notes (Signed)
Procedure Name: Intubation Date/Time: 01/01/2015 7:38 AM Performed by: [place] Pink Pre-anesthesia Checklist: Patient identified, Emergency Drugs available, Suction available, Patient being monitored and Timeout performed Patient Re-evaluated:Patient Re-evaluated prior to inductionOxygen Delivery Method: Circle system utilized Preoxygenation: Pre-oxygenation with 100% oxygen Intubation Type: IV induction Ventilation: Mask ventilation without difficulty and Oral airway inserted - appropriate to patient size Laryngoscope Size: Mac and 3 Grade View: Grade II Tube type: Oral Tube size: 7.0 mm Number of attempts: 1 Airway Equipment and Method: Stylet and LTA kit utilized Placement Confirmation: ETT inserted through vocal cords under direct vision,  positive ETCO2 and breath sounds checked- equal and bilateral Secured at: 22 cm Tube secured with: Tape Dental Injury: Teeth and Oropharynx as per pre-operative assessment

## 2015-01-01 NOTE — Op Note (Signed)
Laparoscopic Sigmoid Colectomy, takedown of splenic flexure  Indications: This patient presents for a laparoscopic partial colectomy for recurrent diverticulitis  Pre-operative Diagnosis:  Recurrent diverticulitis  Post-operative Diagnosis: Same  Surgeon: Stark Klein   Assistants: Judyann Munson, RNFA Ariel Hilsinger, PA-S  Anesthesia: General endotracheal anesthesia and Local anesthesia 1% plain lidocaine, 0.25.% bupivacaine, with epinephrine  ASA Class: 2  Procedure Details  The patient was seen in the Holding Room. The risks, benefits, complications, treatment options, and expected outcomes were discussed with the patient. The possibilities of reaction to medication, perforation of viscus, bleeding, recurrent infection, finding a normal colon, the need for additional procedures, failure to diagnose a condition, and creating a complication requiring transfusion or operation were discussed with the patient. The patient was advised of the risk of ostomy.  The patient concurred with the proposed plan, giving informed consent.   The patient was taken to the operating room, identified, and the procedure verified as partial colectomy. A Time Out was held and the above information confirmed.  The patient was brought to the operating room and placed supine. After induction of a general anesthetic, a Foley catheter was inserted and the abdomen was prepped and draped in standard fashion. Local anesthetic was infiltrated at the left costal margin.  A 5 mm Optiview port was placed under direct visualization.   Pneumoperitoneum was insufflated to a pressure of 15 mm Hg. The laparoscope was introduced.    Exploration revealed a normal omentum, colon, small bowel, peritoneum, liver, and stomach. Two right lateral 5-mm trocars were then placed after anesthetizing the skin and peritoneum with Marcaine. A fourth 5 mm port was placed in the upper right quadrant.  The sigmoid was identified and there was  mild chronic inflammation present.  The sigmoid was mobilized with the scissors and the EnSeal away from the left lateral abdominal wall.  The descending colon and splenic flexure were then mobilized with gentle retraction of the colon in a medial direction with mobilization of the peritoneal reflection with cautery and the EnSeal.  The sigmoid was fairly redundant and the lower portion was where the inflammation was.  A 6.5 cm handport was placed in the lower midline.  Digital retraction was used to complete the mobilization of the sigmoid and the splenic flexure.  The ureter was identified during this mobilization process and avoided. The EnSeal was used to divide the mesentery higher up near the colon wall in order to avoid the ureter as well.        Once the left colon and sigmoid was completely mobilized, the outer portion of the hand port was removed.   The colon was resected with the blue load of the contour stapler at the proximal rectum where the taenia flared out.  The proximal site of the division was around 1/2 way up the descending colon.  There were still some small diverticuli going all the way up to the mid transverse colon, but they were dramatically smaller at that transition point. The bowel was divided with the cautery at this location.   The specimen was submitted to pathology.     The sizers confirmed that the 33 mm EEA would be acceptable.  The anvil of the stapler was placed in the end of the colon and secured with a 0-0 Prolene pursestring suture.  I went to the rectal position and advanced the stapler through the anus.  The spike was deployed through the proximal anterior rectum after double checking the vagina and posterior wall of  the vagina was not involved in the stapler.  The anvil was coupled with the stapler.  The adjacent tissues were held out of the way while the stapler was closed.  Pressure was held, then the stapler was fired.  The stapler was gently removed, and the  anastamotic rings were intact.  The proctoscope was used to examine the staple line, and no bleeding was seen.  The rectum was insufflated, and there were no bubbles seen coming up into the irrigant in the abdomen.   The towels, bovie, gelport, and suction was removed.  New drapes were placed around the incision. The omentum was pulled down under the incision.  The peritoneum of the hand port was closed with the 0-0 vicryl in running fashion.  The OnQ tunneler was used to place the catheters in a preperitoneal location.  The fascia was closed with 0-0 looped PDS suture.    The soft tissue was irrigated, and the midline was closed with interrupted 3-0 vicryl sutures.  All the incisions were then closed with a 4-0 monocryl subcuticular sutures.    Instrument, sponge, and needle counts were correct prior to abdominal closure and at the conclusion of the case.   Findings: chronic inflammation , diverticuli going up around halfway of descending colon  Estimated Blood Loss: less than 50 mL         Drains: none          Specimens: rectosigmoid            Complications: None; patient tolerated the procedure well.         Disposition: PACU - hemodynamically stable.         Condition: stable

## 2015-01-01 NOTE — Anesthesia Preprocedure Evaluation (Addendum)
Anesthesia Evaluation  Patient identified by MRN, date of birth, ID band Patient awake    Reviewed: Allergy & Precautions, NPO status , Patient's Chart, lab work & pertinent test results  History of Anesthesia Complications (+) PONV  Airway Mallampati: I  TM Distance: >3 FB Neck ROM: Full    Dental   Pulmonary    Pulmonary exam normal       Cardiovascular Normal cardiovascular exam    Neuro/Psych    GI/Hepatic   Endo/Other    Renal/GU      Musculoskeletal   Abdominal   Peds  Hematology   Anesthesia Other Findings   Reproductive/Obstetrics                            Anesthesia Physical Anesthesia Plan  ASA: II  Anesthesia Plan: General   Post-op Pain Management:    Induction: Intravenous  Airway Management Planned: Oral ETT  Additional Equipment:   Intra-op Plan:   Post-operative Plan: Extubation in OR  Informed Consent: I have reviewed the patients History and Physical, chart, labs and discussed the procedure including the risks, benefits and alternatives for the proposed anesthesia with the patient or authorized representative who has indicated his/her understanding and acceptance.     Plan Discussed with: CRNA and Surgeon  Anesthesia Plan Comments: (Scopolomine patch for PONV)       Anesthesia Quick Evaluation

## 2015-01-01 NOTE — Transfer of Care (Signed)
Immediate Anesthesia Transfer of Care Note  Patient: Kari [name]  Procedure(s) Performed: Procedure(s): LAPAROSCOPIC SIGMOID COLECTOMY (N/A)  Patient Location: PACU  Anesthesia Type:General  Level of Consciousness: sedated  Airway & Oxygen Therapy: Patient Spontanous Breathing and Patient connected to nasal cannula oxygen  Post-op Assessment: Report given to RN and Post -op Vital signs reviewed and stable  Post vital signs: Reviewed and stable  Last Vitals:  Filed Vitals:   01/01/15 0616  BP: 141/65  Pulse: 68  Temp: 36.2 C  Resp: 18    Complications: No apparent anesthesia complications

## 2015-01-01 NOTE — Care Management Note (Signed)
Case Management Note  Patient Details  Name: Kari [name] MRN: [id number] Date of Birth: 03-16-1951  Subjective/Objective:                    Action/Plan:   Expected Discharge Date: 01-04-15   UR completed.                  Expected Discharge Plan:  Home/Self Care  In-House Referral:     Discharge planning Services     Post Acute Care Choice:    Choice offered to:     DME Arranged:    DME Agency:     HH Arranged:    HH Agency:     Status of Service:  In process, will continue to follow  Medicare Important Message Given:    Date Medicare IM Given:    Medicare IM give by:    Date Additional Medicare IM Given:    Additional Medicare Important Message give by:     If discussed at [place] of Stay Meetings, dates discussed:    Additional Comments:  Marilu Favre, RN 01/01/2015, 2:28 PM

## 2015-01-01 NOTE — Progress Notes (Signed)
Arrived to 6n01 from PACU, oriented to room and surroundings, denies nausea, c/o abd pain 5/10. Encouraged to use PCA, friend at bedside

## 2015-01-01 NOTE — H&P (Signed)
Kari [name] is an 64 y.o. female.   Chief Complaint: recalcitrant diverticulitis.   HPI:  Pt has had diverticulitis on and off for several months.  She was hoping not to need surgery, but she is still having pain on antibiotics.    Past Medical History  Diagnosis Date  . Herpes zoster conjunctivitis   . Menopause   . Seasonal allergies     "spring & fall; not q year" (02/17/2013)  . Mobitz type 2 second degree AV block     2:1/notes 02/16/2013  . Presence of permanent cardiac pacemaker   . Pneumonia 6 1/34yrs ago  . History of bronchitis Feb 2016  . Joint pain   . Chronic back pain   . Diverticulitis   . Arthritis   . PONV (postoperative nausea and vomiting)     Past Surgical History  Procedure Laterality Date  . Arthrogram knee Right 1975  . Insert / replace / remove pacemaker  02/17/2013    [facility] [employer]io dual-chamber pacemaker, model [id number],  . Tonsillectomy  1961  . Cholecystectomy  1990's  . Knee arthroscopy Right 1982, [place]  . Knee arthroscopy w/ acl reconstruction Left 1984  . 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 Trigger finger release Right ~ 2012    "thumb" (02/17/2013)  . Permanent pacemaker insertion N/A 02/17/2013    Procedure: PERMANENT PACEMAKER INSERTION;  Surgeon: Evans Lance, MD;  Location: [facility] CATH LAB;  Service: Cardiovascular;  Laterality: N/A;  . Achilles tendon surgery    . Vaginal hysterectomy  ~ 1994    partial   . Colonscopy    . Esophagogastroduodenoscopy      Family History  Problem Relation Age of Onset  . Hypertension Mother   . Hyperlipidemia Mother   . Macular degeneration Mother   . COPD Mother   . Lung cancer Father   . Cancer Father     Lung  . COPD Father   . Diabetes Brother   . Breast cancer Paternal Aunt    Social History:  reports that she has never smoked. She has never used smokeless tobacco. She reports that she does not drink  alcohol or use illicit drugs.  Allergies:  Allergies  Allergen Reactions  . Ceftin Hives    Tolerates amoxicillin  . Levofloxacin Other (See Comments)    "spacey," tolerates Cipro  . Vancomycin Hives    Medications Prior to Admission  Medication Sig Dispense Refill  . aspirin EC 81 MG tablet Take 81 mg by mouth daily.    . cholecalciferol (VITAMIN D) 1000 UNITS tablet Take 1,000 Units by mouth daily.    . Cyanocobalamin (VITAMIN B-12) 2500 MCG SUBL Place 2,500 mcg under the tongue daily.    . fluticasone (FLONASE) 50 MCG/ACT nasal spray Place into both nostrils daily.    Marland Kitchen HYDROcodone-acetaminophen (NORCO/VICODIN) 5-325 MG per tablet Take 2 tablets by mouth every 4 (four) hours as needed for moderate pain. 30 tablet 0  . Multiple Vitamin (MULTIVITAMIN WITH MINERALS) TABS Take 1 tablet by mouth daily.    . Omega-3 Fatty Acids (EQL OMEGA 3 FISH OIL) 1200 MG CAPS Take 3,600 mg by mouth daily.    . Probiotic Product (PROBIOTIC FORMULA PO) Take 2 capsules by mouth every morning.     . valACYclovir (VALTREX) 1000 MG tablet Take 1,000 mg by mouth daily as needed (herpes zoster).     Marland Kitchen azithromycin (ZITHROMAX) 250 MG tablet  Take 1 tablet (250 mg total) by mouth daily. Take first 2 tablets together, then 1 every day until finished. (Patient not taking: Reported on 12/27/2014) 6 tablet 0  . cephALEXin (KEFLEX) 500 MG capsule Take 1 capsule (500 mg total) by mouth 4 (four) times daily. (Patient not taking: Reported on 12/27/2014) 28 capsule 0  . ciprofloxacin (CIPRO) 500 MG tablet Take 1 tablet (500 mg total) by mouth 2 (two) times daily. (Patient not taking: Reported on 12/27/2014) 60 tablet 0  . ipratropium (ATROVENT) 0.06 % nasal spray Place 2 sprays into both nostrils 4 (four) times daily. (Patient not taking: Reported on 12/27/2014) 15 mL 1  . metroNIDAZOLE (FLAGYL) 500 MG tablet Take 1 tablet (500 mg total) by mouth 3 (three) times daily. (Patient not taking: Reported on 12/27/2014) 90 tablet 0  .  nitrofurantoin, macrocrystal-monohydrate, (MACROBID) 100 MG capsule Take 1 capsule (100 mg total) by mouth 2 (two) times daily. (Patient not taking: Reported on 12/27/2014) 14 capsule 0    No results found for this or any previous visit (from the past 48 hour(s)). No results found.  Review of Systems  Gastrointestinal: Positive for abdominal pain and diarrhea.    Blood pressure 141/65, pulse 68, temperature 97.1 F (36.2 C), temperature source Oral, resp. rate 18, height 5\' 8"  (1.727 m), weight 75.892 kg (167 lb 5 oz), SpO2 99 %. Physical Exam  Constitutional: She is oriented to person, place, and time. She appears well-developed and well-nourished. No distress.  HENT:  Head: Normocephalic and atraumatic.  Respiratory: Effort normal. No respiratory distress.  GI: Soft. She exhibits no distension. There is tenderness (LLQ).  Neurological: She is alert and oriented to person, place, and time.  Skin: Skin is warm and dry. She is not diaphoretic.  Psychiatric: She has a normal mood and affect. Her behavior is normal. Judgment and thought content normal.     Assessment/Plan Diverticulitis. Plan lap/open sigmoid resection. Reviewed risks and benefits.   May need ostomy or open given continued inflammation.    Kari [name] 01/01/2015, 7:22 AM

## 2015-01-01 NOTE — Discharge Instructions (Signed)
[facility], [name] [phone]  ABDOMINAL SURGERY: POST OP INSTRUCTIONS  Always review your discharge instruction sheet given to you by the facility where your surgery was performed.  IF YOU HAVE DISABILITY OR FAMILY LEAVE FORMS, YOU MUST BRING THEM TO THE OFFICE FOR PROCESSING.  PLEASE DO NOT GIVE THEM TO YOUR DOCTOR.  1. A prescription for pain medication may be given to you upon discharge.  Take your pain medication as prescribed, if needed.  If narcotic pain medicine is not needed, then you may take acetaminophen (Tylenol) or ibuprofen (Advil) as needed. 2. Take your usually prescribed medications unless otherwise directed. 3. If you need a refill on your pain medication, please contact your pharmacy. They will contact our office to request authorization.  Prescriptions will not be filled after 5pm or on week-ends. 4. You should follow a light diet the first few days after arrival home, such as soup and crackers, pudding, etc.unless your doctor has advised otherwise. A high-fiber, low fat diet can be resumed as tolerated.   Be sure to include lots of fluids daily. Most patients will experience some swelling and bruising on the chest and neck area.  Ice packs will help.  Swelling and bruising can take several days to resolve 5. Most patients will experience some swelling and bruising in the area of the incision. Ice pack will help. Swelling and bruising can take several days to resolve..  6. It is common to experience some constipation if taking pain medication after surgery.  Increasing fluid intake and taking a stool softener will usually help or prevent this problem from occurring.  A mild laxative (Milk of Magnesia or Miralax) should be taken according to package directions if there are no bowel movements after 48 hours. 7.  You may have steri-strips (small skin tapes) in place directly over the incision.  These strips should be left on the skin for 10-14 days.  If your  surgeon used skin glue on the incision, you may shower in 48 hours.  The glue will flake off over the next 2-3 weeks.  Any sutures or staples will be removed at the office during your follow-up visit. You may find that a light gauze bandage over your incision may keep your staples from being rubbed or pulled. You may shower and replace the bandage daily. 8. ACTIVITIES:  You may resume regular (light) daily activities beginning the next day--such as daily self-care, walking, climbing stairs--gradually increasing activities as tolerated.  You may have sexual intercourse when it is comfortable.  Refrain from any heavy lifting or straining until approved by your doctor. a. You may drive when you no longer are taking prescription pain medication, you can comfortably wear a seatbelt, and you can safely maneuver your car and apply brakes b. Return to Work: __________6 weeks if applicable_________________________ 9. You should see your doctor in the office for a follow-up appointment approximately two weeks after your surgery.  Make sure that you call for this appointment within a day or two after you arrive home to insure a convenient appointment time. OTHER INSTRUCTIONS:  _____________________________________________________________ _____________________________________________________________  WHEN TO CALL YOUR DOCTOR: 1. Fever over 101.0 2. Inability to urinate 3. Nausea and/or vomiting 4. Extreme swelling or bruising 5. Continued bleeding from incision. 6. Increased pain, redness, or drainage from the incision. 7. Difficulty swallowing or breathing 8. Muscle cramping or spasms. 9. Numbness or tingling in hands or feet or around lips.  The clinic staff is  available to answer your questions during regular business hours.  Please don’t hesitate to call and ask to speak to one of the nurses if you have concerns. ° °For further questions, please visit [email or web address] ° ° ° °

## 2015-01-02 ENCOUNTER — Encounter (HOSPITAL_COMMUNITY): Payer: Self-pay | Admitting: General Surgery

## 2015-01-02 LAB — BASIC METABOLIC PANEL
Anion gap: 9 (ref 5–15)
BUN: <5 mg/dL — ABNORMAL LOW (ref 6–20)
CO2: 26 mmol/L (ref 22–32)
Calcium: 8.5 mg/dL — ABNORMAL LOW (ref 8.9–10.3)
Chloride: 102 mmol/L (ref 101–111)
Creatinine, Ser: 0.78 mg/dL (ref 0.44–1.00)
GFR calc non Af Amer: >60 mL/min (ref >60)
GLUCOSE: 108 mg/dL — AB (ref 70–99)
POTASSIUM: 3.7 mmol/L (ref 3.5–5.1)
Sodium: 137 mmol/L (ref 135–145)

## 2015-01-02 LAB — CBC
HCT: 34.3 % — ABNORMAL LOW (ref 36.0–46.0)
Hemoglobin: 11.3 g/dL — ABNORMAL LOW (ref 12.0–15.0)
MCH: 30.8 pg (ref 26.0–34.0)
MCHC: 32.9 g/dL (ref 30.0–36.0)
MCV: 93.5 fL (ref 78.0–100.0)
PLATELETS: 212 10*3/uL (ref 150–400)
RBC: 3.67 MIL/uL — ABNORMAL LOW (ref 3.87–5.11)
RDW: 14.7 % (ref 11.5–15.5)
WBC: 8.5 10*3/uL (ref 4.0–10.5)

## 2015-01-02 LAB — PHOSPHORUS: PHOSPHORUS: 3.7 mg/dL (ref 2.5–4.6)

## 2015-01-02 LAB — MAGNESIUM: MAGNESIUM: 1.6 mg/dL — AB (ref 1.7–2.4)

## 2015-01-02 MED ORDER — HYDROMORPHONE 0.3 MG/ML IV SOLN
INTRAVENOUS | Status: DC
  Administered 2015-01-02: 16:00:00 via INTRAVENOUS
  Administered 2015-01-02: 0.6 mg via INTRAVENOUS
  Administered 2015-01-03: 1.2 mg via INTRAVENOUS
  Administered 2015-01-03: 3.66 mg via INTRAVENOUS
  Administered 2015-01-03: 2.1 mg via INTRAVENOUS
  Filled 2015-01-02: qty 25

## 2015-01-02 MED ORDER — SODIUM CHLORIDE 0.9 % IJ SOLN: 9.0000 mL | INTRAMUSCULAR | Status: DC | PRN

## 2015-01-02 MED ORDER — DIPHENHYDRAMINE HCL 12.5 MG/5ML PO ELIX: 12.5000 mg | ORAL_SOLUTION | Freq: Four times a day (QID) | ORAL | Status: DC | PRN

## 2015-01-02 MED ORDER — DIPHENHYDRAMINE HCL 50 MG/ML IJ SOLN
12.5000 mg | Freq: Four times a day (QID) | INTRAMUSCULAR | Status: DC | PRN
  Administered 2015-01-02: 12.5 mg via INTRAVENOUS
  Filled 2015-01-02: qty 1

## 2015-01-02 MED ORDER — NALOXONE HCL 0.4 MG/ML IJ SOLN: 0.4000 mg | INTRAMUSCULAR | Status: DC | PRN

## 2015-01-02 MED ORDER — ONDANSETRON HCL 4 MG/2ML IJ SOLN
4.0000 mg | Freq: Four times a day (QID) | INTRAMUSCULAR | Status: DC | PRN
Start: 2015-01-02 — End: 2015-01-03

## 2015-01-02 NOTE — Progress Notes (Signed)
1 Day Post-Op  Subjective: Pt doing well today and with a lot of questions. Pain tolerable with PCA, she state she didn't push her button for a while and is now trying to catch up. Pain is a 3/10 around her incision. Small amount of flatus, no BM. Tolerating clear liquids well, no N/V. She was happy that her foley was removed this morning, has not had to urinate since. She is planning on getting up and sitting in her chair today and is using incentive spirometry often.   Objective: Vital signs in last 24 hours: Temp:  [97.5 F (36.4 C)-98.4 F (36.9 C)] 97.7 F (36.5 C) (05/10 0446) Pulse Rate:  [50-65] 50 (05/10 0446) Resp:  [11-20] 18 (05/10 0848) BP: (101-115)/(50-59) 115/52 mmHg (05/10 0446) SpO2:  [96 %-100 %] 97 % (05/10 0848) Last BM Date: 01/01/15  Intake/Output from previous day: 05/09 0701 - 05/10 0700 In: 4372.7 [P.O.:1026; I.V.:3346.7] Out: 3212 [Urine:3110; Blood:100] Intake/Output this shift:    General appearance: alert, cooperative and no distress Resp: Breathing comfortably, non-labored. Cardio: regular rate and rhythm GI: Soft, non-distended, appropriately tender.  Lab Results:   Recent Labs  01/01/15 1225 01/02/15 0700  WBC 9.2 8.5  HGB 12.1 11.3*  HCT 36.8 34.3*  PLT 205 212   BMET  Recent Labs  01/01/15 1225 01/02/15 0700  NA  --  137  K  --  3.7  CL  --  102  CO2  --  26  GLUCOSE  --  108*  BUN  --  <5*  CREATININE 0.78 0.78  CALCIUM  --  8.5*   PT/INR No results for input(s): LABPROT, INR in the last 72 hours. ABG No results for input(s): PHART, HCO3 in the last 72 hours.  Invalid input(s): PCO2, PO2  Studies/Results: No results found.  Anti-infectives: Anti-infectives    Start     Dose/Rate Route Frequency Ordered Stop   01/01/15 1300  ciprofloxacin (CIPRO) IVPB 400 mg     400 mg 200 mL/hr over 60 Minutes Intravenous Every 12 hours 01/01/15 1141 01/02/15 0117   01/01/15 1300  metroNIDAZOLE (FLAGYL) IVPB 500 mg     500  mg 100 mL/hr over 60 Minutes Intravenous Every 8 hours 01/01/15 1141 01/01/15 2142   01/01/15 1008  valACYclovir (VALTREX) tablet 1,000 mg     1,000 mg Oral Daily PRN 01/01/15 1009     01/01/15 0622  clindamycin (CLEOCIN) IVPB 900 mg     900 mg 100 mL/hr over 30 Minutes Intravenous 60 min pre-op 01/01/15 0622 01/01/15 0759   01/01/15 0622  gentamicin (GARAMYCIN) 380 mg in dextrose 5 % 100 mL IVPB     5 mg/kg  75.9 kg 109.5 mL/hr over 60 Minutes Intravenous 60 min pre-op 01/01/15 0622 01/01/15 0815      Assessment/Plan: s/p Procedure(s): LAPAROSCOPIC SIGMOID COLECTOMY (N/A)  Advance diet to full liquids later today. Mobilize as tolerated.  LOS: 1 day   [employer], PA-S [facility] [phone] 01/02/2015 9:11 AM

## 2015-01-03 LAB — CBC
HCT: 33.8 % — ABNORMAL LOW (ref 36.0–46.0)
Hemoglobin: 10.9 g/dL — ABNORMAL LOW (ref 12.0–15.0)
MCH: 30.4 pg (ref 26.0–34.0)
MCHC: 32.2 g/dL (ref 30.0–36.0)
MCV: 94.2 fL (ref 78.0–100.0)
PLATELETS: 191 10*3/uL (ref 150–400)
RBC: 3.59 MIL/uL — ABNORMAL LOW (ref 3.87–5.11)
RDW: 14.7 % (ref 11.5–15.5)
WBC: 6.6 10*3/uL (ref 4.0–10.5)

## 2015-01-03 LAB — BASIC METABOLIC PANEL
Anion gap: 7 (ref 5–15)
CALCIUM: 8.6 mg/dL — AB (ref 8.9–10.3)
CO2: 27 mmol/L (ref 22–32)
CREATININE: 0.75 mg/dL (ref 0.44–1.00)
Chloride: 106 mmol/L (ref 101–111)
GLUCOSE: 119 mg/dL — AB (ref 70–99)
Potassium: 4.5 mmol/L (ref 3.5–5.1)
SODIUM: 140 mmol/L (ref 135–145)

## 2015-01-03 MED ORDER — HYDROMORPHONE HCL 1 MG/ML IJ SOLN
0.5000 mg | INTRAMUSCULAR | Status: DC | PRN
  Administered 2015-01-03 (×2): 1 mg via INTRAVENOUS
  Administered 2015-01-03: 2 mg via INTRAVENOUS
  Administered 2015-01-03: 1 mg via INTRAVENOUS
  Filled 2015-01-03 (×2): qty 1
  Filled 2015-01-03: qty 2
  Filled 2015-01-03: qty 1

## 2015-01-03 NOTE — Progress Notes (Signed)
2 Days Post-Op  Subjective: Pt continues to do well today. Pain tolerable with PCA, a little more sore from moving around a bunch yesterday. She began "itching all over" last evening from the morphine and was switched to dilaudid. Itchiness is gone after taking a couple doses of Benadryl, no difficulty breathing or swelling. Positive flatus, 2 loose BM this morning. Tolerating clear liquids well, no N/V. Urinating spontaneously. Ambulating the halls and sitting in chair.   Objective: Vital signs in last 24 hours: Temp:  [97.4 F (36.3 C)-98.8 F (37.1 C)] 98.8 F (37.1 C) (05/11 0611) Pulse Rate:  [50-56] 50 (05/11 0611) Resp:  [14-21] 20 (05/11 0816) BP: (110-129)/(55-66) 124/66 mmHg (05/11 0611) SpO2:  [94 %-98 %] 97 % (05/11 0816) Last BM Date:  (pta)  Intake/Output from previous day: 05/10 0701 - 05/11 0700 In: 2881.7 [P.O.:600; I.V.:2281.7] Out: 100 [Urine:100] Intake/Output this shift:    General appearance: alert, cooperative and no distress Resp: Breathing comfortably, non-labored. Cardio: regular rate and rhythm GI: Soft, non-distended, appropriately tender.  Lab Results:   Recent Labs  01/02/15 0700 01/03/15 0329  WBC 8.5 6.6  HGB 11.3* 10.9*  HCT 34.3* 33.8*  PLT 212 191   BMET  Recent Labs  01/02/15 0700 01/03/15 0329  NA 137 140  K 3.7 4.5  CL 102 106  CO2 26 27  GLUCOSE 108* 119*  BUN <5* <5*  CREATININE 0.78 0.75  CALCIUM 8.5* 8.6*   PT/INR No results for input(s): LABPROT, INR in the last 72 hours. ABG No results for input(s): PHART, HCO3 in the last 72 hours.  Invalid input(s): PCO2, PO2  Studies/Results: No results found.  Anti-infectives: Anti-infectives    Start     Dose/Rate Route Frequency Ordered Stop   01/01/15 1300  ciprofloxacin (CIPRO) IVPB 400 mg     400 mg 200 mL/hr over 60 Minutes Intravenous Every 12 hours 01/01/15 1141 01/02/15 0117   01/01/15 1300  metroNIDAZOLE (FLAGYL) IVPB 500 mg     500 mg 100 mL/hr over 60  Minutes Intravenous Every 8 hours 01/01/15 1141 01/01/15 2142   01/01/15 1008  valACYclovir (VALTREX) tablet 1,000 mg     1,000 mg Oral Daily PRN 01/01/15 1009     01/01/15 0622  clindamycin (CLEOCIN) IVPB 900 mg     900 mg 100 mL/hr over 30 Minutes Intravenous 60 min pre-op 01/01/15 0622 01/01/15 0759   01/01/15 0622  gentamicin (GARAMYCIN) 380 mg in dextrose 5 % 100 mL IVPB     5 mg/kg  75.9 kg 109.5 mL/hr over 60 Minutes Intravenous 60 min pre-op 01/01/15 0622 01/01/15 0815      Assessment/Plan: s/p Procedure(s): LAPAROSCOPIC SIGMOID COLECTOMY (N/A)  PCA - d/c today. Vicodin for pain control. Advance to regular diet today. Mobilize as tolerated. Plan to discharge tomorrow.  LOS: 2 days   [employer], PA-S [facility] [phone] 01/03/2015 8:38 AM

## 2015-01-04 LAB — CBC
HCT: 35.6 % — ABNORMAL LOW (ref 36.0–46.0)
Hemoglobin: 11.6 g/dL — ABNORMAL LOW (ref 12.0–15.0)
MCH: 30.7 pg (ref 26.0–34.0)
MCHC: 32.6 g/dL (ref 30.0–36.0)
MCV: 94.2 fL (ref 78.0–100.0)
PLATELETS: 206 10*3/uL (ref 150–400)
RBC: 3.78 MIL/uL — AB (ref 3.87–5.11)
RDW: 14.2 % (ref 11.5–15.5)
WBC: 7.4 10*3/uL (ref 4.0–10.5)

## 2015-01-04 LAB — BASIC METABOLIC PANEL
Anion gap: 6 (ref 5–15)
CO2: 30 mmol/L (ref 22–32)
CREATININE: 0.76 mg/dL (ref 0.44–1.00)
Calcium: 9 mg/dL (ref 8.9–10.3)
Chloride: 105 mmol/L (ref 101–111)
GFR calc non Af Amer: >60 mL/min (ref >60)
GLUCOSE: 93 mg/dL (ref 65–99)
POTASSIUM: 4.2 mmol/L (ref 3.5–5.1)
Sodium: 141 mmol/L (ref 135–145)

## 2015-01-04 MED ORDER — HYDROCODONE-ACETAMINOPHEN 5-325 MG PO TABS: 1.0000 | ORAL_TABLET | ORAL | Status: DC | PRN

## 2015-01-04 NOTE — Progress Notes (Signed)
Discharge instructions reviewed with the patient. Rx given. Pt stated she feels ready for discharge and will be waiting for her ride home. Pt receptive to information that was given.

## 2015-01-04 NOTE — Consult Note (Signed)
   [facility] CM Inpatient Consult   01/04/2015  ERA PARR [birth year] [id number]   Came to visit patient on behalf of [employer] program for [employer] employees/dependents with [employer]. Explained program. No identifiable needs. Patient endorses she is doing much better and pleasantly declined post hospital follow up call. Left Link to Wellness brochure and contact information should she need services in the future. Appreciative of visit.   Marthenia Rolling, MSN-Ed, RN,BSN [facility] Liaison [phone]

## 2015-01-04 NOTE — Discharge Summary (Signed)
Physician Discharge Summary  Patient ID: Kari [name] MRN: [id number] DOB/AGE: Jul 17, 1951 64 y.o.  Admit date: 01/01/2015 Discharge date: 01/04/2015  Admission Diagnoses:  Patient Active Problem List   Diagnosis Date Noted  . Near syncope 03/11/2013  . Bradycardia 02/21/2013  . S/P cardiac pacemaker procedure 02/20/2013  . Chest tightness 02/19/2013  . Palpitations 02/19/2013  . 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  . Diverticulitis 03/03/2012  . Chronic low back pain 08/28/2011  . GERD (gastroesophageal reflux disease)   . Herpes zoster conjunctivitis   . Menopause   . Arthritis   . Diverticulosis   . Allergy    Discharge Diagnoses:  Active Problems:   Diverticulitis   Discharged Condition: stable  Hospital Course: Pt admitted to the floor after a laparoscopic Sigmoid colectomy and takedown of the splenic flexure on 01/01/2015. Pt did well overnight with pain controlled by PCA. Foley removed POD#1 and able to urinate spontaneously. Tolerated clear liquid diet with some flauts and no N/V, advanced to full liquids. Pt began to itch with morphine PCA. PCA was switched to dialudid and benadryl helped control itching. POD#2 pt tolerated full liquid diet well with no N/V and had flatus some loose stools, advanced to full diet. PCA d/c'd and switch to vicodin for pain control. Pt being discharged POD#3 to home/self-care after removing on-q and dressing.  Consults: None  Significant Diagnostic Studies: labs: CBC and BMet stable and Pathology of removed specimen showed benign colorectal mucosa with diverticular disease.  Treatments: IV hydration, antibiotics: Cipro, gentamycin, metronidazole and Clindamycin, analgesia: Morphine PCA switched to Dilaudid PCA switched to po vicodin, anticoagulation: LMW heparin and surgery: Laparoscopic sigmoid colectomy, takedown of splenic flexure.  Discharge Exam: Blood pressure 133/61, pulse 64, temperature 98.8 F (37.1 C),  temperature source Oral, resp. rate 18, height 5\' 8"  (1.727 m), weight 167 lb 5 oz (75.892 kg), SpO2 96 %. General appearance: alert, cooperative and no distress Resp: breathing comfortably, non-labored GI: soft, mildly distended, appropriately tender.  Disposition: 01-Home or Self Care  Discharge Instructions    Call MD for:  persistant nausea and vomiting    Complete by:  As directed      Call MD for:  redness, tenderness, or signs of infection (pain, swelling, redness, odor or green/yellow discharge around incision site)    Complete by:  As directed      Call MD for:  severe uncontrolled pain    Complete by:  As directed      Call MD for:  temperature >100.4    Complete by:  As directed      Diet - low sodium heart healthy    Complete by:  As directed      Increase activity slowly    Complete by:  As directed             Medication List    STOP taking these medications        ciprofloxacin 500 MG tablet  Commonly known as:  CIPRO     metroNIDAZOLE 500 MG tablet  Commonly known as:  FLAGYL      TAKE these medications        aspirin EC 81 MG tablet  Take 81 mg by mouth daily.     azithromycin 250 MG tablet  Commonly known as:  ZITHROMAX  Take 1 tablet (250 mg total) by mouth daily. Take first 2 tablets together, then 1 every day until finished.     cephALEXin 500 MG capsule  Commonly  known as:  KEFLEX  Take 1 capsule (500 mg total) by mouth 4 (four) times daily.     cholecalciferol 1000 UNITS tablet  Commonly known as:  VITAMIN D  Take 1,000 Units by mouth daily.     EQL OMEGA 3 FISH OIL 1200 MG Caps  Take 3,600 mg by mouth daily.     fluticasone 50 MCG/ACT nasal spray  Commonly known as:  FLONASE  Place into both nostrils daily.     HYDROcodone-acetaminophen 5-325 MG per tablet  Commonly known as:  NORCO/VICODIN  Take 1-2 tablets by mouth every 4 (four) hours as needed for moderate pain.     ipratropium 0.06 % nasal spray  Commonly known as:  ATROVENT   Place 2 sprays into both nostrils 4 (four) times daily.     multivitamin with minerals Tabs tablet  Take 1 tablet by mouth daily.     nitrofurantoin (macrocrystal-monohydrate) 100 MG capsule  Commonly known as:  MACROBID  Take 1 capsule (100 mg total) by mouth 2 (two) times daily.     PROBIOTIC FORMULA PO  Take 2 capsules by mouth every morning.     valACYclovir 1000 MG tablet  Commonly known as:  VALTREX  Take 1,000 mg by mouth daily as needed (herpes zoster).     Vitamin B-12 2500 MCG Subl  Place 2,500 mcg under the tongue daily.           Follow-up Information    Follow up with [facility], MD In 2 weeks.   Specialty:  General Surgery   Contact information:   [address] [place] [address] [phone]       Signed: Woodfin Ganja, PA-S [facility] [phone] 01/04/2015 10:46 AM

## 2015-02-07 ENCOUNTER — Ambulatory Visit (INDEPENDENT_AMBULATORY_CARE_PROVIDER_SITE_OTHER): Payer: 59 | Admitting: Internal Medicine

## 2015-02-07 ENCOUNTER — Encounter: Payer: Self-pay | Admitting: Internal Medicine

## 2015-02-07 VITALS — BP 118/82 | HR 64 | Ht 68.0 in | Wt 164.0 lb

## 2015-02-07 DIAGNOSIS — I441 Atrioventricular block, second degree: Secondary | ICD-10-CM

## 2015-02-07 DIAGNOSIS — R002 Palpitations: Secondary | ICD-10-CM

## 2015-02-07 DIAGNOSIS — R55 Syncope and collapse: Secondary | ICD-10-CM

## 2015-02-07 DIAGNOSIS — Z950 Presence of cardiac pacemaker: Secondary | ICD-10-CM | POA: Diagnosis not present

## 2015-02-07 DIAGNOSIS — R001 Bradycardia, unspecified: Secondary | ICD-10-CM

## 2015-02-07 LAB — CUP PACEART INCLINIC DEVICE CHECK
Date Time Interrogation Session: 20160615040000
Lead Channel Impedance Value: 467 Ohm
Lead Channel Pacing Threshold Amplitude: 0.7 V
Lead Channel Pacing Threshold Amplitude: 1 V
Lead Channel Sensing Intrinsic Amplitude: 3.2 mV
Lead Channel Setting Pacing Pulse Width: 0.4 ms
MDC IDC MSMT LEADCHNL RA IMPEDANCE VALUE: 489 Ohm
MDC IDC MSMT LEADCHNL RA PACING THRESHOLD PULSEWIDTH: 0.5 ms
MDC IDC MSMT LEADCHNL RV PACING THRESHOLD PULSEWIDTH: 0.4 ms
MDC IDC SET LEADCHNL RA PACING AMPLITUDE: 2 V
MDC IDC SET LEADCHNL RV PACING AMPLITUDE: 2.4 V
MDC IDC SET LEADCHNL RV SENSING SENSITIVITY: 2.5 mV
Pulse Gen Serial Number: 112476
Zone Setting Detection Interval: 375 ms

## 2015-02-07 NOTE — Assessment & Plan Note (Signed)
Her symptoms have resolved with pacing. Will follow.

## 2015-02-07 NOTE — Progress Notes (Signed)
HPI Mrs. Kari [name] returns today for followup. She is a pleasant 64 yo woman wnith symptomatic CHB, s/p PPM insertion, who initially had trouble after her PPM was inserted with palpitations, far field oversensing, and PM wenckebach. Her device was reprogrammed. She has improved. She still has palpitations but is mostly better. No syncope. No chest pain or shortness of breath with exertion. No peripheral edema. In the interim, she has undergone colectomy due to diverticulitis.  Allergies  Allergen Reactions  . Ceftin Hives    Tolerates amoxicillin  . Levofloxacin Other (See Comments)    "spacey," tolerates Cipro  . Vancomycin Hives     Current Outpatient Prescriptions  Medication Sig Dispense Refill  . aspirin EC 81 MG tablet Take 81 mg by mouth daily.    . cholecalciferol (VITAMIN D) 1000 UNITS tablet Take 1,000 Units by mouth daily.    . Cyanocobalamin (VITAMIN B-12) 2500 MCG SUBL Place 2,500 mcg under the tongue daily.    . fluticasone (FLONASE) 50 MCG/ACT nasal spray Place into both nostrils daily.    Marland Kitchen HYDROcodone-acetaminophen (NORCO/VICODIN) 5-325 MG per tablet Take 1-2 tablets by mouth every 4 (four) hours as needed for moderate pain. 40 tablet 0  . Multiple Vitamin (MULTIVITAMIN WITH MINERALS) TABS Take 1 tablet by mouth daily.    . Omega-3 Fatty Acids (EQL OMEGA 3 FISH OIL) 1200 MG CAPS Take 3,600 mg by mouth daily.    . Probiotic Product (PROBIOTIC ADVANCED PO) Take by mouth daily.    . Probiotic Product (PROBIOTIC FORMULA PO) Take 2 capsules by mouth every morning.     . valACYclovir (VALTREX) 1000 MG tablet Take 1,000 mg by mouth daily as needed (herpes zoster).      No current facility-administered medications for this visit.     Past Medical History  Diagnosis Date  . Herpes zoster conjunctivitis   . Menopause   . Seasonal allergies     "spring & fall; not q year" (02/17/2013)  . Mobitz type 2 second degree AV block     2:1/notes 02/16/2013  . Presence of permanent  cardiac pacemaker   . Pneumonia 6 1/24yrs ago  . History of bronchitis Feb 2016  . Joint pain   . Chronic back pain   . Diverticulitis   . Arthritis   . PONV (postoperative nausea and vomiting)     ROS:   All systems reviewed and negative except as noted in the HPI.   Past Surgical History  Procedure Laterality Date  . Arthrogram knee Right 1975  . Insert / replace / remove pacemaker  02/17/2013    [facility] [employer] dual-chamber pacemaker, model [id number],  . Tonsillectomy  1961  . Cholecystectomy  1990's  . Knee arthroscopy Right 1982, [place]  . Knee arthroscopy w/ acl reconstruction Left 1984  . 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 Trigger finger release Right ~ 2012    "thumb" (02/17/2013)  . Permanent pacemaker insertion N/A 02/17/2013    Procedure: PERMANENT PACEMAKER INSERTION;  Surgeon: Evans Lance, MD;  Location: [facility] CATH LAB;  Service: Cardiovascular;  Laterality: N/A;  . Achilles tendon surgery    . Vaginal hysterectomy  ~ 1994    partial   . Colonscopy    . Esophagogastroduodenoscopy    . Laparoscopic sigmoid colectomy N/A 01/01/2015    Procedure: LAPAROSCOPIC SIGMOID COLECTOMY;  Surgeon: Stark Klein, MD;  Location: [place];  Service: General;  Laterality: N/A;  Family History  Problem Relation Age of Onset  . Hypertension Mother   . Hyperlipidemia Mother   . Macular degeneration Mother   . COPD Mother   . Lung cancer Father   . Cancer Father     Lung  . COPD Father   . Diabetes Brother   . Breast cancer Paternal Aunt      History   Social History  . Marital Status: Married    Spouse Name: N/A  . Number of Children: N/A  . Years of Education: N/A   Occupational History  . RN     [employer]   Social History Main Topics  . Smoking status: Never Smoker   . Smokeless tobacco: Never Used  . Alcohol Use: No  . Drug Use: No  . Sexual Activity: Yes    Birth  Control/ Protection: Surgical     Comment: 02/17/2013 "I have a same sex partner"   Other Topics Concern  . Not on file   Social History Narrative     Blood pressure 145/87, pulse 71, height 5 foot 8 inches, weight 170 pounds, body mass index 25.9  Physical Exam:  Well appearing 64 year old woman, NAD HEENT: Unremarkable Neck:  6 cm JVD, no thyromegally Back:  No CVA tenderness Lungs:  Clear with no wheezes, rales, or rhonchi. HEART:  Regular rate rhythm, no murmurs, no rubs, no clicks. Well-healed pacemaker incision Abd:  soft, positive bowel sounds, no organomegally, no rebound, no guarding Ext:  2 plus pulses, no edema, no cyanosis, no clubbing Skin:  No rashes no nodules Neuro:  CN II through XII intact, motor grossly intact  DEVICE  Normal device function.  See PaceArt for details.   Assess/Plan:

## 2015-02-07 NOTE — Assessment & Plan Note (Signed)
Her [employer] DDD PM is working normally. Will recheck in several months. She still has far field oversensing on the atrial lead of ventricular activity which has been programmed around. Today she has complete heart block.

## 2015-02-07 NOTE — Assessment & Plan Note (Signed)
Her symptoms are improved. Will follow.

## 2015-02-07 NOTE — Patient Instructions (Signed)
Medication Instructions: - none  Labwork: - none  Procedures/Testing: - none  Follow-Up: - Remote monitoring is used to monitor your Pacemaker of ICD from home. This monitoring reduces the number of office visits required to check your device to one time per year. It allows [place] to keep an eye on the functioning of your device to ensure it is working properly. You are scheduled for a device check from home on 05/09/15. You may send your transmission at any time that day. If you have a wireless device, the transmission will be sent automatically. After your physician reviews your transmission, you will receive a postcard with your next transmission date.  - Your physician wants you to follow-up in: 1 year with Dr. Lovena Le. You will receive a reminder letter in the mail two months in advance. If you don't receive a letter, please call our office to schedule the follow-up appointment.  Any Additional Special Instructions Will Be Listed Below (If Applicable).

## 2015-05-09 ENCOUNTER — Telehealth: Payer: Self-pay | Admitting: Cardiology

## 2015-05-09 ENCOUNTER — Ambulatory Visit (INDEPENDENT_AMBULATORY_CARE_PROVIDER_SITE_OTHER): Payer: 59 | Admitting: *Deleted

## 2015-05-09 DIAGNOSIS — I441 Atrioventricular block, second degree: Secondary | ICD-10-CM

## 2015-05-09 NOTE — Telephone Encounter (Signed)
LMOVM reminding pt to send remote transmission.   

## 2015-05-10 ENCOUNTER — Encounter: Payer: Self-pay | Admitting: Cardiology

## 2015-05-11 DIAGNOSIS — I441 Atrioventricular block, second degree: Secondary | ICD-10-CM | POA: Diagnosis not present

## 2015-05-14 NOTE — Progress Notes (Signed)
Remote pacemaker transmission.   

## 2015-05-17 ENCOUNTER — Telehealth: Payer: Self-pay | Admitting: Internal Medicine

## 2015-05-17 NOTE — Telephone Encounter (Signed)
New message      Did we get a FMLA from [employer].  Pt works for [employer] and time limit on when the form needs to be returned.  Please call and let her know if we got it

## 2015-05-18 NOTE — Telephone Encounter (Signed)
Called left patient voicemail.

## 2015-05-18 NOTE — Telephone Encounter (Signed)
Spoke with patient made her aware i have not received any FMLA forms.

## 2015-05-21 LAB — CUP PACEART REMOTE DEVICE CHECK
Battery Remaining Longevity: 126 mo
Battery Remaining Percentage: 100 %
Date Time Interrogation Session: 20160916201800
Lead Channel Impedance Value: 464 Ohm
Lead Channel Pacing Threshold Pulse Width: 0.5 ms
Lead Channel Setting Pacing Amplitude: 2 V
Lead Channel Setting Pacing Pulse Width: 0.4 ms
Lead Channel Setting Sensing Sensitivity: 2.5 mV
MDC IDC MSMT LEADCHNL RA IMPEDANCE VALUE: 471 Ohm
MDC IDC MSMT LEADCHNL RA PACING THRESHOLD AMPLITUDE: 0.7 V
MDC IDC PG SERIAL: 112476
MDC IDC SET LEADCHNL RV PACING AMPLITUDE: 2.4 V
MDC IDC STAT BRADY RA PERCENT PACED: 15 %
MDC IDC STAT BRADY RV PERCENT PACED: 99 %
Zone Setting Detection Interval: 375 ms

## 2015-06-05 ENCOUNTER — Encounter: Payer: Self-pay | Admitting: Cardiology

## 2015-06-09 ENCOUNTER — Ambulatory Visit (INDEPENDENT_AMBULATORY_CARE_PROVIDER_SITE_OTHER): Payer: 59 | Admitting: Family Medicine

## 2015-06-09 VITALS — BP 116/78 | HR 62 | Temp 98.1°F | Resp 16 | Ht 68.0 in

## 2015-06-09 DIAGNOSIS — J0100 Acute maxillary sinusitis, unspecified: Secondary | ICD-10-CM | POA: Diagnosis not present

## 2015-06-09 MED ORDER — AMOXICILLIN 500 MG PO CAPS: 500.0000 mg | ORAL_CAPSULE | Freq: Two times a day (BID) | ORAL | Status: DC

## 2015-06-09 NOTE — Patient Instructions (Signed)

## 2015-06-09 NOTE — Progress Notes (Signed)
@UMFCLOGO @  This chart was scribed for Robyn Haber, MD by Thea Alken, ED Scribe. This patient was seen in room 2 and the patient's care was started at 8:40 AM.  Patient ID: Kari [name] MRN: [id number], DOB: Oct 28, 1950, 64 y.o. Date of Encounter: 06/09/2015, 8:41 AM  Primary Physician: Kelton Pillar, MD  Chief Complaint:  Chief Complaint  Patient presents with  . Sinusitis    x 1 week   . Sore Throat  . Headache  . Cough    HPI: 64 y.o. year old female with history below presents with possible sinus infection.  Pt states she woke up with a sore throat 4 days ago. States symptoms worsened the follow day with HA, sinus pressure, drainage, chills and neck soreness. She has hx of diverticulitis and reports last dose of abx was a couple months ago. She denies fever and cough .  Pt is a Therapist, sports at [employer].   Past Medical History  Diagnosis Date  . Herpes zoster conjunctivitis   . Menopause   . Seasonal allergies     "spring & fall; not q year" (02/17/2013)  . Mobitz type 2 second degree AV block     2:1/notes 02/16/2013  . Presence of permanent cardiac pacemaker   . Pneumonia 6 1/14yrs ago  . History of bronchitis Feb 2016  . Joint pain   . Chronic back pain   . Diverticulitis   . Arthritis   . PONV (postoperative nausea and vomiting)      Home Meds: Prior to Admission medications   Medication Sig Start Date End Date Taking? Authorizing Provider  aspirin EC 81 MG tablet Take 81 mg by mouth daily.   Yes Historical Provider, MD  cholecalciferol (VITAMIN D) 1000 UNITS tablet Take 1,000 Units by mouth daily.   Yes Historical Provider, MD  Cyanocobalamin (VITAMIN B-12) 2500 MCG SUBL Place 2,500 mcg under the tongue daily.   Yes Historical Provider, MD  fluticasone (FLONASE) 50 MCG/ACT nasal spray Place into both nostrils daily.   Yes Historical Provider, MD  HYDROcodone-acetaminophen (NORCO/VICODIN) 5-325 MG per tablet Take 1-2 tablets by mouth every 4 (four)  hours as needed for moderate pain. 01/04/15  Yes Stark Klein, MD  Multiple Vitamin (MULTIVITAMIN WITH MINERALS) TABS Take 1 tablet by mouth daily.   Yes Historical Provider, MD  Omega-3 Fatty Acids (EQL OMEGA 3 FISH OIL) 1200 MG CAPS Take 3,600 mg by mouth daily.   Yes Historical Provider, MD  Probiotic Product (PROBIOTIC ADVANCED PO) Take by mouth daily.   Yes Historical Provider, MD  Probiotic Product (PROBIOTIC FORMULA PO) Take 2 capsules by mouth every morning.    Yes Historical Provider, MD  valACYclovir (VALTREX) 1000 MG tablet Take 1,000 mg by mouth daily as needed (herpes zoster).    Yes Historical Provider, MD    Allergies:  Allergies  Allergen Reactions  . Ceftin Hives    Tolerates amoxicillin  . Levofloxacin Other (See Comments)    "spacey," tolerates Cipro  . Vancomycin Hives    Social History   Social History  . Marital Status: Married    Spouse Name: N/A  . Number of Children: N/A  . Years of Education: N/A   Occupational History  . RN     [employer]   Social History Main Topics  . Smoking status: Never Smoker   . Smokeless tobacco: Never Used  . Alcohol Use: No  . Drug Use: No  . Sexual Activity: Yes  Birth Control/ Protection: Surgical     Comment: 02/17/2013 "I have a same sex partner"   Other Topics Concern  . Not on file   Social History Narrative     Review of Systems: Constitutional: negative for fever, night sweats, weight changes, or fatigue  HEENT: negative for vision changes, hearing loss, congestion, rhinorrhea, ST, epistaxis. Cardiovascular: negative for chest pain or palpitations Respiratory: negative for hemoptysis, wheezing, shortness of breath, or cough Abdominal: negative for abdominal pain, nausea, vomiting, diarrhea, or constipation Dermatological: negative for rash Neurologic: negative for dizziness, or syncope All other systems reviewed and are otherwise negative with the exception to those above and in the  HPI.   Physical Exam: Blood pressure 116/78, pulse 62, temperature 98.1 F (36.7 C), temperature source Oral, resp. rate 16, height 5\' 8"  (1.727 m), SpO2 99 %., There is no weight on file to calculate BMI. General: Well developed, well nourished, in no acute distress. Head: Normocephalic, atraumatic, eyes without discharge, sclera non-icteric, nares are without discharge. Bilateral auditory canals clear, TM's are without perforation, pearly grey and translucent with reflective cone of light bilaterally. Oral cavity moist, posterior pharynx without exudate, erythema, peritonsillar abscess, or post nasal drip.  Neck: Supple. No thyromegaly. Full ROM. No lymphadenopathy. Lungs: Clear bilaterally to auscultation without wheezes, rales, or rhonchi. Breathing is unlabored. Heart: RRR with S1 S2. No murmurs, rubs, or gallops appreciated. Abdomen: Soft, non-tender, non-distended with normoactive bowel sounds. No hepatomegaly. No rebound/guarding. No obvious abdominal masses. Msk:  Strength and tone normal for age. Extremities/Skin: Warm and dry. No clubbing or cyanosis. No edema. No rashes or suspicious lesions. Neuro: Alert and oriented X 3. Moves all extremities spontaneously. Gait is normal. CNII-XII grossly in tact. Psych:  Responds to questions appropriately with a normal affect.    ASSESSMENT AND PLAN:  64 y.o. year old female with  This chart was scribed in my presence and reviewed by me personally.    ICD-9-CM ICD-10-CM   1. Acute maxillary sinusitis, recurrence not specified 461.0 J01.00 amoxicillin (AMOXIL) 500 MG capsule    By signing my name below, I, Raven Small, attest that this documentation has been prepared under the direction and in the presence of Robyn Haber, MD.  Electronically Signed: Thea Alken, ED Scribe. 06/09/2015. 8:50 AM.  Signed, Robyn Haber, MD 06/09/2015 8:41 AM

## 2015-06-13 ENCOUNTER — Other Ambulatory Visit: Payer: Self-pay | Admitting: Family Medicine

## 2015-06-13 ENCOUNTER — Telehealth: Payer: Self-pay

## 2015-06-13 DIAGNOSIS — J0100 Acute maxillary sinusitis, unspecified: Secondary | ICD-10-CM

## 2015-06-13 MED ORDER — AMOXICILLIN-POT CLAVULANATE 875-125 MG PO TABS: 1.0000 | ORAL_TABLET | Freq: Two times a day (BID) | ORAL | Status: DC

## 2015-06-13 NOTE — Telephone Encounter (Signed)
Pt was seen on 10/15 by Dr. Joseph Art for Acute maxillary sinusitis, recurrence not specified - Primary. She is not doing any better and would like to be prescribed something different. Please advise at [phone]

## 2015-06-15 ENCOUNTER — Encounter: Payer: Self-pay | Admitting: Internal Medicine

## 2015-06-25 ENCOUNTER — Telehealth: Payer: Self-pay | Admitting: Internal Medicine

## 2015-06-25 NOTE — Telephone Encounter (Signed)
Did not receive papers until 06/12/15 and Dr Lovena Le completed today.  Kim in [place] has forms and will notify patient

## 2015-06-25 NOTE — Telephone Encounter (Signed)
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 Pt states it is ok to leave her a vm letting her know when the FMLA forms are complete.

## 2015-06-25 NOTE — Telephone Encounter (Signed)
New Message    Pt was notified that her FMLA paperwork was on Dr. Tanna Furry desk for him to sign today prior to him leaving. Pt wants to make sure that it is going to get done and that she receives a copy of it and that it is also sent to [employer]. Pt was very upset that when she called and states that it has been 2 months now since she has dealt with this. Please call pt back once this is done.

## 2015-06-26 ENCOUNTER — Telehealth: Payer: Self-pay | Admitting: Internal Medicine

## 2015-06-26 NOTE — Telephone Encounter (Signed)
FMLA completed and signed,faxed to [employer], LVM for patient

## 2015-06-26 NOTE — Telephone Encounter (Signed)
Per patient VM for me-ok to mail FMLA to her home address placed in mail today.

## 2015-08-13 ENCOUNTER — Telehealth: Payer: Self-pay | Admitting: Cardiology

## 2015-08-13 ENCOUNTER — Ambulatory Visit (INDEPENDENT_AMBULATORY_CARE_PROVIDER_SITE_OTHER): Payer: 59 | Admitting: *Deleted

## 2015-08-13 DIAGNOSIS — I441 Atrioventricular block, second degree: Secondary | ICD-10-CM | POA: Diagnosis not present

## 2015-08-13 NOTE — Telephone Encounter (Signed)
LMOVM reminding pt to send remote transmission.   

## 2015-08-13 NOTE — Progress Notes (Signed)
Remote pacemaker transmission.   

## 2015-08-16 ENCOUNTER — Encounter: Payer: Self-pay | Admitting: Cardiology

## 2015-08-16 LAB — CUP PACEART REMOTE DEVICE CHECK
Battery Remaining Longevity: 126 mo
Brady Statistic RA Percent Paced: 13 %
Implantable Lead Implant Date: 20140626
Implantable Lead Location: 753859
Implantable Lead Location: 753860
Implantable Lead Model: 5076
Implantable Lead Model: 5076
Lead Channel Impedance Value: 478 Ohm
Lead Channel Impedance Value: 481 Ohm
Lead Channel Sensing Intrinsic Amplitude: 4.7 mV
Lead Channel Setting Pacing Amplitude: 2.4 V
Lead Channel Setting Pacing Pulse Width: 0.4 ms
MDC IDC LEAD IMPLANT DT: 20140626
MDC IDC MSMT BATTERY REMAINING PERCENTAGE: 100 %
MDC IDC SESS DTM: 20161219175300
MDC IDC SET LEADCHNL RA PACING AMPLITUDE: 2 V
MDC IDC SET LEADCHNL RV SENSING SENSITIVITY: 2.5 mV
MDC IDC STAT BRADY RV PERCENT PACED: 99 %
Pulse Gen Serial Number: 112476

## 2015-08-30 MED FILL — HYDROCODON-APAP 5-325: 5-325 | 20 days supply | Qty: 60 | Fill #0

## 2015-09-26 MED FILL — valACYclovir HCL 1 GM TABS: 1 | 10 days supply | Qty: 30 | Fill #0

## 2015-09-26 MED FILL — HYDROCODON-APAP 5-325: 5-325 | 20 days supply | Qty: 60 | Fill #0

## 2015-10-23 MED FILL — HYDROCODON-APAP 5-325: 5-325 | 20 days supply | Qty: 60 | Fill #0

## 2015-11-08 ENCOUNTER — Other Ambulatory Visit: Payer: Self-pay

## 2015-11-08 DIAGNOSIS — Z1231 Encounter for screening mammogram for malignant neoplasm of breast: Secondary | ICD-10-CM

## 2015-11-09 DIAGNOSIS — M5136 Other intervertebral disc degeneration, lumbar region: Secondary | ICD-10-CM | POA: Diagnosis not present

## 2015-11-09 DIAGNOSIS — M5441 Lumbago with sciatica, right side: Secondary | ICD-10-CM | POA: Diagnosis not present

## 2015-11-09 DIAGNOSIS — M4726 Other spondylosis with radiculopathy, lumbar region: Secondary | ICD-10-CM | POA: Diagnosis not present

## 2015-11-09 DIAGNOSIS — M5416 Radiculopathy, lumbar region: Secondary | ICD-10-CM | POA: Diagnosis not present

## 2015-11-12 ENCOUNTER — Ambulatory Visit (INDEPENDENT_AMBULATORY_CARE_PROVIDER_SITE_OTHER): Payer: 59 | Admitting: *Deleted

## 2015-11-12 DIAGNOSIS — I441 Atrioventricular block, second degree: Secondary | ICD-10-CM

## 2015-11-14 ENCOUNTER — Ambulatory Visit: Payer: 59

## 2015-11-16 MED FILL — HYDROCODON-APAP 5-325: 5-325 | 20 days supply | Qty: 60 | Fill #0

## 2015-11-16 NOTE — Progress Notes (Signed)
Remote pacemaker transmission.   

## 2015-12-13 ENCOUNTER — Ambulatory Visit (HOSPITAL_BASED_OUTPATIENT_CLINIC_OR_DEPARTMENT_OTHER)
Admission: RE | Admit: 2015-12-13 | Discharge: 2015-12-13 | Disposition: A | Discharge status: Home / Self Care | Payer: 59 | Source: Ambulatory Visit | Attending: Internal Medicine | Admitting: Internal Medicine

## 2015-12-13 DIAGNOSIS — Z1231 Encounter for screening mammogram for malignant neoplasm of breast: Secondary | ICD-10-CM | POA: Insufficient documentation

## 2015-12-13 MED FILL — HYDROCODON-APAP 5-325: 5-325 | 20 days supply | Qty: 60 | Fill #0

## 2015-12-17 MED FILL — valACYclovir HCL 1 GM TABS: 1 | 30 days supply | Qty: 30 | Fill #0

## 2015-12-21 LAB — CUP PACEART REMOTE DEVICE CHECK
Date Time Interrogation Session: 20170321225500
Implantable Lead Location: 753860
Lead Channel Impedance Value: 443 Ohm
Lead Channel Setting Pacing Amplitude: 2 V
Lead Channel Setting Pacing Amplitude: 2.4 V
MDC IDC LEAD IMPLANT DT: 20140626
MDC IDC LEAD IMPLANT DT: 20140626
MDC IDC LEAD LOCATION: 753859
MDC IDC MSMT BATTERY REMAINING LONGEVITY: 114 mo
MDC IDC MSMT BATTERY REMAINING PERCENTAGE: 100 %
MDC IDC MSMT LEADCHNL RV IMPEDANCE VALUE: 460 Ohm
MDC IDC PG SERIAL: 112476
MDC IDC SET LEADCHNL RV PACING PULSEWIDTH: 0.4 ms
MDC IDC SET LEADCHNL RV SENSING SENSITIVITY: 2.5 mV
MDC IDC STAT BRADY RA PERCENT PACED: 12 %
MDC IDC STAT BRADY RV PERCENT PACED: 100 %

## 2015-12-25 ENCOUNTER — Encounter: Payer: Self-pay | Admitting: Cardiology

## 2016-01-09 MED FILL — HYDROCODON-APAP 5-325: 5-325 | 20 days supply | Qty: 60 | Fill #0

## 2016-01-16 ENCOUNTER — Ambulatory Visit (INDEPENDENT_AMBULATORY_CARE_PROVIDER_SITE_OTHER): Payer: 59 | Admitting: *Deleted

## 2016-01-16 ENCOUNTER — Encounter: Payer: Self-pay | Admitting: Internal Medicine

## 2016-01-16 DIAGNOSIS — I441 Atrioventricular block, second degree: Secondary | ICD-10-CM

## 2016-01-16 LAB — CUP PACEART INCLINIC DEVICE CHECK
Date Time Interrogation Session: 20170524040000
Implantable Lead Implant Date: 20140626
Implantable Lead Location: 753859
Implantable Lead Model: 5076
Lead Channel Impedance Value: 383 Ohm
Lead Channel Impedance Value: 456 Ohm
Lead Channel Pacing Threshold Amplitude: 1 V
Lead Channel Setting Pacing Amplitude: 2 V
Lead Channel Setting Sensing Sensitivity: 2.5 mV
MDC IDC LEAD IMPLANT DT: 20140626
MDC IDC LEAD LOCATION: 753860
MDC IDC MSMT LEADCHNL RA PACING THRESHOLD AMPLITUDE: 0.6 V
MDC IDC MSMT LEADCHNL RA PACING THRESHOLD PULSEWIDTH: 0.5 ms
MDC IDC MSMT LEADCHNL RA SENSING INTR AMPL: 4.7 mV
MDC IDC MSMT LEADCHNL RV PACING THRESHOLD PULSEWIDTH: 0.4 ms
MDC IDC PG SERIAL: 112476
MDC IDC SET LEADCHNL RV PACING AMPLITUDE: 2.4 V
MDC IDC SET LEADCHNL RV PACING PULSEWIDTH: 0.4 ms
MDC IDC STAT BRADY RA PERCENT PACED: 12 %
MDC IDC STAT BRADY RV PERCENT PACED: 100 %

## 2016-01-16 NOTE — Progress Notes (Signed)
Pacemaker check in clinic for brief episodes of dizziness and patient is traveling out of the country next week. Normal device function. Thresholds, sensing, impedances consistent with previous measurements. Brief instance of questionable EMI noted on May 22nd, no inhibition of V pacing noted. Atrial lead noise noted when patient performs isometric exercises with left arm. No ventricular lead involvement. Device programmed to maximize longevity. No mode switches. 75 NSVT episodes- 2 EGMS available- these show AT and the instance of ? EMI. Device programmed at appropriate safety margins. Histogram distribution appropriate for patient activity level. Device programmed to optimize intrinsic conduction. Estimated longevity 10 years. Patient enrolled in remote follow-up. ROV with GT 03/06/16.

## 2016-02-01 MED FILL — HYDROCODON-APAP 5-325: 5-325 | 20 days supply | Qty: 60 | Fill #0

## 2016-02-05 ENCOUNTER — Telehealth: Payer: Self-pay | Admitting: Internal Medicine

## 2016-02-05 NOTE — Telephone Encounter (Signed)
Reviewed with Dr. Lovena Le.  Per Dr. Lovena Le, patient may scuba dive, but also advised that the deepest safe depth is unknown.  Advised patient of Dr. Tanna Furry recommendations.  She states that this is not very comforting information.  Advised patient that it just means that safe depth may have not been studied.  Advised patient that if she knows the specific depth of the waters she will be in or if she has specific questions regarding PPM safety with scuba diving, she can contact [employer] and they may be able to offer her more information.  Gave patient phone number for tech services/patient assistance.  Patient verbalizes understanding and is appreciative of assistance.  She denies additional questions at this time.

## 2016-02-05 NOTE — Telephone Encounter (Signed)
Patient called back to make [place] aware that she called tech services and that the representative told her that she can safely scuba dive up to 170ft.  Patient wanted to make [place] aware.  Patient is appreciative and denies additional questions or concerns at this time.

## 2016-02-05 NOTE — Telephone Encounter (Signed)
New message  Pt request a call back to discuss if its ok to scubba dive with a pacer. If it is ok to go into deep waters.

## 2016-02-29 MED FILL — HYDROCODON-APAP 5-325: 5-325 | 20 days supply | Qty: 60 | Fill #0

## 2016-03-06 ENCOUNTER — Ambulatory Visit (INDEPENDENT_AMBULATORY_CARE_PROVIDER_SITE_OTHER): Payer: 59 | Admitting: Internal Medicine

## 2016-03-06 ENCOUNTER — Encounter: Payer: Self-pay | Admitting: Internal Medicine

## 2016-03-06 VITALS — BP 126/74 | HR 64 | Ht 68.0 in

## 2016-03-06 DIAGNOSIS — I4729 Other ventricular tachycardia: Secondary | ICD-10-CM

## 2016-03-06 DIAGNOSIS — I472 Ventricular tachycardia: Secondary | ICD-10-CM

## 2016-03-06 DIAGNOSIS — R42 Dizziness and giddiness: Secondary | ICD-10-CM

## 2016-03-06 LAB — CUP PACEART INCLINIC DEVICE CHECK
Brady Statistic RA Percent Paced: 12 %
Brady Statistic RV Percent Paced: 99 %
Implantable Lead Implant Date: 20140626
Implantable Lead Location: 753859
Implantable Lead Model: 5076
Lead Channel Impedance Value: 386 Ohm
Lead Channel Impedance Value: 453 Ohm
Lead Channel Pacing Threshold Amplitude: 0.5 V
Lead Channel Pacing Threshold Pulse Width: 0.4 ms
Lead Channel Pacing Threshold Pulse Width: 0.5 ms
Lead Channel Setting Pacing Amplitude: 2 V
MDC IDC LEAD IMPLANT DT: 20140626
MDC IDC LEAD LOCATION: 753860
MDC IDC MSMT BATTERY REMAINING LONGEVITY: 114 mo
MDC IDC MSMT LEADCHNL RA SENSING INTR AMPL: 6.2 mV
MDC IDC MSMT LEADCHNL RV PACING THRESHOLD AMPLITUDE: 1.1 V
MDC IDC SESS DTM: 20170713040000
MDC IDC SET LEADCHNL RV PACING AMPLITUDE: 2.4 V
MDC IDC SET LEADCHNL RV PACING PULSEWIDTH: 0.4 ms
MDC IDC SET LEADCHNL RV SENSING SENSITIVITY: 2.5 mV
Pulse Gen Serial Number: 112476

## 2016-03-06 NOTE — Patient Instructions (Addendum)
Medication Instructions:  Your physician recommends that you continue on your current medications as directed. Please refer to the Current Medication list given to you today.   Labwork: None ordered   Testing/Procedures: Your physician has requested that you have an echocardiogram. Echocardiography is a painless test that uses sound waves to create images of your heart. It provides your doctor with information about the size and shape of your heart and how well your heart's chambers and valves are working. This procedure takes approximately one hour. There are no restrictions for this procedure.     Follow-Up: Your physician wants you to follow-up in: 12 months with Dr Knox Saliva will receive a reminder letter in the mail two months in advance. If you don't receive a letter, please call our office to schedule the follow-up appointment.  Remote monitoring is used to monitor your Pacemaker  from home. This monitoring reduces the number of office visits required to check your device to one time per year. It allows [place] to keep an eye on the functioning of your device to ensure it is working properly. You are scheduled for a device check from home on 06/05/16. You may send your transmission at any time that day. If you have a wireless device, the transmission will be sent automatically. After your physician reviews your transmission, you will receive a postcard with your next transmission date.     Any Other Special Instructions Will Be Listed Below (If Applicable).     If you need a refill on your cardiac medications before your next appointment, please call your pharmacy.

## 2016-03-06 NOTE — Progress Notes (Signed)
HPI Kari [name] returns today for followup. She is a pleasant 65 yo woman wnith symptomatic CHB, s/p PPM insertion, who initially had trouble after her PPM was inserted with palpitations, far field oversensing, and PM wenckebach. Her device was reprogrammed. She has improved. She still has palpitations but is mostly better. No syncope but she has had some dizzy spells, especially when she moves her arms over her head. No chest pain or shortness of breath with exertion. No peripheral edema.  Allergies  Allergen Reactions  . Ceftin Hives    Tolerates amoxicillin  . Levofloxacin Other (See Comments)    "spacey," tolerates Cipro  . Vancomycin Hives     Current Outpatient Prescriptions  Medication Sig Dispense Refill  . aspirin EC 81 MG tablet Take 81 mg by mouth daily.    . cholecalciferol (VITAMIN D) 1000 UNITS tablet Take 1,000 Units by mouth daily.    . Cyanocobalamin (VITAMIN B-12) 2500 MCG SUBL Place 2,500 mcg under the tongue daily.    . fluticasone (FLONASE) 50 MCG/ACT nasal spray Place 1-2 sprays into both nostrils daily.     Marland Kitchen HYDROcodone-acetaminophen (NORCO/VICODIN) 5-325 MG per tablet Take 1-2 tablets by mouth every 4 (four) hours as needed for moderate pain. 40 tablet 0  . Multiple Vitamin (MULTIVITAMIN WITH MINERALS) TABS Take 1 tablet by mouth daily.    . Omega-3 Fatty Acids (EQL OMEGA 3 FISH OIL) 1200 MG CAPS Take 3,600 mg by mouth daily.    . Probiotic Product (PROBIOTIC FORMULA PO) Take 1 capsule by mouth every morning.     . valACYclovir (VALTREX) 1000 MG tablet Take 1,000 mg by mouth daily as needed (herpes zoster).      No current facility-administered medications for this visit.     Past Medical History  Diagnosis Date  . Herpes zoster conjunctivitis   . Menopause   . Seasonal allergies     "spring & fall; not q year" (02/17/2013)  . Mobitz type 2 second degree AV block     2:1/notes 02/16/2013  . Presence of permanent cardiac pacemaker   . Pneumonia 6 1/57yrs  ago  . History of bronchitis Feb 2016  . Joint pain   . Chronic back pain   . Diverticulitis   . Arthritis   . PONV (postoperative nausea and vomiting)     ROS:   All systems reviewed and negative except as noted in the HPI.   Past Surgical History  Procedure Laterality Date  . Arthrogram knee Right 1975  . Insert / replace / remove pacemaker  02/17/2013    [facility] Scientific Advantio dual-chamber pacemaker, model [id number],  . Tonsillectomy  1961  . Cholecystectomy  1990's  . Knee arthroscopy Right 1982, [place]  . Knee arthroscopy w/ acl reconstruction Left 1984  . 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 Trigger finger release Right ~ 2012    "thumb" (02/17/2013)  . Permanent pacemaker insertion N/A 02/17/2013    Procedure: PERMANENT PACEMAKER INSERTION;  Surgeon: Evans Lance, MD;  Location: [facility] CATH LAB;  Service: Cardiovascular;  Laterality: N/A;  . Achilles tendon surgery    . Vaginal hysterectomy  ~ 1994    partial   . Colonscopy    . Esophagogastroduodenoscopy    . Laparoscopic sigmoid colectomy N/A 01/01/2015    Procedure: LAPAROSCOPIC SIGMOID COLECTOMY;  Surgeon: Stark Klein, MD;  Location: [facility];  Service: General;  Laterality: N/A;     Family History  Problem Relation Age of Onset  . Hypertension Mother   . Hyperlipidemia Mother   . Macular degeneration Mother   . COPD Mother   . Lung cancer Father   . Cancer Father     Lung  . COPD Father   . Diabetes Brother   . Breast cancer Paternal Aunt      Social History   Social History  . Marital Status: Married    Spouse Name: N/A  . Number of Children: N/A  . Years of Education: N/A   Occupational History  . RN     [employer]   Social History Main Topics  . Smoking status: Never Smoker   . Smokeless tobacco: Never Used  . Alcohol Use: No  . Drug Use: No  . Sexual Activity: Yes    Birth Control/ Protection: Surgical      Comment: 02/17/2013 "I have a same sex partner"   Other Topics Concern  . Not on file   Social History Narrative     Blood pressure 126/74, pulse 64, height 5 foot 8 inches, weight 170 pounds, body mass index 25.9  Physical Exam:  Well appearing 65 year old woman, NAD HEENT: Unremarkable Neck:  6 cm JVD, no thyromegally Back:  No CVA tenderness Lungs:  Clear with no wheezes, rales, or rhonchi. HEART:  Regular rate rhythm, no murmurs, no rubs, no clicks. Well-healed pacemaker incision Abd:  soft, positive bowel sounds, no organomegally, no rebound, no guarding Ext:  2 plus pulses, no edema, no cyanosis, no clubbing Skin:  No rashes no nodules Neuro:  CN II through XII intact, motor grossly intact  DEVICE  Normal device function except for ventricular noise and atrial far field oversensing.  See PaceArt for details.   Assess/Plan: 1. Ventricular lead noise - the patient has had some symptoms which appear to be related to this. We have reduced her sensitivity. Hopefully this will take care of the problem. 2. Atrial oversensing - she has far field oversensing on the atrial lead. It appears to be appropriately blanked 3. Palpitations - she has some NSVT. Will recheck a 2D echo as I am concerned the myopathic process that affected her conduction system is now affecting her LV. 4. Dyspnea - this is a bit worse though still not severe. Will check the echo. Will hold off on diuretic therapy for now.  Mikle Bosworth.D.

## 2016-03-25 ENCOUNTER — Other Ambulatory Visit (HOSPITAL_COMMUNITY): Payer: 59

## 2016-03-27 MED FILL — HYDROCODON-APAP 5-325: 5-325 | 20 days supply | Qty: 60 | Fill #0

## 2016-03-31 MED FILL — valACYclovir HCL 1 GM TABS: 1 | 30 days supply | Qty: 30 | Fill #1

## 2016-04-17 ENCOUNTER — Ambulatory Visit (HOSPITAL_COMMUNITY): Payer: 59 | Attending: Cardiology

## 2016-04-17 ENCOUNTER — Other Ambulatory Visit: Payer: Self-pay

## 2016-04-17 DIAGNOSIS — I472 Ventricular tachycardia: Secondary | ICD-10-CM | POA: Insufficient documentation

## 2016-04-17 DIAGNOSIS — I4729 Other ventricular tachycardia: Secondary | ICD-10-CM

## 2016-04-17 DIAGNOSIS — R42 Dizziness and giddiness: Secondary | ICD-10-CM | POA: Insufficient documentation

## 2016-04-23 MED FILL — HYDROCODON-APAP 5-325: 5-325 | 20 days supply | Qty: 60 | Fill #0

## 2016-05-09 DIAGNOSIS — M5136 Other intervertebral disc degeneration, lumbar region: Secondary | ICD-10-CM | POA: Diagnosis not present

## 2016-05-09 DIAGNOSIS — M4726 Other spondylosis with radiculopathy, lumbar region: Secondary | ICD-10-CM | POA: Diagnosis not present

## 2016-05-09 DIAGNOSIS — M5441 Lumbago with sciatica, right side: Secondary | ICD-10-CM | POA: Diagnosis not present

## 2016-05-09 DIAGNOSIS — M5416 Radiculopathy, lumbar region: Secondary | ICD-10-CM | POA: Diagnosis not present

## 2016-05-23 MED FILL — HYDROCODON-APAP 5-325: 5-325 | 20 days supply | Qty: 60 | Fill #0

## 2016-06-04 ENCOUNTER — Ambulatory Visit (INDEPENDENT_AMBULATORY_CARE_PROVIDER_SITE_OTHER): Payer: 59 | Admitting: Orthopedic Surgery

## 2016-06-04 DIAGNOSIS — M25512 Pain in left shoulder: Secondary | ICD-10-CM

## 2016-06-06 ENCOUNTER — Other Ambulatory Visit (INDEPENDENT_AMBULATORY_CARE_PROVIDER_SITE_OTHER): Payer: Self-pay | Admitting: Orthopedic Surgery

## 2016-06-06 DIAGNOSIS — M25511 Pain in right shoulder: Secondary | ICD-10-CM

## 2016-06-17 MED FILL — HYDROCODON-APAP 5-325: 5-325 | 20 days supply | Qty: 60 | Fill #0

## 2016-06-18 DIAGNOSIS — M7542 Impingement syndrome of left shoulder: Secondary | ICD-10-CM | POA: Diagnosis not present

## 2016-06-19 ENCOUNTER — Ambulatory Visit (INDEPENDENT_AMBULATORY_CARE_PROVIDER_SITE_OTHER): Payer: 59 | Admitting: Orthopedic Surgery

## 2016-07-04 DIAGNOSIS — M7542 Impingement syndrome of left shoulder: Secondary | ICD-10-CM | POA: Diagnosis not present

## 2016-07-08 ENCOUNTER — Other Ambulatory Visit (HOSPITAL_BASED_OUTPATIENT_CLINIC_OR_DEPARTMENT_OTHER): Payer: Self-pay | Admitting: Obstetrics and Gynecology

## 2016-07-08 DIAGNOSIS — Z1239 Encounter for other screening for malignant neoplasm of breast: Secondary | ICD-10-CM

## 2016-07-15 DIAGNOSIS — M7542 Impingement syndrome of left shoulder: Secondary | ICD-10-CM | POA: Diagnosis not present

## 2016-07-15 DIAGNOSIS — M25512 Pain in left shoulder: Secondary | ICD-10-CM | POA: Diagnosis not present

## 2016-07-16 MED FILL — HYDROCODON-APAP 5-325: 5-325 | 20 days supply | Qty: 60 | Fill #0

## 2016-07-21 ENCOUNTER — Telehealth: Payer: Self-pay | Admitting: Internal Medicine

## 2016-07-21 NOTE — Telephone Encounter (Signed)
New message  Pt call requesting a call back from the RN. Pt states she is suppose to receive a call back. pt did not want to disclose any further information about the call. Please call back to discuss

## 2016-07-21 NOTE — Telephone Encounter (Signed)
Called, spoke with pt. Pt would like to get Dr. Tanna Furry recommendation  on should surgery (left rotator cuff) as outpatient instead of inpatient. Pt has PPM left side. Pt also stated she has not received MyChart msgs about remote transmission since Aug. Will forward to [facility] and Dr.Taylor to advise.

## 2016-07-22 NOTE — Telephone Encounter (Signed)
Called, left voice message to call back. 

## 2016-07-22 NOTE — Telephone Encounter (Signed)
Spoke with pt. Scheduled her a remote transmission for 08-05-16 pt agreed to this. Informed pt that MD nurse has form and that once he signs off on it they will send it back to the doctor doing the surgery. Pt verbalized understanding.

## 2016-07-22 NOTE — Telephone Encounter (Signed)
Sent medical clearance form to medical records to be faxed to [facility] [phone]. Dr. Lovena Le stated - May proceed with surgery. Low risk.

## 2016-08-05 ENCOUNTER — Ambulatory Visit (INDEPENDENT_AMBULATORY_CARE_PROVIDER_SITE_OTHER): Payer: 59 | Admitting: *Deleted

## 2016-08-05 ENCOUNTER — Telehealth: Payer: Self-pay | Admitting: Cardiology

## 2016-08-05 DIAGNOSIS — I441 Atrioventricular block, second degree: Secondary | ICD-10-CM

## 2016-08-05 NOTE — Telephone Encounter (Signed)
LMOVM reminding pt to send remote transmission.   

## 2016-08-06 ENCOUNTER — Other Ambulatory Visit: Payer: Self-pay | Admitting: Orthopaedic Surgery

## 2016-08-06 NOTE — Progress Notes (Signed)
Remote pacemaker transmission.   

## 2016-08-08 DIAGNOSIS — R030 Elevated blood-pressure reading, without diagnosis of hypertension: Secondary | ICD-10-CM | POA: Diagnosis not present

## 2016-08-08 DIAGNOSIS — Z6825 Body mass index (BMI) 25.0-25.9, adult: Secondary | ICD-10-CM | POA: Diagnosis not present

## 2016-08-08 DIAGNOSIS — M542 Cervicalgia: Secondary | ICD-10-CM | POA: Diagnosis not present

## 2016-08-08 DIAGNOSIS — M5412 Radiculopathy, cervical region: Secondary | ICD-10-CM | POA: Diagnosis not present

## 2016-08-08 DIAGNOSIS — M5030 Other cervical disc degeneration, unspecified cervical region: Secondary | ICD-10-CM | POA: Diagnosis not present

## 2016-08-08 DIAGNOSIS — M4722 Other spondylosis with radiculopathy, cervical region: Secondary | ICD-10-CM | POA: Diagnosis not present

## 2016-08-12 MED FILL — HYDROCODON-APAP 5-325: 5-325 | 20 days supply | Qty: 60 | Fill #0

## 2016-08-13 ENCOUNTER — Encounter: Payer: Self-pay | Admitting: Cardiology

## 2016-08-13 ENCOUNTER — Other Ambulatory Visit: Payer: Self-pay | Admitting: Neurosurgery

## 2016-08-13 DIAGNOSIS — M4722 Other spondylosis with radiculopathy, cervical region: Secondary | ICD-10-CM

## 2016-09-01 LAB — CUP PACEART REMOTE DEVICE CHECK
Battery Remaining Longevity: 114 mo
Brady Statistic RA Percent Paced: 10 %
Implantable Lead Implant Date: 20140626
Implantable Lead Location: 753859
Implantable Lead Model: 5076
Implantable Lead Model: 5076
Implantable Pulse Generator Implant Date: 20140626
Lead Channel Impedance Value: 396 Ohm
Lead Channel Impedance Value: 442 Ohm
Lead Channel Pacing Threshold Amplitude: 0.5 V
Lead Channel Pacing Threshold Pulse Width: 0.5 ms
Lead Channel Setting Pacing Amplitude: 2 V
Lead Channel Setting Sensing Sensitivity: 3.5 mV
MDC IDC LEAD IMPLANT DT: 20140626
MDC IDC LEAD LOCATION: 753860
MDC IDC MSMT BATTERY REMAINING PERCENTAGE: 100 %
MDC IDC SESS DTM: 20171212230900
MDC IDC SET LEADCHNL RV PACING AMPLITUDE: 2.4 V
MDC IDC SET LEADCHNL RV PACING PULSEWIDTH: 0.4 ms
MDC IDC STAT BRADY RV PERCENT PACED: 99 %
Pulse Gen Serial Number: 112476

## 2016-09-02 NOTE — Progress Notes (Signed)
Chart reviewed with Dr Lissa Hoard. Patient scheduled for Left shoulder scope and rotator cuff repair by Dr Rhona Raider. Pt has pacemaker in left chest and will need to be interrogated after surgery. Needs to be done at main OR.

## 2016-09-03 ENCOUNTER — Encounter (HOSPITAL_BASED_OUTPATIENT_CLINIC_OR_DEPARTMENT_OTHER): Payer: Self-pay | Admitting: *Deleted

## 2016-09-03 NOTE — H&P (Signed)
Kari [name] is an 66 y.o. female.   Chief Complaint: Left shoulder pain HPI: Kari [name] is not doing well. She got some temporary relief with the subacromial injection but her pain is back. She has trouble reaching up and out and cannot sleep.  She cannot have an MRI scan as she has an implanted pacemaker.  Imaging/Tests: We took 3 views of the left shoulder.  She has a type IIb acromion with no glenohumeral degenerative change.  Past Medical History:  Diagnosis Date  . Arthritis   . Chronic back pain   . Diverticulitis   . Herpes zoster conjunctivitis   . History of bronchitis Feb 2016  . Joint pain   . Menopause   . Mobitz type 2 second degree AV block    2:1/notes 02/16/2013  . Pneumonia 6 1/74yrs ago  . PONV (postoperative nausea and vomiting)   . Presence of permanent cardiac pacemaker   . Seasonal allergies    "spring & fall; not q year" (02/17/2013)    Past Surgical History:  Procedure Laterality Date  . ACHILLES TENDON SURGERY    . ARTHROGRAM KNEE Right 1975  . CHOLECYSTECTOMY  1990's  . colonscopy    . ESOPHAGOGASTRODUODENOSCOPY    . INSERT / REPLACE / REMOVE PACEMAKER  02/17/2013   [employer] Advantio dual-chamber pacemaker, model [id number],  . KNEE ARTHROSCOPY Right 1982, 1983, 1995, 1996  . KNEE ARTHROSCOPY W/ ACL RECONSTRUCTION Left 1984  . LAPAROSCOPIC SIGMOID COLECTOMY N/A 01/01/2015   Procedure: LAPAROSCOPIC SIGMOID COLECTOMY;  Surgeon: Stark Klein, MD;  Location: [facility];  Service: General;  Laterality: N/A;  . PERMANENT PACEMAKER INSERTION N/A 02/17/2013   Procedure: PERMANENT PACEMAKER INSERTION;  Surgeon: Evans Lance, MD;  Location[facility] C[facility] LAB;  Service: Cardiovascular;  Laterality: N/A;  . TONSILLECTOMY  1961  . TOTAL KNEE ARTHROPLASTY Right 2000; 06/2000   "replaced w/appropriate hardware" (02/17/2013)  . TRIGGER FINGER RELEASE Right ~ 2012   "thumb" (02/17/2013)  . VAGINAL HYSTERECTOMY  ~ 1994   partial     Family History  Problem Relation  Age of Onset  . Hypertension Mother   . Hyperlipidemia Mother   . Macular degeneration Mother   . COPD Mother   . Lung cancer Father   . Cancer Father     Lung  . COPD Father   . Diabetes Brother   . Breast cancer Paternal Aunt    Social History:  reports that she has never smoked. She has never used smokeless tobacco. She reports that she does not drink alcohol or use drugs.  Allergies:  Allergies  Allergen Reactions  . Ceftin Hives    Tolerates amoxicillin  . Vancomycin Hives  . Levofloxacin Other (See Comments)    "spacey," tolerates Cipro    No prescriptions prior to admission.    No results found for this or any previous visit (from the past 48 hour(s)). No results found.  Review of Systems  Musculoskeletal: Positive for joint pain.       Left shoulder  All other systems reviewed and are negative.   There were no vitals taken for this visit. Physical Exam  Constitutional: She is oriented to person, place, and time. She appears well-developed and well-nourished.  HENT:  Head: Normocephalic and atraumatic.  Eyes: Pupils are equal, round, and reactive to light.  Neck: Normal range of motion.  Cardiovascular: Normal rate.   Respiratory: Effort normal.  GI: Soft.  Musculoskeletal:  Left shoulder motion is nearly full. She has  very painful primary and secondary impingement. I do not get much pain at her [facility] joint. Cuff strength is good though very painful resisted external rotation. Cervical motion is good and there is no palpable lymphadenopathy.   Neurological: She is alert and oriented to person, place, and time.  Skin: Skin is warm and dry.  Psychiatric: She has a normal mood and affect. Her behavior is normal. Judgment and thought content normal.     Assessment/Plan Assessment: Left shoulder impingement, possible rotator cuff tear, injected 06/18/16  Plan: Kari [name] is struggling terribly. She cannot sleep and has trouble using her arm. He has tried a cortisone  injection for any significant relief.  We have discussed the risks and benefits of surgery including complications such as blood clot, infection, and anesthesia. She understands elected to proceed with surgery. She will likely need physical therapy after this to help her recover.  Powell Halbert, Larwance Sachs, PA-C 09/03/2016, 11:33 AM

## 2016-09-04 ENCOUNTER — Encounter (HOSPITAL_COMMUNITY): Payer: Self-pay | Admitting: *Deleted

## 2016-09-04 NOTE — Progress Notes (Signed)
Pt denies SOB and chest pain but is under the care of Dr. Cristopher Peru, Cardiology. Pt denies having a cardiac cath. Pt denies having an EKG and chest x ray within the last 12 months. Pt made aware to stop taking vitamins, fish oil, Probiotics and herbal medications ( Melatonin). Do not take any NSAIDs ie: Ibuprofen, Advil, Naproxen, BC and Goody Powder. Pt stated that she was not instructed to stop taking Aspirin. Pt verbalized understanding of all pre-op instructions. Joey, [place], made aware of Peri-op Prescription for ICD; Joey stated that he wanted to be called 1 hour prior to procedure if needed at [phone]. Anesthesia asked to review pt cardiac history ( see note).

## 2016-09-04 NOTE — Progress Notes (Signed)
Anesthesia Chart Review: SAME DAY WORK-UP.  Pt is 66 year old female scheduled for left shoulder arthroscopy and repair on 09/05/2016 by Dr. Rhona Raider. Case was initially scheduled for [facility], but was moved to main OR by anesthesiologist Dr. Lissa Hoard because patient has a PPM that will need to be interrogated following surgery.   Cardiologist is Dr. Cristopher Peru, last visit 03/06/16. He did reduce PPM sensitivity due to ventricular lead noise. He also ordered an echo (see below) due to dyspnea and some NSVT on PPM interrogation. By 07/22/16 note by Briant Cedar, LPN, "Dr. Lovena Le stated - May proceed with surgery. Low risk."   PCP is listed as Juanda Chance, NP.    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 Never smoker. BMI 25.   Echo 04/17/2016: Impressions: - Normal LV size with EF 55-60%. Normal RV size and systolic   function. No significant valvular abnormalities.  Nuclear stress test 04/26/2013: Low risk stress nuclear study. There is a small fixed defect of the distal anterior wall that may be due to breast artifact. The distal anterior wall contracts normally. LV Ejection Fraction: 65%. LV Wall Motion: NL LV Function; NL Wall Motion.  She will need labs on arrival. Last EKG is > 16 year old, so this will need to be done on arrival as well. Perioperative pacemaker device prescription is still pending from [facility]. Last transmisstion 08/05/16.  If labs acceptable and no acute changes then I anticipate pt can proceed with surgery as scheduled.   George Hugh [facility] Short Stay Center/Anesthesiology Phone [phone] 09/04/2016 10:30 AM

## 2016-09-05 ENCOUNTER — Encounter (HOSPITAL_COMMUNITY)
Admission: RE | Disposition: A | Discharge status: Home / Self Care | Payer: Self-pay | Source: Ambulatory Visit | Attending: Orthopaedic Surgery

## 2016-09-05 ENCOUNTER — Ambulatory Visit (HOSPITAL_COMMUNITY)
Admission: RE | Admit: 2016-09-05 | Discharge: 2016-09-05 | Disposition: A | Discharge status: Home / Self Care | Payer: 59 | Source: Ambulatory Visit | Attending: Orthopaedic Surgery | Admitting: Orthopaedic Surgery

## 2016-09-05 ENCOUNTER — Ambulatory Visit (HOSPITAL_COMMUNITY): Payer: 59 | Admitting: Vascular Surgery

## 2016-09-05 ENCOUNTER — Encounter (HOSPITAL_COMMUNITY): Payer: Self-pay | Admitting: Anesthesiology

## 2016-09-05 DIAGNOSIS — G8918 Other acute postprocedural pain: Secondary | ICD-10-CM | POA: Diagnosis not present

## 2016-09-05 DIAGNOSIS — Z96651 Presence of right artificial knee joint: Secondary | ICD-10-CM | POA: Diagnosis not present

## 2016-09-05 DIAGNOSIS — M7542 Impingement syndrome of left shoulder: Secondary | ICD-10-CM | POA: Insufficient documentation

## 2016-09-05 DIAGNOSIS — K219 Gastro-esophageal reflux disease without esophagitis: Secondary | ICD-10-CM | POA: Insufficient documentation

## 2016-09-05 DIAGNOSIS — Z803 Family history of malignant neoplasm of breast: Secondary | ICD-10-CM | POA: Insufficient documentation

## 2016-09-05 DIAGNOSIS — Z950 Presence of cardiac pacemaker: Secondary | ICD-10-CM | POA: Insufficient documentation

## 2016-09-05 DIAGNOSIS — Z801 Family history of malignant neoplasm of trachea, bronchus and lung: Secondary | ICD-10-CM | POA: Insufficient documentation

## 2016-09-05 DIAGNOSIS — Z90710 Acquired absence of both cervix and uterus: Secondary | ICD-10-CM | POA: Diagnosis not present

## 2016-09-05 DIAGNOSIS — Z8249 Family history of ischemic heart disease and other diseases of the circulatory system: Secondary | ICD-10-CM | POA: Diagnosis not present

## 2016-09-05 DIAGNOSIS — Z881 Allergy status to other antibiotic agents status: Secondary | ICD-10-CM | POA: Diagnosis not present

## 2016-09-05 DIAGNOSIS — Z9049 Acquired absence of other specified parts of digestive tract: Secondary | ICD-10-CM | POA: Diagnosis not present

## 2016-09-05 DIAGNOSIS — Z833 Family history of diabetes mellitus: Secondary | ICD-10-CM | POA: Diagnosis not present

## 2016-09-05 DIAGNOSIS — I441 Atrioventricular block, second degree: Secondary | ICD-10-CM | POA: Diagnosis not present

## 2016-09-05 DIAGNOSIS — M75122 Complete rotator cuff tear or rupture of left shoulder, not specified as traumatic: Secondary | ICD-10-CM | POA: Diagnosis not present

## 2016-09-05 DIAGNOSIS — M1990 Unspecified osteoarthritis, unspecified site: Secondary | ICD-10-CM | POA: Insufficient documentation

## 2016-09-05 DIAGNOSIS — Z825 Family history of asthma and other chronic lower respiratory diseases: Secondary | ICD-10-CM | POA: Diagnosis not present

## 2016-09-05 DIAGNOSIS — G8929 Other chronic pain: Secondary | ICD-10-CM | POA: Insufficient documentation

## 2016-09-05 DIAGNOSIS — K5792 Diverticulitis of intestine, part unspecified, without perforation or abscess without bleeding: Secondary | ICD-10-CM | POA: Insufficient documentation

## 2016-09-05 DIAGNOSIS — M549 Dorsalgia, unspecified: Secondary | ICD-10-CM | POA: Insufficient documentation

## 2016-09-05 DIAGNOSIS — M75102 Unspecified rotator cuff tear or rupture of left shoulder, not specified as traumatic: Secondary | ICD-10-CM | POA: Diagnosis not present

## 2016-09-05 HISTORY — DX: Impingement syndrome of unspecified shoulder: M75.40

## 2016-09-05 HISTORY — DX: Other specified joint disorders, unspecified shoulder: M25.819

## 2016-09-05 HISTORY — PX: SHOULDER ARTHROSCOPY: SHX128

## 2016-09-05 LAB — CBC
HEMATOCRIT: 41 % (ref 36.0–46.0)
HEMOGLOBIN: 14.2 g/dL (ref 12.0–15.0)
MCH: 31.1 pg (ref 26.0–34.0)
MCHC: 34.6 g/dL (ref 30.0–36.0)
MCV: 89.9 fL (ref 78.0–100.0)
Platelets: 168 10*3/uL (ref 150–400)
RBC: 4.56 MIL/uL (ref 3.87–5.11)
RDW: 14 % (ref 11.5–15.5)
WBC: 5.4 10*3/uL (ref 4.0–10.5)

## 2016-09-05 SURGERY — ARTHROSCOPY, SHOULDER
Anesthesia: General | Site: Shoulder | Laterality: Left

## 2016-09-05 MED ORDER — LIDOCAINE HCL (CARDIAC) 20 MG/ML IV SOLN
INTRAVENOUS | Status: DC | PRN
  Administered 2016-09-05: 60 mg via INTRAVENOUS

## 2016-09-05 MED ORDER — KETOROLAC TROMETHAMINE 30 MG/ML IJ SOLN
INTRAMUSCULAR | Status: DC | PRN
  Administered 2016-09-05 (×2): 15 mg via INTRAVENOUS

## 2016-09-05 MED ORDER — HYDROCODONE-ACETAMINOPHEN 5-325 MG PO TABS: 1.0000 | ORAL_TABLET | ORAL | 0 refills | Status: DC | PRN

## 2016-09-05 MED ORDER — BUPIVACAINE HCL (PF) 0.25 % IJ SOLN
INTRAMUSCULAR | Status: AC
  Filled 2016-09-05: qty 30

## 2016-09-05 MED ORDER — SUGAMMADEX SODIUM 200 MG/2ML IV SOLN
INTRAVENOUS | Status: AC
  Filled 2016-09-05: qty 2

## 2016-09-05 MED ORDER — KETOROLAC TROMETHAMINE 30 MG/ML IJ SOLN
INTRAMUSCULAR | Status: AC
  Filled 2016-09-05: qty 1

## 2016-09-05 MED ORDER — MIDAZOLAM HCL 5 MG/5ML IJ SOLN
INTRAMUSCULAR | Status: DC | PRN
  Administered 2016-09-05: 2 mg via INTRAVENOUS

## 2016-09-05 MED ORDER — LACTATED RINGERS IV SOLN
INTRAVENOUS | Status: DC
  Administered 2016-09-05 (×2): via INTRAVENOUS

## 2016-09-05 MED ORDER — MIDAZOLAM HCL 2 MG/2ML IJ SOLN
INTRAMUSCULAR | Status: AC
  Filled 2016-09-05: qty 2

## 2016-09-05 MED ORDER — CEFAZOLIN SODIUM-DEXTROSE 2-3 GM-% IV SOLR
2.0000 g | Freq: Once | INTRAVENOUS | Status: AC
  Administered 2016-09-05: 2 g via INTRAVENOUS
  Filled 2016-09-05: qty 50

## 2016-09-05 MED ORDER — 0.9 % SODIUM CHLORIDE (POUR BTL) OPTIME
TOPICAL | Status: DC | PRN
  Administered 2016-09-05: 1000 mL

## 2016-09-05 MED ORDER — FENTANYL CITRATE (PF) 100 MCG/2ML IJ SOLN
INTRAMUSCULAR | Status: AC
  Filled 2016-09-05: qty 2

## 2016-09-05 MED ORDER — BUPIVACAINE-EPINEPHRINE (PF) 0.5% -1:200000 IJ SOLN
INTRAMUSCULAR | Status: DC | PRN
  Administered 2016-09-05: 25 mL

## 2016-09-05 MED ORDER — ONDANSETRON HCL 4 MG/2ML IJ SOLN
INTRAMUSCULAR | Status: DC | PRN
Start: 2016-09-05 — End: 2016-09-05
  Administered 2016-09-05: 4 mg via INTRAVENOUS

## 2016-09-05 MED ORDER — DEXAMETHASONE SODIUM PHOSPHATE 10 MG/ML IJ SOLN
INTRAMUSCULAR | Status: DC | PRN
  Administered 2016-09-05: 10 mg via INTRAVENOUS

## 2016-09-05 MED ORDER — DIPHENHYDRAMINE HCL 50 MG/ML IJ SOLN
INTRAMUSCULAR | Status: AC
  Filled 2016-09-05: qty 1

## 2016-09-05 MED ORDER — PROPOFOL 10 MG/ML IV BOLUS
INTRAVENOUS | Status: DC | PRN
  Administered 2016-09-05: 200 mg via INTRAVENOUS

## 2016-09-05 MED ORDER — ROCURONIUM BROMIDE 100 MG/10ML IV SOLN
INTRAVENOUS | Status: DC | PRN
  Administered 2016-09-05: 40 mg via INTRAVENOUS

## 2016-09-05 MED ORDER — VANCOMYCIN HCL IN DEXTROSE 1-5 GM/200ML-% IV SOLN: 1000.0000 mg | INTRAVENOUS | Status: DC

## 2016-09-05 MED ORDER — SODIUM CHLORIDE 0.9 % IR SOLN
Status: DC | PRN
  Administered 2016-09-05 (×3): 3000 mL

## 2016-09-05 MED ORDER — DEXTROSE 5 % IV SOLN
INTRAVENOUS | Status: DC | PRN
  Administered 2016-09-05: 20 ug/min via INTRAVENOUS

## 2016-09-05 MED ORDER — DEXAMETHASONE SODIUM PHOSPHATE 10 MG/ML IJ SOLN
INTRAMUSCULAR | Status: AC
  Filled 2016-09-05: qty 1

## 2016-09-05 MED ORDER — CEFAZOLIN SODIUM-DEXTROSE 2-4 GM/100ML-% IV SOLN
INTRAVENOUS | Status: AC
  Filled 2016-09-05: qty 100

## 2016-09-05 MED ORDER — SUGAMMADEX SODIUM 200 MG/2ML IV SOLN
INTRAVENOUS | Status: DC | PRN
  Administered 2016-09-05: 150 mg via INTRAVENOUS

## 2016-09-05 MED ORDER — DIPHENHYDRAMINE HCL 50 MG/ML IJ SOLN
INTRAMUSCULAR | Status: DC | PRN
  Administered 2016-09-05: 12.5 mg via INTRAVENOUS

## 2016-09-05 MED ORDER — ONDANSETRON HCL 4 MG/2ML IJ SOLN
INTRAMUSCULAR | Status: AC
  Filled 2016-09-05: qty 2

## 2016-09-05 MED ORDER — CHLORHEXIDINE GLUCONATE 4 % EX LIQD: 60.0000 mL | Freq: Once | CUTANEOUS | Status: DC

## 2016-09-05 MED ORDER — FENTANYL CITRATE (PF) 100 MCG/2ML IJ SOLN
INTRAMUSCULAR | Status: AC
  Administered 2016-09-05: 100 ug
  Filled 2016-09-05: qty 2

## 2016-09-05 MED ORDER — MIDAZOLAM HCL 2 MG/2ML IJ SOLN
INTRAMUSCULAR | Status: AC
  Administered 2016-09-05: 2 mg
  Filled 2016-09-05: qty 2

## 2016-09-05 MED FILL — ONDANSETRON HCL 4 MG TABLET: 4 | 4 days supply | Qty: 30 | Fill #0

## 2016-09-05 MED FILL — HYDROCODON-APAP 5-325: 5-325 | 3 days supply | Qty: 30 | Fill #0

## 2016-09-05 SURGICAL SUPPLY — 51 items
ANCH SUT 4.5 SUBCORTICAL WNG (Anchor) ×1 IMPLANT
ANCHOR SUT POPLOK 4.5 (Anchor) ×1 IMPLANT
ANCHOR SUT POPLOK 4.5MM (Anchor) ×1 IMPLANT
BLADE GREAT WHITE 4.2 (BLADE) ×2 IMPLANT
BLADE GREAT WHITE 4.2MM (BLADE) ×1
BUR VERTEX HOODED 4.5 (BURR) ×3 IMPLANT
CANNULA SHOULDER 7CM (CANNULA) ×5 IMPLANT
CANNULA TWIST IN 8.25X7CM (CANNULA) ×2 IMPLANT
COVER SURGICAL LIGHT HANDLE (MISCELLANEOUS) ×2 IMPLANT
DRAPE ORTHO SPLIT 77X108 STRL (DRAPES) ×6
DRAPE STERI 35X30 U-POUCH (DRAPES) ×3 IMPLANT
DRAPE SURG ORHT 6 SPLT 77X108 (DRAPES) ×2 IMPLANT
DRAPE U-SHAPE 47X51 STRL (DRAPES) ×3 IMPLANT
DRSG EMULSION OIL 3X3 NADH (GAUZE/BANDAGES/DRESSINGS) ×3 IMPLANT
DRSG PAD ABDOMINAL 8X10 ST (GAUZE/BANDAGES/DRESSINGS) ×3 IMPLANT
DURAPREP 26ML APPLICATOR (WOUND CARE) ×3 IMPLANT
GAUZE SPONGE 4X4 12PLY STRL (GAUZE/BANDAGES/DRESSINGS) ×3 IMPLANT
GLOVE BIO SURGEON STRL SZ8 (GLOVE) ×3 IMPLANT
GLOVE BIOGEL PI IND STRL 8 (GLOVE) ×2 IMPLANT
GLOVE BIOGEL PI INDICATOR 8 (GLOVE) ×4
GLOVE SS N UNI LF 8.0 STRL (GLOVE) ×3 IMPLANT
GOWN STRL REUS W/ TWL LRG LVL3 (GOWN DISPOSABLE) ×1 IMPLANT
GOWN STRL REUS W/ TWL XL LVL3 (GOWN DISPOSABLE) ×2 IMPLANT
GOWN STRL REUS W/TWL LRG LVL3 (GOWN DISPOSABLE) ×3
GOWN STRL REUS W/TWL XL LVL3 (GOWN DISPOSABLE) ×6
KIT BASIN OR (CUSTOM PROCEDURE TRAY) ×3 IMPLANT
KIT ROOM TURNOVER OR (KITS) ×3 IMPLANT
MANIFOLD NEPTUNE II (INSTRUMENTS) ×3 IMPLANT
NDL SCORPION MULTI FIRE (NEEDLE) IMPLANT
NEEDLE 22X1 1/2 (OR ONLY) (NEEDLE) IMPLANT
NEEDLE SCORPION MULTI FIRE (NEEDLE) ×3 IMPLANT
NS IRRIG 1000ML POUR BTL (IV SOLUTION) ×3 IMPLANT
PACK ARTHROSCOPY DSU (CUSTOM PROCEDURE TRAY) ×3 IMPLANT
PAD ARMBOARD 7.5X6 YLW CONV (MISCELLANEOUS) ×6 IMPLANT
SET ARTHROSCOPY TUBING (MISCELLANEOUS) ×3
SET ARTHROSCOPY TUBING LN (MISCELLANEOUS) ×1 IMPLANT
SLING ARM FOAM STRAP LRG (SOFTGOODS) ×2 IMPLANT
SPONGE GAUZE 4X4 12PLY STER LF (GAUZE/BANDAGES/DRESSINGS) ×2 IMPLANT
SPONGE LAP 4X18 X RAY DECT (DISPOSABLE) ×3 IMPLANT
SUT ETHILON 3 0 PS 1 (SUTURE) ×3 IMPLANT
SUT HI-FI 2 STRAND C-2 40 (SUTURE) ×2 IMPLANT
SUT PDS AB 1 CT  36 (SUTURE) ×2
SUT PDS AB 1 CT 36 (SUTURE) IMPLANT
SYR CONTROL 10ML LL (SYRINGE) IMPLANT
TAPE CLOTH SURG 6X10 WHT LF (GAUZE/BANDAGES/DRESSINGS) ×2 IMPLANT
TOWEL OR 17X24 6PK STRL BLUE (TOWEL DISPOSABLE) ×3 IMPLANT
TOWEL OR 17X26 10 PK STRL BLUE (TOWEL DISPOSABLE) ×3 IMPLANT
TUBE CONNECTING 12'X1/4 (SUCTIONS) ×1
TUBE CONNECTING 12X1/4 (SUCTIONS) ×2 IMPLANT
WAND HAND CNTRL MULTIVAC 90 (MISCELLANEOUS) ×3 IMPLANT
WATER STERILE IRR 1000ML POUR (IV SOLUTION) ×3 IMPLANT

## 2016-09-05 NOTE — Anesthesia Postprocedure Evaluation (Addendum)
Anesthesia Post Note  Patient: [place]  Procedure(s) Performed: Procedure(s) (LRB): LEFT SHOULDER ARTHROSCOPY , ACROMIOPLASTY AND ROTATOR CUFF REPAIR (Left)  Patient location during evaluation: PACU Anesthesia Type: General and Regional Level of consciousness: sedated, awake and oriented Pain management: pain level controlled Vital Signs Assessment: post-procedure vital signs reviewed and stable Respiratory status: spontaneous breathing, nonlabored ventilation, respiratory function stable and patient connected to nasal cannula oxygen Cardiovascular status: blood pressure returned to baseline and stable (pacemaker interrogated and reeturned to origianal) Postop Assessment: no signs of nausea or vomiting Anesthetic complications: no       Last Vitals:  Vitals:   09/05/16 1115 09/05/16 1436  BP:  (!) 130/59  Pulse: 75   Resp:  16  Temp:  36.4 C    Last Pain:  Vitals:   09/05/16 1436  TempSrc:   PainSc: 0-No pain                 Irelyn Perfecto,JAMES TERRILL

## 2016-09-05 NOTE — Anesthesia Procedure Notes (Addendum)
Anesthesia Regional Block:  Interscalene brachial plexus block  Pre-Anesthetic Checklist: ,, timeout performed, Correct Patient, Correct Site, Correct Laterality, Correct Procedure, Correct Position, site marked, Risks and benefits discussed,  Surgical consent,  Pre-op evaluation,  At surgeon's request and post-op pain management  Laterality: Upper and Left  Prep: Betadine, chloraprep       Needles:  Injection technique: Single-shot  Needle Type: Stimulator Needle - 40     Needle Length: 4cm 4 cm Needle Gauge: 21 and 21 G  Needle insertion depth: 3 cm   Additional Needles:  Procedures: ultrasound guided (picture in chart) and nerve stimulator Interscalene brachial plexus block  Nerve Stimulator or Paresthesia:  Response: Twitch elicited, 0.5 mA, 0.3 ms,   Additional Responses:   Narrative:  Start time: 09/05/2016 12:00 PM End time: 09/05/2016 12:15 PM Injection made incrementally with aspirations every 5 mL.  Performed by: Personally  Anesthesiologist: Lamont Glasscock  Additional Notes: Block assessed prior to start of surgery

## 2016-09-05 NOTE — Anesthesia Preprocedure Evaluation (Addendum)
Anesthesia Evaluation  Patient identified by MRN, date of birth, ID band Patient awake    Reviewed: Allergy & Precautions, NPO status , Patient's Chart, lab work & pertinent test results  History of Anesthesia Complications (+) PONV and history of anesthetic complications  Airway Mallampati: II  TM Distance: >3 FB Neck ROM: Full    Dental  (+) Dental Advisory Given, Teeth Intact   Pulmonary    breath sounds clear to auscultation       Cardiovascular + dysrhythmias + pacemaker  Rhythm:Regular Rate:Normal  Hx of HB c pacemaker   normal. Systolic function was normal. The estimated ejection   fraction was in the range of 55% to 60%. Wall motion was normal;   there were no regional wall motion abnormalities. Doppler   parameters are consistent with abnormal left ventricular   relaxation (grade 1 diastolic dysfunction).   Neuro/Psych  Neuromuscular disease    GI/Hepatic Neg liver ROS, GERD  ,  Endo/Other    Renal/GU negative Renal ROS     Musculoskeletal  (+) Arthritis ,   Abdominal   Peds  Hematology   Anesthesia Other Findings   Reproductive/Obstetrics                          Anesthesia Physical Anesthesia Plan  ASA: III  Anesthesia Plan: General   Post-op Pain Management:  Regional for Post-op pain   Induction:   Airway Management Planned: Oral ETT  Additional Equipment:   Intra-op Plan:   Post-operative Plan: Extubation in OR  Informed Consent: I have reviewed the patients History and Physical, chart, labs and discussed the procedure including the risks, benefits and alternatives for the proposed anesthesia with the patient or authorized representative who has indicated his/her understanding and acceptance.   Dental advisory given  Plan Discussed with: CRNA  Anesthesia Plan Comments:         Anesthesia Quick Evaluation

## 2016-09-05 NOTE — Interval H&P Note (Signed)
History and Physical Interval Note:  09/05/2016 12:28 PM  Heeia  has presented today for surgery, with the diagnosis of LEFT SHOULDER IMPINGEMENT AND ROTATOR CUFF TEAR  The various methods of treatment have been discussed with the patient and family. After consideration of risks, benefits and other options for treatment, the patient has consented to  Procedure(s): ARTHROSCOPY SHOULDER AND REPAIR (Left) as a surgical intervention .  The patient's history has been reviewed, patient examined, no change in status, stable for surgery.  I have reviewed the patient's chart and labs.  Questions were answered to the patient's satisfaction.     Kyshawn Teal G

## 2016-09-05 NOTE — Anesthesia Procedure Notes (Signed)
Procedure Name: Intubation Date/Time: 09/05/2016 12:53 PM Performed by: Jenne Campus Pre-anesthesia Checklist: Patient identified, Emergency Drugs available, Suction available and Patient being monitored Patient Re-evaluated:Patient Re-evaluated prior to inductionOxygen Delivery Method: Circle System Utilized Preoxygenation: Pre-oxygenation with 100% oxygen Intubation Type: IV induction Ventilation: Mask ventilation without difficulty Laryngoscope Size: Miller and 2 Grade View: Grade II Tube type: Oral Tube size: 7.0 mm Number of attempts: 1 Airway Equipment and Method: Stylet and Oral airway Placement Confirmation: ETT inserted through vocal cords under direct vision,  positive ETCO2 and breath sounds checked- equal and bilateral Secured at: 22 cm Tube secured with: Tape Dental Injury: Teeth and Oropharynx as per pre-operative assessment  Comments: Smooth Iv induction. EZ mask. DL x 1 CRNA. Grade 2 view. Unable to direct ETT through cords. DL x 1 Dr Orene Desanctis. Grade 2 view. Atrau intub. +ETCO2 bbse.

## 2016-09-05 NOTE — Transfer of Care (Signed)
Immediate Anesthesia Transfer of Care Note  Patient: Kari [name]  Procedure(s) Performed: Procedure(s): LEFT SHOULDER ARTHROSCOPY , ACROMIOPLASTY AND ROTATOR CUFF REPAIR (Left)  Patient Location: PACU  Anesthesia Type:General  Level of Consciousness: awake, oriented and patient cooperative  Airway & Oxygen Therapy: Patient Spontanous Breathing and Patient connected to nasal cannula oxygen  Post-op Assessment: Report given to RN and Post -op Vital signs reviewed and stable  Post vital signs: Reviewed  Last Vitals:  Vitals:   09/05/16 1031 09/05/16 1115  BP: (!) 168/93   Pulse: 81 75  Resp: 20   Temp: 36.6 C     Last Pain:  Vitals:   09/05/16 1031  TempSrc: Oral         Complications: No apparent anesthesia complications

## 2016-09-05 NOTE — Op Note (Signed)
#  [id number] 

## 2016-09-06 NOTE — Op Note (Deleted)
  The note originally documented on this encounter has been moved the the encounter in which it belongs.  

## 2016-09-06 NOTE — Op Note (Signed)
NAMEJAIEL, HOWER           ACCOUNT NO.:  [id number]  MEDICAL RECORD NO.:  [id number]  LOCATION:                                 FACILITY:  PHYSICIAN:  Monico Blitz. Akbar Sacra, M.D.DATE OF BIRTH:  10-Jan-1951  DATE OF PROCEDURE:  09/05/2016 DATE OF DISCHARGE:  09/05/2016                              OPERATIVE REPORT   PREOPERATIVE DIAGNOSES: 1. Left shoulder impingement. 2. Left shoulder rotator cuff tear.  POSTOPERATIVE DIAGNOSES: 1. Left shoulder impingement. 2. Left shoulder rotator cuff tear.  PROCEDURES: 1. Left shoulder arthroscopic acromioplasty. 2. Left shoulder arthroscopic debridement. 3. Left shoulder arthroscopic rotator cuff repair.  ANESTHESIA:  General and block.  ATTENDING SURGEON:  Monico Blitz. Rhona Raider, M.D.  ASSISTANT:  Loni Dolly, P.A.  INDICATION FOR PROCEDURE:  The patient is a 66 year old woman with many months of extreme left shoulder pain.  This has persisted despite injections and therapy and antiinflammatories.  By ultrasound study, she has things suggestive of a full-thickness rotator cuff tear.  With continued difficulty resting and using her arm, she is offered an arthroscopy.  Informed operative consent was obtained after discussion of possible complications including reaction to anesthesia and infection.  SUMMARY OF FINDINGS AND PROCEDURE:  Under general anesthesia and a block, a left shoulder arthroscopy was performed.  Glenohumeral joint showed a small area of central degenerative change in the glenoid.  The biceps tendon looked completely normal.  She had a portion of the infraspinatus that was nearly completely torn off the greater tuberosity, seen best from below.  I performed an acromioplasty followed by a right rotator cuff repair using 2 inverted mattress sutures and 1 PopLock anchor.  Loni Dolly assisted throughout and was invaluable to the completion of the case, mostly in that he passed instruments and made this possible in an  arthroscopic fashion.  DESCRIPTION OF PROCEDURE:  The patient was taken to operating suite where general anesthetic was applied without difficulty.  She was also given a block in the pre-anesthesia area.  She was positioned in a beach- chair position and prepped and draped in normal sterile fashion.  After the administration of preop IV Kefzol and an appropriate time out, an arthroscopy of the left shoulder was performed through total of 3 portals.  Findings were as noted above and procedure consisted of a thorough debridement of a degenerative area on the glenoid followed by debridement of the bursal aspect of the cuff to expose the full- thickness tear.  I then performed an acromioplasty with the bur in the lateral position followed by transfer of the bur to the posterior position.  We then performed a rotator cuff repair.  I created a bleeding bed of bone with a bur and then passed 2 inverted mattress sutures of hi-fi suture.  These were then secured to the greater tuberosity in the area of our intended repair with a single PopLock anchor by [employer].  The shoulder was then irrigated followed by reapproximation of portals loosely with nylon.  Adaptic was placed over the portals followed by dry gauze and tape.  ESTIMATED BLOOD LOSS AND FLUIDS:  Can be obtained from Anesthesia records.  DISPOSITION:  The patient was extubated in operating room and taken to  recovery in stable condition.  She was to go home same-day and follow up in the office closely.  I will contact her by phone tonight.     Monico Blitz Rhona Raider, M.D.   ______________________________ Monico Blitz. Rhona Raider, M.D.    PGD/MEDQ  D:  09/05/2016  T:  09/06/2016  Job:  [id number]

## 2016-09-08 ENCOUNTER — Telehealth: Payer: Self-pay | Admitting: Internal Medicine

## 2016-09-08 ENCOUNTER — Encounter (HOSPITAL_COMMUNITY): Payer: Self-pay | Admitting: Orthopaedic Surgery

## 2016-09-08 NOTE — Telephone Encounter (Signed)
Spoke w/ pt and informed her that the "abnormal" remote was not anything new. Its been documented and Dr. Lovena Le is aware that there is noise noted seen on the leads, the [facility] were the device seeing the noise on the leads and classifying them as VHR. Pt voiced understanding and stated that it was concerning when she logged into my-chart and seen an abnormal result with no follow-up or recommendations that she could see from Dr. Lovena Le. Informed pt that I would inquire if the fact that Dr. Tanna Furry interpretation could be viewed on pts side.

## 2016-09-08 NOTE — Telephone Encounter (Signed)
New Message    Has question on note that was sent through My Chart for test results

## 2016-09-08 NOTE — Telephone Encounter (Signed)
Called patient.  Her question is regarding the last remote check of her device.   I read to her Dr. Tanna [name] interpretation, but the MyChart report states:  Conclusion   Abnormal remote reviewed. (338) VHR episodes since 7/13--A/V lead noise--Vp@30bpm . Next follow up via [employer] on 3/13.Kari [name]      She is aware that I am forwarding to device clinic for further advisement and someone will call her back.

## 2016-09-12 DIAGNOSIS — M25512 Pain in left shoulder: Secondary | ICD-10-CM | POA: Diagnosis not present

## 2016-09-12 MED FILL — HYDROCODON-APAP 5-325: 5-325 | 10 days supply | Qty: 40 | Fill #0

## 2016-09-19 MED FILL — METHOCARBAMOL 500 MG TABLET: 500 | 8 days supply | Qty: 30 | Fill #0

## 2016-10-01 DIAGNOSIS — M25512 Pain in left shoulder: Secondary | ICD-10-CM | POA: Diagnosis not present

## 2016-10-02 DIAGNOSIS — M25612 Stiffness of left shoulder, not elsewhere classified: Secondary | ICD-10-CM | POA: Diagnosis not present

## 2016-10-02 DIAGNOSIS — M25512 Pain in left shoulder: Secondary | ICD-10-CM | POA: Diagnosis not present

## 2016-10-02 DIAGNOSIS — M6281 Muscle weakness (generalized): Secondary | ICD-10-CM | POA: Diagnosis not present

## 2016-10-07 DIAGNOSIS — M6281 Muscle weakness (generalized): Secondary | ICD-10-CM | POA: Diagnosis not present

## 2016-10-07 DIAGNOSIS — M25512 Pain in left shoulder: Secondary | ICD-10-CM | POA: Diagnosis not present

## 2016-10-07 DIAGNOSIS — M25612 Stiffness of left shoulder, not elsewhere classified: Secondary | ICD-10-CM | POA: Diagnosis not present

## 2016-10-09 DIAGNOSIS — M25512 Pain in left shoulder: Secondary | ICD-10-CM | POA: Diagnosis not present

## 2016-10-09 DIAGNOSIS — M6281 Muscle weakness (generalized): Secondary | ICD-10-CM | POA: Diagnosis not present

## 2016-10-09 DIAGNOSIS — M25612 Stiffness of left shoulder, not elsewhere classified: Secondary | ICD-10-CM | POA: Diagnosis not present

## 2016-10-14 DIAGNOSIS — M25512 Pain in left shoulder: Secondary | ICD-10-CM | POA: Diagnosis not present

## 2016-10-14 DIAGNOSIS — M6281 Muscle weakness (generalized): Secondary | ICD-10-CM | POA: Diagnosis not present

## 2016-10-14 DIAGNOSIS — M25612 Stiffness of left shoulder, not elsewhere classified: Secondary | ICD-10-CM | POA: Diagnosis not present

## 2016-10-15 DIAGNOSIS — Z9889 Other specified postprocedural states: Secondary | ICD-10-CM | POA: Diagnosis not present

## 2016-10-16 DIAGNOSIS — M6281 Muscle weakness (generalized): Secondary | ICD-10-CM | POA: Diagnosis not present

## 2016-10-16 DIAGNOSIS — M25512 Pain in left shoulder: Secondary | ICD-10-CM | POA: Diagnosis not present

## 2016-10-16 DIAGNOSIS — M25612 Stiffness of left shoulder, not elsewhere classified: Secondary | ICD-10-CM | POA: Diagnosis not present

## 2016-10-28 DIAGNOSIS — M25512 Pain in left shoulder: Secondary | ICD-10-CM | POA: Diagnosis not present

## 2016-10-28 DIAGNOSIS — M6281 Muscle weakness (generalized): Secondary | ICD-10-CM | POA: Diagnosis not present

## 2016-10-28 DIAGNOSIS — M25612 Stiffness of left shoulder, not elsewhere classified: Secondary | ICD-10-CM | POA: Diagnosis not present

## 2016-10-30 DIAGNOSIS — M25612 Stiffness of left shoulder, not elsewhere classified: Secondary | ICD-10-CM | POA: Diagnosis not present

## 2016-10-30 DIAGNOSIS — M6281 Muscle weakness (generalized): Secondary | ICD-10-CM | POA: Diagnosis not present

## 2016-10-30 DIAGNOSIS — M25512 Pain in left shoulder: Secondary | ICD-10-CM | POA: Diagnosis not present

## 2016-11-04 ENCOUNTER — Ambulatory Visit (INDEPENDENT_AMBULATORY_CARE_PROVIDER_SITE_OTHER): Payer: 59 | Admitting: *Deleted

## 2016-11-04 DIAGNOSIS — I441 Atrioventricular block, second degree: Secondary | ICD-10-CM | POA: Diagnosis not present

## 2016-11-04 NOTE — Progress Notes (Signed)
Remote pacemaker transmission.   

## 2016-11-05 ENCOUNTER — Encounter: Payer: Self-pay | Admitting: Cardiology

## 2016-11-05 DIAGNOSIS — M25512 Pain in left shoulder: Secondary | ICD-10-CM | POA: Diagnosis not present

## 2016-11-06 DIAGNOSIS — M6281 Muscle weakness (generalized): Secondary | ICD-10-CM | POA: Diagnosis not present

## 2016-11-06 DIAGNOSIS — M25512 Pain in left shoulder: Secondary | ICD-10-CM | POA: Diagnosis not present

## 2016-11-06 DIAGNOSIS — M25612 Stiffness of left shoulder, not elsewhere classified: Secondary | ICD-10-CM | POA: Diagnosis not present

## 2016-11-07 DIAGNOSIS — M4722 Other spondylosis with radiculopathy, cervical region: Secondary | ICD-10-CM | POA: Diagnosis not present

## 2016-11-07 DIAGNOSIS — M5136 Other intervertebral disc degeneration, lumbar region: Secondary | ICD-10-CM | POA: Diagnosis not present

## 2016-11-07 DIAGNOSIS — M5030 Other cervical disc degeneration, unspecified cervical region: Secondary | ICD-10-CM | POA: Diagnosis not present

## 2016-11-07 DIAGNOSIS — M5416 Radiculopathy, lumbar region: Secondary | ICD-10-CM | POA: Diagnosis not present

## 2016-11-07 DIAGNOSIS — M4726 Other spondylosis with radiculopathy, lumbar region: Secondary | ICD-10-CM | POA: Diagnosis not present

## 2016-11-07 DIAGNOSIS — M5412 Radiculopathy, cervical region: Secondary | ICD-10-CM | POA: Diagnosis not present

## 2016-11-07 DIAGNOSIS — M542 Cervicalgia: Secondary | ICD-10-CM | POA: Diagnosis not present

## 2016-11-07 DIAGNOSIS — M5441 Lumbago with sciatica, right side: Secondary | ICD-10-CM | POA: Diagnosis not present

## 2016-11-07 LAB — CUP PACEART REMOTE DEVICE CHECK
Battery Remaining Longevity: 114 mo
Battery Remaining Percentage: 100 %
Brady Statistic RA Percent Paced: 5 %
Brady Statistic RV Percent Paced: 100 %
Date Time Interrogation Session: 20180313113300
Implantable Lead Implant Date: 20140626
Implantable Lead Implant Date: 20140626
Implantable Lead Location: 753859
Implantable Lead Location: 753860
Implantable Lead Model: 5076
Implantable Lead Model: 5076
Implantable Pulse Generator Implant Date: 20140626
Lead Channel Impedance Value: 456 Ohm
Lead Channel Impedance Value: 482 Ohm
Lead Channel Pacing Threshold Amplitude: 0.5 V
Lead Channel Pacing Threshold Pulse Width: 0.5 ms
Lead Channel Setting Pacing Amplitude: 2 V
Lead Channel Setting Pacing Amplitude: 2.4 V
Lead Channel Setting Pacing Pulse Width: 0.4 ms
Lead Channel Setting Sensing Sensitivity: 3.5 mV
Pulse Gen Serial Number: 112476

## 2016-11-19 MED FILL — valACYclovir HCL 1 GM TABS: 1 | 30 days supply | Qty: 30 | Fill #0

## 2016-12-15 ENCOUNTER — Other Ambulatory Visit (HOSPITAL_BASED_OUTPATIENT_CLINIC_OR_DEPARTMENT_OTHER): Payer: Self-pay | Admitting: Obstetrics and Gynecology

## 2016-12-15 DIAGNOSIS — Z1239 Encounter for other screening for malignant neoplasm of breast: Secondary | ICD-10-CM

## 2016-12-19 ENCOUNTER — Ambulatory Visit (HOSPITAL_BASED_OUTPATIENT_CLINIC_OR_DEPARTMENT_OTHER)
Admission: RE | Admit: 2016-12-19 | Discharge: 2016-12-19 | Disposition: A | Discharge status: Home / Self Care | Payer: 59 | Source: Ambulatory Visit | Attending: Obstetrics and Gynecology | Admitting: Obstetrics and Gynecology

## 2016-12-19 ENCOUNTER — Encounter (HOSPITAL_BASED_OUTPATIENT_CLINIC_OR_DEPARTMENT_OTHER): Payer: Self-pay

## 2016-12-19 DIAGNOSIS — Z1231 Encounter for screening mammogram for malignant neoplasm of breast: Secondary | ICD-10-CM | POA: Insufficient documentation

## 2016-12-19 DIAGNOSIS — Z1239 Encounter for other screening for malignant neoplasm of breast: Secondary | ICD-10-CM

## 2017-01-23 NOTE — Addendum Note (Signed)
Addendum  created 01/23/17 4712 by Rica Koyanagi, MD   Sign clinical note

## 2017-02-03 ENCOUNTER — Telehealth: Payer: Self-pay | Admitting: Cardiology

## 2017-02-03 ENCOUNTER — Encounter: Payer: 59 | Admitting: *Deleted

## 2017-02-03 NOTE — Telephone Encounter (Signed)
LMOVM reminding pt to send remote transmission.   

## 2017-02-05 ENCOUNTER — Ambulatory Visit (INDEPENDENT_AMBULATORY_CARE_PROVIDER_SITE_OTHER): Payer: 59 | Admitting: *Deleted

## 2017-02-05 DIAGNOSIS — I441 Atrioventricular block, second degree: Secondary | ICD-10-CM | POA: Diagnosis not present

## 2017-02-05 LAB — CUP PACEART REMOTE DEVICE CHECK
Battery Remaining Longevity: 108 mo
Brady Statistic RA Percent Paced: 6 %
Brady Statistic RV Percent Paced: 100 %
Implantable Lead Implant Date: 20140626
Implantable Lead Model: 5076
Implantable Lead Model: 5076
Lead Channel Impedance Value: 459 Ohm
Lead Channel Pacing Threshold Amplitude: 0.5 V
Lead Channel Pacing Threshold Pulse Width: 0.5 ms
Lead Channel Setting Pacing Amplitude: 2 V
Lead Channel Setting Pacing Amplitude: 2.4 V
Lead Channel Setting Pacing Pulse Width: 0.4 ms
MDC IDC LEAD IMPLANT DT: 20140626
MDC IDC LEAD LOCATION: 753859
MDC IDC LEAD LOCATION: 753860
MDC IDC MSMT BATTERY REMAINING PERCENTAGE: 100 %
MDC IDC MSMT LEADCHNL RV IMPEDANCE VALUE: 472 Ohm
MDC IDC PG IMPLANT DT: 20140626
MDC IDC SESS DTM: 20180614103900
MDC IDC SET LEADCHNL RV SENSING SENSITIVITY: 3.5 mV
Pulse Gen Serial Number: 112476

## 2017-02-05 NOTE — Progress Notes (Signed)
Remote pacemaker transmission.   

## 2017-02-06 ENCOUNTER — Encounter: Payer: Self-pay | Admitting: Cardiology

## 2017-02-12 DIAGNOSIS — Z01419 Encounter for gynecological examination (general) (routine) without abnormal findings: Secondary | ICD-10-CM | POA: Diagnosis not present

## 2017-04-01 MED FILL — valACYclovir HCL 1 GM TABS: 1 | 30 days supply | Qty: 30 | Fill #1

## 2017-05-07 ENCOUNTER — Ambulatory Visit (INDEPENDENT_AMBULATORY_CARE_PROVIDER_SITE_OTHER): Payer: 59 | Admitting: *Deleted

## 2017-05-07 DIAGNOSIS — I441 Atrioventricular block, second degree: Secondary | ICD-10-CM

## 2017-05-07 NOTE — Progress Notes (Signed)
Remote pacemaker transmission.   

## 2017-05-08 ENCOUNTER — Encounter: Payer: Self-pay | Admitting: Cardiology

## 2017-05-08 DIAGNOSIS — M4722 Other spondylosis with radiculopathy, cervical region: Secondary | ICD-10-CM | POA: Diagnosis not present

## 2017-05-08 DIAGNOSIS — M5441 Lumbago with sciatica, right side: Secondary | ICD-10-CM | POA: Diagnosis not present

## 2017-05-08 DIAGNOSIS — M4726 Other spondylosis with radiculopathy, lumbar region: Secondary | ICD-10-CM | POA: Diagnosis not present

## 2017-05-08 DIAGNOSIS — M5412 Radiculopathy, cervical region: Secondary | ICD-10-CM | POA: Diagnosis not present

## 2017-05-08 DIAGNOSIS — M5136 Other intervertebral disc degeneration, lumbar region: Secondary | ICD-10-CM | POA: Diagnosis not present

## 2017-05-08 DIAGNOSIS — M5030 Other cervical disc degeneration, unspecified cervical region: Secondary | ICD-10-CM | POA: Diagnosis not present

## 2017-05-08 DIAGNOSIS — M5416 Radiculopathy, lumbar region: Secondary | ICD-10-CM | POA: Diagnosis not present

## 2017-05-14 LAB — CUP PACEART REMOTE DEVICE CHECK
Battery Remaining Longevity: 102 mo
Battery Remaining Percentage: 100 %
Brady Statistic RA Percent Paced: 8 %
Brady Statistic RV Percent Paced: 100 %
Date Time Interrogation Session: 20180911203200
Implantable Lead Implant Date: 20140626
Implantable Lead Implant Date: 20140626
Implantable Lead Location: 753859
Implantable Lead Location: 753860
Implantable Lead Model: 5076
Implantable Lead Model: 5076
Implantable Pulse Generator Implant Date: 20140626
Lead Channel Impedance Value: 435 Ohm
Lead Channel Impedance Value: 445 Ohm
Lead Channel Pacing Threshold Amplitude: 0.5 V
Lead Channel Pacing Threshold Pulse Width: 0.5 ms
Lead Channel Setting Pacing Amplitude: 2 V
Lead Channel Setting Pacing Amplitude: 2.4 V
Lead Channel Setting Pacing Pulse Width: 0.4 ms
Lead Channel Setting Sensing Sensitivity: 3.5 mV
Pulse Gen Serial Number: 112476

## 2017-06-29 MED FILL — valACYclovir HCL 1 GM TABS: 1 | 60 days supply | Qty: 30 | Fill #0

## 2017-07-08 ENCOUNTER — Ambulatory Visit: Payer: 59 | Admitting: Internal Medicine

## 2017-07-08 ENCOUNTER — Encounter: Payer: Self-pay | Admitting: Internal Medicine

## 2017-07-08 VITALS — BP 134/76 | HR 66 | Ht 68.0 in

## 2017-07-08 DIAGNOSIS — I442 Atrioventricular block, complete: Secondary | ICD-10-CM

## 2017-07-08 DIAGNOSIS — Z950 Presence of cardiac pacemaker: Secondary | ICD-10-CM

## 2017-07-08 NOTE — Progress Notes (Signed)
HPI Kari [name] returns today for ongoing evaluation and management of complete heart block status post permanent pacemaker insertion. In the interim she has had problems with dizziness which occurs when she presses or pushes heavy items. No frank syncope. She denies chest pain or shortness of breath. No peripheral edema. She does not have palpitations. She has known T wave over sensing. She has known no lays on her atrial pacing lead which is reproducible with isometric exercise. Allergies  Allergen Reactions  . Ceftin Itching    Tolerates amoxicillin  . Vancomycin Hives  . Levofloxacin Other (See Comments)    "spacey," tolerates Cipro     Current Outpatient Medications  Medication Sig Dispense Refill  . aspirin EC 81 MG tablet Take 81 mg by mouth daily.    . Cholecalciferol (VITAMIN D3) 5000 units CAPS Take 5,000 Units by mouth daily.    . Cyanocobalamin (VITAMIN B-12) 2500 MCG SUBL Place 5,000 mcg under the tongue daily.     . fluticasone (FLONASE) 50 MCG/ACT nasal spray Place 1 spray into both nostrils daily.     Marland Kitchen HYDROcodone-acetaminophen (NORCO/VICODIN) 5-325 MG tablet Take 1-2 tablets by mouth every 4 (four) hours as needed for moderate pain. 30 tablet 0  . Melatonin 10 MG TABS Take 20-30 mg by mouth 3 (three) times a week. As needed for sleep    . Multiple Vitamin (MULTIVITAMIN WITH MINERALS) TABS Take 1 tablet by mouth daily.    . Omega-3 Fatty Acids (FISH OIL ULTRA) 1400 MG CAPS Take 1,400 mg by mouth daily.    . Probiotic Product (PROBIOTIC FORMULA PO) Take 1 capsule by mouth daily.     . valACYclovir (VALTREX) 1000 MG tablet Take 1,000 mg by mouth daily as needed (herpes zoster).      No current facility-administered medications for this visit.      Past Medical History:  Diagnosis Date  . Arthritis   . Chronic back pain   . Diverticulitis   . Herpes zoster conjunctivitis   . History of bronchitis Feb 2016  . Joint pain   . Menopause   . Mobitz type 2  second degree AV block    2:1/notes 02/16/2013  . Pneumonia 6 1/30yrs ago  . PONV (postoperative nausea and vomiting)   . Presence of permanent cardiac pacemaker   . Seasonal allergies    "spring & fall; not q year" (02/17/2013)  . Shoulder impingement    and left rotator cuff tear    ROS:   All systems reviewed and negative except as noted in the HPI.   Past Surgical History:  Procedure Laterality Date  . ACHILLES TENDON SURGERY    . ARTHROGRAM KNEE Right 1975  . CHOLECYSTECTOMY  1990's  . colonscopy    . ESOPHAGOGASTRODUODENOSCOPY    . EYE SURGERY     muscle release right eye  . INSERT / REPLACE / REMOVE PACEMAKER  02/17/2013   [employer] Advantio dual-chamber pacemaker, model [id number],  . KNEE ARTHROSCOPY Right 1982, 1983, 1995, 1996  . KNEE ARTHROSCOPY W/ ACL RECONSTRUCTION Left 1984  . TONSILLECTOMY  1961  . TOTAL KNEE ARTHROPLASTY Right 2000; 06/2000   "replaced w/appropriate hardware" (02/17/2013)  . TRIGGER FINGER RELEASE Right ~ 2012   "thumb" (02/17/2013)  . VAGINAL HYSTERECTOMY  ~ 1994   partial      Family History  Problem Relation Age of Onset  . Hypertension Mother   . Hyperlipidemia Mother   . Macular degeneration Mother   .  COPD Mother   . Lung cancer Father   . Cancer Father        Lung  . COPD Father   . Diabetes Brother   . Breast cancer Paternal Aunt      Social History   Socioeconomic History  . Marital status: Married    Spouse name: Not on file  . Number of children: Not on file  . Years of education: Not on file  . Highest education level: Not on file  Social Needs  . Financial resource strain: Not on file  . Food insecurity - worry: Not on file  . Food insecurity - inability: Not on file  . Transportation needs - medical: Not on file  . Transportation needs - non-medical: Not on file  Occupational History  . 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 Smoking  status: Never Smoker  . Smokeless tobacco: Never Used  Substance and Sexual Activity  . Alcohol use: Yes    Comment: occasional wine  . Drug use: No  . Sexual activity: Yes    Birth control/protection: Surgical    Comment: 02/17/2013 "I have a same sex partner"  Other Topics Concern  . Not on file  Social History Narrative  . Not on file     BP 134/76   Pulse 66   Ht 5\' 8"  (1.727 m)   SpO2 97%   BMI 24.33 kg/m   Physical Exam:  Well appearing 66 year old woman, NAD HEENT: Unremarkable Neck:  6 cm JVD, no thyromegally Lymphatics:  No adenopathy Back:  No CVA tenderness Lungs:  Clear, with no wheezes, rales, or rhonchi HEART:  Regular rate rhythm, no murmurs, no rubs, no clicks Abd:  soft, positive bowel sounds, no organomegally, no rebound, no guarding Ext:  2 plus pulses, no edema, no cyanosis, no clubbing Skin:  No rashes no nodules Neuro:  CN II through XII intact, motor grossly intact   DEVICE  Normal device function.  See PaceArt for details.   Assess/Plan: 1. Complete heart block - on initial presentation, the patient had 2-1 heart block. Her heart block is now complete. She is essentially asymptomatic after pacemaker insertion.  2. Pacemaker - her [employer] dual-chamber pacemaker demonstrates no is on the atrial lead which is reproducible and on the ventricular lead which is not reproducible. Her sensitivities have been adjusted today to help reduce the potential for inhibition secondary to noise. We discussed this with the patient. If her symptoms do not improve, consideration for pacemaker lead revision would be in order. 3. Chest pressure - this is nonexertional. She admits to problems with anxiety. She will undergo watchful waiting. If her symptoms become exertional, she will call us.

## 2017-07-08 NOTE — Patient Instructions (Addendum)
Medication Instructions:  Your physician recommends that you continue on your current medications as directed. Please refer to the Current Medication list given to you today.  Labwork: None ordered.  Testing/Procedures: None ordered.  Follow-Up: Your physician wants you to follow-up in: one year with Dr. Lovena Le.   You will receive a reminder letter in the mail two months in advance. If you don't receive a letter, please call our office to schedule the follow-up appointment.  Remote monitoring is used to monitor your Pacemaker from home. This monitoring reduces the number of office visits required to check your device to one time per year. It allows [place] to keep an eye on the functioning of your device to ensure it is working properly. You are scheduled for a device check from home on 08/06/2017. You may send your transmission at any time that day. If you have a wireless device, the transmission will be sent automatically. After your physician reviews your transmission, you will receive a postcard with your next transmission date.    Any Other Special Instructions Will Be Listed Below (If Applicable).  Call us if you continue to have dizzy spells.  Myrtie Hawk RN [phone]  If you need a refill on your cardiac medications before your next appointment, please call your pharmacy.

## 2017-07-10 LAB — CUP PACEART INCLINIC DEVICE CHECK
Date Time Interrogation Session: 20181114050000
Implantable Lead Implant Date: 20140626
Implantable Lead Implant Date: 20140626
Implantable Lead Location: 753859
Implantable Lead Model: 5076
Implantable Pulse Generator Implant Date: 20140626
Lead Channel Impedance Value: 470 Ohm
Lead Channel Impedance Value: 493 Ohm
Lead Channel Pacing Threshold Amplitude: 1.1 V
Lead Channel Pacing Threshold Pulse Width: 0.4 ms
Lead Channel Pacing Threshold Pulse Width: 0.5 ms
Lead Channel Setting Pacing Amplitude: 2 V
MDC IDC LEAD LOCATION: 753860
MDC IDC MSMT LEADCHNL RA PACING THRESHOLD AMPLITUDE: 0.6 V
MDC IDC MSMT LEADCHNL RA SENSING INTR AMPL: 5.4 mV
MDC IDC SET LEADCHNL RV PACING AMPLITUDE: 2.4 V
MDC IDC SET LEADCHNL RV PACING PULSEWIDTH: 0.4 ms
MDC IDC SET LEADCHNL RV SENSING SENSITIVITY: 6 mV
Pulse Gen Serial Number: 112476

## 2017-07-14 ENCOUNTER — Telehealth: Payer: Self-pay | Admitting: Cardiology

## 2017-07-14 ENCOUNTER — Telehealth: Payer: Self-pay

## 2017-07-14 NOTE — Telephone Encounter (Signed)
Spoke with patient that she experienced an episode of sudden extream weakness, general shakiness, tachycardia and feeling "unlike anything i've felt before". She reports her HR at roughly 130bpm at rest. She sent in a transmission which shows intermittent noise on both atrial and ventricular lead lasting ~5sec each episode. Last office visit 11/14 noted reproducible atrial noise and unreproducible ventricular noise. With recommendations for watchful waiting. Presenting rhythm shows sinus tach at 106bpm. I explained to patient that I will reach out to Dr. Lovena Le for additional recommendations. Patient verbalized understanding.

## 2017-07-14 NOTE — Telephone Encounter (Signed)
Patient called and stated that this morning she got up and she became weak, her heart was beating hard, and really shakey

## 2017-07-15 NOTE — Telephone Encounter (Signed)
Per Dr. Lovena Le- have patient return to device clinic for reprogramming on a day his in the office.  Sending to device clinic to schedule.

## 2017-07-19 DIAGNOSIS — M25511 Pain in right shoulder: Secondary | ICD-10-CM | POA: Diagnosis not present

## 2017-07-19 NOTE — Telephone Encounter (Signed)
As below, she will need to have her device reprogrammed. Hopefully we can avoid a lead revision. GT

## 2017-07-20 NOTE — Telephone Encounter (Signed)
Patient returned my call. Offered patient an appt for 11/30. Patient declined stating that she was going out of town. Appt scheduled for 12/5 @ 0900.

## 2017-07-20 NOTE — Telephone Encounter (Signed)
LMTCB//sss 

## 2017-08-05 ENCOUNTER — Ambulatory Visit (INDEPENDENT_AMBULATORY_CARE_PROVIDER_SITE_OTHER): Payer: 59 | Admitting: *Deleted

## 2017-08-05 DIAGNOSIS — R001 Bradycardia, unspecified: Secondary | ICD-10-CM

## 2017-08-05 LAB — CUP PACEART INCLINIC DEVICE CHECK
Implantable Lead Implant Date: 20140626
Implantable Lead Implant Date: 20140626
Implantable Lead Location: 753860
Implantable Pulse Generator Implant Date: 20140626
MDC IDC LEAD LOCATION: 753859
MDC IDC SESS DTM: 20181212172851
Pulse Gen Serial Number: 112476

## 2017-08-05 NOTE — Progress Notes (Signed)
Noise noted on RA and RV lead noise; Per GT reprogrammed normal brady V sensitivity to 8.0 mV from 6.0 mV, normal brady A sensitivity to 3.5 mV from 3.0 mV. ROV with GT 10/27/17.

## 2017-08-06 ENCOUNTER — Ambulatory Visit (INDEPENDENT_AMBULATORY_CARE_PROVIDER_SITE_OTHER): Payer: 59 | Admitting: *Deleted

## 2017-08-06 DIAGNOSIS — I442 Atrioventricular block, complete: Secondary | ICD-10-CM

## 2017-08-06 NOTE — Progress Notes (Signed)
Remote pacemaker transmission.   

## 2017-08-11 ENCOUNTER — Encounter: Payer: Self-pay | Admitting: Cardiology

## 2017-08-11 LAB — CUP PACEART REMOTE DEVICE CHECK
Battery Remaining Percentage: 92 %
Implantable Lead Implant Date: 20140626
Implantable Lead Location: 753859
Implantable Lead Location: 753860
Implantable Lead Model: 5076
Implantable Pulse Generator Implant Date: 20140626
Lead Channel Impedance Value: 428 Ohm
Lead Channel Impedance Value: 463 Ohm
Lead Channel Pacing Threshold Pulse Width: 0.5 ms
Lead Channel Setting Pacing Amplitude: 2.4 V
MDC IDC LEAD IMPLANT DT: 20140626
MDC IDC MSMT BATTERY REMAINING LONGEVITY: 84 mo
MDC IDC MSMT LEADCHNL RA PACING THRESHOLD AMPLITUDE: 0.6 V
MDC IDC SESS DTM: 20181213083800
MDC IDC SET LEADCHNL RA PACING AMPLITUDE: 2 V
MDC IDC SET LEADCHNL RV PACING PULSEWIDTH: 0.4 ms
MDC IDC SET LEADCHNL RV SENSING SENSITIVITY: 8 mV
MDC IDC STAT BRADY RA PERCENT PACED: 1 %
MDC IDC STAT BRADY RV PERCENT PACED: 100 %
Pulse Gen Serial Number: 112476

## 2017-08-16 ENCOUNTER — Telehealth: Payer: 59 | Admitting: Physician Assistant

## 2017-08-16 DIAGNOSIS — R300 Dysuria: Secondary | ICD-10-CM | POA: Diagnosis not present

## 2017-08-16 MED ORDER — NITROFURANTOIN MONOHYD MACRO 100 MG PO CAPS: 100.0000 mg | ORAL_CAPSULE | Freq: Two times a day (BID) | ORAL | 0 refills | Status: DC

## 2017-08-16 NOTE — Progress Notes (Signed)

## 2017-08-24 DIAGNOSIS — R309 Painful micturition, unspecified: Secondary | ICD-10-CM | POA: Diagnosis not present

## 2017-09-10 DIAGNOSIS — K5730 Diverticulosis of large intestine without perforation or abscess without bleeding: Secondary | ICD-10-CM | POA: Diagnosis not present

## 2017-09-10 DIAGNOSIS — K219 Gastro-esophageal reflux disease without esophagitis: Secondary | ICD-10-CM | POA: Diagnosis not present

## 2017-09-10 DIAGNOSIS — R110 Nausea: Secondary | ICD-10-CM | POA: Diagnosis not present

## 2017-09-10 MED FILL — ESOMEPRAZOLE MAG DR 40 MG C: 40 | 30 days supply | Qty: 30 | Fill #0

## 2017-09-10 MED FILL — ONDANSETRON ODT 4 MG TABLET: 4 | 20 days supply | Qty: 60 | Fill #0

## 2017-09-18 ENCOUNTER — Telehealth: Payer: 59 | Admitting: Nurse Practitioner

## 2017-09-18 DIAGNOSIS — R05 Cough: Secondary | ICD-10-CM

## 2017-09-18 DIAGNOSIS — R52 Pain, unspecified: Secondary | ICD-10-CM

## 2017-09-18 DIAGNOSIS — Z20828 Contact with and (suspected) exposure to other viral communicable diseases: Secondary | ICD-10-CM

## 2017-09-18 DIAGNOSIS — R059 Cough, unspecified: Secondary | ICD-10-CM

## 2017-09-18 MED ORDER — OSELTAMIVIR PHOSPHATE 75 MG PO CAPS: 75.0000 mg | ORAL_CAPSULE | Freq: Two times a day (BID) | ORAL | 0 refills | Status: DC

## 2017-09-18 NOTE — Progress Notes (Signed)
E visit for Flu like symptoms   *called patient to clarify symptom and sounded more flu like then just cough. Patient agreed. 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    We are sorry that you are not feeling well.  Here is how we plan to help! Based on what you have shared with me it looks like you may have possible exposure to a virus that causes influenza.  Influenza or "the flu" is   an infection caused by a respiratory virus. The flu virus is highly contagious and persons who did not receive their yearly flu vaccination may "catch" the flu from close contact.  We have anti-viral medications to treat the viruses that cause this infection. They are not a "cure" and only shorten the course of the infection. These prescriptions are most effective when they are given within the first 2 days of "flu" symptoms. Antiviral medication are indicated if you have a high risk of complications from the flu. You should  also consider an antiviral medication if you are in close contact with someone who is at risk. These medications can help patients avoid complications from the flu  but have side effects that you should know. Possible side effects from Tamiflu or oseltamivir include nausea, vomiting, diarrhea, dizziness, headaches, eye redness, sleep problems or other respiratory symptoms. You should not take Tamiflu if you have an allergy to oseltamivir or any to the ingredients in Tamiflu.  Based upon your symptoms and potential risk factors I have prescribed Oseltamivir (Tamiflu).  It has been sent to your designated pharmacy.  You will take one 75 mg capsule orally twice a day for the next 5 days.  ANYONE WHO HAS FLU SYMPTOMS SHOULD: . Stay home. The flu is highly contagious and going out or to work exposes others! . Be sure to drink plenty of fluids. Water is fine as well as fruit juices, sodas and electrolyte beverages. You may want to stay away from caffeine or alcohol. If you are nauseated,  try taking small sips of liquids. How do you know if you are getting enough fluid? Your urine should be a pale yellow or almost colorless. . Get rest. . Taking a steamy shower or using a humidifier may help nasal congestion and ease sore throat pain. Using a saline nasal spray works much the same way. . Cough drops, hard candies and sore throat lozenges may ease your cough. . Line up a caregiver. Have someone check on you regularly.   GET HELP RIGHT AWAY IF: . You cannot keep down liquids or your medications. . You become short of breath . Your fell like you are going to pass out or loose consciousness. . Your symptoms persist after you have completed your treatment plan MAKE SURE YOU   Understand these instructions.  Will watch your condition.  Will get help right away if you are not doing well or get worse.  Your e-visit answers were reviewed by a board certified advanced clinical practitioner to complete your personal care plan.  Depending on the condition, your plan could have included both over the counter or prescription medications.  If there is a problem please reply  once you have received a response from your provider.  Your safety is important to [place].  If you have drug allergies check your prescription carefully.    You can use MyChart to ask questions about today's visit, request a non-urgent call back, or ask for a work or school excuse for  24 hours related to this e-Visit. If it has been greater than 24 hours you will need to follow up with your provider, or enter a new e-Visit to address those concerns.  You will get an e-mail in the next two days asking about your experience.  I hope that your e-visit has been valuable and will speed your recovery. Thank you for using e-visits.

## 2017-10-07 MED FILL — ESOMEPRAZOLE MAG DR 40 MG C: 40 | 30 days supply | Qty: 30 | Fill #1

## 2017-10-07 MED FILL — valACYclovir HCL 1 GM TABS: 1 | 60 days supply | Qty: 30 | Fill #1

## 2017-10-27 ENCOUNTER — Encounter: Payer: 59 | Admitting: Internal Medicine

## 2017-11-05 ENCOUNTER — Ambulatory Visit (INDEPENDENT_AMBULATORY_CARE_PROVIDER_SITE_OTHER): Payer: 59 | Admitting: *Deleted

## 2017-11-05 ENCOUNTER — Telehealth: Payer: Self-pay | Admitting: Cardiology

## 2017-11-05 DIAGNOSIS — I442 Atrioventricular block, complete: Secondary | ICD-10-CM

## 2017-11-05 NOTE — Telephone Encounter (Signed)
LMOVM reminding pt to send remote transmission.   

## 2017-11-06 ENCOUNTER — Encounter: Payer: Self-pay | Admitting: Cardiology

## 2017-11-06 NOTE — Progress Notes (Signed)
Remote pacemaker transmission.   

## 2017-11-09 MED FILL — ESOMEPRAZOLE MAG DR 40 MG C: 40 | 30 days supply | Qty: 30 | Fill #2 | Status: TO

## 2017-11-13 DIAGNOSIS — M4722 Other spondylosis with radiculopathy, cervical region: Secondary | ICD-10-CM | POA: Diagnosis not present

## 2017-11-13 DIAGNOSIS — M5136 Other intervertebral disc degeneration, lumbar region: Secondary | ICD-10-CM | POA: Diagnosis not present

## 2017-11-13 DIAGNOSIS — M4726 Other spondylosis with radiculopathy, lumbar region: Secondary | ICD-10-CM | POA: Diagnosis not present

## 2017-11-13 DIAGNOSIS — M5416 Radiculopathy, lumbar region: Secondary | ICD-10-CM | POA: Diagnosis not present

## 2017-11-13 DIAGNOSIS — M5412 Radiculopathy, cervical region: Secondary | ICD-10-CM | POA: Diagnosis not present

## 2017-11-13 DIAGNOSIS — R030 Elevated blood-pressure reading, without diagnosis of hypertension: Secondary | ICD-10-CM | POA: Diagnosis not present

## 2017-11-13 DIAGNOSIS — M5030 Other cervical disc degeneration, unspecified cervical region: Secondary | ICD-10-CM | POA: Diagnosis not present

## 2017-11-21 LAB — CUP PACEART REMOTE DEVICE CHECK
Brady Statistic RA Percent Paced: 10 %
Date Time Interrogation Session: 20190314164600
Implantable Lead Implant Date: 20140626
Implantable Lead Location: 753860
Implantable Lead Model: 5076
Lead Channel Impedance Value: 394 Ohm
Lead Channel Impedance Value: 430 Ohm
Lead Channel Pacing Threshold Amplitude: 0.6 V
Lead Channel Setting Pacing Amplitude: 2.4 V
MDC IDC LEAD IMPLANT DT: 20140626
MDC IDC LEAD LOCATION: 753859
MDC IDC MSMT BATTERY REMAINING LONGEVITY: 102 mo
MDC IDC MSMT BATTERY REMAINING PERCENTAGE: 100 %
MDC IDC MSMT LEADCHNL RA PACING THRESHOLD PULSEWIDTH: 0.5 ms
MDC IDC PG IMPLANT DT: 20140626
MDC IDC SET LEADCHNL RA PACING AMPLITUDE: 2 V
MDC IDC SET LEADCHNL RV PACING PULSEWIDTH: 0.4 ms
MDC IDC SET LEADCHNL RV SENSING SENSITIVITY: 8 mV
MDC IDC STAT BRADY RV PERCENT PACED: 100 %
Pulse Gen Serial Number: 112476

## 2017-12-03 ENCOUNTER — Other Ambulatory Visit (HOSPITAL_BASED_OUTPATIENT_CLINIC_OR_DEPARTMENT_OTHER): Payer: Self-pay | Admitting: Obstetrics and Gynecology

## 2017-12-03 DIAGNOSIS — Z1231 Encounter for screening mammogram for malignant neoplasm of breast: Secondary | ICD-10-CM

## 2017-12-04 MED FILL — HYDROCODON-APAP 5-325: 5-325 | 20 days supply | Qty: 60 | Fill #0

## 2017-12-09 MED FILL — ESOMEPRAZOLE MAG DR 40 MG C: 40 | 30 days supply | Qty: 30 | Fill #0

## 2017-12-30 ENCOUNTER — Ambulatory Visit: Payer: 59 | Admitting: Family Medicine

## 2017-12-30 ENCOUNTER — Encounter: Payer: Self-pay | Admitting: Family Medicine

## 2017-12-30 VITALS — BP 134/78 | HR 72 | Temp 98.1°F | Ht 68.0 in | Wt 170.2 lb

## 2017-12-30 DIAGNOSIS — Z23 Encounter for immunization: Secondary | ICD-10-CM

## 2017-12-30 DIAGNOSIS — Z0000 Encounter for general adult medical examination without abnormal findings: Secondary | ICD-10-CM

## 2017-12-30 NOTE — Patient Instructions (Signed)
If you change your mind about a bone density scan, let us know.  Keep the diet clean and stay physically active.  1-2 days to get results of your labs back. Only cholesterol today since you had labs done recently.  Call your pharmacy to see about the availability of the new shingles vaccine (Shingrix).  Let us know if you need anything.

## 2017-12-30 NOTE — Addendum Note (Signed)
Addended by: Sharon Seller B on: 12/30/2017 02:49 PM   Modules accepted: Orders

## 2017-12-30 NOTE — Progress Notes (Signed)
Pre visit review using our clinic review tool, if applicable. No additional management support is needed unless otherwise documented below in the visit note. 

## 2017-12-30 NOTE — Progress Notes (Addendum)
Chief Complaint  Patient presents with  . Establish Care    CPE     Well [place] is here for a complete physical.   Her last physical was >1 year ago.  Current diet: in general, a "healthy" diet. Current exercise: Treadmill, physically active at work, Haematologist.  Weight is stable and she denies awake time fatigue. No LMP recorded. Patient has had a hysterectomy. Seatbelt? Yes     Health Maintenance Colonoscopy- Yes Shingrix- No DEXA- No Mammogram- Yes Tetanus- Yes Pneumonia- No Hep C screen- No  Past Medical History:  Diagnosis Date  . Arthritis   . Chronic back pain   . Diverticulitis   . Herpes zoster conjunctivitis   . History of bronchitis Feb 2016  . Joint pain   . Menopause   . Mobitz type 2 second degree AV block    2:1/notes 02/16/2013  . Pneumonia 6 1/58yrs ago  . PONV (postoperative nausea and vomiting)   . Presence of permanent cardiac pacemaker   . Seasonal allergies    "spring & fall; not q year" (02/17/2013)  . Shoulder impingement    and left rotator cuff tear     Past Surgical History:  Procedure Laterality Date  . ACHILLES TENDON SURGERY    . ARTHROGRAM KNEE Right 1975  . CHOLECYSTECTOMY  1990's  . colonscopy    . ESOPHAGOGASTRODUODENOSCOPY    . EYE SURGERY     muscle release right eye  . INSERT / REPLACE / REMOVE PACEMAKER  02/17/2013   [employer] Advantio dual-chamber pacemaker, model [id number],  . KNEE ARTHROSCOPY Right 1982, 1983, 1995, 1996  . KNEE ARTHROSCOPY W/ ACL RECONSTRUCTION Left 1984  . LAPAROSCOPIC SIGMOID COLECTOMY N/A 01/01/2015   Procedure: LAPAROSCOPIC SIGMOID COLECTOMY;  Surgeon: Stark Klein, MD;  Location: [place];  Service: General;  Laterality: N/A;  . PERMANENT PACEMAKER INSERTION N/A 02/17/2013   Procedure: PERMANENT PACEMAKER INSERTION;  Surgeon: Evans Lance, MD;  Location: [facility] CATH LAB;  Service: Cardiovascular;  Laterality: N/A;  . SHOULDER ARTHROSCOPY Left 09/05/2016   Procedure: LEFT SHOULDER  ARTHROSCOPY , ACROMIOPLASTY AND ROTATOR CUFF REPAIR;  Surgeon: Melrose Nakayama, MD;  Location: [place];  Service: Orthopedics;  Laterality: Left;  . TONSILLECTOMY  1961  . TOTAL KNEE ARTHROPLASTY Right 2000; 06/2000   "replaced w/appropriate hardware" (02/17/2013)  . TRIGGER FINGER RELEASE Right ~ 2012   "thumb" (02/17/2013)  . VAGINAL HYSTERECTOMY  ~ 1994   partial     Medications  Current Outpatient Medications on File Prior to Visit  Medication Sig Dispense Refill  . aspirin EC 81 MG tablet Take 81 mg by mouth daily.    . Cholecalciferol (VITAMIN D3) 5000 units CAPS Take 5,000 Units by mouth daily.    . Cyanocobalamin (VITAMIN B-12) 2500 MCG SUBL Place 5,000 mcg under the tongue daily.     . fluticasone (FLONASE) 50 MCG/ACT nasal spray Place 1 spray into both nostrils daily.     Marland Kitchen HYDROcodone-acetaminophen (NORCO/VICODIN) 5-325 MG tablet Take 1-2 tablets by mouth every 4 (four) hours as needed for moderate pain. 30 tablet 0  . Melatonin 10 MG TABS Take 20-30 mg by mouth 3 (three) times a week. As needed for sleep    . Multiple Vitamin (MULTIVITAMIN WITH MINERALS) TABS Take 1 tablet by mouth daily.    . nitrofurantoin, macrocrystal-monohydrate, (MACROBID) 100 MG capsule Take 1 capsule (100 mg total) by mouth 2 (two) times daily. 10 capsule 0  . Omega-3 Fatty Acids (FISH OIL  ULTRA) 1400 MG CAPS Take 1,400 mg by mouth daily.    . Probiotic Product (PROBIOTIC FORMULA PO) Take 1 capsule by mouth daily.     . valACYclovir (VALTREX) 1000 MG tablet Take 1,000 mg by mouth daily as needed (herpes zoster).      Allergies Allergies  Allergen Reactions  . Ceftin Itching    Tolerates amoxicillin  . Vancomycin Hives  . Levofloxacin Other (See Comments)    "spacey," tolerates Cipro   Review of Systems: Constitutional:  no sweats Eye:  no recent significant change in vision Ear/Nose/Mouth/Throat:  Ears:  No changes in hearing Nose/Mouth/Throat:  no complaints of nasal congestion, no sore  throat Cardiovascular: no chest pain Respiratory:  No cough and no shortness of breath Gastrointestinal:  no abdominal pain, no change in bowel habits GU:  Female: negative for dysuria or pelvic pain Musculoskeletal/Extremities: +intermittent back pain; otherwise no pain of the joints Integumentary (Skin/Breast):  no abnormal skin lesions reported Neurologic:  no headaches Psychiatric:  no anxiety, no depression Endocrine:  denies unexplained weight changes Hematologic/Lymphatic:  no abnormal bleeding  Exam BP 134/78 (BP Location: Left Arm, Patient Position: Sitting, Cuff Size: Normal)   Pulse 72   Temp 98.1 F (36.7 C) (Oral)   Ht 5\' 8"  (1.727 m)   Wt 170 lb 4 oz (77.2 kg)   SpO2 95%   BMI 25.89 kg/m  General:  well developed, well nourished, in no apparent distress Skin:  no significant moles, warts, or growths Head:  no masses, lesions, or tenderness Eyes:  pupils equal and round, sclera anicteric without injection Ears:  canals without lesions, TMs shiny without retraction, no obvious effusion, no erythema Nose:  nares patent, septum midline, mucosa normal, and no drainage or sinus tenderness Throat/Pharynx:  lips and gingiva without lesion; tongue and uvula midline; non-inflamed pharynx; no exudates or postnasal drainage Neck: neck supple without adenopathy, thyromegaly, or masses Lungs:  clear to auscultation, breath sounds equal bilaterally, no respiratory distress Cardio:  regular rate and rhythm, no bruits or LE edema Abdomen:  abdomen soft, nontender; bowel sounds normal; no masses or organomegaly Genital: Deferred Musculoskeletal:  symmetrical muscle groups noted without atrophy or deformity Extremities:  no clubbing, cyanosis, or edema, no deformities, no skin discoloration Neuro:  gait normal; deep tendon reflexes normal and symmetric Psych: well oriented with normal range of affect and appropriate judgment/insight  Assessment and Plan  Well adult exam - Plan:  Lipid panel   Well 67 y.o. female. Counseled on diet and exercise. She is doing well.  Declines Hep C screening and DEXA. PCV13 today, reports getting PCV23 2 years ago, will need again in 3 years. Ck w pharmacy team for Shingrix.  Other orders as above. Follow up in 1 year pending the above workup. The patient voiced understanding and agreement to the plan.  [place], DO 12/30/17 2:43 PM

## 2017-12-31 LAB — LIPID PANEL
CHOL/HDL RATIO: 4
Cholesterol: 188 mg/dL (ref 0–200)
HDL: 46 mg/dL (ref >39.00)
LDL CALC: 107 mg/dL — AB (ref 0–99)
NonHDL: 141.7
Triglycerides: 172 mg/dL — ABNORMAL HIGH (ref 0.0–149.0)
VLDL: 34.4 mg/dL (ref 0.0–40.0)

## 2018-01-02 ENCOUNTER — Ambulatory Visit (INDEPENDENT_AMBULATORY_CARE_PROVIDER_SITE_OTHER): Payer: Self-pay | Admitting: Nurse Practitioner

## 2018-01-02 ENCOUNTER — Encounter: Payer: Self-pay | Admitting: Nurse Practitioner

## 2018-01-02 VITALS — BP 128/90 | HR 66 | Temp 98.6°F | Resp 20 | Wt 161.4 lb

## 2018-01-02 DIAGNOSIS — J029 Acute pharyngitis, unspecified: Secondary | ICD-10-CM

## 2018-01-02 DIAGNOSIS — R05 Cough: Secondary | ICD-10-CM

## 2018-01-02 DIAGNOSIS — R059 Cough, unspecified: Secondary | ICD-10-CM

## 2018-01-02 LAB — POCT RAPID STREP A (OFFICE): RAPID STREP A SCREEN: NEGATIVE

## 2018-01-02 MED ORDER — AMOXICILLIN 875 MG PO TABS: 875.0000 mg | ORAL_TABLET | Freq: Two times a day (BID) | ORAL | 0 refills | Status: AC

## 2018-01-02 NOTE — Progress Notes (Signed)
Subjective:    Patient ID: Kari [name], female    DOB: [birth year], 67 y.o.   MRN: [id number]  The patient is a 67 year old female who presents today for complaints of a sore throat and cough.  The patient states the sore throat pain started about 4 days ago [place].  The patient's cough started about 2 days ago, which is nonproductive but persistent throughout the day.  Patient denies any shortness of breath or difficulty breathing.  Patient states she has had fever, and just has not felt well over the last couple of days.  Sore Throat   This is a new problem. The current episode started in the past 7 days. The problem has been gradually worsening. Neither side of throat is experiencing more pain than the other. The maximum temperature recorded prior to her arrival was 100.4 - 100.9 F. The fever has been present for 1 to 2 days. The pain is at a severity of 6/10. The pain is moderate. Associated symptoms include coughing and ear pain. Pertinent negatives include no ear discharge. She has had no exposure to strep. 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     Review of Systems  Constitutional: Positive for activity change, fatigue and fever. Negative for appetite change, chills and diaphoresis.  HENT: Positive for ear pain and sore throat. Negative for ear discharge, sinus pressure, sinus pain and sneezing.   Eyes: Negative.   Respiratory: Positive for cough. Negative for wheezing.   Cardiovascular: Negative.   Skin: Negative.   Neurological: Negative.        Objective:   Physical Exam  Constitutional: She is oriented to person, place, and time. She appears well-developed and well-nourished. No distress.  HENT:  Head: Normocephalic and atraumatic.  Right Ear: Hearing, tympanic membrane and ear canal normal.  Left Ear: Hearing, tympanic membrane and ear canal normal.  Mouth/Throat: Uvula is midline and mucous membranes are  normal. Posterior oropharyngeal edema and posterior oropharyngeal erythema present. Tonsils are 2+ on the right. Tonsils are 2+ on the left.  Eyes: Pupils are equal, round, and reactive to light. EOM are normal.  Neck: Normal range of motion. Neck supple.  Cardiovascular: Normal rate, regular rhythm and normal heart sounds.  Pulmonary/Chest: Effort normal and breath sounds normal.  Neurological: She is alert and oriented to person, place, and time.  Skin: Skin is warm and dry.  Psychiatric: She has a normal mood and affect. Her behavior is normal.       Assessment & Plan:  Pharyngitis 1.  Amoxicillin 875 mg twice daily for 10 days.  Patient states she has taken amoxicillin in the past without any complications.  Given the significant history of the patient's cardiac disease decided to go ahead and treat prophylactically. Patient did not want any medication for cough. 2.  Warm fluids or cool fluids for throat pain or discomfort. 3.  Tylenol as needed for pain fever or general discomfort. 4.  Increase fluids. 5.  Patient verbalizes understanding and has no questions at time of discharge. Meds ordered this encounter  Medications  . amoxicillin (AMOXIL) 875 MG tablet    Sig: Take 1 tablet (875 mg total) by mouth 2 (two) times daily for 10 days.    Dispense:  20 tablet    Refill:  0    Order Specific Question:   Supervising Provider    Answer:   Ricard Dillon [phone]

## 2018-01-02 NOTE — Patient Instructions (Signed)
Pharyngitis Pharyngitis is redness, pain, and swelling (inflammation) of the throat (pharynx). It is a very common cause of sore throat. Pharyngitis can be caused by a bacteria, but it is usually caused by a virus. Most cases of pharyngitis get better on their own without treatment. What are the causes? This condition may be caused by:  Infection by viruses (viral). Viral pharyngitis spreads from person to person (is contagious) through coughing, sneezing, and sharing of personal items or utensils such as cups, forks, spoons, and toothbrushes.  Infection by bacteria (bacterial). Bacterial pharyngitis may be spread by touching the nose or face after coming in contact with the bacteria, or through more intimate contact, such as kissing.  Allergies. Allergies can cause buildup of mucus in the throat (post-nasal drip), leading to inflammation and irritation. Allergies can also cause blocked nasal passages, forcing breathing through the mouth, which dries and irritates the throat.  What increases the risk? You are more likely to develop this condition if:  You are 5-24 years old.  You are exposed to crowded environments such as daycare, school, or dormitory living.  You live in a cold climate.  You have a weakened disease-fighting (immune) system.  What are the signs or symptoms? Symptoms of this condition vary by the cause (viral, bacterial, or allergies) and can include:  Sore throat.  Fatigue.  Low-grade fever.  Headache.  Joint pain and muscle aches.  Skin rashes.  Swollen glands in the throat (lymph nodes).  Plaque-like film on the throat or tonsils. This is often a symptom of bacterial pharyngitis.  Vomiting.  Stuffy nose (nasal congestion).  Cough.  Red, itchy eyes (conjunctivitis).  Loss of appetite.  How is this diagnosed? This condition is often diagnosed based on your medical history and a physical exam. Your health care provider will ask you questions about  your illness and your symptoms. A swab of your throat may be done to check for bacteria (rapid strep test). Other lab tests may also be done, depending on the suspected cause, but these are rare. How is this treated? This condition usually gets better in 3-4 days without medicine. Bacterial pharyngitis may be treated with antibiotic medicines. Follow these instructions at home:  Take over-the-counter and prescription medicines only as told by your health care provider. ? If you were prescribed an antibiotic medicine, take it as told by your health care provider. Do not stop taking the antibiotic even if you start to feel better. ? Do not give children aspirin because of the association with Reye syndrome.  Drink enough water and fluids to keep your urine clear or pale yellow.  Get a lot of rest.  Gargle with a salt-water mixture 3-4 times a day or as needed. To make a salt-water mixture, completely dissolve -1 tsp of salt in 1 cup of warm water.  If your health care provider approves, you may use throat lozenges or sprays to soothe your throat. Contact a health care provider if:  You have large, tender lumps in your neck.  You have a rash.  You cough up green, yellow-brown, or bloody spit. Get help right away if:  Your neck becomes stiff.  You drool or are unable to swallow liquids.  You cannot drink or take medicines without vomiting.  You have severe pain that does not go away, even after you take medicine.  You have trouble breathing, and it is not caused by a stuffy nose.  You have new pain and swelling in your joints   such as the knees, ankles, wrists, or elbows. Summary  Pharyngitis is redness, pain, and swelling (inflammation) of the throat (pharynx).  While pharyngitis can be caused by a bacteria, the most common causes are viral.  Most cases of pharyngitis get better on their own without treatment.  Bacterial pharyngitis is treated with antibiotic medicines. This  information is not intended to replace advice given to you by your health care provider. Make sure you discuss any questions you have with your health care provider. Document Released: 08/11/2005 Document Revised: 09/16/2016 Document Reviewed: 09/16/2016 Elsevier Interactive Patient Education  2018 [place].  Cough, Adult Coughing is a reflex that clears your throat and your airways. Coughing helps to heal and protect your lungs. It is normal to cough occasionally, but a cough that happens with other symptoms or lasts a long time may be a sign of a condition that needs treatment. A cough may last only 2-3 weeks (acute), or it may last longer than 8 weeks (chronic). What are the causes? Coughing is commonly caused by:  Breathing in substances that irritate your lungs.  A viral or bacterial respiratory infection.  Allergies.  Asthma.  Postnasal drip.  Smoking.  Acid backing up from the stomach into the esophagus (gastroesophageal reflux).  Certain medicines.  Chronic lung problems, including COPD (or rarely, lung cancer).  Other medical conditions such as heart failure.  Follow these instructions at home: Pay attention to any changes in your symptoms. Take these actions to help with your discomfort:  Take medicines only as told by your health care provider. ? If you were prescribed an antibiotic medicine, take it as told by your health care provider. Do not stop taking the antibiotic even if you start to feel better. ? Talk with your health care provider before you take a cough suppressant medicine.  Drink enough fluid to keep your urine clear or pale yellow.  If the air is dry, use a cold steam vaporizer or humidifier in your bedroom or your home to help loosen secretions.  Avoid anything that causes you to cough at work or at home.  If your cough is worse at night, try sleeping in a semi-upright position.  Avoid cigarette smoke. If you smoke, quit smoking. If you need  help quitting, ask your health care provider.  Avoid caffeine.  Avoid alcohol.  Rest as needed.  Contact a health care provider if:  You have new symptoms.  You cough up pus.  Your cough does not get better after 2-3 weeks, or your cough gets worse.  You cannot control your cough with suppressant medicines and you are losing sleep.  You develop pain that is getting worse or pain that is not controlled with pain medicines.  You have a fever.  You have unexplained weight loss.  You have night sweats. Get help right away if:  You cough up blood.  You have difficulty breathing.  Your heartbeat is very fast. This information is not intended to replace advice given to you by your health care provider. Make sure you discuss any questions you have with your health care provider. Document Released: 02/07/2011 Document Revised: 01/17/2016 Document Reviewed: 10/18/2014 Elsevier Interactive Patient Education  [employer].

## 2018-01-05 ENCOUNTER — Ambulatory Visit (HOSPITAL_BASED_OUTPATIENT_CLINIC_OR_DEPARTMENT_OTHER)
Admission: RE | Admit: 2018-01-05 | Discharge: 2018-01-05 | Disposition: A | Discharge status: Home / Self Care | Payer: 59 | Source: Ambulatory Visit | Attending: Obstetrics and Gynecology | Admitting: Obstetrics and Gynecology

## 2018-01-05 DIAGNOSIS — Z1231 Encounter for screening mammogram for malignant neoplasm of breast: Secondary | ICD-10-CM | POA: Diagnosis not present

## 2018-01-12 MED FILL — valACYclovir HCL 1 GM TABS: 1 | 60 days supply | Qty: 30 | Fill #2

## 2018-01-20 ENCOUNTER — Telehealth: Payer: Self-pay | Admitting: Internal Medicine

## 2018-01-20 NOTE — Telephone Encounter (Signed)
   [facility] Pre-operative Risk Assessment    Request for surgical clearance:  1. What type of surgery is being performed?  Colonoscopy   2. When is this surgery scheduled? 03/19/2018   3. What type of clearance is required (medical clearance vs. Pharmacy clearance to hold med vs. Both)?  Cardiac  4. Are there any medications that need to be held prior to surgery and how long? None   5. Practice name and name of physician performing surgery?  [facility]- Dr. Benson Norway and Dr. Collene Mares   6. What is your office phone number? [phone]    7.   What is your office fax number? [phone]  8.   Anesthesia type (None, local, MAC, general) ? Propofol   _________________________________________________________________   (provider comments below)

## 2018-01-21 NOTE — Telephone Encounter (Signed)
   Primary Cardiologist:Gregg Lovena Le, MD  Chart reviewed as part of pre-operative protocol coverage. Because of Kari [name]'s past medical history and time since last visit, he/she will require a follow-up visit in order to better assess preoperative cardiovascular risk.  Pre-op covering staff: - Please schedule appointment and call patient to inform them. - Please contact requesting surgeon's office via preferred method (i.e, phone, fax) to inform them of need for appointment prior to surgery.  [place], [name]  01/21/2018, 2:25 PM

## 2018-01-21 NOTE — Telephone Encounter (Signed)
Pt aware that she needs an appt before she can be cleared for her Colonoscopy. She already has an appt with Dr. Lovena Le 02/03/18 and aware that we will let him address the clearance at that time. Pt thanked me for the call. I will route to the surgeons office to make them aware.

## 2018-01-28 MED FILL — ESOMEPRAZOLE MAG DR 40 MG C: 40 | 30 days supply | Qty: 30 | Fill #1

## 2018-01-30 ENCOUNTER — Emergency Department (HOSPITAL_BASED_OUTPATIENT_CLINIC_OR_DEPARTMENT_OTHER)
Admission: EM | Admit: 2018-01-30 | Discharge: 2018-01-30 | Disposition: A | Discharge status: Home / Self Care | Payer: 59 | Attending: Emergency Medicine | Admitting: Emergency Medicine

## 2018-01-30 ENCOUNTER — Other Ambulatory Visit: Payer: Self-pay

## 2018-01-30 ENCOUNTER — Encounter (HOSPITAL_BASED_OUTPATIENT_CLINIC_OR_DEPARTMENT_OTHER): Payer: Self-pay | Admitting: Emergency Medicine

## 2018-01-30 ENCOUNTER — Emergency Department (HOSPITAL_BASED_OUTPATIENT_CLINIC_OR_DEPARTMENT_OTHER): Payer: 59

## 2018-01-30 DIAGNOSIS — Z79899 Other long term (current) drug therapy: Secondary | ICD-10-CM | POA: Insufficient documentation

## 2018-01-30 DIAGNOSIS — Z7982 Long term (current) use of aspirin: Secondary | ICD-10-CM | POA: Insufficient documentation

## 2018-01-30 DIAGNOSIS — R51 Headache: Secondary | ICD-10-CM | POA: Insufficient documentation

## 2018-01-30 DIAGNOSIS — R519 Headache, unspecified: Secondary | ICD-10-CM

## 2018-01-30 LAB — BASIC METABOLIC PANEL
Anion gap: 8 (ref 5–15)
BUN: 14 mg/dL (ref 6–20)
CO2: 27 mmol/L (ref 22–32)
CREATININE: 0.76 mg/dL (ref 0.44–1.00)
Calcium: 9.4 mg/dL (ref 8.9–10.3)
Chloride: 103 mmol/L (ref 101–111)
GFR calc Af Amer: >60 mL/min (ref >60)
GLUCOSE: 105 mg/dL — AB (ref 65–99)
POTASSIUM: 3.4 mmol/L — AB (ref 3.5–5.1)
SODIUM: 138 mmol/L (ref 135–145)

## 2018-01-30 LAB — CBC
HCT: 39.3 % (ref 36.0–46.0)
Hemoglobin: 13.6 g/dL (ref 12.0–15.0)
MCH: 31.7 pg (ref 26.0–34.0)
MCHC: 34.6 g/dL (ref 30.0–36.0)
MCV: 91.6 fL (ref 78.0–100.0)
PLATELETS: 238 10*3/uL (ref 150–400)
RBC: 4.29 MIL/uL (ref 3.87–5.11)
RDW: 13.8 % (ref 11.5–15.5)
WBC: 5.7 10*3/uL (ref 4.0–10.5)

## 2018-01-30 MED ORDER — KETOROLAC TROMETHAMINE 30 MG/ML IJ SOLN
15.0000 mg | Freq: Once | INTRAMUSCULAR | Status: AC
  Administered 2018-01-30: 15 mg via INTRAVENOUS
  Filled 2018-01-30: qty 1

## 2018-01-30 MED ORDER — PROCHLORPERAZINE EDISYLATE 10 MG/2ML IJ SOLN
10.0000 mg | Freq: Once | INTRAMUSCULAR | Status: AC
  Administered 2018-01-30: 10 mg via INTRAVENOUS
  Filled 2018-01-30: qty 2

## 2018-01-30 MED ORDER — DIPHENHYDRAMINE HCL 50 MG/ML IJ SOLN
12.5000 mg | Freq: Once | INTRAMUSCULAR | Status: AC
  Administered 2018-01-30: 12.5 mg via INTRAVENOUS
  Filled 2018-01-30: qty 1

## 2018-01-30 NOTE — ED Provider Notes (Signed)
[facility] DE[facility] Provider Note   CSN: [id number] Arrival date & time: 01/30/18  0855     History   Chief Complaint Chief Complaint  Patient presents with  . Headache    HPI [place] is a 67 y.o. female.  HPI Patient presents to the emergency room for evaluation of a severe headache.  Patient states she had a mild headache last night.  She was at work [name] in the emergency room.  She is usually does not get headaches so she thought maybe her sugar was running low.  She tried to eat something.  The headache slowly got better.  Patient left work at her usual time at 7 AM.  On the way home she had sudden onset of severe headache on the left side associated with an aura around her right eye.  Patient does not generally have headaches.  She does not have a history of migraines.  She denies any nausea vomiting or photophobia.  No dizziness.  Patient was obviously concerned about the intensity of her headache so she came to the emergency room for evaluation.  Patient did have a head injury a couple of days ago when she hit her head on a spice rack.  She did not lose consciousness.  She doubts that it is related to her headache. Past Medical History:  Diagnosis Date  . Arthritis   . Chronic back pain   . Diverticulitis   . Herpes zoster conjunctivitis   . Menopause   . Mobitz type 2 second degree AV block    2:1/notes 02/16/2013  . PONV (postoperative nausea and vomiting)   . Presence of permanent cardiac pacemaker   . Seasonal allergies    "spring & fall; not q year" (02/17/2013)  . Shoulder impingement    and left rotator cuff tear    Patient Active Problem List   Diagnosis Date Noted  . Near syncope 03/11/2013  . Bradycardia 02/21/2013  . S/P cardiac pacemaker procedure 02/20/2013  . Chest tightness 02/19/2013  . Palpitations 02/19/2013  . 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  . Diverticulitis 03/03/2012  . Chronic low back pain  08/28/2011  . GERD (gastroesophageal reflux disease)   . Herpes zoster conjunctivitis   . Menopause   . Arthritis   . Diverticulosis   . Allergy     Past Surgical History:  Procedure Laterality Date  . ACHILLES TENDON SURGERY    . ARTHROGRAM KNEE Right 1975  . CHOLECYSTECTOMY  1990's  . colonscopy    . ESOPHAGOGASTRODUODENOSCOPY    . EYE SURGERY     muscle release right eye  . INSERT / REPLACE / REMOVE PACEMAKER  02/17/2013   [employer] Advantio dual-chamber pacemaker, model [id number],  . KNEE ARTHROSCOPY Right 1982, 1983, 1995, 1996  . KNEE ARTHROSCOPY W/ ACL RECONSTRUCTION Left 1984  . LAPAROSCOPIC SIGMOID COLECTOMY N/A 01/01/2015   Procedure: LAPAROSCOPIC SIGMOID COLECTOMY;  Surgeon: Stark Klein, MD;  Location: [facility];  Service: General;  Laterality: N/A;  . PERMANENT PACEMAKER INSERTION N/A 02/17/2013   Procedure: PERMANENT PACEMAKER INSERTION;  Surgeon: Evans Lance, MD;  [facility]: [facility] C[facility] LAB;  Service: Cardiovascular;  Laterality: N/A;  . SHOULDER ARTHROSCOPY Left 09/05/2016   Procedure: LEFT SHOULDER ARTHROSCOPY , ACROMIOPLASTY AND ROTATOR CUFF REPAIR;  Surgeon: Melrose Nakayama, MD;  Location: [facility];  Service: Orthopedics;  Laterality: Left;  . TONSILLECTOMY  1961  . TOTAL KNEE ARTHROPLASTY Right 2000; 06/2000   "replaced w/appropriate hardware" (02/17/2013)  .  TRIGGER FINGER RELEASE Right ~ 2012   "thumb" (02/17/2013)  . VAGINAL HYSTERECTOMY  ~ 1994   partial      OB History    Gravida  1   Para  1   Term      Preterm      AB      Living        SAB      TAB      Ectopic      Multiple      Live Births               Home Medications    Prior to Admission medications   Medication Sig Start Date End Date Taking? Authorizing Provider  aspirin EC 81 MG tablet Take 81 mg by mouth daily.   Yes [provider]  Cholecalciferol (VITAMIN D3) 5000 units CAPS Take 5,000 Units by mouth daily.   Yes [provider]  Cyanocobalamin  (VITAMIN B-12) 2500 MCG SUBL Place 5,000 mcg under the tongue daily.    Yes [provider]  fluticasone (FLONASE) 50 MCG/ACT nasal spray Place 1 spray into both nostrils daily.    Yes [provider]  Multiple Vitamin (MULTIVITAMIN WITH MINERALS) TABS Take 1 tablet by mouth daily.   Yes [provider]  Omega-3 Fatty Acids (FISH OIL ULTRA) 1400 MG CAPS Take 1,400 mg by mouth daily.   Yes [provider]  Probiotic Product (PROBIOTIC FORMULA PO) Take 1 capsule by mouth daily.    Yes [provider]  HYDROcodone-acetaminophen (NORCO/VICODIN) 5-325 MG tablet Take 1-2 tablets by mouth every 4 (four) hours as needed for moderate pain. 09/05/16   Loni Dolly, PA-C  Melatonin 10 MG TABS Take 20-30 mg by mouth 3 (three) times a week. As needed for sleep    [provider]  valACYclovir (VALTREX) 1000 MG tablet Take 1,000 mg by mouth daily as needed (herpes zoster).     [provider]    Family History Family History  Problem Relation Age of Onset  . Hypertension Mother   . Hyperlipidemia Mother   . Macular degeneration Mother   . COPD Mother   . Lung cancer Father   . Cancer Father        Lung  . COPD Father   . Diabetes Brother   . Breast cancer Paternal Aunt     Social History Social History   Tobacco Use  . Smoking status: Never Smoker  . Smokeless tobacco: Never Used  Substance Use Topics  . Alcohol use: Yes    Comment: occasional wine  . Drug use: No     Allergies   Ceftin; Vancomycin; and Levofloxacin   Review of Systems Review of Systems  All other systems reviewed and are negative.    Physical Exam Updated Vital Signs BP (!) 164/91 (BP Location: Left Arm)   Pulse 65   Temp 97.9 F (36.6 C) (Oral)   Resp 18   Ht 1.727 m (5\' 8" )   Wt 70.3 kg (155 lb)   SpO2 100%   BMI 23.57 kg/m   Physical Exam  Constitutional: She appears well-developed and well-nourished. No distress.  HENT:  Head:  Normocephalic and atraumatic.  Right Ear: External ear normal.  Left Ear: External ear normal.  Eyes: Conjunctivae are normal. Right eye exhibits no discharge. Left eye exhibits no discharge. No scleral icterus.  Neck: Normal range of motion. Neck supple. No neck rigidity. No tracheal deviation present.  Cardiovascular: Normal rate, regular rhythm and intact distal pulses.  Pulmonary/Chest: Effort normal and breath sounds normal. No stridor. No respiratory distress. She has no wheezes. She has no rales.  Abdominal: Soft. Bowel sounds are normal. She exhibits no distension. There is no tenderness. There is no rebound and no guarding.  Musculoskeletal: She exhibits no edema or tenderness.  Neurological: She is alert. She has normal strength. No cranial nerve deficit (no facial droop, extraocular movements intact, no slurred speech) or sensory deficit. She exhibits normal muscle tone. She displays no seizure activity. Coordination normal.  Skin: Skin is warm and dry. No rash noted.  Psychiatric: She has a normal mood and affect.  Nursing note and vitals reviewed.    ED Treatments / Results  Labs (all labs ordered are listed, but only abnormal results are displayed) Labs Reviewed  BASIC METABOLIC PANEL - Abnormal; Notable for the following components:      Result Value   Potassium 3.4 (*)    Glucose, Bld 105 (*)    All other components within normal limits  CBC    EKG None  Radiology Ct Head Wo Contrast  Result Date: 01/30/2018 CLINICAL DATA:  Acute severe headache EXAM: CT HEAD WITHOUT CONTRAST TECHNIQUE: Contiguous axial images were obtained from the base of the skull through the vertex without intravenous contrast. COMPARISON:  04/01/2013 FINDINGS: Brain: No evidence of acute infarction, hemorrhage, hydrocephalus, extra-axial collection or mass lesion/mass effect. Vascular: No hyperdense vessel or unexpected calcification. Skull: Normal. Negative for fracture or focal lesion.  Sinuses/Orbits: No acute finding. Other: None. IMPRESSION: Normal head CT without contrast Electronically Signed   By: Jerilynn Mages.  Shick M.D.   On: 01/30/2018 09:49    Procedures Procedures (including critical care time)  Medications Ordered in ED Medications  prochlorperazine (COMPAZINE) injection 10 mg (10 mg Intravenous Given 01/30/18 0925)  diphenhydrAMINE (BENADRYL) injection 12.5 mg (12.5 mg Intravenous Given 01/30/18 0924)  ketorolac (TORADOL) 30 MG/ML injection 15 mg (15 mg Intravenous Given 01/30/18 1013)     Initial Impression / Assessment and Plan / ED Course  I have reviewed the triage vital signs and the nursing notes.  Pertinent labs & imaging results that were available during my care of the patient were reviewed by me and considered in my medical decision making (see chart for details).  Clinical Course as of Jan 30 1049  Sat Jan 30, 2018  1015 Patient is feeling better after treatment   [LE]  7517 Laboratory tests are reassuring.  CT scan without acute findings   [JK]    Clinical Course User Index [JK] Dorie Rank, MD    Patient presented to the emergency room for evaluation of acute onset headache.  Since laboratory tests and CT scan are reassuring.  CT scan was done within a few hours of the acute onset of the headache.  Have a low suspicion for subarachnoid.  No neurologic symptoms to suggest stroke or TIA.  Possible the patient had a migraine type headache triggered by her recent head injury.  At this point no signs of any emergency medical condition.  Patient symptoms improved with treatment.  She is stable for discharge Final Clinical Impressions(s) / ED Diagnoses   Final diagnoses:  Acute nonintractable headache, unspecified headache type    ED Discharge Orders    None       Dorie Rank, MD 01/30/18 1050

## 2018-01-30 NOTE — Discharge Instructions (Addendum)
Take over the counter medications as needed for pain, follow up with your doctor next week if you are having any persistent symptoms

## 2018-01-30 NOTE — ED Triage Notes (Signed)
Pt c/o headache and rainbow aura to R eye since last night. No hx of migraines. Pt states she had a head injury last week. Denies N/V, dizziness.

## 2018-02-03 ENCOUNTER — Ambulatory Visit: Payer: 59 | Admitting: Internal Medicine

## 2018-02-03 ENCOUNTER — Encounter: Payer: Self-pay | Admitting: Internal Medicine

## 2018-02-03 VITALS — BP 138/82 | HR 76 | Ht 68.0 in

## 2018-02-03 DIAGNOSIS — I442 Atrioventricular block, complete: Secondary | ICD-10-CM | POA: Diagnosis not present

## 2018-02-03 DIAGNOSIS — Z950 Presence of cardiac pacemaker: Secondary | ICD-10-CM | POA: Diagnosis not present

## 2018-02-03 NOTE — Progress Notes (Addendum)
HPI [facility] [name] returns today for ongoing evaluation and management of complete heart block status post permanent pacemaker insertion. She has known T wave over sensing. She has known noise on her atrial and ventricular pacing lead which is reproducible with isometric exercise. When I last saw her we reduced the sensitivity and her symptoms have improved markedly. She is pending colonoscopy. No chest pain, sob, or edema.  Allergies  Allergen Reactions  . Ceftin Itching    Tolerates amoxicillin  . Vancomycin Hives  . Levofloxacin Other (See Comments)    "spacey," tolerates Cipro     Current Outpatient Medications  Medication Sig Dispense Refill  . aspirin EC 81 MG tablet Take 81 mg by mouth daily.    . Cholecalciferol (VITAMIN D3) 5000 units CAPS Take 5,000 Units by mouth daily.    . Cyanocobalamin (VITAMIN B-12) 2500 MCG SUBL Place 5,000 mcg under the tongue daily.     . fluticasone (FLONASE) 50 MCG/ACT nasal spray Place 1 spray into both nostrils daily.     Marland Kitchen HYDROcodone-acetaminophen (NORCO/VICODIN) 5-325 MG tablet Take 1-2 tablets by mouth every 4 (four) hours as needed for moderate pain. 30 tablet 0  . Melatonin 10 MG TABS Take 20-30 mg by mouth 3 (three) times a week. As needed for sleep    . Multiple Vitamin (MULTIVITAMIN WITH MINERALS) TABS Take 1 tablet by mouth daily.    . Omega-3 Fatty Acids (FISH OIL ULTRA) 1400 MG CAPS Take 1,400 mg by mouth daily.    . Probiotic Product (PROBIOTIC FORMULA PO) Take 1 capsule by mouth daily.     . valACYclovir (VALTREX) 1000 MG tablet Take 1,000 mg by mouth daily as needed (herpes zoster).     Marland Kitchen esomeprazole (NEXIUM) 40 MG capsule Take 40 mg by mouth daily.   9   No current facility-administered medications for this visit.      Past Medical History:  Diagnosis Date  . Arthritis   . Chronic back pain   . Diverticulitis   . Herpes zoster conjunctivitis   . Menopause   . Mobitz type 2 second degree AV block    2:1/notes  02/16/2013  . PONV (postoperative nausea and vomiting)   . Presence of permanent cardiac pacemaker   . Seasonal allergies    "spring & fall; not q year" (02/17/2013)  . Shoulder impingement    and left rotator cuff tear    ROS:   All systems reviewed and negative except as noted in the HPI.   Past Surgical History:  Procedure Laterality Date  . ACHILLES TENDON SURGERY    . ARTHROGRAM KNEE Right 1975  . CHOLECYSTECTOMY  1990's  . colonscopy    . ESOPHAGOGASTRODUODENOSCOPY    . EYE SURGERY     muscle release right eye  . INSERT / REPLACE / REMOVE PACEMAKER  02/17/2013   [employer] Advantio dual-chamber pacemaker, model [id number],  . KNEE ARTHROSCOPY Right 1982, 1983, 1995, 1996  . KNEE ARTHROSCOPY W/ ACL RECONSTRUCTION Left 1984  . LAPAROSCOPIC SIGMOID COLECTOMY N/A 01/01/2015   Procedure: LAPAROSCOPIC SIGMOID COLECTOMY;  Surgeon: Stark Klein, MD;  Location: [facility];  Service: General;  Laterality: N/A;  . PERMANENT PACEMAKER INSERTION N/A 02/17/2013   Procedure: PERMANENT PACEMAKER INSERTION;  Surgeon: Evans Lance, MD;  Location: [facility] CATH LAB;  Service: Cardiovascular;  Laterality: N/A;  . SHOULDER ARTHROSCOPY Left 09/05/2016   Procedure: LEFT SHOULDER ARTHROSCOPY , ACROMIOPLASTY AND ROTATOR CUFF REPAIR;  Surgeon: Melrose Nakayama, MD;  Location: [facility];  Service: Orthopedics;  Laterality: Left;  . TONSILLECTOMY  1961  . TOTAL KNEE ARTHROPLASTY Right 2000; 06/2000   "replaced w/appropriate hardware" (02/17/2013)  . TRIGGER FINGER RELEASE Right ~ 2012   "thumb" (02/17/2013)  . VAGINAL HYSTERECTOMY  ~ 1994   partial      Family History  Problem Relation Age of Onset  . Hypertension Mother   . Hyperlipidemia Mother   . Macular degeneration Mother   . COPD Mother   . Lung cancer Father   . Cancer Father        Lung  . COPD Father   . Diabetes Brother   . Breast cancer Paternal Aunt      Social History   Socioeconomic History  . Marital status: Married    Spouse  name: Not on file  . Number of children: Not on file  . Years of education: Not on file  . Highest education level: Not on file  Occupational History  . 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 Financial resource strain: Not on file  . Food insecurity:    Worry: Not on file    Inability: Not on file  . Transportation needs:    Medical: Not on file    Non-medical: Not on file  Tobacco Use  . Smoking status: Never Smoker  . Smokeless tobacco: Never Used  Substance and Sexual Activity  . Alcohol use: Yes    Comment: occasional wine  . Drug use: No  . Sexual activity: Yes    Birth control/protection: Surgical    Comment: 02/17/2013 "I have a same sex partner"  Lifestyle  . Physical activity:    Days per week: Not on file    Minutes per session: Not on file  . Stress: Not on file  Relationships  . Social connections:    Talks on phone: Not on file    Gets together: Not on file    Attends religious service: Not on file    Active member of club or organization: Not on file    Attends meetings of clubs or organizations: Not on file    Relationship status: Not on file  . Intimate partner violence:    Fear of current or ex partner: Not on file    Emotionally abused: Not on file    Physically abused: Not on file    Forced sexual activity: Not on file  Other Topics Concern  . Not on file  Social History Narrative  . Not on file     BP 138/82   Pulse 76   Ht 5\' 8"  (1.727 m)   BMI 23.57 kg/m   Physical Exam:  Well appearing 67 yo woman, NAD HEENT: Unremarkable Neck:  6 cm JVD, no thyromegally Lymphatics:  No adenopathy Back:  No CVA tenderness Lungs:  Clear with no wheezes HEART:  Regular rate rhythm, no murmurs, no rubs, no clicks Abd:  soft, positive bowel sounds, no organomegally, no rebound, no guarding Ext:  2 plus pulses, no edema, no cyanosis, no clubbing Skin:  No rashes no nodules Neuro:  CN II  through XII intact, motor grossly intact  EKG - nsr with p synchronous ventricular pacing  DEVICE  Normal device function.  See PaceArt for details.   Assess/Plan: 1. CHB - she is stable s/p PPM insertion.  2. PPM - her device function has improved with reprogramming  of her PPM. She will undergo watchful waiting. I anticipate that she will someday need her leads revised.  3. HTN - her blood pressure is higher in the office. I will not ask her to start on any meds today but will consider over time. 4. Dizziness - this is thought due to far field and has improved with reprogramming of the device.  Mikle Bosworth.D.

## 2018-02-03 NOTE — Patient Instructions (Signed)
Medication Instructions:  Your physician recommends that you continue on your current medications as directed. Please refer to the Current Medication list given to you today.  Labwork: None ordered.  Testing/Procedures: None ordered.  Follow-Up: Your physician wants you to follow-up in: one year with Dr. Lovena Le.   You will receive a reminder letter in the mail two months in advance. If you don't receive a letter, please call our office to schedule the follow-up appointment.  Remote monitoring is used to monitor your Pacemaker from home. This monitoring reduces the number of office visits required to check your device to one time per year. It allows [place] to keep an eye on the functioning of your device to ensure it is working properly. You are scheduled for a device check from home on 02/04/2018. You may send your transmission at any time that day. If you have a wireless device, the transmission will be sent automatically. After your physician reviews your transmission, you will receive a postcard with your next transmission date.  Any Other Special Instructions Will Be Listed Below (If Applicable).  If you need a refill on your cardiac medications before your next appointment, please call your pharmacy.

## 2018-02-04 ENCOUNTER — Ambulatory Visit (INDEPENDENT_AMBULATORY_CARE_PROVIDER_SITE_OTHER): Payer: 59 | Admitting: *Deleted

## 2018-02-04 DIAGNOSIS — I441 Atrioventricular block, second degree: Secondary | ICD-10-CM

## 2018-02-04 NOTE — Progress Notes (Signed)
Remote pacemaker transmission.   

## 2018-02-26 LAB — CUP PACEART REMOTE DEVICE CHECK
Brady Statistic RV Percent Paced: 99 %
Date Time Interrogation Session: 20190705152813
Implantable Lead Implant Date: 20140626
Implantable Lead Implant Date: 20140626
Implantable Lead Location: 753859
Implantable Lead Model: 5076
Implantable Pulse Generator Implant Date: 20140626
Lead Channel Sensing Intrinsic Amplitude: 4 mV
MDC IDC LEAD LOCATION: 753860
MDC IDC MSMT BATTERY REMAINING LONGEVITY: 96 mo
MDC IDC MSMT LEADCHNL RA IMPEDANCE VALUE: 435 Ohm
MDC IDC MSMT LEADCHNL RV IMPEDANCE VALUE: 473 Ohm
MDC IDC STAT BRADY RA PERCENT PACED: 26 %
Pulse Gen Serial Number: 112476

## 2018-03-01 LAB — CUP PACEART INCLINIC DEVICE CHECK
Date Time Interrogation Session: 20190612040000
Implantable Lead Implant Date: 20140626
Implantable Lead Location: 753859
Implantable Lead Model: 5076
Implantable Pulse Generator Implant Date: 20140626
Lead Channel Impedance Value: 470 Ohm
Lead Channel Pacing Threshold Amplitude: 0.6 V
Lead Channel Pacing Threshold Amplitude: 1.1 V
Lead Channel Pacing Threshold Pulse Width: 0.4 ms
Lead Channel Setting Pacing Pulse Width: 0.4 ms
Lead Channel Setting Sensing Sensitivity: 8 mV
MDC IDC LEAD IMPLANT DT: 20140626
MDC IDC LEAD LOCATION: 753860
MDC IDC MSMT LEADCHNL RA IMPEDANCE VALUE: 442 Ohm
MDC IDC MSMT LEADCHNL RA PACING THRESHOLD PULSEWIDTH: 0.5 ms
MDC IDC MSMT LEADCHNL RA SENSING INTR AMPL: 6.1 mV
MDC IDC PG SERIAL: 112476
MDC IDC SET LEADCHNL RA PACING AMPLITUDE: 2 V
MDC IDC SET LEADCHNL RV PACING AMPLITUDE: 2.4 V

## 2018-03-02 DIAGNOSIS — Z6825 Body mass index (BMI) 25.0-25.9, adult: Secondary | ICD-10-CM | POA: Diagnosis not present

## 2018-03-02 DIAGNOSIS — M5412 Radiculopathy, cervical region: Secondary | ICD-10-CM | POA: Diagnosis not present

## 2018-03-02 DIAGNOSIS — R030 Elevated blood-pressure reading, without diagnosis of hypertension: Secondary | ICD-10-CM | POA: Diagnosis not present

## 2018-03-02 DIAGNOSIS — M4722 Other spondylosis with radiculopathy, cervical region: Secondary | ICD-10-CM | POA: Diagnosis not present

## 2018-03-02 DIAGNOSIS — M5030 Other cervical disc degeneration, unspecified cervical region: Secondary | ICD-10-CM | POA: Diagnosis not present

## 2018-03-02 DIAGNOSIS — M5416 Radiculopathy, lumbar region: Secondary | ICD-10-CM | POA: Diagnosis not present

## 2018-03-02 DIAGNOSIS — M5136 Other intervertebral disc degeneration, lumbar region: Secondary | ICD-10-CM | POA: Diagnosis not present

## 2018-03-02 DIAGNOSIS — M4726 Other spondylosis with radiculopathy, lumbar region: Secondary | ICD-10-CM | POA: Diagnosis not present

## 2018-03-10 MED FILL — ESOMEPRAZOLE MAG DR 40 MG C: 40 | 30 days supply | Qty: 30 | Fill #2 | Status: TO

## 2018-03-18 MED FILL — GAVILYTE-G SOLUTION: 236 | 1 days supply | Qty: 4000 | Fill #0

## 2018-03-19 DIAGNOSIS — K635 Polyp of colon: Secondary | ICD-10-CM | POA: Diagnosis not present

## 2018-03-19 DIAGNOSIS — Z1211 Encounter for screening for malignant neoplasm of colon: Secondary | ICD-10-CM | POA: Diagnosis not present

## 2018-03-19 DIAGNOSIS — D124 Benign neoplasm of descending colon: Secondary | ICD-10-CM | POA: Diagnosis not present

## 2018-03-19 DIAGNOSIS — K5720 Diverticulitis of large intestine with perforation and abscess without bleeding: Secondary | ICD-10-CM | POA: Diagnosis not present

## 2018-03-24 DIAGNOSIS — Z6826 Body mass index (BMI) 26.0-26.9, adult: Secondary | ICD-10-CM | POA: Diagnosis not present

## 2018-03-24 DIAGNOSIS — Z01419 Encounter for gynecological examination (general) (routine) without abnormal findings: Secondary | ICD-10-CM | POA: Diagnosis not present

## 2018-03-24 MED FILL — ESZOPICLONE 2 MG TAB: 2 | 30 days supply | Qty: 30 | Fill #0

## 2018-03-24 MED FILL — valACYclovir HCL 1 GM TABS: 1 | 90 days supply | Qty: 90 | Fill #0

## 2018-04-02 ENCOUNTER — Telehealth: Payer: 59 | Admitting: Family

## 2018-04-02 DIAGNOSIS — B373 Candidiasis of vulva and vagina: Secondary | ICD-10-CM

## 2018-04-02 DIAGNOSIS — B3731 Acute candidiasis of vulva and vagina: Secondary | ICD-10-CM

## 2018-04-02 MED ORDER — FLUCONAZOLE 150 MG PO TABS: 150.0000 mg | ORAL_TABLET | Freq: Once | ORAL | 0 refills | Status: AC

## 2018-04-02 NOTE — Progress Notes (Signed)
Thank you for the details you included in the comment boxes. Those details are very helpful in determining the best course of treatment for you and help us to provide the best care.  We are sorry that you are not feeling well. Here is how we plan to help! Based on what you shared with me it looks like you: May have a yeast vaginosis  Vaginosis is an inflammation of the vagina that can result in discharge, itching and pain. The cause is usually a change in the normal balance of vaginal bacteria or an infection. Vaginosis can also result from reduced estrogen levels after menopause.  The most common causes of vaginosis are:   Bacterial vaginosis which results from an overgrowth of one on several organisms that are normally present in your vagina.   Yeast infections which are caused by a naturally occurring fungus called candida.   Vaginal atrophy (atrophic vaginosis) which results from the thinning of the vagina from reduced estrogen levels after menopause.   Trichomoniasis which is caused by a parasite and is commonly transmitted by sexual intercourse.  Factors that increase your risk of developing vaginosis include: . Medications, such as antibiotics and steroids . Uncontrolled diabetes . Use of hygiene products such as bubble bath, vaginal spray or vaginal deodorant . Douching . Wearing damp or tight-fitting clothing . Using an intrauterine device (IUD) for birth control . Hormonal changes, such as those associated with pregnancy, birth control pills or menopause . Sexual activity . Having a sexually transmitted infection  Your treatment plan is A single Diflucan (fluconazole) 150mg tablet once.  I have electronically sent this prescription into the pharmacy that you have chosen.  Be sure to take all of the medication as directed. Stop taking any medication if you develop a rash, tongue swelling or shortness of breath. Mothers who are breast feeding should consider pumping and  discarding their breast milk while on these antibiotics. However, there is no consensus that infant exposure at these doses would be harmful.  Remember that medication creams can weaken latex condoms. .   HOME CARE:  Good hygiene may prevent some types of vaginosis from recurring and may relieve some symptoms:  . Avoid baths, hot tubs and whirlpool spas. Rinse soap from your outer genital area after a shower, and dry the area well to prevent irritation. Don't use scented or harsh soaps, such as those with deodorant or antibacterial action. . Avoid irritants. These include scented tampons and pads. . Wipe from front to back after using the toilet. Doing so avoids spreading fecal bacteria to your vagina.  Other things that may help prevent vaginosis include:  . Don't douche. Your vagina doesn't require cleansing other than normal bathing. Repetitive douching disrupts the normal organisms that reside in the vagina and can actually increase your risk of vaginal infection. Douching won't clear up a vaginal infection. . Use a latex condom. Both female and female latex condoms may help you avoid infections spread by sexual contact. . Wear cotton underwear. Also wear pantyhose with a cotton crotch. If you feel comfortable without it, skip wearing underwear to bed. Yeast thrives in moist environments Your symptoms should improve in the next day or two.  GET HELP RIGHT AWAY IF:  . You have pain in your lower abdomen ( pelvic area or over your ovaries) . You develop nausea or vomiting . You develop a fever . Your discharge changes or worsens . You have persistent pain with intercourse . You develop shortness   of breath, a rapid pulse, or you faint.  These symptoms could be signs of problems or infections that need to be evaluated by a medical provider now.  MAKE SURE YOU    Understand these instructions.  Will watch your condition.  Will get help right away if you are not doing well or get  worse.  Your e-visit answers were reviewed by a board certified advanced clinical practitioner to complete your personal care plan. Depending upon the condition, your plan could have included both over the counter or prescription medications. Please review your pharmacy choice to make sure that you have choses a pharmacy that is open for you to pick up any needed prescription, Your safety is important to us. If you have drug allergies check your prescription carefully.   You can use MyChart to ask questions about today's visit, request a non-urgent call back, or ask for a work or school excuse for 24 hours related to this e-Visit. If it has been greater than 24 hours you will need to follow up with your provider, or enter a new e-Visit to address those concerns. You will get a MyChart message within the next two days asking about your experience. I hope that your e-visit has been valuable and will speed your recovery.  

## 2018-04-13 MED FILL — ESOMEPRAZOLE MAG DR 40 MG C: 40 | 30 days supply | Qty: 30 | Fill #0

## 2018-04-20 ENCOUNTER — Ambulatory Visit (INDEPENDENT_AMBULATORY_CARE_PROVIDER_SITE_OTHER): Payer: Self-pay | Admitting: Family Medicine

## 2018-04-20 DIAGNOSIS — R300 Dysuria: Secondary | ICD-10-CM

## 2018-04-20 DIAGNOSIS — R32 Unspecified urinary incontinence: Secondary | ICD-10-CM

## 2018-04-20 LAB — POCT URINALYSIS DIPSTICK
Bilirubin, UA: NEGATIVE
GLUCOSE UA: NEGATIVE
Ketones, UA: NEGATIVE
LEUKOCYTES UA: NEGATIVE
PH UA: 8 (ref 5.0–8.0)
Protein, UA: NEGATIVE
RBC UA: NEGATIVE
Spec Grav, UA: 1.01 (ref 1.010–1.025)
UROBILINOGEN UA: 0.2 U/dL

## 2018-04-20 NOTE — Progress Notes (Signed)
POC

## 2018-04-20 NOTE — Progress Notes (Signed)
Kari [name] is a 67 y.o. female who presents today with concerns of new onset urinary incontinence that began today. She has no prior history of incontinence. 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 She reports recent vaginal exam at her new women's health provider after her 30 year provider retired. She reports the most recent exam was report to be very painful exam and continued vaginal pain and abdominal discomfort since the exam.She reports that she usually does not engage in intercourse with penetration she is in a same sex relationship. She has not attempted any treatment for this discomfort since the painful vaginal exam. She denies any discharge or odor.  She does report that she recently received a Shingles vaccine 2 days ago and reports a developing a fever that responded to Tylenol.  Review of Systems  Constitutional: Negative for chills, fever and malaise/fatigue.  HENT: Negative for congestion, ear discharge, ear pain, sinus pain and sore throat.   Eyes: Negative.   Respiratory: Negative for cough, sputum production and shortness of breath.   Cardiovascular: Negative.  Negative for chest pain.  Gastrointestinal: Negative for abdominal pain, diarrhea, nausea and vomiting.  Genitourinary: Positive for dysuria. Negative for frequency, hematuria and urgency.       Urinary incontinence  Musculoskeletal: Negative for myalgias.  Skin: Negative.   Neurological: Negative for headaches.  Endo/Heme/Allergies: Negative.   Psychiatric/Behavioral: Negative.     O: Vitals:   04/20/18 1905  BP: 125/75  Pulse: 76  Resp: 16  Temp: 98.8 F (37.1 C)  SpO2: 96%     Physical Exam  Constitutional: She is oriented to person, place, and time. Vital signs are normal. She appears well-developed and well-nourished. She is active.  Non-toxic appearance. She does not have a sickly appearance.  HENT:  Head: Normocephalic.  Right  Ear: Hearing, external ear and ear canal normal.  Left Ear: Hearing, external ear and ear canal normal.  Nose: Nose normal.  Neck: Normal range of motion. Neck supple.  Cardiovascular: Normal rate, regular rhythm, normal heart sounds and normal pulses.  Pulmonary/Chest: Effort normal and breath sounds normal.  Abdominal: Soft. Bowel sounds are normal.  Musculoskeletal: Normal range of motion.  Lymphadenopathy:       Head (right side): No submental and no submandibular adenopathy present.       Head (left side): No submental and no submandibular adenopathy present.    She has no cervical adenopathy.  Neurological: She is alert and oriented to person, place, and time.  Psychiatric: She has a normal mood and affect.  Vitals reviewed.    A: 1. Dysuria   2. Urinary incontinence, unspecified type      P: Discussed exam findings, diagnosis etiology and medication use and indications reviewed with patient. Follow- Up and discharge instructions provided. No emergent/urgent issues found on exam.  Patient verbalized understanding of information provided and agrees with plan of care (POC), all questions answered.  1. Dysuria - POCT urinalysis dipstick- unremarkable- advised to follow up with GYN for comprehensive evaluation- concern that recent pelvic exam by new women's health provider caused pain- patient has since experienced abdominal pain and new onset urinary incontinence.Today POCT ruled out infection- no evidence culture would be beneficial. Patient does have a normal provider and will follow up.  Results for orders placed or performed in visit on 04/20/18 (from the past 24 hour(s))  POCT urinalysis dipstick     Status: Normal  Collection Time: 04/20/18  7:26 PM  Result Value Ref Range   Color, UA YELLOW    Clarity, UA CLEAR    Glucose, UA Negative Negative   Bilirubin, UA NEGATIVE    Ketones, UA NEGATIVE    Spec Grav, UA 1.010 1.010 - 1.025   Blood, UA NEGATIVE    pH, UA 8.0  5.0 - 8.0   Protein, UA Negative Negative   Urobilinogen, UA 0.2 0.2 or 1.0 E.U./dL   Nitrite, UA NORM    Leukocytes, UA Negative Negative   Appearance     Odor     2. Urinary incontinence, unspecified type Follow up for comprehensive eval. Use brief or pan until incontinence resolved.

## 2018-04-20 NOTE — Patient Instructions (Signed)
Urinary Incontinence Urinary incontinence is the involuntary loss of urine from your bladder. What are the causes? There are many causes of urinary incontinence. They include:  Medicines.  Infections.  Prostatic enlargement, leading to overflow of urine from your bladder.  Surgery.  Neurological diseases.  Emotional factors.  What are the signs or symptoms? Urinary Incontinence can be divided into four types: 1. Urge incontinence. Urge incontinence is the involuntary loss of urine before you have the opportunity to go to the bathroom. There is a sudden urge to void but not enough time to reach a bathroom. 2. Stress incontinence. Stress incontinence is the sudden loss of urine with any activity that forces urine to pass. It is commonly caused by anatomical changes to the pelvis and sphincter areas of your body. 3. Overflow incontinence. Overflow incontinence is the loss of urine from an obstructed opening to your bladder. This results in a backup of urine and a resultant buildup of pressure within the bladder. When the pressure within the bladder exceeds the closing pressure of the sphincter, the urine overflows, which causes incontinence, similar to water overflowing a dam. 4. Total incontinence. Total incontinence is the loss of urine as a result of the inability to store urine within your bladder.  How is this diagnosed? Evaluating the cause of incontinence may require:  A thorough and complete medical and obstetric history.  A complete physical exam.  Laboratory tests such as a urine culture and sensitivities.  When additional tests are indicated, they can include:  An ultrasound exam.  Kidney and bladder X-rays.  Cystoscopy. This is an exam of the bladder using a narrow scope.  Urodynamic testing to test the nerve function to the bladder and sphincter areas.  How is this treated? Treatment for urinary incontinence depends on the cause:  For urge incontinence caused  by a bacterial infection, antibiotics will be prescribed. If the urge incontinence is related to medicines you take, your health care provider may have you change the medicine.  For stress incontinence, surgery to re-establish anatomical support to the bladder or sphincter, or both, will often correct the condition.  For overflow incontinence caused by an enlarged prostate, an operation to open the channel through the enlarged prostate will allow the flow of urine out of the bladder. In women with fibroids, a hysterectomy may be recommended.  For total incontinence, surgery on your urinary sphincter may help. An artificial urinary sphincter (an inflatable cuff placed around the urethra) may be required. In women who have developed a hole-like passage between their bladder and vagina (vesicovaginal fistula), surgery to close the fistula often is required.  Follow these instructions at home:  Normal daily hygiene and the use of pads or adult diapers that are changed regularly will help prevent odors and skin damage.  Avoid caffeine. It can overstimulate your bladder.  Use the bathroom regularly. Try about every 2-3 hours to go to the bathroom, even if you do not feel the need to do so. Take time to empty your bladder completely. After urinating, wait a minute. Then try to urinate again.  For causes involving nerve dysfunction, keep a log of the medicines you take and a journal of the times you go to the bathroom. Contact a health care provider if:  You experience worsening of pain instead of improvement in pain after your procedure.  Your incontinence becomes worse instead of better. Get help right away if:  You experience fever or shaking chills.  You are unable to   pass your urine.  You have redness spreading into your groin or down into your thighs. This information is not intended to replace advice given to you by your health care provider. Make sure you discuss any questions you have  with your health care provider. Document Released: 09/18/2004 Document Revised: 03/21/2016 Document Reviewed: 01/18/2013 Elsevier Interactive Patient Education  2018 Elsevier Inc[employer]  

## 2018-05-06 ENCOUNTER — Ambulatory Visit (INDEPENDENT_AMBULATORY_CARE_PROVIDER_SITE_OTHER): Payer: 59 | Admitting: *Deleted

## 2018-05-06 DIAGNOSIS — I441 Atrioventricular block, second degree: Secondary | ICD-10-CM | POA: Diagnosis not present

## 2018-05-07 NOTE — Progress Notes (Signed)
Remote pacemaker transmission.   

## 2018-05-10 MED FILL — ESZOPICLONE 2 MG TAB: 2 | 30 days supply | Qty: 30 | Fill #1

## 2018-05-12 MED FILL — ESOMEPRAZOLE MAG DR 40 MG C: 40 | 30 days supply | Qty: 30 | Fill #1

## 2018-05-26 LAB — CUP PACEART REMOTE DEVICE CHECK
Battery Remaining Longevity: 84 mo
Brady Statistic RA Percent Paced: 16 %
Brady Statistic RV Percent Paced: 100 %
Implantable Lead Implant Date: 20140626
Implantable Lead Location: 753859
Implantable Lead Location: 753860
Implantable Lead Model: 5076
Implantable Lead Model: 5076
Implantable Pulse Generator Implant Date: 20140626
Lead Channel Impedance Value: 409 Ohm
Lead Channel Pacing Threshold Amplitude: 0.6 V
Lead Channel Pacing Threshold Pulse Width: 0.5 ms
Lead Channel Setting Pacing Amplitude: 2 V
Lead Channel Setting Pacing Amplitude: 2.4 V
Lead Channel Setting Pacing Pulse Width: 0.4 ms
MDC IDC LEAD IMPLANT DT: 20140626
MDC IDC MSMT BATTERY REMAINING PERCENTAGE: 90 %
MDC IDC MSMT LEADCHNL RA IMPEDANCE VALUE: 398 Ohm
MDC IDC SESS DTM: 20190912171400
MDC IDC SET LEADCHNL RV SENSING SENSITIVITY: 8 mV
Pulse Gen Serial Number: 112476

## 2018-06-11 MED FILL — ESOMEPRAZOLE MAG DR 40 MG C: 40 | 30 days supply | Qty: 30 | Fill #2

## 2018-06-21 MED FILL — HYDROCODON-APAP 5-325: 5-325 | 20 days supply | Qty: 60 | Fill #0

## 2018-07-08 ENCOUNTER — Telehealth: Payer: 59 | Admitting: Physician Assistant

## 2018-07-08 DIAGNOSIS — R300 Dysuria: Secondary | ICD-10-CM | POA: Diagnosis not present

## 2018-07-08 MED ORDER — NITROFURANTOIN MONOHYD MACRO 100 MG PO CAPS: 100.0000 mg | ORAL_CAPSULE | Freq: Two times a day (BID) | ORAL | 0 refills | Status: DC

## 2018-07-08 NOTE — Progress Notes (Signed)

## 2018-07-12 MED FILL — ESOMEPRAZOLE MAG DR 40 MG C: 40 | 30 days supply | Qty: 30 | Fill #3

## 2018-07-13 DIAGNOSIS — R1013 Epigastric pain: Secondary | ICD-10-CM | POA: Diagnosis not present

## 2018-07-13 DIAGNOSIS — K219 Gastro-esophageal reflux disease without esophagitis: Secondary | ICD-10-CM | POA: Diagnosis not present

## 2018-07-13 DIAGNOSIS — K5730 Diverticulosis of large intestine without perforation or abscess without bleeding: Secondary | ICD-10-CM | POA: Diagnosis not present

## 2018-08-05 ENCOUNTER — Telehealth: Payer: Self-pay | Admitting: Cardiology

## 2018-08-05 ENCOUNTER — Ambulatory Visit (INDEPENDENT_AMBULATORY_CARE_PROVIDER_SITE_OTHER): Payer: 59

## 2018-08-05 DIAGNOSIS — R001 Bradycardia, unspecified: Secondary | ICD-10-CM

## 2018-08-05 DIAGNOSIS — I441 Atrioventricular block, second degree: Secondary | ICD-10-CM

## 2018-08-05 NOTE — Telephone Encounter (Signed)
LMOVM reminding pt to send remote transmission.   

## 2018-08-06 ENCOUNTER — Encounter: Payer: Self-pay | Admitting: Cardiology

## 2018-08-06 NOTE — Progress Notes (Signed)
Remote pacemaker transmission.   

## 2018-08-16 MED FILL — HYDROCODON-APAP 5-325: 5-325 | 20 days supply | Qty: 60 | Fill #0

## 2018-08-16 MED FILL — ESOMEPRAZOLE MAG DR 40 MG C: 40 | 30 days supply | Qty: 30 | Fill #4

## 2018-09-03 DIAGNOSIS — M4722 Other spondylosis with radiculopathy, cervical region: Secondary | ICD-10-CM | POA: Diagnosis not present

## 2018-09-03 DIAGNOSIS — R030 Elevated blood-pressure reading, without diagnosis of hypertension: Secondary | ICD-10-CM | POA: Diagnosis not present

## 2018-09-03 DIAGNOSIS — M4726 Other spondylosis with radiculopathy, lumbar region: Secondary | ICD-10-CM | POA: Diagnosis not present

## 2018-09-03 DIAGNOSIS — M5136 Other intervertebral disc degeneration, lumbar region: Secondary | ICD-10-CM | POA: Diagnosis not present

## 2018-09-03 DIAGNOSIS — M5416 Radiculopathy, lumbar region: Secondary | ICD-10-CM | POA: Diagnosis not present

## 2018-09-03 DIAGNOSIS — Z6826 Body mass index (BMI) 26.0-26.9, adult: Secondary | ICD-10-CM | POA: Diagnosis not present

## 2018-09-03 DIAGNOSIS — M5030 Other cervical disc degeneration, unspecified cervical region: Secondary | ICD-10-CM | POA: Diagnosis not present

## 2018-09-03 DIAGNOSIS — M5412 Radiculopathy, cervical region: Secondary | ICD-10-CM | POA: Diagnosis not present

## 2018-09-10 MED FILL — ESOMEPRAZOLE MAG DR 40 MG C: 40 | 30 days supply | Qty: 30 | Fill #5

## 2018-09-13 MED FILL — HYDROCODON-APAP 5-325: 5-325 | 20 days supply | Qty: 60 | Fill #0

## 2018-09-16 DIAGNOSIS — K219 Gastro-esophageal reflux disease without esophagitis: Secondary | ICD-10-CM | POA: Diagnosis not present

## 2018-09-16 DIAGNOSIS — K5730 Diverticulosis of large intestine without perforation or abscess without bleeding: Secondary | ICD-10-CM | POA: Diagnosis not present

## 2018-09-18 LAB — CUP PACEART REMOTE DEVICE CHECK
Battery Remaining Longevity: 84 mo
Battery Remaining Percentage: 92 %
Brady Statistic RA Percent Paced: 15 %
Date Time Interrogation Session: 20191212223800
Implantable Lead Implant Date: 20140626
Implantable Lead Location: 753859
Implantable Lead Model: 5076
Lead Channel Impedance Value: 408 Ohm
Lead Channel Impedance Value: 472 Ohm
Lead Channel Setting Pacing Amplitude: 2 V
Lead Channel Setting Sensing Sensitivity: 8 mV
MDC IDC LEAD IMPLANT DT: 20140626
MDC IDC LEAD LOCATION: 753860
MDC IDC MSMT LEADCHNL RA PACING THRESHOLD AMPLITUDE: 0.6 V
MDC IDC MSMT LEADCHNL RA PACING THRESHOLD PULSEWIDTH: 0.5 ms
MDC IDC PG IMPLANT DT: 20140626
MDC IDC PG SERIAL: 112476
MDC IDC SET LEADCHNL RV PACING AMPLITUDE: 2.4 V
MDC IDC SET LEADCHNL RV PACING PULSEWIDTH: 0.4 ms
MDC IDC STAT BRADY RV PERCENT PACED: 100 %

## 2018-09-27 ENCOUNTER — Ambulatory Visit (INDEPENDENT_AMBULATORY_CARE_PROVIDER_SITE_OTHER): Payer: Self-pay | Admitting: Nurse Practitioner

## 2018-09-27 VITALS — BP 135/75 | HR 67 | Temp 97.9°F | Resp 16 | Wt 169.0 lb

## 2018-09-27 DIAGNOSIS — H1013 Acute atopic conjunctivitis, bilateral: Secondary | ICD-10-CM

## 2018-09-27 MED ORDER — OLOPATADINE HCL 0.1 % OP SOLN: 1.0000 [drp] | Freq: Two times a day (BID) | OPHTHALMIC | 0 refills | Status: AC

## 2018-09-27 MED FILL — OLOPATADINE HCL 0.1% EYE DR: 0.1 | 25 days supply | Qty: 5 | Fill #0

## 2018-09-27 NOTE — Progress Notes (Signed)
Subjective:    Kari [name] is a 68 y.o. female who presents for evaluation of itching, tearing and irritation in both eyes. She has noticed the above symptoms for 2 weeks. Onset was associated with The patient informs she was redoing her bathroom and was exposed to a lot of sheet rock and dust. Patient denies trauma or injury, blurred vision, foreign body sensation, pain, photophobia and visual field deficit.  Patient further denies any upper respiratory symptoms, headache, loss of vision, or change in vision. Patient does not admit a history of seasonal allergies.  Patient has not taken any medication for her symptoms.  The following portions of the patient's history were reviewed and updated as appropriate: allergies, current medications and past medical history.  Review of Systems Constitutional: negative Eyes: positive for irritation, redness and tearing, negative for cataracts, color blindness, contacts/glasses, icterus and visual disturbance Ears, nose, mouth, throat, and face: negative Respiratory: negative Cardiovascular: negative   Objective:    BP 135/75 (BP Location: Right Arm, Patient Position: Sitting, Cuff Size: Normal)   Pulse 67   Temp 97.9 F (36.6 C) (Oral)   Resp 16   Wt 169 lb (76.7 kg)   SpO2 95%   BMI 25.70 kg/m       Physical Exam Vitals signs reviewed.  Constitutional:      General: She is not in acute distress.    Appearance: Normal appearance.  HENT:     Head: Normocephalic.     Right Ear: Tympanic membrane and ear canal normal.     Left Ear: Tympanic membrane and ear canal normal.     Nose: Nose normal.     Mouth/Throat:     Mouth: Mucous membranes are moist.  Eyes:     General: Lids are normal. Lids are everted, no foreign bodies appreciated. Vision grossly intact. Gaze aligned appropriately. No visual field deficit or scleral icterus.       Right eye: Discharge ( clear) present. No foreign body or hordeolum.        Left eye: Discharge  ( clear) present.No foreign body or hordeolum.     Extraocular Movements: Extraocular movements intact.     Conjunctiva/sclera:     Right eye: Right conjunctiva is not injected. No exudate or hemorrhage.    Left eye: Left conjunctiva is not injected. No exudate or hemorrhage.    Pupils: Pupils are equal, round, and reactive to light.     Comments: + redness and tearing bilaterally, no purulent drainage in the eyes. No photophobia, + chemosis bilaterally, no eyelid edema present at this time  Neurological:     Mental Status: She is alert.    Assessment:    Allergic conjunctivitis   Plan:   Exam findings, diagnosis etiology and medication use and indications reviewed with patient. Follow- Up and discharge instructions provided. No emergent/urgent issues found on exam.  Based on the patient's clinical presentation, symptoms, duration of symptoms and physical exam, patient's findings are congruent with that an allergic conjunctivitis.  Patient does not demonstrate any signs of bacterial conjunctivitis to include mucopurulent drainage, or conjunctival hyperemia.  There is no concern for loss of vision due to the patient's report of no change in vision.  Will provide the patient with Patanol eyedrops to help with her allergic symptoms.  We will also advised patient to use an antihistamine when working outside or when doing any remodeling activities.  Patient education was provided. Patient verbalized understanding of information provided and agrees with plan  of care (POC), all questions answered. The patient is advised to call or return to clinic if condition does not see an improvement in symptoms, or to seek the care of the closest emergency department if condition worsens with the above plan.   1. Allergic conjunctivitis of both eyes  - olopatadine (PATANOL) 0.1 % ophthalmic solution; Place 1 drop into both eyes 2 (two) times daily for 10 days.  Dispense: 1 mL; Refill: 0 -Use medication as  prescribed. -May also take an antihistamine such as Benadryl or Zyrtec.  Benadryl will make you drowsy. -Cool compresses to the eyes to help with irritation. -Avoid allergens if possible. -Follow up as needed.

## 2018-09-27 NOTE — Patient Instructions (Signed)
Allergic Conjunctivitis, Adult  -Use medication as prescribed. -May also take an antihistamine such as Benadryl or Zyrtec.  Benadryl will make you drowsy. -Cool compresses to the eyes to help with irritation. -Avoid allergens if possible. -Follow up as needed.   Allergic conjunctivitis is inflammation of the clear membrane that covers the white part of your eye and the inner surface of your eyelid (conjunctiva). The inflammation is caused by allergies. The blood vessels in the conjunctiva become inflamed and this causes the eyes to become red or pink. The eyes often feel itchy. Allergic conjunctivitis cannot be spread from one person to another person (is not contagious). What are the causes? This condition is caused by an allergic reaction. Common causes of an allergic reaction (allergens) include:  Outdoor allergens, such as: ? Pollen. ? Grass and weeds. ? Mold spores.  Indoor allergens, such as: ? Dust. ? Smoke. ? Mold. ? Pet dander. ? Animal hair. What increases the risk? You may be more likely to develop this condition if you have a family history of allergies, such as:  Allergic rhinitis.  Bronchial asthma.  Atopic dermatitis. What are the signs or symptoms? Symptoms of this condition include eyes that are:  Itchy.  Red.  Watery.  Puffy. Your eyes may also:  Sting or burn.  Have clear drainage coming from them. How is this diagnosed? This condition may be diagnosed by medical history and physical exam. If you have drainage from your eyes, it may be tested to rule out other causes of conjunctivitis. You may also need to see a health care provider who specializes in treating allergies (allergist) or eye conditions (ophthalmologist) for tests to confirm the diagnosis. You may have:  Skin tests to see which allergens are causing your symptoms. These tests involve pricking the skin with a tiny needle and exposing the skin to small amounts of potential allergens to  see if your skin reacts.  Blood tests.  Tissue scrapings from your eyelid. These will be examined under a microscope. How is this treated? Treatments for this condition may include:  Cold cloths (compresses) to soothe itching and swelling.  Washing the face to remove allergens.  Eye drops. These may be prescription or over-the-counter. There are several different types. You may need to try different types to see which one works best for you. Your may need: ? Eye drops that block the allergic reaction (antihistamine). ? Eye drops that reduce swelling and irritation (anti-inflammatory). ? Steroid eye drops to lessen a severe reaction (vernal conjunctivitis).  Oral antihistamine medicines to reduce your allergic reaction. You may need these if eye drops do not help or are difficult to use. Follow these instructions at home:  Avoid known allergens whenever possible.  Take or apply over-the-counter and prescription medicines only as told by your health care provider. These include any eye drops.  Apply a cool, clean washcloth to your eye for 10-20 minutes, 3-4 times a day.  Do not touch or rub your eyes.  Do not wear contact lenses until the inflammation is gone. Wear glasses instead.  Do not wear eye makeup until the inflammation is gone.  Keep all follow-up visits as told by your health care provider. This is important. Contact a health care provider if:  Your symptoms get worse or do not improve with treatment.  You have mild eye pain.  You have sensitivity to light.  You have spots or blisters on your eyes.  You have pus draining from your eye.  You have a fever. Get help right away if:  You have redness, swelling, or other symptoms in only one eye.  Your vision is blurred or you have vision changes.  You have severe eye pain. This information is not intended to replace advice given to you by your health care provider. Make sure you discuss any questions you have  with your health care provider. Document Released: 11/01/2002 Document Revised: 04/09/2016 Document Reviewed: 02/22/2016 Elsevier Interactive Patient Education  2019 [employer].

## 2018-09-29 ENCOUNTER — Telehealth: Payer: Self-pay

## 2018-09-29 NOTE — Telephone Encounter (Signed)
Patient did not answered the phone. I left a message asking to call us back.  

## 2018-10-12 MED FILL — HYDROCODON-APAP 5-325: 5-325 | 20 days supply | Qty: 60 | Fill #0

## 2018-10-21 MED FILL — ESOMEPRAZOLE MAG DR 40 MG C: 40 | 30 days supply | Qty: 30 | Fill #0 | Status: TO

## 2018-11-04 ENCOUNTER — Ambulatory Visit (INDEPENDENT_AMBULATORY_CARE_PROVIDER_SITE_OTHER): Payer: 59 | Admitting: *Deleted

## 2018-11-04 DIAGNOSIS — I441 Atrioventricular block, second degree: Secondary | ICD-10-CM | POA: Diagnosis not present

## 2018-11-05 MED FILL — HYDROCODON-APAP 5-325: 5-325 | 20 days supply | Qty: 60 | Fill #0

## 2018-11-07 LAB — CUP PACEART REMOTE DEVICE CHECK
Battery Remaining Longevity: 78 mo
Battery Remaining Percentage: 86 %
Brady Statistic RV Percent Paced: 100 %
Date Time Interrogation Session: 20200313022400
Implantable Lead Implant Date: 20140626
Implantable Lead Model: 5076
Implantable Pulse Generator Implant Date: 20140626
Lead Channel Impedance Value: 429 Ohm
Lead Channel Pacing Threshold Pulse Width: 0.5 ms
Lead Channel Setting Pacing Amplitude: 2.4 V
Lead Channel Setting Pacing Pulse Width: 0.4 ms
Lead Channel Setting Sensing Sensitivity: 8 mV
MDC IDC LEAD IMPLANT DT: 20140626
MDC IDC LEAD LOCATION: 753859
MDC IDC LEAD LOCATION: 753860
MDC IDC MSMT LEADCHNL RA IMPEDANCE VALUE: 408 Ohm
MDC IDC MSMT LEADCHNL RA PACING THRESHOLD AMPLITUDE: 0.6 V
MDC IDC PG SERIAL: 112476
MDC IDC SET LEADCHNL RA PACING AMPLITUDE: 2 V
MDC IDC STAT BRADY RA PERCENT PACED: 16 %

## 2018-11-11 ENCOUNTER — Encounter: Payer: Self-pay | Admitting: Cardiology

## 2018-11-11 NOTE — Progress Notes (Signed)
Remote pacemaker transmission.   

## 2018-11-17 MED FILL — valACYclovir HCL 1 GM TABS: 1 | 90 days supply | Qty: 90 | Fill #0

## 2018-11-22 MED FILL — ESOMEPRAZOLE MAG DR 40 MG C: 40 | 30 days supply | Qty: 30 | Fill #0

## 2018-12-21 MED FILL — ESOMEPRAZOLE MAG DR 40 MG C: 40 | 30 days supply | Qty: 30 | Fill #1

## 2019-01-05 ENCOUNTER — Encounter: Payer: 59 | Admitting: Family Medicine

## 2019-01-06 MED FILL — ESOMEPRAZOLE MAG DR 40 MG C: 40 | 30 days supply | Qty: 30 | Fill #1

## 2019-02-03 ENCOUNTER — Ambulatory Visit (INDEPENDENT_AMBULATORY_CARE_PROVIDER_SITE_OTHER): Payer: 59 | Admitting: *Deleted

## 2019-02-03 DIAGNOSIS — I441 Atrioventricular block, second degree: Secondary | ICD-10-CM

## 2019-02-03 DIAGNOSIS — I442 Atrioventricular block, complete: Secondary | ICD-10-CM

## 2019-02-04 ENCOUNTER — Telehealth: Payer: Self-pay

## 2019-02-04 NOTE — Telephone Encounter (Signed)
Left message for patient to remind of missed remote transmission.  

## 2019-02-06 LAB — CUP PACEART REMOTE DEVICE CHECK
Battery Remaining Longevity: 78 mo
Battery Remaining Percentage: 83 %
Brady Statistic RA Percent Paced: 17 %
Brady Statistic RV Percent Paced: 100 %
Date Time Interrogation Session: 20200612192100
Implantable Lead Implant Date: 20140626
Implantable Lead Implant Date: 20140626
Implantable Lead Location: 753859
Implantable Lead Location: 753860
Implantable Lead Model: 5076
Implantable Lead Model: 5076
Implantable Pulse Generator Implant Date: 20140626
Lead Channel Impedance Value: 425 Ohm
Lead Channel Impedance Value: 465 Ohm
Lead Channel Pacing Threshold Amplitude: 0.6 V
Lead Channel Pacing Threshold Pulse Width: 0.5 ms
Lead Channel Setting Pacing Amplitude: 2 V
Lead Channel Setting Pacing Amplitude: 2.4 V
Lead Channel Setting Pacing Pulse Width: 0.4 ms
Lead Channel Setting Sensing Sensitivity: 8 mV
Pulse Gen Serial Number: 112476

## 2019-02-09 ENCOUNTER — Encounter: Payer: Self-pay | Admitting: Family Medicine

## 2019-02-09 ENCOUNTER — Encounter: Payer: Self-pay | Admitting: Cardiology

## 2019-02-09 MED FILL — ESOMEPRAZOLE MAG DR 40 MG C: 40 | 30 days supply | Qty: 30 | Fill #0

## 2019-02-09 NOTE — Progress Notes (Signed)
Remote pacemaker transmission.   

## 2019-02-10 ENCOUNTER — Telehealth: Payer: 59 | Admitting: Physician Assistant

## 2019-02-10 ENCOUNTER — Encounter: Payer: Self-pay | Admitting: Family Medicine

## 2019-02-10 DIAGNOSIS — R300 Dysuria: Secondary | ICD-10-CM

## 2019-02-10 MED ORDER — AMOXICILLIN-POT CLAVULANATE 500-125 MG PO TABS: 1.0000 | ORAL_TABLET | Freq: Two times a day (BID) | ORAL | 0 refills | Status: DC

## 2019-02-10 NOTE — Progress Notes (Signed)
We are sorry that you are not feeling well.  Here is how we plan to help!  Based on what you shared with me it looks like you most likely have a simple urinary tract infection.  A UTI (Urinary Tract Infection) is a bacterial infection of the bladder.  Most cases of urinary tract infections are simple to treat but a key part of your care is to encourage you to drink plenty of fluids and watch your symptoms carefully.  I have prescribed Augmentin as I see you can tolerate Amoxicillin. I have sent the medication to your pharmacy to take as directed. Your symptoms should gradually improve. Call us if the burning in your urine worsens, you develop worsening fever, back pain or pelvic pain or if your symptoms do not resolve after completing the antibiotic.  Stopping the Bactrim should calm things down. Continue with Benadryl every 4-6 hours for the next 24 hours. If not resolving, you need to follow-up with Dr. Nani Ravens or a colleague.   Urinary tract infections can be prevented by drinking plenty of water to keep your body hydrated.  Also be sure when you wipe, wipe from front to back and don't hold it in!  If possible, empty your bladder every 4 hours.  Your e-visit answers were reviewed by a board certified advanced clinical practitioner to complete your personal care plan.  Depending on the condition, your plan could have included both over the counter or prescription medications.  If there is a problem please reply  once you have received a response from your provider.  Your safety is important to [place].  If you have drug allergies check your prescription carefully.    You can use MyChart to ask questions about today's visit, request a non-urgent call back, or ask for a work or school excuse for 24 hours related to this e-Visit. If it has been greater than 24 hours you will need to follow up with your provider, or enter a new e-Visit to address those concerns.   You will get an e-mail in the next  two days asking about your experience.  I hope that your e-visit has been valuable and will speed your recovery. Thank you for using e-visits.

## 2019-02-13 ENCOUNTER — Telehealth: Payer: 59 | Admitting: Physician Assistant

## 2019-02-13 DIAGNOSIS — B373 Candidiasis of vulva and vagina: Secondary | ICD-10-CM | POA: Diagnosis not present

## 2019-02-13 DIAGNOSIS — B3731 Acute candidiasis of vulva and vagina: Secondary | ICD-10-CM

## 2019-02-13 NOTE — Progress Notes (Signed)
I have spent 5 minutes in review of e-visit questionnaire, review and updating patient chart, medical decision making and response to patient.   Harutyun Monteverde Cody Zipporah Finamore, PA-C    

## 2019-02-13 NOTE — Progress Notes (Signed)

## 2019-02-25 ENCOUNTER — Telehealth: Payer: 59 | Admitting: Nurse Practitioner

## 2019-02-25 DIAGNOSIS — N3000 Acute cystitis without hematuria: Secondary | ICD-10-CM

## 2019-02-25 NOTE — Progress Notes (Signed)
Based on what you shared with me it looks like you have recurrent/ unresolved UTI,that should be evaluated in a face to face office visit. You will need a urinalysis and a urine culture to treat this properly.   NOTE: If you entered your credit card information for this eVisit, you will not be charged. You may see a "hold" on your card for the $30 but that hold will drop off and you will not have a charge processed.  If you are having a true medical emergency please call 911.  If you need an urgent face to face visit, [place] has four urgent care centers for your convenience.  If you need care fast and have a high deductible or no insurance consider:   [email or web address] to reserve your spot online an avoid wait times  [facility] [address], [address] [place], [place] [address] 8 am to 8 pm Monday-Friday 10 am to 4 pm Saturday-Sunday *Across the street from [employer]  [place], [address] 8 am to 5 pm Monday-Friday * In the [facility] on the [facility]   The following sites will take your  insurance:  . [facility] [place] a Provider at this Location  [address] [place], [place] [address] . 10 am to 8 pm Monday-Friday . 12 pm to 8 pm Saturday-Sunday   . [facility] [place] a Provider at this Location  [address] [place], [place] [address] . 8 am to 8 pm Monday-Friday . 9 am to 6 pm Saturday . 11 am to 6 pm Sunday   . [facility] [place] Get Driving Directions  [address] [place],  [address] . 8 am to 8 pm Monday-Friday . 8 am to 4 pm Saturday-Sunday   Your e-visit answers were reviewed by a board certified advanced clinical practitioner to complete your personal care plan.

## 2019-02-26 ENCOUNTER — Ambulatory Visit (HOSPITAL_COMMUNITY)
Admission: EM | Admit: 2019-02-26 | Discharge: 2019-02-26 | Disposition: A | Discharge status: Home / Self Care | Payer: 59 | Attending: Family Medicine | Admitting: Family Medicine

## 2019-02-26 ENCOUNTER — Other Ambulatory Visit: Payer: Self-pay

## 2019-02-26 ENCOUNTER — Encounter (HOSPITAL_COMMUNITY): Payer: Self-pay | Admitting: Emergency Medicine

## 2019-02-26 DIAGNOSIS — N390 Urinary tract infection, site not specified: Secondary | ICD-10-CM | POA: Diagnosis not present

## 2019-02-26 LAB — POCT URINALYSIS DIP (DEVICE)
Bilirubin Urine: NEGATIVE
Glucose, UA: NEGATIVE mg/dL
Hgb urine dipstick: NEGATIVE
Ketones, ur: NEGATIVE mg/dL
Nitrite: NEGATIVE
Protein, ur: NEGATIVE mg/dL
Specific Gravity, Urine: 1.015 (ref 1.005–1.030)
Urobilinogen, UA: 0.2 mg/dL (ref 0.0–1.0)
pH: 7 (ref 5.0–8.0)

## 2019-02-26 MED ORDER — CIPROFLOXACIN HCL 500 MG PO TABS: 500.0000 mg | ORAL_TABLET | Freq: Two times a day (BID) | ORAL | 0 refills | Status: DC

## 2019-02-26 MED ORDER — FLUCONAZOLE 200 MG PO TABS: 200.0000 mg | ORAL_TABLET | Freq: Once | ORAL | 0 refills | Status: AC

## 2019-02-26 NOTE — Discharge Instructions (Signed)
Take antibiotic as directed. May take diflucan if needed thereafter. Return if you develop fever, abdominal pain, back pain, blood in urine.

## 2019-02-26 NOTE — ED Provider Notes (Signed)
[place]    CSN: [id number] Arrival date & time: 02/26/19  1004     History   Chief Complaint Chief Complaint  Patient presents with  . Urinary Tract Infection    HPI Kari [name] is a 68 y.o. female   Urinary Tract Infection: Patient complains of frequency, suprapubic pressure and urgency She has had symptoms for 2 day. Patient denies back pain, congestion, cough, fever, headache, sorethroat and vaginal discharge. Patient does not have a history of recurrent UTI.  Patient does not have a history of pyelonephritis. Of note pt was seen on 6/18 for similar symptoms via E-visit, given Augmentin x 10 days which resolved symptoms. Patient endorses h/o yeast infections s/p antibiotic use.  Past Medical History:  Diagnosis Date  . Arthritis   . Chronic back pain   . Diverticulitis   . Herpes zoster conjunctivitis   . Menopause   . Mobitz type 2 second degree AV block    2:1/notes 02/16/2013  . PONV (postoperative nausea and vomiting)   . Presence of permanent cardiac pacemaker   . Seasonal allergies    "spring & fall; not q year" (02/17/2013)  . Shoulder impingement    and left rotator cuff tear    Patient Active Problem List   Diagnosis Date Noted  . Near syncope 03/11/2013  . Bradycardia 02/21/2013  . S/P cardiac pacemaker procedure 02/20/2013  . Chest tightness 02/19/2013  . Palpitations 02/19/2013  . 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  . Diverticulitis 03/03/2012  . Chronic low back pain 08/28/2011  . GERD (gastroesophageal reflux disease)   . Herpes zoster conjunctivitis   . Menopause   . Arthritis   . Diverticulosis   . Allergy     Past Surgical History:  Procedure Laterality Date  . ACHILLES TENDON SURGERY    . ARTHROGRAM KNEE Right 1975  . CHOLECYSTECTOMY  1990's  . colonscopy    . ESOPHAGOGASTRODUODENOSCOPY    . EYE SURGERY     muscle release right eye  . INSERT / REPLACE / REMOVE PACEMAKER  02/17/2013   [employer] Advantio dual-chamber pacemaker, model [id number],  . KNEE ARTHROSCOPY Right 1982, 1983, 1995, 1996  . KNEE ARTHROSCOPY W/ ACL RECONSTRUCTION Left 1984  . LAPAROSCOPIC SIGMOID COLECTOMY N/A 01/01/2015   Procedure: LAPAROSCOPIC SIGMOID COLECTOMY;  Surgeon: Stark Klein, MD;  Location: [facility];  Service: General;  Laterality: N/A;  . PERMANENT PACEMAKER INSERTION N/A 02/17/2013   Procedure: PERMANENT PACEMAKER INSERTION;  Surgeon: Evans Lance, MD;  Location: [facility] C[facility] LAB;  Service: Cardiovascular;  Laterality: N/A;  . SHOULDER ARTHROSCOPY Left 09/05/2016   Procedure: LEFT SHOULDER ARTHROSCOPY , ACROMIOPLASTY AND ROTATOR CUFF REPAIR;  Surgeon: Melrose Nakayama, MD;  Location: [facility];  Service: Orthopedics;  Laterality: Left;  . TONSILLECTOMY  1961  . TOTAL KNEE ARTHROPLASTY Right 2000; 06/2000   "replaced w/appropriate hardware" (02/17/2013)  . TRIGGER FINGER RELEASE Right ~ 2012   "thumb" (02/17/2013)  . VAGINAL HYSTERECTOMY  ~ 1994   partial     OB History    Gravida  1   Para  1   Term      Preterm      AB      Living        SAB      TAB      Ectopic      Multiple      Live Births  Home Medications    Prior to Admission medications   Medication Sig Start Date End Date Taking? Authorizing Provider  amoxicillin-clavulanate (AUGMENTIN) 500-125 MG tablet Take 1 tablet (500 mg total) by mouth 2 (two) times a day. 02/10/19   Brunetta Jeans, PA-C  aspirin EC 81 MG tablet Take 81 mg by mouth daily.    [provider]  Cholecalciferol (VITAMIN D3) 5000 units CAPS Take 5,000 Units by mouth daily.    [provider]  ciprofloxacin (CIPRO) 500 MG tablet Take 1 tablet (500 mg total) by mouth every 12 (twelve) hours. 02/26/19   Hall-Potvin, [place], PA-C  Cyanocobalamin (VITAMIN B-12) 2500 MCG SUBL Place 5,000 mcg under the tongue daily.     [provider]  esomeprazole (NEXIUM) 40 MG capsule Take 40 mg by mouth daily.  01/28/18    [provider]  fluconazole (DIFLUCAN) 200 MG tablet Take 1 tablet (200 mg total) by mouth once for 1 dose. May repeat in 72 hours if needed 02/26/19 02/26/19  Hall-Potvin, [place], PA-C  fluticasone (FLONASE) 50 MCG/ACT nasal spray Place 1 spray into both nostrils daily.     [provider]  HYDROcodone-acetaminophen (NORCO/VICODIN) 5-325 MG tablet Take 1-2 tablets by mouth every 4 (four) hours as needed for moderate pain. 09/05/16   Loni Dolly, PA-C  ibuprofen (ADVIL,MOTRIN) 400 MG tablet Take 400 mg by mouth every 6 (six) hours as needed.    [provider]  Melatonin 10 MG TABS Take 20-30 mg by mouth 3 (three) times a week. As needed for sleep    [provider]  Multiple Vitamin (MULTIVITAMIN WITH MINERALS) TABS Take 1 tablet by mouth daily.    [provider]  Omega-3 Fatty Acids (FISH OIL ULTRA) 1400 MG CAPS Take 1,400 mg by mouth daily.    [provider]  Probiotic Product (PROBIOTIC FORMULA PO) Take 1 capsule by mouth daily.     [provider]  valACYclovir (VALTREX) 1000 MG tablet Take 1,000 mg by mouth daily as needed (herpes zoster).     [provider]    Family History Family History  Problem Relation Age of Onset  . Hypertension Mother   . Hyperlipidemia Mother   . Macular degeneration Mother   . COPD Mother   . Lung cancer Father   . Cancer Father        Lung  . COPD Father   . Diabetes Brother   . Breast cancer Paternal Aunt     Social History Social History   Tobacco Use  . Smoking status: Never Smoker  . Smokeless tobacco: Never Used  Substance Use Topics  . Alcohol use: Yes    Comment: occasional wine  . Drug use: No     Allergies   Ceftin, Vancomycin, and Levofloxacin   Review of Systems As per HPI   Physical Exam Triage Vital Signs ED Triage Vitals  Enc Vitals Group     BP      Pulse      Resp      Temp      Temp src      SpO2      Weight      Height      Head  Circumference      Peak Flow      Pain Score      Pain Loc      Pain Edu?      Excl. in [place]?    No data found.  Updated Vital Signs BP (!) 146/79 (BP Location: Right Arm)   Pulse (!) 58   Resp 16   SpO2 99%   Visual Acuity Right Eye Distance:   Left Eye Distance:   Bilateral Distance:    Right Eye Near:   Left Eye Near:    Bilateral Near:     Physical Exam Constitutional:      General: She is not in acute distress. HENT:     Head: Normocephalic and atraumatic.  Eyes:     General: No scleral icterus.    Pupils: Pupils are equal, round, and reactive to light.  Cardiovascular:     Rate and Rhythm: Normal rate.  Pulmonary:     Effort: Pulmonary effort is normal.  Skin:    Coloration: Skin is not jaundiced or pale.  Neurological:     Mental Status: She is alert and oriented to person, place, and time.      UC Treatments / Results  Labs (all labs ordered are listed, but only abnormal results are displayed) Labs Reviewed  POCT URINALYSIS DIP (DEVICE) - Abnormal; Notable for the following components:      Result Value   Leukocytes,Ua LARGE (*)    All other components within normal limits  URINE CULTURE    EKG   Radiology No results found.  Procedures Procedures (including critical care time)  Medications Ordered in UC Medications - No data to display  Initial Impression / Assessment and Plan / UC Course  I have reviewed the triage vital signs and the nursing notes.  Pertinent labs & imaging results that were available during my care of the patient were reviewed by me and considered in my medical decision making (see chart for details).     68 year old female presenting for acute concern of UTI.  Patient is status post 10-day course of Augmentin, recurrence of symptoms x 2 days.  POCT urinalysis showing large leukocytes, culture sent.  Patient is stable in office, does not appear toxic.  Will treat with ciprofloxacin as she is tolerated this well in the  past.  Will send for Diflucan as well as she gets yeast infections status post antibiotic use.  Precautions discussed, patient verbalized understanding and is agreeable to plan. Final Clinical Impressions(s) / UC Diagnoses   Final diagnoses:  Lower urinary tract infectious disease     Discharge Instructions     Take antibiotic as directed. May take diflucan if needed thereafter. Return if you develop fever, abdominal pain, back pain, blood in urine.    ED Prescriptions    Medication Sig Dispense Auth. Provider   ciprofloxacin (CIPRO) 500 MG tablet Take 1 tablet (500 mg total) by mouth every 12 (twelve) hours. 10 tablet Hall-Potvin, [place], PA-C   fluconazole (DIFLUCAN) 200 MG tablet Take 1 tablet (200 mg total) by mouth once for 1 dose. May repeat in 72 hours if needed 2 tablet Hall-Potvin, [place], PA-C     Controlled Substance Prescriptions [place] Controlled Substance Registry consulted? Not Applicable   Kari [name], Kari 02/26/19 1115

## 2019-02-26 NOTE — ED Triage Notes (Signed)
Per pt she was treated for uti and was given antibiotic with reaction and then [laced on Augmentin. Pt said she feels it did not clear it up bc symptoms are back. Hurting with urination. No blood in urine. Some lower abdominal pain.

## 2019-02-28 ENCOUNTER — Telehealth (HOSPITAL_COMMUNITY): Payer: Self-pay | Admitting: Emergency Medicine

## 2019-02-28 LAB — URINE CULTURE: Culture: >=100000 — AB

## 2019-02-28 NOTE — Telephone Encounter (Signed)
Patient contacted and made aware of all results, all questions answered.   

## 2019-02-28 NOTE — Telephone Encounter (Signed)
Treated with Cipro, Attempted to reach patient. No answer at this time

## 2019-03-04 ENCOUNTER — Encounter (HOSPITAL_COMMUNITY): Payer: Self-pay

## 2019-03-04 ENCOUNTER — Ambulatory Visit (HOSPITAL_COMMUNITY)
Admission: EM | Admit: 2019-03-04 | Discharge: 2019-03-04 | Disposition: A | Discharge status: Home / Self Care | Payer: 59 | Attending: Internal Medicine | Admitting: Internal Medicine

## 2019-03-04 DIAGNOSIS — N3001 Acute cystitis with hematuria: Secondary | ICD-10-CM | POA: Diagnosis not present

## 2019-03-04 LAB — POCT URINALYSIS DIP (DEVICE)
Bilirubin Urine: NEGATIVE
Glucose, UA: NEGATIVE mg/dL
Ketones, ur: NEGATIVE mg/dL
Nitrite: NEGATIVE
Protein, ur: NEGATIVE mg/dL
Specific Gravity, Urine: 1.01 (ref 1.005–1.030)
Urobilinogen, UA: 0.2 mg/dL (ref 0.0–1.0)
pH: 7 (ref 5.0–8.0)

## 2019-03-04 MED ORDER — CIPROFLOXACIN HCL 500 MG PO TABS: 500.0000 mg | ORAL_TABLET | Freq: Two times a day (BID) | ORAL | 0 refills | Status: AC

## 2019-03-04 MED ORDER — PHENAZOPYRIDINE HCL 200 MG PO TABS: 200.0000 mg | ORAL_TABLET | Freq: Three times a day (TID) | ORAL | 0 refills | Status: DC

## 2019-03-04 NOTE — ED Provider Notes (Signed)
Kari [name]    CSN: [id number] Arrival date & time: 03/04/19  1316     History   Chief Complaint Chief Complaint  Patient presents with  . Urinary Frequency    HPI Kari [name] is a 68 y.o. female recently seen in urgent care for urinary tract infection with urine cultures growing E. coli sensitive to quinolone comes to urgent care for worsening dysuria, urgency and frequency since she completed 5-day course of ciprofloxacin.  Patient continues to have lower abdominal pain and tenderness.  No flank pain.  No fever or chills.  No nausea or vomiting.  No flank pain she denies any relieving factors.Marland Kitchen   HPI  Past Medical History:  Diagnosis Date  . Arthritis   . Chronic back pain   . Diverticulitis   . Herpes zoster conjunctivitis   . Menopause   . Mobitz type 2 second degree AV block    2:1/notes 02/16/2013  . PONV (postoperative nausea and vomiting)   . Presence of permanent cardiac pacemaker   . Seasonal allergies    "spring & fall; not q year" (02/17/2013)  . Shoulder impingement    and left rotator cuff tear    Patient Active Problem List   Diagnosis Date Noted  . Near syncope 03/11/2013  . Bradycardia 02/21/2013  . S/P cardiac pacemaker procedure 02/20/2013  . Chest tightness 02/19/2013  . Palpitations 02/19/2013  . 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  . Diverticulitis 03/03/2012  . Chronic low back pain 08/28/2011  . GERD (gastroesophageal reflux disease)   . Herpes zoster conjunctivitis   . Menopause   . Arthritis   . Diverticulosis   . Allergy     Past Surgical History:  Procedure Laterality Date  . ACHILLES TENDON SURGERY    . ARTHROGRAM KNEE Right 1975  . CHOLECYSTECTOMY  1990's  . colonscopy    . ESOPHAGOGASTRODUODENOSCOPY    . EYE SURGERY     muscle release right eye  . INSERT / REPLACE / REMOVE PACEMAKER  02/17/2013   [employer] Advantio dual-chamber pacemaker, model [id number],  . KNEE ARTHROSCOPY Right  1982, 1983, 1995, 1996  . KNEE ARTHROSCOPY W/ ACL RECONSTRUCTION Left 1984  . LAPAROSCOPIC SIGMOID COLECTOMY N/A 01/01/2015   Procedure: LAPAROSCOPIC SIGMOID COLECTOMY;  Surgeon: Stark Klein, MD;  Location: [facility];  Service: General;  Laterality: N/A;  . PERMANENT PACEMAKER INSERTION N/A 02/17/2013   Procedure: PERMANENT PACEMAKER INSERTION;  Surgeon: Evans Lance, MD;  Location: [facility] CATH LAB;  Service: Cardiovascular;  Laterality: N/A;  . SHOULDER ARTHROSCOPY Left 09/05/2016   Procedure: LEFT SHOULDER ARTHROSCOPY , ACROMIOPLASTY AND ROTATOR CUFF REPAIR;  Surgeon: Melrose Nakayama, MD;  Location: [facility];  Service: Orthopedics;  Laterality: Left;  . TONSILLECTOMY  1961  . TOTAL KNEE ARTHROPLASTY Right 2000; 06/2000   "replaced w/appropriate hardware" (02/17/2013)  . TRIGGER FINGER RELEASE Right ~ 2012   "thumb" (02/17/2013)  . VAGINAL HYSTERECTOMY  ~ 1994   partial     OB History    Gravida  1   Para  1   Term      Preterm      AB      Living        SAB      TAB      Ectopic      Multiple      Live Births               Home Medications    Prior  to Admission medications   Medication Sig Start Date End Date Taking? Authorizing Provider  aspirin EC 81 MG tablet Take 81 mg by mouth daily.    [provider]  Cholecalciferol (VITAMIN D3) 5000 units CAPS Take 5,000 Units by mouth daily.    [provider]  ciprofloxacin (CIPRO) 500 MG tablet Take 1 tablet (500 mg total) by mouth every 12 (twelve) hours for 7 days. 03/04/19 03/11/19  LampteyMyrene Galas, MD  Cyanocobalamin (VITAMIN B-12) 2500 MCG SUBL Place 5,000 mcg under the tongue daily.     [provider]  esomeprazole (NEXIUM) 40 MG capsule Take 40 mg by mouth daily.  01/28/18   [provider]  fluticasone (FLONASE) 50 MCG/ACT nasal spray Place 1 spray into both nostrils daily.     [provider]  HYDROcodone-acetaminophen (NORCO/VICODIN) 5-325 MG tablet Take 1-2 tablets by mouth  every 4 (four) hours as needed for moderate pain. 09/05/16   Loni Dolly, PA-C  ibuprofen (ADVIL,MOTRIN) 400 MG tablet Take 400 mg by mouth every 6 (six) hours as needed.    [provider]  Melatonin 10 MG TABS Take 20-30 mg by mouth 3 (three) times a week. As needed for sleep    [provider]  Multiple Vitamin (MULTIVITAMIN WITH MINERALS) TABS Take 1 tablet by mouth daily.    [provider]  Omega-3 Fatty Acids (FISH OIL ULTRA) 1400 MG CAPS Take 1,400 mg by mouth daily.    [provider]  phenazopyridine (PYRIDIUM) 200 MG tablet Take 1 tablet (200 mg total) by mouth 3 (three) times daily. 03/04/19   LampteyMyrene Galas, MD  Probiotic Product (PROBIOTIC FORMULA PO) Take 1 capsule by mouth daily.     [provider]  valACYclovir (VALTREX) 1000 MG tablet Take 1,000 mg by mouth daily as needed (herpes zoster).     [provider]    Family History Family History  Problem Relation Age of Onset  . Hypertension Mother   . Hyperlipidemia Mother   . Macular degeneration Mother   . COPD Mother   . Lung cancer Father   . Cancer Father        Lung  . COPD Father   . Diabetes Brother   . Breast cancer Paternal Aunt     Social History Social History   Tobacco Use  . Smoking status: Never Smoker  . Smokeless tobacco: Never Used  Substance Use Topics  . Alcohol use: Yes    Comment: occasional wine  . Drug use: No     Allergies   Ceftin, Vancomycin, Bactrim [sulfamethoxazole-trimethoprim], and Levofloxacin   Review of Systems Review of Systems  Constitutional: Positive for activity change. Negative for appetite change, chills and fever.  HENT: Negative.   Respiratory: Negative.   Cardiovascular: Negative.   Gastrointestinal: Positive for abdominal pain. Negative for constipation, diarrhea, nausea, rectal pain and vomiting.  Genitourinary: Positive for dysuria, frequency, pelvic pain and urgency. Negative for vaginal discharge  and vaginal pain.  Musculoskeletal: Negative.   Neurological: Negative for dizziness, weakness and light-headedness.  Psychiatric/Behavioral: Negative.      Physical Exam Triage Vital Signs ED Triage Vitals  Enc Vitals Group     BP 03/04/19 1344 140/68     Pulse Rate 03/04/19 1344 62     Resp 03/04/19 1344 18     Temp 03/04/19 1344 98.3 F (36.8 C)     Temp Source 03/04/19 1344 Oral     SpO2 03/04/19 1344 97 %  Weight --      Height --      Head Circumference --      Peak Flow --      Pain Score 03/04/19 1345 3     Pain Loc --      Pain Edu? --      Excl. in [place]? --    No data found.  Updated Vital Signs BP 140/68 (BP Location: Left Arm)   Pulse 62   Temp 98.3 F (36.8 C) (Oral)   Resp 18   SpO2 97%   Visual Acuity Right Eye Distance:   Left Eye Distance:   Bilateral Distance:    Right Eye Near:   Left Eye Near:    Bilateral Near:     Physical Exam Neck:     Musculoskeletal: Normal range of motion and neck supple.  Cardiovascular:     Rate and Rhythm: Normal rate and regular rhythm.     Pulses: Normal pulses.     Heart sounds: Normal heart sounds.  Pulmonary:     Effort: Pulmonary effort is normal.  Abdominal:     General: Abdomen is flat.  Skin:    General: Skin is warm.     Capillary Refill: Capillary refill takes less than 2 seconds.     Coloration: Skin is not jaundiced.     Findings: No bruising.  Neurological:     General: No focal deficit present.     Mental Status: She is oriented to person, place, and time.      UC Treatments / Results  Labs (all labs ordered are listed, but only abnormal results are displayed) Labs Reviewed  POCT URINALYSIS DIP (DEVICE) - Abnormal; Notable for the following components:      Result Value   Hgb urine dipstick MODERATE (*)    Leukocytes,Ua LARGE (*)    All other components within normal limits  URINE CULTURE    EKG   Radiology No results found.  Procedures Procedures (including critical  care time)  Medications Ordered in UC Medications - No data to display  Initial Impression / Assessment and Plan / UC Course  I have reviewed the triage vital signs and the nursing notes.  Pertinent labs & imaging results that were available during my care of the patient were reviewed by me and considered in my medical decision making (see chart for details).     1.  Acute cystitis with hematuria: Ciprofloxacin 500 mg twice daily for 7 days Urine cultures Pyridium 200 mg three times daily for 2 days Patient will benefit from a longer duration of treatment and 7 days of antibiotics. Final Clinical Impressions(s) / UC Diagnoses   Final diagnoses:  Acute cystitis with hematuria   Discharge Instructions   None    ED Prescriptions    Medication Sig Dispense Auth. Provider   ciprofloxacin (CIPRO) 500 MG tablet Take 1 tablet (500 mg total) by mouth every 12 (twelve) hours for 7 days. 14 tablet , Myrene Galas, MD   phenazopyridine (PYRIDIUM) 200 MG tablet Take 1 tablet (200 mg total) by mouth 3 (three) times daily. 6 tablet , Myrene Galas, MD     Controlled Substance Prescriptions [place] Controlled Substance Registry consulted? No   Chase Picket, MD 03/04/19 [phone]

## 2019-03-04 NOTE — ED Triage Notes (Signed)
Per Pt she has been treated for UTI  And was giving antibiotics. Pt symptoms has gotten worst over time. She is still have urgency to go to the bathroom

## 2019-03-05 LAB — URINE CULTURE: Culture: <10000 — AB

## 2019-03-11 DIAGNOSIS — M5136 Other intervertebral disc degeneration, lumbar region: Secondary | ICD-10-CM | POA: Diagnosis not present

## 2019-03-11 DIAGNOSIS — M4722 Other spondylosis with radiculopathy, cervical region: Secondary | ICD-10-CM | POA: Diagnosis not present

## 2019-03-11 DIAGNOSIS — M4726 Other spondylosis with radiculopathy, lumbar region: Secondary | ICD-10-CM | POA: Diagnosis not present

## 2019-03-11 DIAGNOSIS — M5416 Radiculopathy, lumbar region: Secondary | ICD-10-CM | POA: Diagnosis not present

## 2019-03-11 DIAGNOSIS — M5030 Other cervical disc degeneration, unspecified cervical region: Secondary | ICD-10-CM | POA: Diagnosis not present

## 2019-03-11 DIAGNOSIS — M5412 Radiculopathy, cervical region: Secondary | ICD-10-CM | POA: Diagnosis not present

## 2019-03-14 ENCOUNTER — Other Ambulatory Visit (HOSPITAL_BASED_OUTPATIENT_CLINIC_OR_DEPARTMENT_OTHER): Payer: Self-pay | Admitting: Obstetrics and Gynecology

## 2019-03-14 DIAGNOSIS — Z1239 Encounter for other screening for malignant neoplasm of breast: Secondary | ICD-10-CM

## 2019-03-16 MED FILL — valACYclovir HCL 1 GM TABS: 1 | 90 days supply | Qty: 90 | Fill #0

## 2019-03-16 MED FILL — ESOMEPRAZOLE MAG DR 40 MG C: 40 | 30 days supply | Qty: 30 | Fill #1

## 2019-03-22 DIAGNOSIS — Z96651 Presence of right artificial knee joint: Secondary | ICD-10-CM | POA: Diagnosis not present

## 2019-03-22 DIAGNOSIS — M25561 Pain in right knee: Secondary | ICD-10-CM | POA: Diagnosis not present

## 2019-03-24 ENCOUNTER — Other Ambulatory Visit: Payer: Self-pay | Admitting: Orthopedic Surgery

## 2019-03-24 ENCOUNTER — Other Ambulatory Visit (HOSPITAL_COMMUNITY): Payer: Self-pay | Admitting: Orthopedic Surgery

## 2019-03-24 DIAGNOSIS — M25561 Pain in right knee: Secondary | ICD-10-CM

## 2019-03-28 ENCOUNTER — Ambulatory Visit (HOSPITAL_COMMUNITY)
Admission: RE | Admit: 2019-03-28 | Discharge: 2019-03-28 | Disposition: A | Discharge status: Home / Self Care | Payer: 59 | Source: Ambulatory Visit | Attending: Orthopedic Surgery | Admitting: Orthopedic Surgery

## 2019-03-28 ENCOUNTER — Other Ambulatory Visit: Payer: Self-pay

## 2019-03-28 DIAGNOSIS — M25561 Pain in right knee: Secondary | ICD-10-CM | POA: Diagnosis not present

## 2019-03-28 DIAGNOSIS — M7989 Other specified soft tissue disorders: Secondary | ICD-10-CM | POA: Diagnosis not present

## 2019-03-28 MED ORDER — TECHNETIUM TC 99M MEDRONATE IV KIT
20.0000 | PACK | Freq: Once | INTRAVENOUS | Status: AC | PRN
  Administered 2019-03-28: 10:00:00 20 via INTRAVENOUS

## 2019-03-29 ENCOUNTER — Ambulatory Visit (HOSPITAL_BASED_OUTPATIENT_CLINIC_OR_DEPARTMENT_OTHER): Payer: 59

## 2019-03-29 ENCOUNTER — Telehealth: Payer: Self-pay | Admitting: Internal Medicine

## 2019-03-29 ENCOUNTER — Other Ambulatory Visit: Payer: Self-pay | Admitting: Orthopedic Surgery

## 2019-03-29 DIAGNOSIS — M25561 Pain in right knee: Secondary | ICD-10-CM | POA: Diagnosis not present

## 2019-03-29 NOTE — Telephone Encounter (Signed)
New Message    Pt is calling because she needs an appt with Dr Lovena Le before 08/19  She says she is scheduled for a knee Replacement with [facility] on 08/19     Please call

## 2019-03-30 ENCOUNTER — Telehealth (HOSPITAL_COMMUNITY): Payer: Self-pay | Admitting: Emergency Medicine

## 2019-03-30 ENCOUNTER — Ambulatory Visit (HOSPITAL_COMMUNITY)
Admission: EM | Admit: 2019-03-30 | Discharge: 2019-03-30 | Disposition: A | Discharge status: Home / Self Care | Payer: 59 | Attending: Urgent Care | Admitting: Urgent Care

## 2019-03-30 ENCOUNTER — Other Ambulatory Visit: Payer: Self-pay

## 2019-03-30 ENCOUNTER — Encounter (HOSPITAL_COMMUNITY): Payer: Self-pay | Admitting: Emergency Medicine

## 2019-03-30 DIAGNOSIS — N3090 Cystitis, unspecified without hematuria: Secondary | ICD-10-CM | POA: Diagnosis not present

## 2019-03-30 DIAGNOSIS — R300 Dysuria: Secondary | ICD-10-CM | POA: Diagnosis not present

## 2019-03-30 DIAGNOSIS — N390 Urinary tract infection, site not specified: Secondary | ICD-10-CM | POA: Insufficient documentation

## 2019-03-30 DIAGNOSIS — R350 Frequency of micturition: Secondary | ICD-10-CM | POA: Diagnosis not present

## 2019-03-30 LAB — POCT URINALYSIS DIP (DEVICE)
Bilirubin Urine: NEGATIVE
Glucose, UA: NEGATIVE mg/dL
Ketones, ur: NEGATIVE mg/dL
Nitrite: NEGATIVE
Protein, ur: NEGATIVE mg/dL
Specific Gravity, Urine: 1.01 (ref 1.005–1.030)
Urobilinogen, UA: 0.2 mg/dL (ref 0.0–1.0)
pH: 7 (ref 5.0–8.0)

## 2019-03-30 MED ORDER — CIPROFLOXACIN HCL 500 MG PO TABS: 500.0000 mg | ORAL_TABLET | Freq: Two times a day (BID) | ORAL | 0 refills | Status: DC

## 2019-03-30 NOTE — Discharge Instructions (Signed)
Please make sure that you hydrate very well with plain water.  Avoid urinary irritants including soda, coffee, green tea, sweet tea as this could be contributing to your symptoms.  Otherwise we will cover you for a recurrent urinary tract infection with ciprofloxacin.  Make sure that you discuss this with your PCP so that you can get a referral for a consult with a urologist.

## 2019-03-30 NOTE — ED Provider Notes (Signed)
MRN: [id number] DOB: 04-24-1951  Subjective:   Kari [name] is a 68 y.o. female presenting for 1 day history of recurrent moderate dysuria and urinary frequency, urinary urgency, pelvic discomfort. Has a history of frequent UTI. Underwent Augmentin course for dental work in the past 2 weeks and thinks this started her UTI again. Denies hematuria, flank pain, abdominal pain, genital rash and vaginal discharge, nausea and vomiting.  Her last urine culture on 02/26/2019 showed sensitivity to ciprofloxacin and Macrobid.  Unfortunately patient has allergies to Ceftin, itching, and is highly allergic to Bactrim.  She has tolerated ciprofloxacin well and earlier in July needed to 10-day course to resolve her urinary tract infection.  The urine culture done on 03/04/2019 did not show significant growth however.  Patient does admit that she has done okay with Keflex in the past despite allergy to Ceftin.  No current facility-administered medications for this encounter.   Current Outpatient Medications:  .  aspirin EC 81 MG tablet, Take 81 mg by mouth daily., Disp: , Rfl:  .  Cholecalciferol (VITAMIN D3) 5000 units CAPS, Take 5,000 Units by mouth daily., Disp: , Rfl:  .  Cyanocobalamin (VITAMIN B-12) 2500 MCG SUBL, Place 5,000 mcg under the tongue daily. , Disp: , Rfl:  .  esomeprazole (NEXIUM) 40 MG capsule, Take 40 mg by mouth daily. , Disp: , Rfl: 9 .  fluticasone (FLONASE) 50 MCG/ACT nasal spray, Place 1 spray into both nostrils daily. , Disp: , Rfl:  .  HYDROcodone-acetaminophen (NORCO/VICODIN) 5-325 MG tablet, Take 1-2 tablets by mouth every 4 (four) hours as needed for moderate pain., Disp: 30 tablet, Rfl: 0 .  ibuprofen (ADVIL,MOTRIN) 400 MG tablet, Take 400 mg by mouth every 6 (six) hours as needed., Disp: , Rfl:  .  Melatonin 10 MG TABS, Take 20-30 mg by mouth 3 (three) times a week. As needed for sleep, Disp: , Rfl:  .  Multiple Vitamin (MULTIVITAMIN WITH MINERALS) TABS, Take 1 tablet by  mouth daily., Disp: , Rfl:  .  Omega-3 Fatty Acids (FISH OIL ULTRA) 1400 MG CAPS, Take 1,400 mg by mouth daily., Disp: , Rfl:  .  phenazopyridine (PYRIDIUM) 200 MG tablet, Take 1 tablet (200 mg total) by mouth 3 (three) times daily., Disp: 6 tablet, Rfl: 0 .  Probiotic Product (PROBIOTIC FORMULA PO), Take 1 capsule by mouth daily. , Disp: , Rfl:  .  valACYclovir (VALTREX) 1000 MG tablet, Take 1,000 mg by mouth daily as needed (herpes zoster). , Disp: , Rfl:    Allergies  Allergen Reactions  . Ceftin Itching    Tolerates amoxicillin  . Vancomycin Hives  . Bactrim [Sulfamethoxazole-Trimethoprim]   . Levofloxacin Other (See Comments)    "spacey," tolerates Cipro    Past Medical History:  Diagnosis Date  . Arthritis   . Chronic back pain   . Diverticulitis   . Herpes zoster conjunctivitis   . Menopause   . Mobitz type 2 second degree AV block    2:1/notes 02/16/2013  . PONV (postoperative nausea and vomiting)   . Presence of permanent cardiac pacemaker   . Seasonal allergies    "spring & fall; not q year" (02/17/2013)  . Shoulder impingement    and left rotator cuff tear     Past Surgical History:  Procedure Laterality Date  . ACHILLES TENDON SURGERY    . ARTHROGRAM KNEE Right 1975  . CHOLECYSTECTOMY  1990's  . colonscopy    . ESOPHAGOGASTRODUODENOSCOPY    . EYE SURGERY  muscle release right eye  . INSERT / REPLACE / REMOVE PACEMAKER  02/17/2013   [employer] Advantio dual-chamber pacemaker, model [id number],  . KNEE ARTHROSCOPY Right 1982, 1983, 1995, 1996  . KNEE ARTHROSCOPY W/ ACL RECONSTRUCTION Left 1984  . LAPAROSCOPIC SIGMOID COLECTOMY N/A 01/01/2015   Procedure: LAPAROSCOPIC SIGMOID COLECTOMY;  Surgeon: Stark Klein, MD;  Location: [place];  Service: General;  Laterality: N/A;  . PERMANENT PACEMAKER INSERTION N/A 02/17/2013   Procedure: PERMANENT PACEMAKER INSERTION;  Surgeon: Evans Lance, MD;  Location: [facility] CATH LAB;  Service: Cardiovascular;  Laterality: N/A;   . SHOULDER ARTHROSCOPY Left 09/05/2016   Procedure: LEFT SHOULDER ARTHROSCOPY , ACROMIOPLASTY AND ROTATOR CUFF REPAIR;  Surgeon: Melrose Nakayama, MD;  Location: [place];  Service: Orthopedics;  Laterality: Left;  . TONSILLECTOMY  1961  . TOTAL KNEE ARTHROPLASTY Right 2000; 06/2000   "replaced w/appropriate hardware" (02/17/2013)  . TRIGGER FINGER RELEASE Right ~ 2012   "thumb" (02/17/2013)  . VAGINAL HYSTERECTOMY  ~ 1994   partial     ROS  Objective:   Vitals: BP 138/86 (BP Location: Right Arm)   Pulse (!) 57   Temp 97.8 F (36.6 C) (Oral)   Resp 18   SpO2 99%   Physical Exam Constitutional:      General: She is not in acute distress.    Appearance: Normal appearance. She is well-developed. She is not ill-appearing.  HENT:     Head: Normocephalic and atraumatic.     Nose: Nose normal.     Mouth/Throat:     Mouth: Mucous membranes are moist.     Pharynx: Oropharynx is clear.  Eyes:     General: No scleral icterus.    Extraocular Movements: Extraocular movements intact.     Pupils: Pupils are equal, round, and reactive to light.  Cardiovascular:     Rate and Rhythm: Normal rate.  Pulmonary:     Effort: Pulmonary effort is normal.  Abdominal:     Tenderness: There is no right CVA tenderness or left CVA tenderness.  Skin:    General: Skin is warm and dry.  Neurological:     General: No focal deficit present.     Mental Status: She is alert and oriented to person, place, and time.  Psychiatric:        Mood and Affect: Mood normal.        Behavior: Behavior normal.        Thought Content: Thought content normal.        Judgment: Judgment normal.     Results for orders placed or performed during the hospital encounter of 03/30/19 (from the past 24 hour(s))  POCT urinalysis dip (device)     Status: Abnormal   Collection Time: 03/30/19 10:34 AM  Result Value Ref Range   Glucose, UA NEGATIVE NEGATIVE mg/dL   Bilirubin Urine NEGATIVE NEGATIVE   Ketones, ur NEGATIVE  NEGATIVE mg/dL   Specific Gravity, Urine 1.010 1.005 - 1.030   Hgb urine dipstick MODERATE (A) NEGATIVE   pH 7.0 5.0 - 8.0   Protein, ur NEGATIVE NEGATIVE mg/dL   Urobilinogen, UA 0.2 0.0 - 1.0 mg/dL   Nitrite NEGATIVE NEGATIVE   Leukocytes,Ua MODERATE (A) NEGATIVE    Assessment and Plan :    1. Frequent UTI   2. Dysuria   3. Urinary frequency    Will cover for recurrent cystitis with ciprofloxacin at patient's request.  I counseled that it would be worthwhile to consider Macrobid in the future given  that she is also able to take this.  Emphasized need for urology referral for further management of recurrent UTI. Counseled patient on potential for adverse effects with medications prescribed/recommended today, ER and return-to-clinic precautions discussed, patient verbalized understanding.    Jaynee Eagles, PA-C 03/30/19 1039

## 2019-03-30 NOTE — Telephone Encounter (Signed)
Follow up     [facility] for pt to return call about appt with Dr. Lovena Le.

## 2019-03-30 NOTE — ED Triage Notes (Signed)
Pt here with dysuria and frequency

## 2019-03-31 LAB — URINE CULTURE: Culture: <10000 — AB

## 2019-03-31 NOTE — Telephone Encounter (Signed)
Error

## 2019-04-01 ENCOUNTER — Ambulatory Visit (INDEPENDENT_AMBULATORY_CARE_PROVIDER_SITE_OTHER): Payer: 59 | Admitting: Family Medicine

## 2019-04-01 ENCOUNTER — Encounter: Payer: Self-pay | Admitting: Family Medicine

## 2019-04-01 ENCOUNTER — Other Ambulatory Visit: Payer: Self-pay

## 2019-04-01 ENCOUNTER — Telehealth: Payer: Self-pay | Admitting: Family Medicine

## 2019-04-01 VITALS — BP 140/82 | HR 71 | Temp 98.2°F

## 2019-04-01 DIAGNOSIS — Z0000 Encounter for general adult medical examination without abnormal findings: Secondary | ICD-10-CM | POA: Diagnosis not present

## 2019-04-01 LAB — LIPID PANEL
Cholesterol: 166 mg/dL (ref 0–200)
HDL: 46.9 mg/dL (ref >39.00)
LDL Cholesterol: 101 mg/dL — ABNORMAL HIGH (ref 0–99)
NonHDL: 119.49
Total CHOL/HDL Ratio: 4
Triglycerides: 94 mg/dL (ref 0.0–149.0)
VLDL: 18.8 mg/dL (ref 0.0–40.0)

## 2019-04-01 LAB — HEMOGLOBIN A1C: Hgb A1c MFr Bld: 6 % (ref 4.6–6.5)

## 2019-04-01 NOTE — Progress Notes (Signed)
Chief Complaint  Patient presents with  . Annual Exam     [place] is here for a complete physical.   Her last physical was >1 year ago.  Current diet: in general, a "healthy" diet. Current exercise: trying to walk. Weight is stable and she denies daytime fatigue. No LMP recorded. Patient has had a hysterectomy.  Seatbelt? Yes  Health Maintenance Colonoscopy- Yes  Shingrix: Yes DEXA-declines Mammogram- ordered Tetanus- Yes Hep C screen- declines  Past Medical History:  Diagnosis Date  . Arthritis   . Chronic back pain   . Diverticulitis   . Herpes zoster conjunctivitis   . Menopause   . Mobitz type 2 second degree AV block    2:1/notes 02/16/2013  . PONV (postoperative nausea and vomiting)   . Presence of permanent cardiac pacemaker   . Seasonal allergies    "spring & fall; not q year" (02/17/2013)  . Shoulder impingement    and left rotator cuff tear     Past Surgical History:  Procedure Laterality Date  . ACHILLES TENDON SURGERY    . ARTHROGRAM KNEE Right 1975  . CHOLECYSTECTOMY  1990's  . colonscopy    . ESOPHAGOGASTRODUODENOSCOPY    . EYE SURGERY     muscle release right eye  . INSERT / REPLACE / REMOVE PACEMAKER  02/17/2013   [employer] Advantio dual-chamber pacemaker, model [id number],  . KNEE ARTHROSCOPY Right 1982, 1983, 1995, 1996  . KNEE ARTHROSCOPY W/ ACL RECONSTRUCTION Left 1984  . LAPAROSCOPIC SIGMOID COLECTOMY N/A 01/01/2015   Procedure: LAPAROSCOPIC SIGMOID COLECTOMY;  Surgeon: Stark Klein, MD;  Location: [place];  Service: General;  Laterality: N/A;  . PERMANENT PACEMAKER INSERTION N/A 02/17/2013   Procedure: PERMANENT PACEMAKER INSERTION;  Surgeon: Evans Lance, MD;  Location: [facility] CATH LAB;  Service: Cardiovascular;  Laterality: N/A;  . SHOULDER ARTHROSCOPY Left 09/05/2016   Procedure: LEFT SHOULDER ARTHROSCOPY , ACROMIOPLASTY AND ROTATOR CUFF REPAIR;  Surgeon: Melrose Nakayama, MD;  Location: [place];  Service: Orthopedics;   Laterality: Left;  . TONSILLECTOMY  1961  . TOTAL KNEE ARTHROPLASTY Right 2000; 06/2000   "replaced w/appropriate hardware" (02/17/2013)  . TRIGGER FINGER RELEASE Right ~ 2012   "thumb" (02/17/2013)  . VAGINAL HYSTERECTOMY  ~ 1994   partial    Medications  Current Outpatient Medications on File Prior to Visit  Medication Sig Dispense Refill  . aspirin EC 81 MG tablet Take 81 mg by mouth daily.    . Cholecalciferol (VITAMIN D3) 5000 units CAPS Take 5,000 Units by mouth daily.    . ciprofloxacin (CIPRO) 500 MG tablet Take 1 tablet (500 mg total) by mouth every 12 (twelve) hours. 20 tablet 0  . Cyanocobalamin (VITAMIN B-12) 2500 MCG SUBL Place 5,000 mcg under the tongue daily.     Marland Kitchen esomeprazole (NEXIUM) 40 MG capsule Take 40 mg by mouth daily.   9  . fluticasone (FLONASE) 50 MCG/ACT nasal spray Place 1 spray into both nostrils daily.     Marland Kitchen HYDROcodone-acetaminophen (NORCO/VICODIN) 5-325 MG tablet Take 1-2 tablets by mouth every 4 (four) hours as needed for moderate pain. 30 tablet 0  . ibuprofen (ADVIL,MOTRIN) 400 MG tablet Take 400 mg by mouth every 6 (six) hours as needed.    . Melatonin 10 MG TABS Take 20-30 mg by mouth 3 (three) times a week. As needed for sleep    . Multiple Vitamin (MULTIVITAMIN WITH MINERALS) TABS Take 1 tablet by mouth daily.    . Omega-3 Fatty  Acids (FISH OIL ULTRA) 1400 MG CAPS Take 1,400 mg by mouth daily.    . phenazopyridine (PYRIDIUM) 200 MG tablet Take 1 tablet (200 mg total) by mouth 3 (three) times daily. 6 tablet 0  . Probiotic Product (PROBIOTIC FORMULA PO) Take 1 capsule by mouth daily.     . valACYclovir (VALTREX) 1000 MG tablet Take 1,000 mg by mouth daily as needed (herpes zoster).      Allergies Allergies  Allergen Reactions  . Ceftin Itching    Tolerates amoxicillin  . Vancomycin Hives  . Bactrim [Sulfamethoxazole-Trimethoprim] Hives  . Levofloxacin Other (See Comments)    "spacey," tolerates Cipro    Review of Systems: Constitutional:  no  sweats Eye:  no recent significant change in vision Ear/Nose/Mouth/Throat:  Ears:  No changes in hearing Nose/Mouth/Throat:  no complaints of nasal congestion, no sore throat Cardiovascular: no chest pain Respiratory:  No cough and no shortness of breath Gastrointestinal:  no abdominal pain, no change in bowel habits GU:  Female: negative for dysuria or pelvic pain Musculoskeletal/Extremities: +R knee pain; otherwise no pain of the joints Integumentary (Skin/Breast):  no abnormal skin lesions reported Neurologic:  no headaches Psychiatric:  no anxiety, no depression Endocrine:  denies unexplained weight changes Hematologic/Lymphatic:  no abnormal bleeding  Exam BP 140/82 (BP Location: Left Arm, Patient Position: Sitting, Cuff Size: Normal)   Pulse 71   Temp 98.2 F (36.8 C) (Oral)   SpO2 94%  General:  well developed, well nourished, in no apparent distress Skin:  no significant moles, warts, or growths Head:  no masses, lesions, or tenderness Eyes:  pupils equal and round, sclera anicteric without injection Ears:  canals without lesions, TMs shiny without retraction, no obvious effusion, no erythema Nose:  nares patent, septum midline, mucosa normal, and no drainage or sinus tenderness Throat/Pharynx:  lips and gingiva without lesion; tongue and uvula midline; non-inflamed pharynx; no exudates or postnasal drainage Neck: neck supple without adenopathy, thyromegaly, or masses Lungs:  clear to auscultation, breath sounds equal bilaterally, no respiratory distress Cardio:  regular rate and rhythm, no bruits or LE edema Abdomen:  abdomen soft, nontender; bowel sounds normal; no masses or organomegaly Genital: Deferred Musculoskeletal:  symmetrical muscle groups noted without atrophy or deformity Neuro:  gait antalgic; deep tendon reflexes normal and symmetric Psych: well oriented with normal range of affect and appropriate judgment/insight  Assessment and Plan  Well adult exam -  Plan: Lipid panel, Hemoglobin A1c, declined anything that may be repeated. Declined Hep c screening and DEXA.    Well 68 y.o. female. Counseled on diet and exercise. Barring abn results, should be low risk for surgery.  Other orders as above. Follow up in 1 year pending the above workup. The patient voiced understanding and agreement to the plan.  [place]o [place], DO 04/01/19 10:56 AM

## 2019-04-01 NOTE — Patient Instructions (Addendum)
Let us know if you need anything.  Let me know about the tetanus shot and Shingles vaccine.   Keep the diet clean and stay active.  Let us know if you need anything.

## 2019-04-01 NOTE — Telephone Encounter (Signed)
Called to sch cpe for next year, pt to call back.

## 2019-04-07 ENCOUNTER — Ambulatory Visit (INDEPENDENT_AMBULATORY_CARE_PROVIDER_SITE_OTHER): Payer: 59 | Admitting: Student

## 2019-04-07 ENCOUNTER — Other Ambulatory Visit: Payer: Self-pay

## 2019-04-07 VITALS — BP 144/84 | HR 69 | Ht 68.0 in | Wt 155.0 lb

## 2019-04-07 DIAGNOSIS — I442 Atrioventricular block, complete: Secondary | ICD-10-CM | POA: Diagnosis not present

## 2019-04-07 LAB — CUP PACEART INCLINIC DEVICE CHECK
Brady Statistic RV Percent Paced: 100 %
Date Time Interrogation Session: 20200813040000
Implantable Lead Implant Date: 20140626
Implantable Lead Implant Date: 20140626
Implantable Lead Location: 753859
Implantable Lead Location: 753860
Implantable Lead Model: 5076
Implantable Lead Model: 5076
Implantable Pulse Generator Implant Date: 20140626
Lead Channel Impedance Value: 437 Ohm
Lead Channel Impedance Value: 529 Ohm
Lead Channel Pacing Threshold Amplitude: 0.7 V
Lead Channel Pacing Threshold Amplitude: 1.1 V
Lead Channel Pacing Threshold Pulse Width: 0.4 ms
Lead Channel Pacing Threshold Pulse Width: 0.5 ms
Lead Channel Sensing Intrinsic Amplitude: 5.6 mV
Lead Channel Setting Pacing Amplitude: 2 V
Lead Channel Setting Pacing Amplitude: 2.4 V
Lead Channel Setting Pacing Pulse Width: 0.4 ms
Lead Channel Setting Sensing Sensitivity: 8 mV
Pulse Gen Serial Number: 112476

## 2019-04-07 NOTE — Progress Notes (Signed)
Electrophysiology Office Note Date: 04/07/2019  ID:  Kari [name], Kari [name] 03/05/1951, MRN [id number]  PCP: Kari Pal, DO Primary Cardiologist: Kari Peru, MD Electrophysiologist: None  CC: Pacemaker follow-up  [name] is a 68 y.o. female seen today for Kari [name]. She presents today for routine electrophysiology followup and for cardiac clearance pending R knee revision.    Since last being seen in our clinic, the patient reports doing very well. She denies exertional chest pain, palpitations, dyspnea, PND, orthopnea, nausea, vomiting, dizziness, syncope, edema, weight gain, or early satiety. She continues to work as an Therapist, sports at [employer] and is only limited physically by arthritis.   Device History: Engineer, agricultural PPM implanted 02/17/2013 for Complete Heart Block  Past Medical History:  Diagnosis Date  . Arthritis   . Chronic back pain   . Diverticulitis   . Herpes zoster conjunctivitis   . Menopause   . Mobitz type 2 second degree AV block    2:1/notes 02/16/2013  . PONV (postoperative nausea and vomiting)   . Presence of permanent cardiac pacemaker   . Seasonal allergies    "spring & fall; not q year" (02/17/2013)  . Shoulder impingement    and left rotator cuff tear   Past Surgical History:  Procedure Laterality Date  . ACHILLES TENDON SURGERY    . ARTHROGRAM KNEE Right 1975  . CHOLECYSTECTOMY  1990's  . colonscopy    . ESOPHAGOGASTRODUODENOSCOPY    . EYE SURGERY     muscle release right eye  . INSERT / REPLACE / REMOVE PACEMAKER  02/17/2013   [employer] Advantio dual-chamber pacemaker, model [id number],  . KNEE ARTHROSCOPY Right 1982, 1983, 1995, 1996  . KNEE ARTHROSCOPY W/ ACL RECONSTRUCTION Left 1984  . LAPAROSCOPIC SIGMOID COLECTOMY N/A 01/01/2015   Procedure: LAPAROSCOPIC SIGMOID COLECTOMY;  Surgeon: Kari Klein, MD;  Location: [place];  Service: General;  Laterality: N/A;  . PERMANENT PACEMAKER  INSERTION N/A 02/17/2013   Procedure: PERMANENT PACEMAKER INSERTION;  Surgeon: Kari Lance, MD;  Location: [facility] CATH LAB;  Service: Cardiovascular;  Laterality: N/A;  . SHOULDER ARTHROSCOPY Left 09/05/2016   Procedure: LEFT SHOULDER ARTHROSCOPY , ACROMIOPLASTY AND ROTATOR CUFF REPAIR;  Surgeon: Kari Nakayama, MD;  Location: [place];  Service: Orthopedics;  Laterality: Left;  . TONSILLECTOMY  1961  . TOTAL KNEE ARTHROPLASTY Right 2000; 06/2000   "replaced w/appropriate hardware" (02/17/2013)  . TRIGGER FINGER RELEASE Right ~ 2012   "thumb" (02/17/2013)  . VAGINAL HYSTERECTOMY  ~ 1994   partial     Current Outpatient Medications  Medication Sig Dispense Refill  . acetaminophen (TYLENOL) 500 MG tablet Take 1,000 mg by mouth every 6 (six) hours as needed (for pain).    Marland Kitchen aspirin EC 81 MG tablet Take 81 mg by mouth daily.    . Calcium Carb-Cholecalciferol (CALCIUM 600+D3 PO) Take 1 tablet by mouth daily.    . ciprofloxacin (CIPRO) 500 MG tablet Take 1 tablet (500 mg total) by mouth every 12 (twelve) hours. 20 tablet 0  . Cyanocobalamin (VITAMIN B-12) 2500 MCG SUBL Place 2,500 mcg under the tongue daily.     . diclofenac sodium (VOLTAREN) 1 % GEL Apply 1-2 g topically 4 (four) times daily as needed (knee pain.).    Marland Kitchen esomeprazole (NEXIUM) 40 MG capsule Take 40 mg by mouth daily.   9  . fluticasone (FLONASE) 50 MCG/ACT nasal spray Place 1 spray into both nostrils daily as needed for allergies.     Marland Kitchen  Melatonin 3 MG TABS Take 6 mg by mouth at bedtime as needed (sleep.).    Marland Kitchen Multiple Vitamin (MULTIVITAMIN WITH MINERALS) TABS Take 1 tablet by mouth daily.    . Omega-3 Fatty Acids (FISH OIL ULTRA) 1400 MG CAPS Take 1,400 mg by mouth daily.    . Probiotic Product (PROBIOTIC FORMULA PO) Take 1 capsule by mouth daily.     . valACYclovir (VALTREX) 1000 MG tablet Take 1,000 mg by mouth daily as needed (herpes zoster).      No current facility-administered medications for this visit.     Allergies:    Ceftin, Vancomycin, Bactrim [sulfamethoxazole-trimethoprim], and Levofloxacin   Social History: Social History   Socioeconomic History  . Marital status: Married    Spouse name: Not on file  . Number of children: Not on file  . Years of education: Not on file  . Highest education level: Not on file  Occupational History  . Occupation: Programmer, multimedia: [employer]: [employer] Needs  . Financial resource strain: Not on file  . Food insecurity    Worry: Not on file    Inability: Not on file  . Transportation needs    Medical: Not on file    Non-medical: Not on file  Tobacco Use  . Smoking status: Never Smoker  . Smokeless tobacco: Never Used  Substance and Sexual Activity  . Alcohol use: Yes    Comment: occasional wine  . Drug use: No  . Sexual activity: Yes    Birth control/protection: Surgical    Comment: 02/17/2013 "I have a same sex partner"  Lifestyle  . Physical activity    Days per week: Not on file    Minutes per session: Not on file  . Stress: Not on file  Relationships  . Social Herbalist on phone: Not on file    Gets together: Not on file    Attends religious service: Not on file    Active member of club or organization: Not on file    Attends meetings of clubs or organizations: Not on file    Relationship status: Not on file  . Intimate partner violence    Fear of current or ex partner: Not on file    Emotionally abused: Not on file    Physically abused: Not on file    Forced sexual activity: Not on file  Other Topics Concern  . Not on file  Social History Narrative  . Not on file    Family History: Family History  Problem Relation Age of Onset  . Hypertension Mother   . Hyperlipidemia Mother   . Macular degeneration Mother   . COPD Mother   . Lung cancer Father   . Cancer Father        Lung  . COPD Father   . Diabetes Brother   . Breast cancer Paternal Aunt      Review of Systems: All  other systems reviewed and are otherwise negative except as noted above.  Physical Exam: Vitals:   04/07/19 1340  BP: (!) 144/84  Pulse: 69  Weight: 155 lb (70.3 kg)  Height: 5\' 8"  (1.727 m)     GEN- The patient is well appearing, alert and oriented x 3 today.   HEENT: normocephalic, atraumatic; sclera clear, conjunctiva pink; hearing intact; oropharynx clear; neck supple  Lungs- Clear to ausculation bilaterally, normal work of breathing.  No wheezes, rales,  rhonchi Heart- Regular rate and rhythm, no murmurs, rubs or gallops  GI- soft, non-tender, non-distended, bowel sounds present  Extremities- no clubbing, cyanosis, or edema  MS- no significant deformity or atrophy Skin- warm and dry, no rash or lesion; PPM pocket well healed Psych- euthymic mood, full affect Neuro- strength and sensation are intact  PPM Interrogation- reviewed in detail today,  See PACEART report  EKG:  EKG is ordered today. The ekg ordered today shows V paced rhythm at 69 bpm, personally reviewed.   Recent Labs: No results found for requested labs within last 8760 hours.   Wt Readings from Last 3 Encounters:  04/07/19 155 lb (70.3 kg)  09/27/18 169 lb (76.7 kg)  04/20/18 169 lb (76.7 kg)     Other studies Reviewed: Additional studies/ records that were reviewed today include: previous labs, echo, and device checks.  Assessment and Plan:  1.  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 Sensitivities adjusted previously (2018). Seen again today. Discussed with Kari [name] of [employer]. No adjustments at this time. Thresholds and impedence stable.  No changes today  2. HTN Continue current medications  3. Pending R Knee revision Pt is cleared for surgery and this should not interfere with her pacemaker.  If heart rate depression  is noted (<50), a magnet can be placed on the device for the remainder of the procedure, which will cause her device to pace asynchronously (She will likely feel palpitations during this time and be uncomfortable if she is awake; she is very symptomatic during threshold testing)  Current medicines are reviewed at length with the patient today.   The patient does not have concerns regarding her medicines.  The following changes were made today:  none  Labs/ tests ordered today include:  Orders Placed This Encounter  Procedures  . CUP PACEART INCLINIC DEVICE CHECK    Disposition:   Follow up with Kari [name] annually. Continue remotes every 3 months.   Jacalyn Lefevre, PA-C  04/07/2019 3:15 PM  [place] [address] [phone] (office) [phone] (fax)

## 2019-04-08 NOTE — Progress Notes (Signed)
EKG 04-07-19 Epic [facility] AS ACTIVE) DEVICE CHECK NOTE 04-07-19 Epic CARDIAC CLEARANCE NOTE Barrington Ellison PA 04-07-19 EPIC

## 2019-04-08 NOTE — Patient Instructions (Addendum)
YOU HAD  A COVID 19 TEST ON 04-09-2019. ONCE YOUR COVID TEST IS COMPLETED, PLEASE BEGIN THE QUARANTINE INSTRUCTIONS AS OUTLINED IN YOUR HANDOUT.                [place]    Your procedure is scheduled on: 04-13-2019  Report to [facility] Main  Entrance  Report to  [place] at 530  AM   1 VISITOR IS ALLOWED TO WAIT IN WAITING ROOM  ONLY DAY OF YOUR SURGERY. NO VISITORS ARE ALLOWED IN SHORT STAY OR RECOVERY ROOM.   Call this number if you have problems the morning of surgery [phone]    Remember: [place], NO CHEWING GUM CANDY OR MINTS.   NO SOLID FOOD AFTER MIDNIGHT THE NIGHT PRIOR TO SURGERY. NOTHING BY MOUTH EXCEPT CLEAR LIQUIDS UNTIL  530 AM  . PLEASE FINISH ENSURE DRINK PER SURGEON ORDER  WHICH NEEDS TO BE COMPLETED AT  530AM .   CLEAR LIQUID DIET   Foods Allowed                                                                     Foods Excluded  Coffee and tea, regular and decaf                             liquids that you cannot  Plain Jell-O any favor except red or purple                                           see through such as: Fruit ices (not with fruit pulp)                                     milk, soups, orange juice  Iced Popsicles                                    All solid food Carbonated beverages, regular and diet                                    Cranberry, grape and apple juices Sports drinks like Gatorade Lightly seasoned clear broth or consume(fat free) Sugar, honey syrup  Sample Menu Breakfast                                Lunch                                     Supper Cranberry juice                    Beef broth  Chicken broth Jell-O                                     Grape juice                           Apple juice Coffee or tea                        Jell-O                                      Popsicle                                                 Coffee or tea                        Coffee or tea  _____________________________________________________________________     Take these medicines the morning of surgery with A SIP OF WATER: Nexium if needed                                 You may not have any metal on your body including hair pins and              piercings  Do not wear jewelry, make-up, lotions, powders or perfumes, deodorant             Do not wear nail polish.  Do not shave  48 hours prior to surgery.              Men may shave face and neck.   Do not bring valuables to the hospital. [place].  Contacts, dentures or bridgework may not be worn into surgery.  Leave suitcase in the car. After surgery it may be brought to your room.      _____________________________________________________________________             [facility] - Preparing for Surgery Before surgery, you can play an important role.  Because skin is not sterile, your skin needs to be as free of germs as possible.  You can reduce the number of germs on your skin by washing with CHG (chlorahexidine gluconate) soap before surgery.  CHG is an antiseptic cleaner which kills germs and bonds with the skin to continue killing germs even after washing. Please DO NOT use if you have an allergy to CHG or antibacterial soaps.  If your skin becomes reddened/irritated stop using the CHG and inform your nurse when you arrive at Short Stay. Do not shave (including legs and underarms) for at least 48 hours prior to the first CHG shower.  You may shave your face/neck. Please follow these instructions carefully:  1.  Shower with CHG Soap the night before surgery and the  morning of Surgery.  2.  If you choose to wash your hair, wash your hair first as usual with your  normal  shampoo.  3.  After  you shampoo, rinse your hair and body thoroughly to remove the  shampoo.                           4.  Use CHG as you  would any other liquid soap.  You can apply chg directly  to the skin and wash                       Gently with a scrungie or clean washcloth.  5.  Apply the CHG Soap to your body ONLY FROM THE NECK DOWN.   Do not use on face/ open                           Wound or open sores. Avoid contact with eyes, ears mouth and genitals (private parts).                       Wash face,  Genitals (private parts) with your normal soap.             6.  Wash thoroughly, paying special attention to the area where your surgery  will be performed.  7.  Thoroughly rinse your body with warm water from the neck down.  8.  DO NOT shower/wash with your normal soap after using and rinsing off  the CHG Soap.                9.  Pat yourself dry with a clean towel.            10.  Wear clean pajamas.            11.  Place clean sheets on your bed the night of your first shower and do not  sleep with pets. Day of Surgery : Do not apply any lotions/deodorants the morning of surgery.  Please wear clean clothes to the hospital/surgery center.  FAILURE TO FOLLOW THESE INSTRUCTIONS MAY RESULT IN THE CANCELLATION OF YOUR SURGERY PATIENT SIGNATURE_________________________________  NURSE SIGNATURE__________________________________  ________________________________________________________________________   Adam Phenix  An incentive spirometer is a tool that can help keep your lungs clear and active. This tool measures how well you are filling your lungs with each breath. Taking long deep breaths may help reverse or decrease the chance of developing breathing (pulmonary) problems (especially infection) following:  A long period of time when you are unable to move or be active. BEFORE THE PROCEDURE   If the spirometer includes an indicator to show your best effort, your nurse or respiratory therapist will set it to a desired goal.  If possible, sit up straight or lean slightly forward. Try not to slouch.  Hold  the incentive spirometer in an upright position. INSTRUCTIONS FOR USE  1. Sit on the edge of your bed if possible, or sit up as far as you can in bed or on a chair. 2. Hold the incentive spirometer in an upright position. 3. Breathe out normally. 4. Place the mouthpiece in your mouth and seal your lips tightly around it. 5. Breathe in slowly and as deeply as possible, raising the piston or the ball toward the top of the column. 6. Hold your breath for 3-5 seconds or for as long as possible. Allow the piston or ball to fall to the bottom of the column. 7. Remove the mouthpiece from your mouth and breathe out normally. 8. Rest for a  few seconds and repeat Steps 1 through 7 at least 10 times every 1-2 hours when you are awake. Take your time and take a few normal breaths between deep breaths. 9. The spirometer may include an indicator to show your best effort. Use the indicator as a goal to work toward during each repetition. 10. After each set of 10 deep breaths, practice coughing to be sure your lungs are clear. If you have an incision (the cut made at the time of surgery), support your incision when coughing by placing a pillow or rolled up towels firmly against it. Once you are able to get out of bed, walk around indoors and cough well. You may stop using the incentive spirometer when instructed by your caregiver.  RISKS AND COMPLICATIONS  Take your time so you do not get dizzy or light-headed.  If you are in pain, you may need to take or ask for pain medication before doing incentive spirometry. It is harder to take a deep breath if you are having pain. AFTER USE  Rest and breathe slowly and easily.  It can be helpful to keep track of a log of your progress. Your caregiver can provide you with a simple table to help with this. If you are using the spirometer at home, follow these instructions: [place] IF:   You are having difficultly using the spirometer.  You have trouble  using the spirometer as often as instructed.  Your pain medication is not giving enough relief while using the spirometer.  You develop fever of 100.5 F (38.1 C) or higher. SEEK IMMEDIATE MEDICAL CARE IF:   You cough up bloody sputum that had not been present before.  You develop fever of 102 F (38.9 C) or greater.  You develop worsening pain at or near the incision site. MAKE SURE YOU:   Understand these instructions.  Will watch your condition.  Will get help right away if you are not doing well or get worse. Document Released: 12/22/2006 Document Revised: 11/03/2011 Document Reviewed: 02/22/2007 ExitCare Patient Information 2014 ExitCare, [place].   ________________________________________________________________________  WHAT IS A BLOOD TRANSFUSION? Blood Transfusion Information  A transfusion is the replacement of blood or some of its parts. Blood is made up of multiple cells which provide different functions.  Red blood cells carry oxygen and are used for blood loss replacement.  White blood cells fight against infection.  Platelets control bleeding.  Plasma helps clot blood.  Other blood products are available for specialized needs, such as hemophilia or other clotting disorders. BEFORE THE TRANSFUSION  Who gives blood for transfusions?   Healthy volunteers who are fully evaluated to make sure their blood is safe. This is blood bank blood. Transfusion therapy is the safest it has ever been in the practice of medicine. Before blood is taken from a donor, a complete history is taken to make sure that person has no history of diseases nor engages in risky social behavior (examples are intravenous drug use or sexual activity with multiple partners). The donor's travel history is screened to minimize risk of transmitting infections, such as malaria. The donated blood is tested for signs of infectious diseases, such as HIV and hepatitis. The blood is then tested to be  sure it is compatible with you in order to minimize the chance of a transfusion reaction. If you or a relative donates blood, this is often done in anticipation of surgery and is not appropriate for emergency situations. It takes many days to  process the donated blood. RISKS AND COMPLICATIONS Although transfusion therapy is very safe and saves many lives, the main dangers of transfusion include:   Getting an infectious disease.  Developing a transfusion reaction. This is an allergic reaction to something in the blood you were given. Every precaution is taken to prevent this. The decision to have a blood transfusion has been considered carefully by your caregiver before blood is given. Blood is not given unless the benefits outweigh the risks. AFTER THE TRANSFUSION  Right after receiving a blood transfusion, you will usually feel much better and more energetic. This is especially true if your red blood cells have gotten low (anemic). The transfusion raises the level of the red blood cells which carry oxygen, and this usually causes an energy increase.  The nurse administering the transfusion will monitor you carefully for complications. HOME CARE INSTRUCTIONS  No special instructions are needed after a transfusion. You may find your energy is better. Speak with your caregiver about any limitations on activity for underlying diseases you may have. SEEK MEDICAL CARE IF:   Your condition is not improving after your transfusion.  You develop redness or irritation at the intravenous (IV) site. SEEK IMMEDIATE MEDICAL CARE IF:  Any of the following symptoms occur over the next 12 hours:  Shaking chills.  You have a temperature by mouth above 102 F (38.9 C), not controlled by medicine.  Chest, back, or muscle pain.  People around you feel you are not acting correctly or are confused.  Shortness of breath or difficulty breathing.  Dizziness and fainting.  You get a rash or develop  hives.  You have a decrease in urine output.  Your urine turns a dark color or changes to pink, red, or brown. Any of the following symptoms occur over the next 10 days:  You have a temperature by mouth above 102 F (38.9 C), not controlled by medicine.  Shortness of breath.  Weakness after normal activity.  The white part of the eye turns yellow (jaundice).  You have a decrease in the amount of urine or are urinating less often.  Your urine turns a dark color or changes to pink, red, or brown. Document Released: 08/08/2000 Document Revised: 11/03/2011 Document Reviewed: 03/27/2008 [facility] Patient Information 2014 [place], [place].  _______________________________________________________________________

## 2019-04-09 ENCOUNTER — Other Ambulatory Visit (HOSPITAL_COMMUNITY)
Admission: RE | Admit: 2019-04-09 | Discharge: 2019-04-09 | Disposition: A | Discharge status: Home / Self Care | Payer: 59 | Source: Ambulatory Visit | Attending: Orthopedic Surgery | Admitting: Orthopedic Surgery

## 2019-04-09 DIAGNOSIS — Z20828 Contact with and (suspected) exposure to other viral communicable diseases: Secondary | ICD-10-CM | POA: Insufficient documentation

## 2019-04-09 DIAGNOSIS — Z01812 Encounter for preprocedural laboratory examination: Secondary | ICD-10-CM | POA: Insufficient documentation

## 2019-04-09 LAB — SARS CORONAVIRUS 2 (TAT 6-24 HRS): SARS Coronavirus 2: NEGATIVE

## 2019-04-11 ENCOUNTER — Other Ambulatory Visit: Payer: Self-pay

## 2019-04-11 ENCOUNTER — Encounter (HOSPITAL_COMMUNITY)
Admission: RE | Admit: 2019-04-11 | Discharge: 2019-04-11 | Disposition: A | Discharge status: Home / Self Care | Payer: 59 | Source: Ambulatory Visit | Attending: Orthopedic Surgery | Admitting: Orthopedic Surgery

## 2019-04-11 ENCOUNTER — Ambulatory Visit (HOSPITAL_COMMUNITY)
Admission: RE | Admit: 2019-04-11 | Discharge: 2019-04-11 | Disposition: A | Discharge status: Home / Self Care | Payer: 59 | Source: Ambulatory Visit | Attending: Orthopedic Surgery | Admitting: Orthopedic Surgery

## 2019-04-11 ENCOUNTER — Telehealth: Payer: Self-pay | Admitting: Internal Medicine

## 2019-04-11 ENCOUNTER — Encounter (HOSPITAL_COMMUNITY): Payer: Self-pay

## 2019-04-11 DIAGNOSIS — Z20828 Contact with and (suspected) exposure to other viral communicable diseases: Secondary | ICD-10-CM | POA: Diagnosis not present

## 2019-04-11 DIAGNOSIS — H5711 Ocular pain, right eye: Secondary | ICD-10-CM | POA: Diagnosis not present

## 2019-04-11 DIAGNOSIS — E11319 Type 2 diabetes mellitus with unspecified diabetic retinopathy without macular edema: Secondary | ICD-10-CM | POA: Diagnosis not present

## 2019-04-11 DIAGNOSIS — K219 Gastro-esophageal reflux disease without esophagitis: Secondary | ICD-10-CM | POA: Diagnosis not present

## 2019-04-11 DIAGNOSIS — Z8349 Family history of other endocrine, nutritional and metabolic diseases: Secondary | ICD-10-CM | POA: Diagnosis not present

## 2019-04-11 DIAGNOSIS — G8929 Other chronic pain: Secondary | ICD-10-CM | POA: Diagnosis not present

## 2019-04-11 DIAGNOSIS — Z01818 Encounter for other preprocedural examination: Secondary | ICD-10-CM

## 2019-04-11 DIAGNOSIS — Z8249 Family history of ischemic heart disease and other diseases of the circulatory system: Secondary | ICD-10-CM | POA: Diagnosis not present

## 2019-04-11 DIAGNOSIS — T84032A Mechanical loosening of internal right knee prosthetic joint, initial encounter: Secondary | ICD-10-CM | POA: Diagnosis not present

## 2019-04-11 DIAGNOSIS — Z881 Allergy status to other antibiotic agents status: Secondary | ICD-10-CM | POA: Diagnosis not present

## 2019-04-11 HISTORY — DX: Acute embolism and thrombosis of unspecified deep veins of unspecified lower extremity: I82.409

## 2019-04-11 LAB — CBC WITH DIFFERENTIAL/PLATELET
Abs Immature Granulocytes: 0.01 10*3/uL (ref 0.00–0.07)
Basophils Absolute: 0 10*3/uL (ref 0.0–0.1)
Basophils Relative: 0 %
Eosinophils Absolute: 0.1 10*3/uL (ref 0.0–0.5)
Eosinophils Relative: 1 %
HCT: 39.1 % (ref 36.0–46.0)
Hemoglobin: 12.6 g/dL (ref 12.0–15.0)
Immature Granulocytes: 0 %
Lymphocytes Relative: 36 %
Lymphs Abs: 2.4 10*3/uL (ref 0.7–4.0)
MCH: 31.3 pg (ref 26.0–34.0)
MCHC: 32.2 g/dL (ref 30.0–36.0)
MCV: 97 fL (ref 80.0–100.0)
Monocytes Absolute: 0.4 10*3/uL (ref 0.1–1.0)
Monocytes Relative: 7 %
Neutro Abs: 3.7 10*3/uL (ref 1.7–7.7)
Neutrophils Relative %: 56 %
Platelets: 306 10*3/uL (ref 150–400)
RBC: 4.03 MIL/uL (ref 3.87–5.11)
RDW: 13.8 % (ref 11.5–15.5)
WBC: 6.6 10*3/uL (ref 4.0–10.5)
nRBC: 0 % (ref 0.0–0.2)

## 2019-04-11 LAB — BASIC METABOLIC PANEL
Anion gap: 10 (ref 5–15)
BUN: 19 mg/dL (ref 8–23)
CO2: 26 mmol/L (ref 22–32)
Calcium: 9.5 mg/dL (ref 8.9–10.3)
Chloride: 102 mmol/L (ref 98–111)
Creatinine, Ser: 0.83 mg/dL (ref 0.44–1.00)
GFR calc Af Amer: >60 mL/min (ref >60)
GFR calc non Af Amer: >60 mL/min (ref >60)
Glucose, Bld: 106 mg/dL — ABNORMAL HIGH (ref 70–99)
Potassium: 4.7 mmol/L (ref 3.5–5.1)
Sodium: 138 mmol/L (ref 135–145)

## 2019-04-11 LAB — URINALYSIS, ROUTINE W REFLEX MICROSCOPIC
Bilirubin Urine: NEGATIVE
Glucose, UA: NEGATIVE mg/dL
Hgb urine dipstick: NEGATIVE
Ketones, ur: NEGATIVE mg/dL
Leukocytes,Ua: NEGATIVE
Nitrite: NEGATIVE
Protein, ur: NEGATIVE mg/dL
Specific Gravity, Urine: 1.014 (ref 1.005–1.030)
pH: 5 (ref 5.0–8.0)

## 2019-04-11 LAB — SURGICAL PCR SCREEN
MRSA, PCR: NEGATIVE
Staphylococcus aureus: NEGATIVE

## 2019-04-11 LAB — ABO/RH: ABO/RH(D): A POS

## 2019-04-11 LAB — PROTIME-INR
INR: 0.9 (ref 0.8–1.2)
Prothrombin Time: 12.4 seconds (ref 11.4–15.2)

## 2019-04-11 LAB — APTT: aPTT: 30 seconds (ref 24–36)

## 2019-04-11 NOTE — Telephone Encounter (Signed)
   Primary Cardiologist: Cristopher Peru, MD  Chart reviewed as part of pre-operative protocol coverage.   The patient last seen by Barrington Ellison 04/07/19 and cleared as "Pt is cleared for surgery and this should not interfere with her pacemaker.  If heart rate depression is noted (<50), a magnet can be placed on the device for the remainder of the procedure, which will cause her device to pace asynchronously (She will likely feel palpitations during this time and be uncomfortable if she is awake; she is very symptomatic during threshold testing)".  I will route this recommendation to the requesting party via Epic fax function and remove from pre-op pool.  Please call with questions.  [place], [name] 04/11/2019, 9:41 AM

## 2019-04-11 NOTE — Telephone Encounter (Signed)
.  ° °  [facility] Pre-operative Risk Assessment    Request for surgical clearance:  1. What type of surgery is being performed? Revision -knee  2. When is this surgery scheduled? 04/13/19  3. What type of clearance is required (medical clearance vs. Pharmacy clearance to hold med vs. Both)? both  4. Are there any medications that need to be held prior to surgery and how long?  5. Practice name and name of physician performing surgery? [facility], Dr Frederik Pear   6. What is your office phone number [phone]   7.   What is your office fax number [phone] attn Wells Guiles L.  8.   Anesthesia type (None, local, MAC, general) ? SPINAL   Laurier Nancy 04/11/2019, 9:11 AM  _________________________________________________________________   (provider comments below)

## 2019-04-12 ENCOUNTER — Other Ambulatory Visit: Payer: Self-pay | Admitting: *Deleted

## 2019-04-12 DIAGNOSIS — T8484XA Pain due to internal orthopedic prosthetic devices, implants and grafts, initial encounter: Secondary | ICD-10-CM | POA: Diagnosis present

## 2019-04-12 DIAGNOSIS — Z96651 Presence of right artificial knee joint: Secondary | ICD-10-CM | POA: Diagnosis present

## 2019-04-12 MED ORDER — TRANEXAMIC ACID 1000 MG/10ML IV SOLN
2000.0000 mg | INTRAVENOUS | Status: DC
  Filled 2019-04-12: qty 20

## 2019-04-12 MED ORDER — BUPIVACAINE LIPOSOME 1.3 % IJ SUSP
20.0000 mL | Freq: Once | INTRAMUSCULAR | Status: DC
  Filled 2019-04-12: qty 20

## 2019-04-12 NOTE — Anesthesia Preprocedure Evaluation (Addendum)
Anesthesia Evaluation  Patient identified by MRN, date of birth, ID band Patient awake    Reviewed: Allergy & Precautions, NPO status , Patient's Chart, lab work & pertinent test results  History of Anesthesia Complications (+) PONVNegative for: history of anesthetic complications  Airway Mallampati: II  TM Distance: >3 FB Neck ROM: Full    Dental   Pulmonary neg pulmonary ROS,    Pulmonary exam normal        Cardiovascular + DVT  Normal cardiovascular exam+ pacemaker ([facility])  Rhythm:Regular Rate:Normal     Neuro/Psych negative neurological ROS  negative psych ROS   GI/Hepatic negative GI ROS, Neg liver ROS,   Endo/Other  negative endocrine ROS  Renal/GU negative Renal ROS  negative genitourinary   Musculoskeletal negative musculoskeletal ROS (+)   Abdominal   Peds  Hematology negative hematology ROS (+)   Anesthesia Other Findings Pt last seen by cardiology 04/11/2019.  Seen by Liberty Handy, PA-C.  Per OV note, "Pt is cleared for surgery and this should not interfere with her pacemaker.  If heart rate depression is noted (<50), a magnet can be placed on the device for the remainder of the procedure, which will cause her device to pace asynchronously (She will likely feel palpitations during this time and be uncomfortable if she is awake; she is very symptomatic during threshold testing)."  Reproductive/Obstetrics                            Anesthesia Physical Anesthesia Plan  ASA: II  Anesthesia Plan: Spinal   Post-op Pain Management:    Induction:   PONV Risk Score and Plan: 3 and Propofol infusion, Treatment may vary due to age or medical condition, Ondansetron and TIVA  Airway Management Planned: Nasal Cannula and Simple Face Mask  Additional Equipment: None  Intra-op Plan:   Post-operative Plan:   Informed Consent: I have reviewed the patients History and Physical, chart,  labs and discussed the procedure including the risks, benefits and alternatives for the proposed anesthesia with the patient or authorized representative who has indicated his/her understanding and acceptance.       Plan Discussed with:   Anesthesia Plan Comments: (See PAT note 04/11/2019, Konrad Felix, PA-C)      Anesthesia Quick Evaluation

## 2019-04-12 NOTE — Patient Outreach (Signed)
Kari [name]) Care Management  04/12/2019  Kari [name] 01-May-1951 [id number]   Preoperative Screening Call/Transition of Care Referral received: 04/07/19 Surgery/procedure date: 04/13/19 Insurance: [facility] Choice Plan  Subjective:  Initial successful telephone call to patient's preferred number in order to complete preoperative screening. 2 HIPAA identifiers verified. Discussed purpose of preoperative call. Patient voices understanding and agrees to call. She states she understands the reason for the surgery and the expected time of arrival. She says she completed her preadmission testing on 04/11/19 and has no additional questions. She says she expects to be in the hospital 2-3 days. She states she did purchase the hospital indemnity insurance and is aware she will have to file the claim after her surgery. She says she will have 24/7 care at home to assist in her recovery. She does not have and is not interested in completing advanced directives at this time. 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 She agrees to a post hospital discharge transition of care call.    Objective:  Per chart review, patient is scheduled for revision of right knee arthroplasty on 04/13/19 at [facility]. 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   Plan: RNCM will call patient for post hospital transition of care outreach within 72 hours of hospital discharge notification.  Barrington Ellison RN,CCM,CDE [place] [facility]  Coordinator Office Phone [phone] Office Fax [phone]

## 2019-04-12 NOTE — H&P (Signed)
TOTAL KNEE REVISION ADMISSION H&P  Patient is being admitted for right revision total knee arthroplasty.  Subjective:  Chief Complaint:right knee pain.  HPI: [place], 68 y.o. female, has a history of pain and functional disability in the right knee(s) due to failed previous arthroplasty and patient has failed non-surgical conservative treatments for greater than 12 weeks to include NSAID's and/or analgesics, flexibility and strengthening excercises and activity modification. The indications for the revision of the total knee arthroplasty are progressive or substantial perporsthetic bone loss. Onset of symptoms was gradual starting 1 years ago with rapidlly worsening course since that time.  Prior procedures on the right knee(s) include arthroplasty.  Patient currently rates pain in the right knee(s) at 10 out of 10 with activity. There is night pain, worsening of pain with activity and weight bearing, pain that interferes with activities of daily living, pain with passive range of motion and joint swelling.  Patient has evidence of subchondral cysts by imaging studies. This condition presents safety issues increasing the risk of falls.   There is no current active infection.  Patient Active Problem List   Diagnosis Date Noted  . Near syncope 03/11/2013  . Bradycardia 02/21/2013  . S/P cardiac pacemaker procedure 02/20/2013  . Chest tightness 02/19/2013  . Palpitations 02/19/2013  . 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  . Diverticulitis 03/03/2012  . Chronic low back pain 08/28/2011  . GERD (gastroesophageal reflux disease)   . Herpes zoster conjunctivitis   . Menopause   . Arthritis   . Diverticulosis   . Allergy    Past Medical History:  Diagnosis Date  . Arthritis   . Chronic back pain   . Diverticulitis   . DVT (deep venous thrombosis) [facility]) age 7   s/p  knee arthroscopy   . Herpes zoster conjunctivitis   . Menopause   . Mobitz type 2 second degree AV  block    2:1/notes 02/16/2013  . PONV (postoperative nausea and vomiting)   . Presence of permanent cardiac pacemaker   . Seasonal allergies    "spring & fall; not q year" (02/17/2013)  . Shoulder impingement    and left rotator cuff tear    Past Surgical History:  Procedure Laterality Date  . ACHILLES TENDON SURGERY    . ARTHROGRAM KNEE Right 1975  . CHOLECYSTECTOMY  1990's  . colonscopy    . ESOPHAGOGASTRODUODENOSCOPY    . EYE SURGERY     muscle release right eye  . INSERT / REPLACE / REMOVE PACEMAKER  02/17/2013   [employer] Advantio dual-chamber pacemaker, model [id number],  . KNEE ARTHROSCOPY Right 1982, 1983, 1995, 1996  . KNEE ARTHROSCOPY W/ ACL RECONSTRUCTION Left 1984  . LAPAROSCOPIC SIGMOID COLECTOMY N/A 01/01/2015   Procedure: LAPAROSCOPIC SIGMOID COLECTOMY;  Surgeon: Stark Klein, MD;  Location: [place];  Service: General;  Laterality: N/A;  . PERMANENT PACEMAKER INSERTION N/A 02/17/2013   Procedure: PERMANENT PACEMAKER INSERTION;  Surgeon: Evans Lance, MD;  Location: [facility] CATH LAB;  Service: Cardiovascular;  Laterality: N/A;  . SHOULDER ARTHROSCOPY Left 09/05/2016   Procedure: LEFT SHOULDER ARTHROSCOPY , ACROMIOPLASTY AND ROTATOR CUFF REPAIR;  Surgeon: Melrose Nakayama, MD;  Location: [place];  Service: Orthopedics;  Laterality: Left;  . TONSILLECTOMY  1961  . TOTAL KNEE ARTHROPLASTY Right 2000; 06/2000   "replaced w/appropriate hardware" (02/17/2013)  . TRIGGER FINGER RELEASE Right ~ 2012   "thumb" (02/17/2013)  . VAGINAL HYSTERECTOMY  ~ 1994   partial     Current Facility-Administered  Medications  Medication Dose Route Frequency Provider Last Rate Last Dose  . [START ON 04/13/2019] bupivacaine liposome (EXPAREL) 1.3 % injection 266 mg  20 mL Other Once Frederik Pear, MD      . Derrill Memo ON 04/13/2019] tranexamic acid (CYKLOKAPRON) 2,000 mg in sodium chloride 0.9 % 50 mL Topical Application  6,295 mg Topical To OR Frederik Pear, MD       Current Outpatient Medications   Medication Sig Dispense Refill Last Dose  . acetaminophen (TYLENOL) 500 MG tablet Take 1,000 mg by mouth every 6 (six) hours as needed (for pain).     Marland Kitchen aspirin EC 81 MG tablet Take 81 mg by mouth daily.     . Calcium Carb-Cholecalciferol (CALCIUM 600+D3 PO) Take 1 tablet by mouth daily.     . ciprofloxacin (CIPRO) 500 MG tablet Take 1 tablet (500 mg total) by mouth every 12 (twelve) hours. 20 tablet 0   . Cyanocobalamin (VITAMIN B-12) 2500 MCG SUBL Place 2,500 mcg under the tongue daily.      . diclofenac sodium (VOLTAREN) 1 % GEL Apply 1-2 g topically 4 (four) times daily as needed (knee pain.).     Marland Kitchen esomeprazole (NEXIUM) 40 MG capsule Take 40 mg by mouth daily.   9   . fluticasone (FLONASE) 50 MCG/ACT nasal spray Place 1 spray into both nostrils daily as needed for allergies.      . Melatonin 3 MG TABS Take 6 mg by mouth at bedtime as needed (sleep.).     Marland Kitchen Multiple Vitamin (MULTIVITAMIN WITH MINERALS) TABS Take 1 tablet by mouth daily.     . Omega-3 Fatty Acids (FISH OIL ULTRA) 1400 MG CAPS Take 1,400 mg by mouth daily.     . Probiotic Product (PROBIOTIC FORMULA PO) Take 1 capsule by mouth daily.      . valACYclovir (VALTREX) 1000 MG tablet Take 1,000 mg by mouth daily as needed (herpes zoster).       Allergies  Allergen Reactions  . Ceftin Itching    Tolerates amoxicillin  . Vancomycin Hives  . Bactrim [Sulfamethoxazole-Trimethoprim] Hives  . Levofloxacin Other (See Comments)    "spacey," tolerates Cipro    Social History   Tobacco Use  . Smoking status: Never Smoker  . Smokeless tobacco: Never Used  Substance Use Topics  . Alcohol use: Yes    Comment: occasional wine     Family History  Problem Relation Age of Onset  . Hypertension Mother   . Hyperlipidemia Mother   . Macular degeneration Mother   . COPD Mother   . Lung cancer Father   . Cancer Father        Lung  . COPD Father   . Diabetes Brother   . Breast cancer Paternal Aunt       Review of Systems   Constitutional: Negative.   HENT: Negative.   Eyes: Negative.   Respiratory: Negative.   Cardiovascular:       Irregular heart beat  Gastrointestinal: Negative.   Genitourinary: Negative.   Musculoskeletal: Positive for joint pain.  Neurological: Negative.   Endo/Heme/Allergies: Negative.   Psychiatric/Behavioral: Negative.      Objective:  Physical Exam  Constitutional: She is oriented to person, place, and time. She appears well-developed and well-nourished.  HENT:  Head: Normocephalic and atraumatic.  Eyes: Pupils are equal, round, and reactive to light.  Neck: Normal range of motion. Neck supple.  Cardiovascular: Intact distal pulses.  Respiratory: Effort normal.  Musculoskeletal:  General: Tenderness present.     Comments: Today the patient's knee has a 1+ effusion collateral ligaments are stable no pain with varus or valgus stress but as before, if he attempts to hyperextend the knee that does cause significant pain in the supracondylar region of the femur.  Neurological: She is alert and oriented to person, place, and time.  Skin: Skin is warm and dry.  Psychiatric: She has a normal mood and affect. Her behavior is normal. Judgment and thought content normal.    Vital signs in last 24 hours: Temp:  [98.1 F (36.7 C)] 98.1 F (36.7 C) (08/17 1524) Pulse Rate:  [69] 69 (08/17 1524) Resp:  [16] 16 (08/17 1524) BP: (138)/(80) 138/80 (08/17 1524) SpO2:  [96 %] 96 % (08/17 1524) Weight:  [74.8 kg] 74.8 kg (08/17 1524)  Labs:  Estimated body mass index is 25.09 kg/m as calculated from the following:   Height as of 04/11/19: 5\' 8"  (1.727 m).   Weight as of 04/11/19: 74.8 kg.  Imaging Review Plain radiographs comparing the lateral view of the femur this year with a lateral view done in the year 2010 and there is a cyst in the distal femur that may be consistent with early loosening or an impending stress fracture.    Assessment/Plan:  End stage arthritis,  right knee(s) with failed previous arthroplasty.   The patient history, physical examination, clinical judgment of the provider and imaging studies are consistent with end stage degenerative joint disease of the right knee(s), previous total knee arthroplasty. Revision total knee arthroplasty is deemed medically necessary. The treatment options including medical management, injection therapy, arthroscopy and revision arthroplasty were discussed at length. The risks and benefits of revision total knee arthroplasty were presented and reviewed. The risks due to aseptic loosening, infection, stiffness, patella tracking problems, thromboembolic complications and other imponderables were discussed. The patient acknowledged the explanation, agreed to proceed with the plan and consent was signed. Patient is being admitted for inpatient treatment for surgery, pain control, PT, OT, prophylactic antibiotics, VTE prophylaxis, progressive ambulation and ADL's and discharge planning.The patient is planning to be discharged home with home health services

## 2019-04-12 NOTE — Progress Notes (Signed)
Anesthesia Chart Review   Case: [id number] Date/Time: 04/13/19 0815   Procedure: Revision Right Knee Arthroplasty (Right Knee)   Anesthesia type: Spinal   Pre-op diagnosis: Loose Right Knee Replacement   Location: [facility] ROOM 08 / [facility]   Surgeon: Frederik Pear, MD      DISCUSSION:68 y.o. 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 Frederik Pear.   Pt last seen by cardiology 04/11/2019.  Seen by Liberty Handy, PA-C.  Per OV note, "Pt is cleared for surgery and this should not interfere with her pacemaker.  If heart rate depression is noted (<50), a magnet can be placed on the device for the remainder of the procedure, which will cause her device to pace asynchronously (She will likely feel palpitations during this time and be uncomfortable if she is awake; she is very symptomatic during threshold testing)."  Anticipate pt can proceed with planned procedure barring acute status change.   VS: BP 138/80   Pulse 69   Temp 36.7 C (Oral)   Resp 16   Ht 5\' 8"  (1.727 m)   Wt 74.8 kg   SpO2 96%   BMI 25.09 kg/m   PROVIDERS: Shelda Pal, DO is PCP   Cristopher Peru, MD is Electrophysiologist  LABS: Labs reviewed: Acceptable for surgery. (all labs ordered are listed, but only abnormal results are displayed)  Labs Reviewed  BASIC METABOLIC PANEL - Abnormal; Notable for the following components:      Result Value   Glucose, Bld 106 (*)    All other components within normal limits  SURGICAL PCR SCREEN  APTT  CBC WITH DIFFERENTIAL/PLATELET  PROTIME-INR  URINALYSIS, ROUTINE W REFLEX MICROSCOPIC  TYPE AND SCREEN  ABO/RH     IMAGES: Chest Xray 04/11/2019 FINDINGS: Cardiac shadow is within normal limits. Pacing device is again seen and stable. The lungs are well aerated without focal infiltrate or sizable effusion. No bony abnormality is  noted.  IMPRESSION: No active cardiopulmonary disease.   EKG: 04/07/2019 Rate 69 bpm Electronic ventricular pacemaker  CV: Echo 04/17/16 Study Conclusions  - Left ventricle: The cavity size was normal. Wall thickness was   normal. Systolic function was normal. The estimated ejection   fraction was in the range of 55% to 60%. Wall motion was normal;   there were no regional wall motion abnormalities. Doppler   parameters are consistent with abnormal left ventricular   relaxation (grade 1 diastolic dysfunction). - Aortic valve: There was no stenosis. - Right ventricle: The cavity size was normal. Pacer wire or   catheter noted in right ventricle. Systolic function was normal. - Tricuspid valve: Peak RV-RA gradient (S): 19 mm Hg. - Pulmonary arteries: PA peak pressure: 22 mm Hg (S). - Inferior vena cava: The vessel was normal in size. The   respirophasic diameter changes were in the normal range (= 50%),   consistent with normal central venous pressure. - Impressions: Normal LV size with EF 55-60%. Normal RV size and   systolic function. No significant valvular abnormalities.  Impressions:  - Normal LV size with EF 55-60%. Normal RV size and systolic   function. No significant valvular abnormalities. Past Medical History:  Diagnosis Date  . Arthritis   . Chronic back pain   . Diverticulitis   . DVT (deep venous thrombosis) [facility]) age 64   s/p  knee arthroscopy   . Herpes zoster  conjunctivitis   . Menopause   . Mobitz type 2 second degree AV block    2:1/notes 02/16/2013  . PONV (postoperative nausea and vomiting)   . Presence of permanent cardiac pacemaker   . Seasonal allergies    "spring & fall; not q year" (02/17/2013)  . Shoulder impingement    and left rotator cuff tear    Past Surgical History:  Procedure Laterality Date  . ACHILLES TENDON SURGERY    . ARTHROGRAM KNEE Right 1975  . CHOLECYSTECTOMY  1990's  . colonscopy    . ESOPHAGOGASTRODUODENOSCOPY     . EYE SURGERY     muscle release right eye  . INSERT / REPLACE / REMOVE PACEMAKER  02/17/2013   [employer] Advantio dual-chamber pacemaker, model [id number],  . KNEE ARTHROSCOPY Right 1982, 1983, 1995, 1996  . KNEE ARTHROSCOPY W/ ACL RECONSTRUCTION Left 1984  . LAPAROSCOPIC SIGMOID COLECTOMY N/A 01/01/2015   Procedure: LAPAROSCOPIC SIGMOID COLECTOMY;  Surgeon: Stark Klein, MD;  Location: [place];  Service: General;  Laterality: N/A;  . PERMANENT PACEMAKER INSERTION N/A 02/17/2013   Procedure: PERMANENT PACEMAKER INSERTION;  Surgeon: Evans Lance, MD;  Location: [facility] CATH LAB;  Service: Cardiovascular;  Laterality: N/A;  . SHOULDER ARTHROSCOPY Left 09/05/2016   Procedure: LEFT SHOULDER ARTHROSCOPY , ACROMIOPLASTY AND ROTATOR CUFF REPAIR;  Surgeon: Melrose Nakayama, MD;  Location: [place];  Service: Orthopedics;  Laterality: Left;  . TONSILLECTOMY  1961  . TOTAL KNEE ARTHROPLASTY Right 2000; 06/2000   "replaced w/appropriate hardware" (02/17/2013)  . TRIGGER FINGER RELEASE Right ~ 2012   "thumb" (02/17/2013)  . VAGINAL HYSTERECTOMY  ~ 1994   partial     MEDICATIONS: . acetaminophen (TYLENOL) 500 MG tablet  . aspirin EC 81 MG tablet  . Calcium Carb-Cholecalciferol (CALCIUM 600+D3 PO)  . ciprofloxacin (CIPRO) 500 MG tablet  . Cyanocobalamin (VITAMIN B-12) 2500 MCG SUBL  . diclofenac sodium (VOLTAREN) 1 % GEL  . esomeprazole (NEXIUM) 40 MG capsule  . fluticasone (FLONASE) 50 MCG/ACT nasal spray  . Melatonin 3 MG TABS  . Multiple Vitamin (MULTIVITAMIN WITH MINERALS) TABS  . Omega-3 Fatty Acids (FISH OIL ULTRA) 1400 MG CAPS  . Probiotic Product (PROBIOTIC FORMULA PO)  . valACYclovir (VALTREX) 1000 MG tablet   No current facility-administered medications for this encounter.    Derrill Memo ON 04/13/2019] bupivacaine liposome (EXPAREL) 1.3 % injection 266 mg  . [START ON 04/13/2019] tranexamic acid (CYKLOKAPRON) 2,000 mg in sodium chloride 0.9 % 50 mL Topical Application    Maia Plan [facility] Pre-Surgical Testing [phone] 04/12/19 10:25 AM

## 2019-04-13 ENCOUNTER — Inpatient Hospital Stay (HOSPITAL_COMMUNITY)
Admission: RE | Admit: 2019-04-13 | Discharge: 2019-04-14 | DRG: 468 | Disposition: A | Discharge status: Home Health | Payer: 59 | Attending: Orthopedic Surgery | Admitting: Orthopedic Surgery

## 2019-04-13 ENCOUNTER — Other Ambulatory Visit: Payer: Self-pay

## 2019-04-13 ENCOUNTER — Encounter (HOSPITAL_COMMUNITY): Payer: Self-pay | Admitting: General Practice

## 2019-04-13 ENCOUNTER — Inpatient Hospital Stay (HOSPITAL_COMMUNITY): Payer: 59

## 2019-04-13 ENCOUNTER — Inpatient Hospital Stay (HOSPITAL_COMMUNITY): Payer: 59 | Admitting: Physician Assistant

## 2019-04-13 ENCOUNTER — Encounter (HOSPITAL_COMMUNITY)
Admission: RE | Disposition: A | Discharge status: Home / Self Care | Payer: Self-pay | Source: Home / Self Care | Attending: Orthopedic Surgery

## 2019-04-13 ENCOUNTER — Inpatient Hospital Stay (HOSPITAL_COMMUNITY): Payer: 59 | Admitting: Anesthesiology

## 2019-04-13 DIAGNOSIS — Z803 Family history of malignant neoplasm of breast: Secondary | ICD-10-CM

## 2019-04-13 DIAGNOSIS — T84032A Mechanical loosening of internal right knee prosthetic joint, initial encounter: Principal | ICD-10-CM | POA: Diagnosis present

## 2019-04-13 DIAGNOSIS — Z825 Family history of asthma and other chronic lower respiratory diseases: Secondary | ICD-10-CM | POA: Diagnosis not present

## 2019-04-13 DIAGNOSIS — Z471 Aftercare following joint replacement surgery: Secondary | ICD-10-CM | POA: Diagnosis not present

## 2019-04-13 DIAGNOSIS — K219 Gastro-esophageal reflux disease without esophagitis: Secondary | ICD-10-CM | POA: Diagnosis present

## 2019-04-13 DIAGNOSIS — Z90710 Acquired absence of both cervix and uterus: Secondary | ICD-10-CM | POA: Diagnosis not present

## 2019-04-13 DIAGNOSIS — Z8349 Family history of other endocrine, nutritional and metabolic diseases: Secondary | ICD-10-CM | POA: Diagnosis not present

## 2019-04-13 DIAGNOSIS — Z9181 History of falling: Secondary | ICD-10-CM | POA: Diagnosis not present

## 2019-04-13 DIAGNOSIS — Z950 Presence of cardiac pacemaker: Secondary | ICD-10-CM

## 2019-04-13 DIAGNOSIS — T8484XA Pain due to internal orthopedic prosthetic devices, implants and grafts, initial encounter: Secondary | ICD-10-CM | POA: Diagnosis present

## 2019-04-13 DIAGNOSIS — Z7982 Long term (current) use of aspirin: Secondary | ICD-10-CM | POA: Diagnosis not present

## 2019-04-13 DIAGNOSIS — Z8249 Family history of ischemic heart disease and other diseases of the circulatory system: Secondary | ICD-10-CM

## 2019-04-13 DIAGNOSIS — Z79891 Long term (current) use of opiate analgesic: Secondary | ICD-10-CM | POA: Diagnosis not present

## 2019-04-13 DIAGNOSIS — Z9049 Acquired absence of other specified parts of digestive tract: Secondary | ICD-10-CM

## 2019-04-13 DIAGNOSIS — Z96651 Presence of right artificial knee joint: Secondary | ICD-10-CM | POA: Diagnosis not present

## 2019-04-13 DIAGNOSIS — Z801 Family history of malignant neoplasm of trachea, bronchus and lung: Secondary | ICD-10-CM | POA: Diagnosis not present

## 2019-04-13 DIAGNOSIS — Z881 Allergy status to other antibiotic agents status: Secondary | ICD-10-CM

## 2019-04-13 DIAGNOSIS — H5711 Ocular pain, right eye: Secondary | ICD-10-CM | POA: Diagnosis present

## 2019-04-13 DIAGNOSIS — M1711 Unilateral primary osteoarthritis, right knee: Secondary | ICD-10-CM | POA: Diagnosis not present

## 2019-04-13 DIAGNOSIS — Z20828 Contact with and (suspected) exposure to other viral communicable diseases: Secondary | ICD-10-CM | POA: Diagnosis present

## 2019-04-13 DIAGNOSIS — G8918 Other acute postprocedural pain: Secondary | ICD-10-CM | POA: Diagnosis not present

## 2019-04-13 DIAGNOSIS — E11319 Type 2 diabetes mellitus with unspecified diabetic retinopathy without macular edema: Secondary | ICD-10-CM | POA: Diagnosis present

## 2019-04-13 DIAGNOSIS — K5790 Diverticulosis of intestine, part unspecified, without perforation or abscess without bleeding: Secondary | ICD-10-CM | POA: Diagnosis not present

## 2019-04-13 DIAGNOSIS — Z79899 Other long term (current) drug therapy: Secondary | ICD-10-CM | POA: Diagnosis not present

## 2019-04-13 DIAGNOSIS — G8929 Other chronic pain: Secondary | ICD-10-CM | POA: Diagnosis present

## 2019-04-13 HISTORY — PX: TOTAL KNEE REVISION: SHX996

## 2019-04-13 LAB — TYPE AND SCREEN
ABO/RH(D): A POS
Antibody Screen: NEGATIVE

## 2019-04-13 SURGERY — TOTAL KNEE REVISION
Anesthesia: Spinal | Site: Knee | Laterality: Right

## 2019-04-13 MED ORDER — FENTANYL CITRATE (PF) 100 MCG/2ML IJ SOLN
INTRAMUSCULAR | Status: AC
  Filled 2019-04-13: qty 2

## 2019-04-13 MED ORDER — VITAMIN B-12 2500 MCG SL SUBL: 2500.0000 ug | SUBLINGUAL_TABLET | Freq: Every day | SUBLINGUAL | Status: DC

## 2019-04-13 MED ORDER — BUPIVACAINE-EPINEPHRINE (PF) 0.5% -1:200000 IJ SOLN
INTRAMUSCULAR | Status: AC
  Filled 2019-04-13: qty 30

## 2019-04-13 MED ORDER — HYDROMORPHONE HCL 1 MG/ML IJ SOLN
0.5000 mg | INTRAMUSCULAR | Status: DC | PRN
  Administered 2019-04-13: 1 mg via INTRAVENOUS
  Filled 2019-04-13: qty 1

## 2019-04-13 MED ORDER — PANTOPRAZOLE SODIUM 40 MG PO TBEC: 40.0000 mg | DELAYED_RELEASE_TABLET | Freq: Every day | ORAL | Status: DC

## 2019-04-13 MED ORDER — METOCLOPRAMIDE HCL 5 MG PO TABS: 5.0000 mg | ORAL_TABLET | Freq: Three times a day (TID) | ORAL | Status: DC | PRN

## 2019-04-13 MED ORDER — CEFAZOLIN SODIUM-DEXTROSE 2-4 GM/100ML-% IV SOLN
2.0000 g | INTRAVENOUS | Status: AC
  Administered 2019-04-13: 2 g via INTRAVENOUS
  Filled 2019-04-13: qty 100

## 2019-04-13 MED ORDER — PHENYLEPHRINE 40 MCG/ML (10ML) SYRINGE FOR IV PUSH (FOR BLOOD PRESSURE SUPPORT)
PREFILLED_SYRINGE | INTRAVENOUS | Status: DC | PRN
  Administered 2019-04-13 (×2): 80 ug via INTRAVENOUS

## 2019-04-13 MED ORDER — ROPIVACAINE HCL 5 MG/ML IJ SOLN
INTRAMUSCULAR | Status: DC | PRN
  Administered 2019-04-13: 30 mL via PERINEURAL

## 2019-04-13 MED ORDER — VITAMIN C 500 MG PO TABS
250.0000 mg | ORAL_TABLET | Freq: Every day | ORAL | Status: DC
  Administered 2019-04-14: 250 mg via ORAL
  Filled 2019-04-13: qty 1

## 2019-04-13 MED ORDER — MIDAZOLAM HCL 2 MG/2ML IJ SOLN
INTRAMUSCULAR | Status: AC
  Filled 2019-04-13: qty 2

## 2019-04-13 MED ORDER — FENTANYL CITRATE (PF) 100 MCG/2ML IJ SOLN
25.0000 ug | INTRAMUSCULAR | Status: DC | PRN
  Administered 2019-04-13 (×2): 50 ug via INTRAVENOUS

## 2019-04-13 MED ORDER — FENTANYL CITRATE (PF) 100 MCG/2ML IJ SOLN
INTRAMUSCULAR | Status: DC | PRN
  Administered 2019-04-13 (×2): 100 ug via INTRAVENOUS

## 2019-04-13 MED ORDER — ONDANSETRON HCL 4 MG/2ML IJ SOLN: 4.0000 mg | Freq: Once | INTRAMUSCULAR | Status: DC | PRN

## 2019-04-13 MED ORDER — MENTHOL 3 MG MT LOZG: 1.0000 | LOZENGE | OROMUCOSAL | Status: DC | PRN

## 2019-04-13 MED ORDER — FLEET ENEMA 7-19 GM/118ML RE ENEM: 1.0000 | ENEMA | Freq: Once | RECTAL | Status: DC | PRN

## 2019-04-13 MED ORDER — ZOLPIDEM TARTRATE 5 MG PO TABS: 5.0000 mg | ORAL_TABLET | Freq: Every evening | ORAL | Status: DC | PRN

## 2019-04-13 MED ORDER — PHENYLEPHRINE 40 MCG/ML (10ML) SYRINGE FOR IV PUSH (FOR BLOOD PRESSURE SUPPORT)
PREFILLED_SYRINGE | INTRAVENOUS | Status: AC
  Filled 2019-04-13: qty 10

## 2019-04-13 MED ORDER — BSS IO SOLN
15.0000 mL | Freq: Once | INTRAOCULAR | Status: AC
  Administered 2019-04-13: 15 mL
  Filled 2019-04-13: qty 15

## 2019-04-13 MED ORDER — PROPOFOL 10 MG/ML IV BOLUS
INTRAVENOUS | Status: AC
  Filled 2019-04-13: qty 60

## 2019-04-13 MED ORDER — ONDANSETRON HCL 4 MG/2ML IJ SOLN
4.0000 mg | Freq: Four times a day (QID) | INTRAMUSCULAR | Status: DC | PRN
  Administered 2019-04-13: 4 mg via INTRAVENOUS
  Filled 2019-04-13: qty 2

## 2019-04-13 MED ORDER — ONDANSETRON HCL 4 MG PO TABS: 4.0000 mg | ORAL_TABLET | Freq: Four times a day (QID) | ORAL | Status: DC | PRN

## 2019-04-13 MED ORDER — SODIUM CHLORIDE (PF) 0.9 % IJ SOLN
INTRAMUSCULAR | Status: DC | PRN
  Administered 2019-04-13: 50 mL

## 2019-04-13 MED ORDER — ALUM & MAG HYDROXIDE-SIMETH 200-200-20 MG/5ML PO SUSP: 30.0000 mL | ORAL | Status: DC | PRN

## 2019-04-13 MED ORDER — KCL IN DEXTROSE-NACL 20-5-0.45 MEQ/L-%-% IV SOLN
INTRAVENOUS | Status: DC
  Administered 2019-04-13: 20:00:00 via INTRAVENOUS
  Filled 2019-04-13 (×2): qty 1000

## 2019-04-13 MED ORDER — METOCLOPRAMIDE HCL 5 MG/ML IJ SOLN: 5.0000 mg | Freq: Three times a day (TID) | INTRAMUSCULAR | Status: DC | PRN

## 2019-04-13 MED ORDER — PANTOPRAZOLE SODIUM 40 MG PO TBEC: 80.0000 mg | DELAYED_RELEASE_TABLET | Freq: Every day | ORAL | Status: DC

## 2019-04-13 MED ORDER — OXYCODONE HCL 5 MG PO TABS: 5.0000 mg | ORAL_TABLET | Freq: Once | ORAL | Status: DC | PRN

## 2019-04-13 MED ORDER — ACETAMINOPHEN 325 MG PO TABS
325.0000 mg | ORAL_TABLET | Freq: Four times a day (QID) | ORAL | Status: DC | PRN
  Administered 2019-04-14: 650 mg via ORAL
  Filled 2019-04-13: qty 2

## 2019-04-13 MED ORDER — MIDAZOLAM HCL 5 MG/5ML IJ SOLN
INTRAMUSCULAR | Status: DC | PRN
  Administered 2019-04-13 (×2): 2 mg via INTRAVENOUS

## 2019-04-13 MED ORDER — PROPOFOL 500 MG/50ML IV EMUL
INTRAVENOUS | Status: DC | PRN
  Administered 2019-04-13: 75 ug/kg/min via INTRAVENOUS

## 2019-04-13 MED ORDER — ONDANSETRON HCL 4 MG/2ML IJ SOLN
INTRAMUSCULAR | Status: DC | PRN
  Administered 2019-04-13: 4 mg via INTRAVENOUS

## 2019-04-13 MED ORDER — KETOROLAC TROMETHAMINE 0.5 % OP SOLN
1.0000 [drp] | Freq: Three times a day (TID) | OPHTHALMIC | Status: DC | PRN
  Administered 2019-04-13: 1 [drp] via OPHTHALMIC
  Filled 2019-04-13: qty 5

## 2019-04-13 MED ORDER — VITAMIN B-12 1000 MCG PO TABS
2500.0000 ug | ORAL_TABLET | Freq: Every day | ORAL | Status: DC
  Administered 2019-04-14: 2500 ug via ORAL
  Filled 2019-04-13: qty 3

## 2019-04-13 MED ORDER — VALACYCLOVIR HCL 500 MG PO TABS
1000.0000 mg | ORAL_TABLET | Freq: Every day | ORAL | Status: DC | PRN
  Filled 2019-04-13: qty 2

## 2019-04-13 MED ORDER — SODIUM CHLORIDE 0.9 % IR SOLN
Status: DC | PRN
  Administered 2019-04-13: 1000 mL

## 2019-04-13 MED ORDER — SODIUM CHLORIDE 0.9 % IR SOLN
Status: DC | PRN
  Administered 2019-04-13: 3000 mL

## 2019-04-13 MED ORDER — OXYCODONE HCL 5 MG/5ML PO SOLN: 5.0000 mg | Freq: Once | ORAL | Status: DC | PRN

## 2019-04-13 MED ORDER — DOCUSATE SODIUM 100 MG PO CAPS
100.0000 mg | ORAL_CAPSULE | Freq: Two times a day (BID) | ORAL | Status: DC
  Administered 2019-04-13: 100 mg via ORAL
  Filled 2019-04-13 (×2): qty 1

## 2019-04-13 MED ORDER — FLUTICASONE PROPIONATE 50 MCG/ACT NA SUSP
1.0000 | Freq: Every day | NASAL | Status: DC | PRN
  Filled 2019-04-13: qty 16

## 2019-04-13 MED ORDER — TIZANIDINE HCL 2 MG PO TABS: 2.0000 mg | ORAL_TABLET | Freq: Four times a day (QID) | ORAL | 0 refills | Status: DC | PRN

## 2019-04-13 MED ORDER — METHOCARBAMOL 500 MG PO TABS
500.0000 mg | ORAL_TABLET | Freq: Four times a day (QID) | ORAL | Status: DC | PRN
  Administered 2019-04-13 – 2019-04-14 (×3): 500 mg via ORAL
  Filled 2019-04-13 (×3): qty 1

## 2019-04-13 MED ORDER — PROPOFOL 10 MG/ML IV BOLUS
INTRAVENOUS | Status: AC
  Filled 2019-04-13: qty 40

## 2019-04-13 MED ORDER — ASPIRIN EC 81 MG PO TBEC: 81.0000 mg | DELAYED_RELEASE_TABLET | Freq: Two times a day (BID) | ORAL | 0 refills | Status: DC

## 2019-04-13 MED ORDER — BUPIVACAINE LIPOSOME 1.3 % IJ SUSP
INTRAMUSCULAR | Status: DC | PRN
  Administered 2019-04-13: 20 mL

## 2019-04-13 MED ORDER — DIPHENHYDRAMINE HCL 12.5 MG/5ML PO ELIX: 12.5000 mg | ORAL_SOLUTION | ORAL | Status: DC | PRN

## 2019-04-13 MED ORDER — GABAPENTIN 300 MG PO CAPS
300.0000 mg | ORAL_CAPSULE | Freq: Three times a day (TID) | ORAL | Status: DC
  Administered 2019-04-13 – 2019-04-14 (×4): 300 mg via ORAL
  Filled 2019-04-13 (×4): qty 1

## 2019-04-13 MED ORDER — SODIUM CHLORIDE 0.9 % IV SOLN
INTRAVENOUS | Status: DC | PRN
  Administered 2019-04-13: 50 ug/min via INTRAVENOUS

## 2019-04-13 MED ORDER — POLYETHYLENE GLYCOL 3350 17 G PO PACK: 17.0000 g | PACK | Freq: Every day | ORAL | Status: DC | PRN

## 2019-04-13 MED ORDER — LACTATED RINGERS IV SOLN
INTRAVENOUS | Status: DC
  Administered 2019-04-13 (×2): via INTRAVENOUS

## 2019-04-13 MED ORDER — PHENOL 1.4 % MT LIQD: 1.0000 | OROMUCOSAL | Status: DC | PRN

## 2019-04-13 MED ORDER — DEXAMETHASONE SODIUM PHOSPHATE 10 MG/ML IJ SOLN
INTRAMUSCULAR | Status: DC | PRN
  Administered 2019-04-13: 10 mg via INTRAVENOUS

## 2019-04-13 MED ORDER — POVIDONE-IODINE 10 % EX SWAB: 2.0000 "application " | Freq: Once | CUTANEOUS | Status: DC

## 2019-04-13 MED ORDER — TRANEXAMIC ACID-NACL 1000-0.7 MG/100ML-% IV SOLN
1000.0000 mg | INTRAVENOUS | Status: AC
  Administered 2019-04-13: 1000 mg via INTRAVENOUS
  Filled 2019-04-13: qty 100

## 2019-04-13 MED ORDER — PHENYLEPHRINE HCL (PRESSORS) 10 MG/ML IV SOLN
INTRAVENOUS | Status: AC
  Filled 2019-04-13: qty 1

## 2019-04-13 MED ORDER — BUPIVACAINE IN DEXTROSE 0.75-8.25 % IT SOLN
INTRATHECAL | Status: DC | PRN
  Administered 2019-04-13: 2 mL via INTRATHECAL

## 2019-04-13 MED ORDER — TRANEXAMIC ACID-NACL 1000-0.7 MG/100ML-% IV SOLN
1000.0000 mg | Freq: Once | INTRAVENOUS | Status: AC
  Administered 2019-04-13: 17:00:00 1000 mg via INTRAVENOUS
  Filled 2019-04-13: qty 100

## 2019-04-13 MED ORDER — VITAMIN C 100 MG PO TABS: 200.0000 mg | ORAL_TABLET | Freq: Every day | ORAL | Status: DC

## 2019-04-13 MED ORDER — PROPOFOL 10 MG/ML IV BOLUS
INTRAVENOUS | Status: DC | PRN
  Administered 2019-04-13: 40 mg via INTRAVENOUS

## 2019-04-13 MED ORDER — CELECOXIB 200 MG PO CAPS
200.0000 mg | ORAL_CAPSULE | Freq: Two times a day (BID) | ORAL | Status: DC
  Administered 2019-04-14: 200 mg via ORAL
  Filled 2019-04-13: qty 1

## 2019-04-13 MED ORDER — VALACYCLOVIR HCL 500 MG PO TABS: 1000.0000 mg | ORAL_TABLET | Freq: Every day | ORAL | Status: DC | PRN

## 2019-04-13 MED ORDER — SODIUM CHLORIDE (PF) 0.9 % IJ SOLN
INTRAMUSCULAR | Status: AC
  Filled 2019-04-13: qty 50

## 2019-04-13 MED ORDER — BISACODYL 5 MG PO TBEC: 5.0000 mg | DELAYED_RELEASE_TABLET | Freq: Every day | ORAL | Status: DC | PRN

## 2019-04-13 MED ORDER — BUPIVACAINE-EPINEPHRINE 0.5% -1:200000 IJ SOLN
INTRAMUSCULAR | Status: DC | PRN
  Administered 2019-04-13: 30 mL

## 2019-04-13 MED ORDER — METHOCARBAMOL 500 MG IVPB - SIMPLE MED
500.0000 mg | Freq: Four times a day (QID) | INTRAVENOUS | Status: DC | PRN
  Filled 2019-04-13: qty 50

## 2019-04-13 MED ORDER — APIXABAN 2.5 MG PO TABS
2.5000 mg | ORAL_TABLET | Freq: Two times a day (BID) | ORAL | Status: DC
  Administered 2019-04-14: 2.5 mg via ORAL
  Filled 2019-04-13 (×3): qty 1

## 2019-04-13 MED ORDER — OXYCODONE HCL 5 MG PO TABS
5.0000 mg | ORAL_TABLET | ORAL | Status: DC | PRN
  Administered 2019-04-13 – 2019-04-14 (×5): 10 mg via ORAL
  Filled 2019-04-13 (×5): qty 2

## 2019-04-13 MED ORDER — CALCIUM CARBONATE-VITAMIN D 500-200 MG-UNIT PO TABS
1.0000 | ORAL_TABLET | Freq: Every day | ORAL | Status: DC
  Administered 2019-04-14: 1 via ORAL
  Filled 2019-04-13: qty 1

## 2019-04-13 MED ORDER — OXYCODONE-ACETAMINOPHEN 5-325 MG PO TABS: 1.0000 | ORAL_TABLET | ORAL | 0 refills | Status: DC | PRN

## 2019-04-13 MED ORDER — CHLORHEXIDINE GLUCONATE 4 % EX LIQD: 60.0000 mL | Freq: Once | CUTANEOUS | Status: DC

## 2019-04-13 MED ORDER — ERYTHROMYCIN 5 MG/GM OP OINT
TOPICAL_OINTMENT | Freq: Three times a day (TID) | OPHTHALMIC | Status: DC
  Filled 2019-04-13: qty 1

## 2019-04-13 MED ORDER — CALCIUM CARB-CHOLECALCIFEROL 600-200 MG-UNIT PO TABS: ORAL_TABLET | Freq: Every day | ORAL | Status: DC

## 2019-04-13 MED ORDER — SODIUM CHLORIDE (PF) 0.9 % IJ SOLN
INTRAMUSCULAR | Status: AC
  Filled 2019-04-13: qty 20

## 2019-04-13 SURGICAL SUPPLY — 74 items
ADAPTER BOLT FEMORAL +2/-2 (Knees) ×2 IMPLANT
ADPR FEM +2/-2 OFST BOLT (Knees) ×1 IMPLANT
ADPR FEM 5D STRL KN PFC SGM (Orthopedic Implant) ×1 IMPLANT
AUG FEM SZ3 4 CMB POST STRL LF (Knees) ×1 IMPLANT
BAG DECANTER FOR FLEXI CONT (MISCELLANEOUS) ×3 IMPLANT
BAG SPEC THK2 15X12 ZIP CLS (MISCELLANEOUS) ×1
BAG ZIPLOCK 12X15 (MISCELLANEOUS) ×3 IMPLANT
BIT DRILL 2.8X128 (BIT) ×1 IMPLANT
BIT DRILL 2.8X128MM (BIT) ×1
BLADE SAG 18X100X1.27 (BLADE) ×3 IMPLANT
BLADE SAW SAG 90X13X1.27 (BLADE) ×3 IMPLANT
BLADE SAW SGTL 81X20 HD (BLADE) ×3 IMPLANT
BLADE SURG SZ10 CARB STEEL (BLADE) ×6 IMPLANT
BNDG CMPR MED 10X6 ELC LF (GAUZE/BANDAGES/DRESSINGS) ×1
BNDG ELASTIC 6X10 VLCR STRL LF (GAUZE/BANDAGES/DRESSINGS) ×4 IMPLANT
BNDG ELASTIC 6X5.8 VLCR STR LF (GAUZE/BANDAGES/DRESSINGS) ×2 IMPLANT
BOWL SMART MIX CTS (DISPOSABLE) ×4 IMPLANT
BRUSH FEMORAL CANAL (MISCELLANEOUS) ×3 IMPLANT
BUR OVAL CARBIDE 4.0 (BURR) ×2 IMPLANT
BUR RND DIAM 4.0 (BURR) ×2 IMPLANT
CEMENT HV SMART SET (Cement) ×12 IMPLANT
COMP FEM CEM RT SZ3 (Orthopedic Implant) ×3 IMPLANT
COMPONENT FEM CEM RT SZ3 (Orthopedic Implant) IMPLANT
COVER SURGICAL LIGHT HANDLE (MISCELLANEOUS) ×3 IMPLANT
COVER WAND RF STERILE (DRAPES) IMPLANT
CUFF TOURN SGL QUICK 34 (TOURNIQUET CUFF) ×3
CUFF TRNQT CYL 34X4.125X (TOURNIQUET CUFF) ×1 IMPLANT
DECANTER SPIKE VIAL GLASS SM (MISCELLANEOUS) ×4 IMPLANT
DRAPE ORTHO SPLIT 77X108 STRL (DRAPES) ×3
DRAPE SURG ORHT 6 SPLT 77X108 (DRAPES) ×1 IMPLANT
DRAPE U-SHAPE 47X51 STRL (DRAPES) ×3 IMPLANT
DRESSING AQUACEL AG SP 3.5X10 (GAUZE/BANDAGES/DRESSINGS) IMPLANT
DRSG AQUACEL AG ADV 3.5X14 (GAUZE/BANDAGES/DRESSINGS) IMPLANT
DRSG AQUACEL AG SP 3.5X10 (GAUZE/BANDAGES/DRESSINGS) ×3
DURAPREP 26ML APPLICATOR (WOUND CARE) ×3 IMPLANT
ELECT REM PT RETURN 15FT ADLT (MISCELLANEOUS) ×3 IMPLANT
FEMORAL ADAPTER (Orthopedic Implant) ×2 IMPLANT
GLOVE BIO SURGEON STRL SZ7.5 (GLOVE) ×3 IMPLANT
GLOVE BIO SURGEON STRL SZ8.5 (GLOVE) ×3 IMPLANT
GLOVE BIOGEL PI IND STRL 8 (GLOVE) ×1 IMPLANT
GLOVE BIOGEL PI INDICATOR 8 (GLOVE) ×2
GOWN STRL REUS W/TWL XL LVL3 (GOWN DISPOSABLE) ×6 IMPLANT
HANDPIECE INTERPULSE COAX TIP (DISPOSABLE) ×3
HOOD PEEL AWAY FLYTE STAYCOOL (MISCELLANEOUS) ×9 IMPLANT
IMMOBILIZER KNEE 20 (SOFTGOODS) ×3
IMMOBILIZER KNEE 20 THIGH 36 (SOFTGOODS) ×1 IMPLANT
INSERT TIBIAL TC3 15.0 (Knees) ×2 IMPLANT
KIT TURNOVER KIT A (KITS) IMPLANT
NDL HYPO 21X1.5 SAFETY (NEEDLE) ×2 IMPLANT
NEEDLE HYPO 21X1.5 SAFETY (NEEDLE) ×6 IMPLANT
NS IRRIG 1000ML POUR BTL (IV SOLUTION) ×3 IMPLANT
PACK TOTAL KNEE CUSTOM (KITS) ×3 IMPLANT
PATELLA DOME PFC 38MM (Knees) ×2 IMPLANT
PIN STEINMAN FIXATION KNEE (PIN) ×2 IMPLANT
POST AVE PFC 4MM (Knees) ×2 IMPLANT
PROTECTOR NERVE ULNAR (MISCELLANEOUS) ×3 IMPLANT
SET HNDPC FAN SPRY TIP SCT (DISPOSABLE) ×1 IMPLANT
STAPLER VISISTAT 35W (STAPLE) ×2 IMPLANT
STEM UNIVERSAL 115X12MM (Stem) ×2 IMPLANT
STEM UNIVERSAL 150X12MM (Stem) ×2 IMPLANT
SUT VIC AB 1 CTX 36 (SUTURE) ×3
SUT VIC AB 1 CTX36XBRD ANBCTR (SUTURE) ×1 IMPLANT
SUT VIC AB 2-0 CT1 27 (SUTURE) ×3
SUT VIC AB 2-0 CT1 TAPERPNT 27 (SUTURE) ×1 IMPLANT
SUT VIC AB 3-0 CT1 27 (SUTURE) ×3
SUT VIC AB 3-0 CT1 TAPERPNT 27 (SUTURE) ×1 IMPLANT
SWAB COLLECTION DEVICE MRSA (MISCELLANEOUS) ×2 IMPLANT
SWAB CULTURE ESWAB REG 1ML (MISCELLANEOUS) ×2 IMPLANT
SYR CONTROL 10ML LL (SYRINGE) ×6 IMPLANT
TRAY FOLEY MTR SLVR 16FR STAT (SET/KITS/TRAYS/PACK) ×3 IMPLANT
TRAY REVISION SZ 4 (Knees) ×2 IMPLANT
WATER STERILE IRR 1000ML POUR (IV SOLUTION) ×6 IMPLANT
WRAP KNEE MAXI GEL POST OP (GAUZE/BANDAGES/DRESSINGS) ×3 IMPLANT
YANKAUER SUCT BULB TIP 10FT TU (MISCELLANEOUS) ×3 IMPLANT

## 2019-04-13 NOTE — Discharge Instructions (Addendum)
INSTRUCTIONS AFTER JOINT REPLACEMENT  ° °o Remove items at home which could result in a fall. This includes throw rugs or furniture in walking pathways °o ICE to the affected joint every three hours while awake for 30 minutes at a time, for at least the first 3-5 days, and then as needed for pain and swelling.  Continue to use ice for pain and swelling. You may notice swelling that will progress down to the foot and ankle.  This is normal after surgery.  Elevate your leg when you are not up walking on it.   °o Continue to use the breathing machine you got in the hospital (incentive spirometer) which will help keep your temperature down.  It is common for your temperature to cycle up and down following surgery, especially at night when you are not up moving around and exerting yourself.  The breathing machine keeps your lungs expanded and your temperature down. ° ° °DIET:  As you were doing prior to hospitalization, we recommend a well-balanced diet. ° °DRESSING / WOUND CARE / SHOWERING ° °Keep the surgical dressing until follow up.  The dressing is water proof, so you can shower without any extra covering.  IF THE DRESSING FALLS OFF or the wound gets wet inside, change the dressing with sterile gauze.  Please use good hand washing techniques before changing the dressing.  Do not use any lotions or creams on the incision until instructed by your surgeon.   ° °ACTIVITY ° °o Increase activity slowly as tolerated, but follow the weight bearing instructions below.   °o No driving for 6 weeks or until further direction given by your physician.  You cannot drive while taking narcotics.  °o No lifting or carrying greater than 10 lbs. until further directed by your surgeon. °o Avoid periods of inactivity such as sitting longer than an hour when not asleep. This helps prevent blood clots.  °o You may return to work once you are authorized by your doctor.  ° ° ° °WEIGHT BEARING  ° °Weight bearing as tolerated with assist  device (walker, cane, etc) as directed, use it as long as suggested by your surgeon or therapist, typically at least 4-6 weeks. ° ° °EXERCISES ° °Results after joint replacement surgery are often greatly improved when you follow the exercise, range of motion and muscle strengthening exercises prescribed by your doctor. Safety measures are also important to protect the joint from further injury. Any time any of these exercises cause you to have increased pain or swelling, decrease what you are doing until you are comfortable again and then slowly increase them. If you have problems or questions, call your caregiver or physical therapist for advice.  ° °Rehabilitation is important following a joint replacement. After just a few days of immobilization, the muscles of the leg can become weakened and shrink (atrophy).  These exercises are designed to build up the tone and strength of the thigh and leg muscles and to improve motion. Often times heat used for twenty to thirty minutes before working out will loosen up your tissues and help with improving the range of motion but do not use heat for the first two weeks following surgery (sometimes heat can increase post-operative swelling).  ° °These exercises can be done on a training (exercise) mat, on the floor, on a table or on a bed. Use whatever works the best and is most comfortable for you.    Use music or television while you are exercising so that   the exercises are a pleasant break in your day. This will make your life better with the exercises acting as a break in your routine that you can look forward to.   Perform all exercises about fifteen times, three times per day or as directed.  You should exercise both the operative leg and the other leg as well. ° °Exercises include: °  °• Quad Sets - Tighten up the muscle on the front of the thigh (Quad) and hold for 5-10 seconds.   °• Straight Leg Raises - With your knee straight (if you were given a brace, keep it on),  lift the leg to 60 degrees, hold for 3 seconds, and slowly lower the leg.  Perform this exercise against resistance later as your leg gets stronger.  °• Leg Slides: Lying on your back, slowly slide your foot toward your buttocks, bending your knee up off the floor (only go as far as is comfortable). Then slowly slide your foot back down until your leg is flat on the floor again.  °• Angel Wings: Lying on your back spread your legs to the side as far apart as you can without causing discomfort.  °• Hamstring Strength:  Lying on your back, push your heel against the floor with your leg straight by tightening up the muscles of your buttocks.  Repeat, but this time bend your knee to a comfortable angle, and push your heel against the floor.  You may put a pillow under the heel to make it more comfortable if necessary.  ° °A rehabilitation program following joint replacement surgery can speed recovery and prevent re-injury in the future due to weakened muscles. Contact your doctor or a physical therapist for more information on knee rehabilitation.  ° ° °CONSTIPATION ° °Constipation is defined medically as fewer than three stools per week and severe constipation as less than one stool per week.  Even if you have a regular bowel pattern at home, your normal regimen is likely to be disrupted due to multiple reasons following surgery.  Combination of anesthesia, postoperative narcotics, change in appetite and fluid intake all can affect your bowels.  ° °YOU MUST use at least one of the following options; they are listed in order of increasing strength to get the job done.  They are all available over the counter, and you may need to use some, POSSIBLY even all of these options:   ° °Drink plenty of fluids (prune juice may be helpful) and high fiber foods °Colace 100 mg by mouth twice a day  °Senokot for constipation as directed and as needed Dulcolax (bisacodyl), take with full glass of water  °Miralax (polyethylene glycol)  once or twice a day as needed. ° °If you have tried all these things and are unable to have a bowel movement in the first 3-4 days after surgery call either your surgeon or your primary doctor.   ° °If you experience loose stools or diarrhea, hold the medications until you stool forms back up.  If your symptoms do not get better within 1 week or if they get worse, check with your doctor.  If you experience "the worst abdominal pain ever" or develop nausea or vomiting, please contact the office immediately for further recommendations for treatment. ° ° °ITCHING:  If you experience itching with your medications, try taking only a single pain pill, or even half a pain pill at a time.  You can also use Benadryl over the counter for itching or also to   help with sleep.   TED HOSE STOCKINGS:  Use stockings on both legs until for at least 2 weeks or as directed by physician office. They may be removed at night for sleeping.  MEDICATIONS:  See your medication summary on the After Visit Summary that nursing will review with you.  You may have some home medications which will be placed on hold until you complete the course of blood thinner medication.  It is important for you to complete the blood thinner medication as prescribed.  PRECAUTIONS:  If you experience chest pain or shortness of breath - call 911 immediately for transfer to the hospital emergency department.   If you develop a fever greater that 101 F, purulent drainage from wound, increased redness or drainage from wound, foul odor from the wound/dressing, or calf pain - CONTACT YOUR SURGEON.                                                   FOLLOW-UP APPOINTMENTS:  If you do not already have a post-op appointment, please call the office for an appointment to be seen by your surgeon.  Guidelines for how soon to be seen are listed in your After Visit Summary, but are typically between 1-4 weeks after surgery.  OTHER INSTRUCTIONS:   Knee  Replacement:  Do not place pillow under knee, focus on keeping the knee straight while resting. CPM instructions: 0-90 degrees, 2 hours in the morning, 2 hours in the afternoon, and 2 hours in the evening. Place foam block, curve side up under heel at all times except when in CPM or when walking.  DO NOT modify, tear, cut, or change the foam block in any way.  MAKE SURE YOU:   Understand these instructions.   Get help right away if you are not doing well or get worse.    Thank you for letting us be a part of your medical care team.  It is a privilege we respect greatly.  We hope these instructions will help you stay on track for a fast and full recovery!   Information on my medicine - ELIQUIS (apixaban)  This medication education was reviewed with me or my healthcare representative as part of my discharge preparation.  The pharmacist that spoke with me during my hospital stay was:    Why was Eliquis prescribed for you? Eliquis was prescribed for you to reduce the risk of blood clots forming after orthopedic surgery.    What do You need to know about Eliquis? Take your Eliquis TWICE DAILY - one tablet in the morning and one tablet in the evening with or without food.  It would be best to take the dose about the same time each day.  If you have difficulty swallowing the tablet whole please discuss with your pharmacist how to take the medication safely.  Take Eliquis exactly as prescribed by your doctor and DO NOT stop taking Eliquis without talking to the doctor who prescribed the medication.  Stopping without other medication to take the place of Eliquis may increase your risk of developing a clot.  After discharge, you should have regular check-up appointments with your healthcare provider that is prescribing your Eliquis.  What do you do if you miss a dose? If a dose of ELIQUIS is not taken at the scheduled time, take it as soon  as possible on the same day and twice-daily  administration should be resumed.  The dose should not be doubled to make up for a missed dose.  Do not take more than one tablet of ELIQUIS at the same time.  Important Safety Information A possible side effect of Eliquis is bleeding. You should call your healthcare provider right away if you experience any of the following: ? Bleeding from an injury or your nose that does not stop. ? Unusual colored urine (red or dark brown) or unusual colored stools (red or black). ? Unusual bruising for unknown reasons. ? A serious fall or if you hit your head (even if there is no bleeding).  Some medicines may interact with Eliquis and might increase your risk of bleeding or clotting while on Eliquis. To help avoid this, consult your healthcare provider or pharmacist prior to using any new prescription or non-prescription medications, including herbals, vitamins, non-steroidal anti-inflammatory drugs (NSAIDs) and supplements.  This website has more information on Eliquis (apixaban): http://[email or web address].com/eliquis/home

## 2019-04-13 NOTE — Op Note (Signed)
PATIENT ID:      Kari [name]  MRN:     [id number] DOB/AGE:    Apr 12, 1951 / 68 y.o.       OPERATIVE REPORT    DATE OF PROCEDURE:  04/13/2019       PREOPERATIVE DIAGNOSIS:   Loose Right Knee Replacement      Estimated body mass index is 25.09 kg/m as calculated from the following:   Height as of this encounter: 5\' 8"  (1.727 m).   Weight as of this encounter: 74.8 kg.                                                        POSTOPERATIVE DIAGNOSIS:   Loose Right Knee Replacement                                                                      PROCEDURE:  Procedure(s): Revision Right Knee Arthroplasty with removal of DePuy fixed-bearing Sigma implants are placed in the year 2001 and revision to 3 right TC 3 femur with a 150 mm x 12 mm stem translated 2 mm posteriorly.  15 mm TC 3 bearing, #4 MBT tray with a 12 mm x 110 mm stem.  And a 38 mm offset patellar implant as well.  On the tibia we used a double batch of DePuy HV cement on the femur and the patella a triple batch of DePuy HV cement  SURGEON: Kerin Salen    ASSISTANT:   Kerry Hough. [employer]   (Present and scrubbed throughout the case, critical for assistance with exposure, retraction, instrumentation, and closure.)         ANESTHESIA: Spinal, adductor canal block, Exparel  EBL: 400 cc  FLUID REPLACEMENT: 2 L crystalloid TOURNIQUET TIME: 35 min  DRAINS: none  Tranexamic Acid: 1 g IV, 2 g topical  COMPLICATIONS:  None     Specimens: Synovial fluid for Gram stain and culture.    INDICATIONS FOR PROCEDURE: Patient had primary right total knee done here in [place] in the 1990s had a revision done at [facility] in 2001 with placement of an MBT tray and what looks like retention of the femoral component all cemented this was a fixed-bearing knee.  Did relatively well until this year when she started to develop some pain and more importantly effusions.  Effusion was tapped in the office and came back negative for any  evidence of infection fluid is relatively clear.  Radiographs show some questionable loosening of the femoral implant with some cysts.  Bone scan was hot in this region as well as the tibial implant.  The effusion did recur and because of the pain and the effusion she is taken for exploration of right total knee and removal of any loose implants.  Risks and benefits of surgery been discussed and all questions answered.  DESCRIPTION OF PROCEDURE: The patient identified by armband, received  IV antibiotics, in the holding area at [facility] Patient taken to the operating room, appropriate anesthetic monitors were attached, and general endotracheal anesthesia induced with the  patient in supine position. Tourniquet applied high to the operative thigh. Lateral post and foot positioner applied to the table, the lower extremity was then prepped and draped in usual sterile fashion from the ankle to the tourniquet. Time-out procedure was performed.  We began the operation with the knee flexed 100 degrees, by making an anterior midline incision starting at handbreadth above the patella going over the patella 1 cm medial to and 4 cm distal to the tibial tubercle. Small bleeders in the skin and the subcutaneous tissue identified and cauterized. Transverse retinaculum was incised and reflected medially and a medial parapatellar arthrotomy was accomplished. the patella was everted once the patella patella had been everted were able to examine the femoral component which had erosion of bone peripherally around the implant extending for a centimeter underneath the implant in some areas.  We grasped the femoral component with the clamp from the Sigma RP set and it was obvious that the femoral component was loose when we stressed it bubbles came out from beneath the cement bone interface.  We then remove the femoral implant using quarter inch osteotome to break down the remaining interface.  After the femoral implant is  been removed it was obvious there was significant bone loss along the posterior condyles bilaterally as well is in the notch region.  We then directed our attention to the tibial implant and likewise remove scar tissue all the way around the edges we placed 1/4 inch osteotome under one edge of the tibial implant tapped it moderately with a hammer and the implant popped up obviously loose.  We then set about removing cement from the bone interface on the tibia and the femur using the [facility] instruments.  We also had actually remove a cement plug and placed in the tibia in year 2001 when she had a revision at [facility].  Once this was accomplished we then sequentially reamed the tibia up to 12 mm obtaining good chatter for 110 mm stem with a reamer in place we performed a cleanup cut on the proximal tibia removing water 2 mm of bone and there were some small cavitary defects were felt to be cementable with a stem.  We then sized for a 4 MBT tray conically reamed applied the delta fin keel punch and placed a trial tibial implant in place.  We then directed our attention to the femur and likewise reamed up to a 12 mm reamer for a 150 mm stem and with a reamer in place performed a distal femoral dust cut on the medial side only as the lateral side was quite deficient.  We then sized for a #3 right TC 3 femur and applied the anterior posterior and chamfer cutting guide and because of the bone deficiency really did not encounter any bone on we placed a saw through the guide.  We then applied the TC 3 notch cutting guide and did encounter bone there and obtained a good notch cut.  At this point we assembled a 3 righ Sigma TC 3 trial on a 150 mm x 12 mm stem and inserted in the distal femur with the correct rotation.  Trial reduction was performed with a 15 mm TC 3 trial the knee did come to full extension had good ligamentous stability in extension and flexion.  We then directed our attention to the patella which was not not  loose but the patellar button was partially fragmented and we removed it using a small ACL saw.  The button  that was in place was a 32 after removed it we had plenty of bone and sized for a 38 lollipop which was drilled for a new 38 patellar button.  At this point with all the trials in place a TXA sponge was placed in the wound the limb was wrapped with an Esmarch bandage and the tourniquet inflated to 300 mmHg for approximately 35 minutes.  During this period of time all trial components were removed bony surfaces were WaterPik cleaned and dried with suction and sponges.  We cemented in 2 separate batches the tibia first we mixed a double batch of HV cement applied it to the proximal tibia implant and bone and hammered the tibial implant into place and held in the correct rotation as the cement hardened.  10 minutes into the cementation of the tibia we mixed a second triple batch of cement for the femur applied to the distal femur and the distal femoral implant including the box and the distal up part of the stem.  In the correct rotation orientation followed by a 15 mm TC 3 trial the knee was then placed in 30 degrees of flexion with compression as the cement on the femur hardened.  The 38 mm patellar button was also cemented into place and held with a clamp.  The tourniquet was let down all surfaces were once again [facility] to clean Exparel was applied to the soft tissues using 10 cc syringes and 22-gauge needles.  The knee was then taken through range of motion confirming good stability and no impingement.  We closed in layers with running #1 Vicryl suture in the parapatellar arthrotomy and then 3-0 Vicryl suture in the subcutaneous and subcuticular layers.  A dressing of Aquacel and an Ace wrap was applied.  Patient was taken to the recovery room without difficulty.  Kerin Salen 04/13/2019, 12:14 PM

## 2019-04-13 NOTE — Evaluation (Signed)
Physical Therapy Evaluation Patient Details Name: Kari [name] MRN: [id number] DOB: Jan 14, 1951 Today's Date: 04/13/2019   History of Present Illness  68 yo female s/p R TKR revision on 04/13/19. PMH includes pacemaker, diverticulitis, GERD, chronic back pain, DVT, L RTC repair, R TKR 2001.  Clinical Impression   Pt presents with R knee pain, decreased R knee ROM, mild RLE weakness post-operatively, increased time and effort to perform mobility tasks, and decreased activity tolerance. Pt to benefit from acute PT to address deficits. Pt ambulated hallway distance with RW with min guard assist, verbal cuing for form, safety, and small steps for R knee stability initially provided throughout. Pt educated on ankle pumps (20/hour) to perform this afternoon/evening to increase circulation, to pt's tolerance and limited by pain. PT to progress mobility as tolerated, and will continue to follow acutely.        Follow Up Recommendations Supervision for mobility/OOB;Outpatient PT(Pt opposed to HHPT due to covid, recommending OPPT)    Equipment Recommendations  Rolling walker with 5" wheels    Recommendations for Other Services       Precautions / Restrictions Precautions Precautions: Fall;ICD/Pacemaker Restrictions Weight Bearing Restrictions: No Other Position/Activity Restrictions: WBAT      Mobility  Bed Mobility Overal bed mobility: Needs Assistance Bed Mobility: Supine to Sit     Supine to sit: Min guard;HOB elevated     General bed mobility comments: Min guard for safety, increased time and effort to perform.  Transfers Overall transfer level: Needs assistance Equipment used: Rolling walker (2 wheeled) Transfers: Sit to/from Stand Sit to Stand: Min guard;From elevated surface         General transfer comment: Min guard for safety, verbal cuing for hand placement when rising. Pt with no evidence of R knee instability in  standing/weightshifting  Ambulation/Gait Ambulation/Gait assistance: Min guard;Min assist Gait Distance (Feet): 100 Feet Assistive device: Rolling walker (2 wheeled) Gait Pattern/deviations: Step-to pattern;Step-through pattern;Decreased step length - right;Decreased step length - left;Decreased stride length;Decreased weight shift to right;Decreased stance time - right Gait velocity: decr   General Gait Details: Min guard for safety, occasional min assist for steadying. Pt with slight R knee weakness when attempting to take larger steps, pt corrected with smaller steps and UE use through RW. Verbal cuing for sequencing, placement in RW, small step size.  Stairs            Wheelchair Mobility    Modified Rankin (Stroke Patients Only)       Balance Overall balance assessment: Mild deficits observed, not formally tested                                           Pertinent Vitals/Pain Pain Assessment: 0-10 Pain Score: 4  Pain Location: R knee Pain Descriptors / Indicators: Sore;Aching Pain Intervention(s): Limited activity within patient's tolerance;Monitored during session;Premedicated before session;Repositioned;Ice applied    Home Living Family/patient expects to be discharged to:: Private residence Living Arrangements: Spouse/significant other Available Help at Discharge: Family;Available 24 hours/day(wife works from home due to COVID 19) Type of Home: House Home Access: Stairs to enter Entrance Stairs-Rails: None Technical brewer of Steps: 1 Home Layout: One level Home Equipment: Cane - single point      Prior Function Level of Independence: Independent         Comments: Pt is an ER night RN, developed R knee dysfunction at the  end of July 2020 necessitating surgery     Hand Dominance   Dominant Hand: Right    Extremity/Trunk Assessment   Upper Extremity Assessment Upper Extremity Assessment: Overall WFL for tasks assessed     Lower Extremity Assessment Lower Extremity Assessment: Overall WFL for tasks assessed;RLE deficits/detail RLE Deficits / Details: suspected post-surgical weakness; able to perform ankle pumps although painful, quad set, assisted heel slide to 60*, SLR without lift assist and 5* quad lag RLE Sensation: WNL;decreased light touch(decreased sensation of gluteal region)    Cervical / Trunk Assessment Cervical / Trunk Assessment: Normal  Communication   Communication: No difficulties  Cognition Arousal/Alertness: Awake/alert Behavior During Therapy: WFL for tasks assessed/performed Overall Cognitive Status: Within Functional Limits for tasks assessed                                        General Comments      Exercises     Assessment/Plan    PT Assessment Patient needs continued PT services  PT Problem List Decreased strength;Decreased mobility;Decreased range of motion;Decreased activity tolerance;Decreased balance;Decreased knowledge of use of DME;Pain       PT Treatment Interventions DME instruction;Therapeutic activities;Gait training;Therapeutic exercise;Patient/family education;Balance training;Stair training;Functional mobility training    PT Goals (Current goals can be found in the Care Plan section)  Acute Rehab PT Goals Patient Stated Goal: get more R knee ROM PT Goal Formulation: With patient Time For Goal Achievement: 04/20/19 Potential to Achieve Goals: Good    Frequency 7X/week   Barriers to discharge        Co-evaluation               AM-PAC PT "6 Clicks" Mobility  Outcome Measure Help needed turning from your back to your side while in a flat bed without using bedrails?: A Little Help needed moving from lying on your back to sitting on the side of a flat bed without using bedrails?: A Little Help needed moving to and from a bed to a chair (including a wheelchair)?: A Little Help needed standing up from a chair using your arms  (e.g., wheelchair or bedside chair)?: A Little Help needed to walk in hospital room?: A Little Help needed climbing 3-5 steps with a railing? : A Little 6 Click Score: 18    End of Session Equipment Utilized During Treatment: Gait belt Activity Tolerance: Patient tolerated treatment well Patient left: in chair;with chair alarm set;with call bell/phone within reach;with family/visitor present(on SCD break for skin integrity) Nurse Communication: Mobility status PT Visit Diagnosis: Other abnormalities of gait and mobility (R26.89);Difficulty in walking, not elsewhere classified (R26.2)    Time: 5465-6812 PT Time Calculation (min) (ACUTE ONLY): 34 min   Charges:   PT Evaluation $PT Eval Low Complexity: 1 Low PT Treatments $Gait Training: 8-22 mins        Julien Girt, PT Acute Rehabilitation Services Pager [phone]  Office [phone]  Brodie Scovell D Elonda Husky 04/13/2019, 7:40 PM

## 2019-04-13 NOTE — Transfer of Care (Signed)
Immediate Anesthesia Transfer of Care Note  Patient: Kari [name]  Procedure(s) Performed: Revision Right Knee Arthroplasty (Right Knee)  Patient Location: PACU  Anesthesia Type:Spinal  Level of Consciousness: awake, alert  and oriented  Airway & Oxygen Therapy: Patient Spontanous Breathing and Patient connected to face mask oxygen  Post-op Assessment: Report given to RN and Post -op Vital signs reviewed and stable  Post vital signs: Reviewed and stable  Last Vitals:  Vitals Value Taken Time  BP 108/59 04/13/19 1330  Temp 36.3 C 04/13/19 1304  Pulse 57 04/13/19 1337  Resp 19 04/13/19 1337  SpO2 97 % 04/13/19 1337  Vitals shown include unvalidated device data.  Last Pain:  Vitals:   04/13/19 1304  TempSrc:   PainSc: 0-No pain         Complications: No apparent anesthesia complications

## 2019-04-13 NOTE — Anesthesia Postprocedure Evaluation (Signed)
Anesthesia Post Note  Patient: Kari [name]  Procedure(s) Performed: Revision Right Knee Arthroplasty (Right Knee)     Patient location during evaluation: PACU Anesthesia Type: Spinal Level of consciousness: awake and alert Pain management: pain level controlled Vital Signs Assessment: post-procedure vital signs reviewed and stable Respiratory status: spontaneous breathing, nonlabored ventilation, respiratory function stable and patient connected to nasal cannula oxygen Cardiovascular status: blood pressure returned to baseline and stable Postop Assessment: no apparent nausea or vomiting Anesthetic complications: no    Last Vitals:  Vitals:   04/13/19 1629 04/13/19 1742  BP: 114/75 126/62  Pulse: (!) 58 (!) 55  Resp: 15   Temp: (!) 36.4 C (!) 36.4 C  SpO2: 100% 100%    Last Pain:  Vitals:   04/13/19 1500  TempSrc:   PainSc: 3                  Yuta Cipollone L Aleeza [place]

## 2019-04-13 NOTE — Anesthesia Procedure Notes (Signed)
Anesthesia Regional Block: Adductor canal block   Pre-Anesthetic Checklist: ,, timeout performed, Correct Patient, Correct Site, Correct Laterality, Correct Procedure, Correct Position, site marked, Risks and benefits discussed,  Surgical consent,  Pre-op evaluation,  At surgeon's request and post-op pain management  Laterality: Right  Prep: chloraprep       Needles:  Injection technique: Single-shot  Needle Type: Echogenic Stimulator Needle     Needle Length: 10cm  Needle Gauge: 21     Additional Needles:   Procedures:,,,, ultrasound used (permanent image in chart),,,,  Narrative:  Start time: 04/13/2019 8:35 AM End time: 04/13/2019 8:42 AM Injection made incrementally with aspirations every 5 mL.  Performed by: Personally  Anesthesiologist: Lidia Collum, MD  Additional Notes: Monitors applied. Injection made in 5cc increments. No resistance to injection. Good needle visualization. Patient tolerated procedure well.

## 2019-04-13 NOTE — Interval H&P Note (Signed)
History and Physical Interval Note:  04/13/2019 8:15 AM  Kari [name]  has presented today for surgery, with the diagnosis of Loose Right Knee Replacement.  The various methods of treatment have been discussed with the patient and family. After consideration of risks, benefits and other options for treatment, the patient has consented to  Procedure(s): Revision Right Knee Arthroplasty (Right) as a surgical intervention.  The patient's history has been reviewed, patient examined, no change in status, stable for surgery.  I have reviewed the patient's chart and labs.  Questions were answered to the patient's satisfaction.     Kerin Salen

## 2019-04-13 NOTE — Consult Note (Signed)
Cc: Eye Pain      Ophthalmology HPI: This is a 68 y.o.  female with a past ocular history listed below that presents with history of solar retinopathy right eye that presents with right eye pain following knee surgery. After awaking from anesthesisa she complains of right eye pain. Denies decrease in vision. Foreign body sensation. No other eye problems. No flashes of light, floaters, or double vision.      Past Ocular History: Solar Retinopathy    Last Eye Exam: 1 year ago    Primary Eye Care: [facility]   Past Medical History:  Diagnosis Date  . Arthritis   . Chronic back pain   . Diverticulitis   . DVT (deep venous thrombosis) [facility]) age 74   s/p  knee arthroscopy   . Herpes zoster conjunctivitis   . Menopause   . Mobitz type 2 second degree AV block    2:1/notes 02/16/2013  . PONV (postoperative nausea and vomiting)   . Presence of permanent cardiac pacemaker   . Seasonal allergies    "spring & fall; not q year" (02/17/2013)  . Shoulder impingement    and left rotator cuff tear     Past Surgical History:  Procedure Laterality Date  . ACHILLES TENDON SURGERY    . ARTHROGRAM KNEE Right 1975  . CHOLECYSTECTOMY  1990's  . colonscopy    . ESOPHAGOGASTRODUODENOSCOPY    . EYE SURGERY     muscle release right eye  . INSERT / REPLACE / REMOVE PACEMAKER  02/17/2013   [employer] Advantio dual-chamber pacemaker, model [id number],  . KNEE ARTHROSCOPY Right 1982, 1983, 1995, 1996  . KNEE ARTHROSCOPY W/ ACL RECONSTRUCTION Left 1984  . LAPAROSCOPIC SIGMOID COLECTOMY N/A 01/01/2015   Procedure: LAPAROSCOPIC SIGMOID COLECTOMY;  Surgeon: Stark Klein, MD;  Location: [facility];  Service: General;  Laterality: N/A;  . PERMANENT PACEMAKER INSERTION N/A 02/17/2013   Procedure: PERMANENT PACEMAKER INSERTION;  Surgeon: Evans Lance, MD;  Location: [facility] CATH LAB;  Service: Cardiovascular;  Laterality: N/A;  . SHOULDER ARTHROSCOPY Left 09/05/2016   Procedure: LEFT SHOULDER ARTHROSCOPY  , ACROMIOPLASTY AND ROTATOR CUFF REPAIR;  Surgeon: Melrose Nakayama, MD;  Location: [place];  Service: Orthopedics;  Laterality: Left;  . TONSILLECTOMY  1961  . TOTAL KNEE ARTHROPLASTY Right 2000; 06/2000   "replaced w/appropriate hardware" (02/17/2013)  . TRIGGER FINGER RELEASE Right ~ 2012   "thumb" (02/17/2013)  . VAGINAL HYSTERECTOMY  ~ 1994   partial      Social History   Socioeconomic History  . Marital status: Married    Spouse name: Not on file  . Number of children: Not on file  . Years of education: Not on file  . Highest education level: Not on file  Occupational History  . Occupation: Programmer, multimedia: [employer] [place]: [employer] Needs  . Financial resource strain: Not on file  . Food insecurity    Worry: Not on file    Inability: Not on file  . Transportation needs    Medical: Not on file    Non-medical: Not on file  Tobacco Use  . Smoking status: Never Smoker  . Smokeless tobacco: Never Used  Substance and Sexual Activity  . Alcohol use: Yes    Comment: occasional wine   . Drug use: No  . Sexual activity: Yes    Birth control/protection: Surgical    Comment: 02/17/2013 "I have a same sex partner"  Lifestyle  . Physical activity    Days per week: Not on file    Minutes per session: Not on file  . Stress: Not on file  Relationships  . Social Herbalist on phone: Not on file    Gets together: Not on file    Attends religious service: Not on file    Active member of club or organization: Not on file    Attends meetings of clubs or organizations: Not on file    Relationship status: Not on file  . Intimate partner violence    Fear of current or ex partner: Not on file    Emotionally abused: Not on file    Physically abused: Not on file    Forced sexual activity: Not on file  Other Topics Concern  . Not on file  Social History Narrative  . Not on file     Allergies  Allergen Reactions  . Ceftin Itching     Tolerates amoxicillin  . Vancomycin Hives  . Bactrim [Sulfamethoxazole-Trimethoprim] Hives  . Levofloxacin Other (See Comments)    "spacey," tolerates Cipro     No current facility-administered medications on file prior to encounter.    Current Outpatient Medications on File Prior to Encounter  Medication Sig Dispense Refill  . Ascorbic Acid (VITAMIN C) 100 MG tablet Take 200 mg by mouth daily.    Marland Kitchen aspirin EC 81 MG tablet Take 81 mg by mouth daily.    . Calcium Carb-Cholecalciferol (CALCIUM 600+D3 PO) Take 1 tablet by mouth daily.    . Cyanocobalamin (VITAMIN B-12) 2500 MCG SUBL Place 2,500 mcg under the tongue daily.     . diclofenac sodium (VOLTAREN) 1 % GEL Apply 1-2 g topically 4 (four) times daily as needed (knee pain.).    Marland Kitchen esomeprazole (NEXIUM) 40 MG capsule Take 40 mg by mouth daily.   9  . Melatonin 3 MG TABS Take 6 mg by mouth at bedtime as needed (sleep.).    Marland Kitchen Multiple Vitamin (MULTIVITAMIN WITH MINERALS) TABS Take 1 tablet by mouth daily.    . Omega-3 Fatty Acids (FISH OIL ULTRA) 1400 MG CAPS Take 1,400 mg by mouth daily.    . Probiotic Product (PROBIOTIC FORMULA PO) Take 1 capsule by mouth daily.     . valACYclovir (VALTREX) 1000 MG tablet Take 1,000 mg by mouth daily as needed (herpes zoster).     Marland Kitchen acetaminophen (TYLENOL) 500 MG tablet Take 1,000 mg by mouth every 6 (six) hours as needed (for pain).    . fluticasone (FLONASE) 50 MCG/ACT nasal spray Place 1 spray into both nostrils daily as needed for allergies.        ROS    Exam:  General: Awake, Alert, Oriented *3  Vision (near): without correction    OD: 20/100  OS: 20/40  Confrontational Field:   Full to count fingers, both eyes  Extraocular Motility:  Full ductions and versions, both eyes   External:   Normal Symmetry, V1-3 intact, infraorbital nerve appears intact.     Pupils  OD: 63mm to 82mm reactive without afferent pupillary defect (APD)  OS: 4mm to 62mm reactive without afferent pupillary  defect (APD)   IOP(tonopen)  OD: 15  OS: 14  Slit Lamp Exam:  Lids/Lashes  OD: Normal Lids and lashes, No lesion or injury  OS: Normal lids and lashes, nor lesion or injury  Conjucntiva/Sclera  OD: White and quiet  OS: White and quiet  Cornea  OD: Linear Punctate  epithelial erosions (interpalbebral).    OS: Clear without abrasion or defect  Anterior Chamber  OD: Deep and quiet  OS: Deep and quiet  Iris  OD: Normal iris architecture  OS: Normal Iris Architecture   Lens  OD: Trace NSC  OS: Trace NSC  Anterior Vitreous  OD: Clear, without cell  OS: Clear without cell   POSTERIOR POLE EXAM (Dialated with phenylephrine and tropicamide.Dilation may last up to 24 hours)  View:   OD: 20/20 view without opacities  OS: 20/20 view without opacities  Vitreous:   OD: Clear, no cell  OS: Clear, no cell  Disc:   OD: flat, sharp margin, with appropriate color  OS: flat, sharp margin, with appropriate color  C:D Ratio:   OD: 0.4  OS: 0.4  Macula  OD: Blunted foveal reflex.  ?RPE changes  OS: Flat with appropriate light reflex  Vessels  OD: Normal vasculature  OS: Normal vasculature  Periphery  OD: Flat 360 degrees without tear, hole or detachment  OS: Flat 360 degrees without tear, hole or detachment     Assessment and Plan:   This is 68 y.o.  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  No frank epi defect.  Recommend erythromycin ophthalmic ointment TID x 3 days.  Follow up as outpatient for new or worsening symptoms.  Patient also has a long standing history of solar retinopathy following staring at the sun "when at [place]." No intervention required for solar retinopathy.

## 2019-04-13 NOTE — Anesthesia Procedure Notes (Signed)
Spinal  Patient location during procedure: OR Staffing Anesthesiologist: Lameisha Schuenemann E, MD Performed: anesthesiologist  Preanesthetic Checklist Completed: patient identified, surgical consent, pre-op evaluation, timeout performed, IV checked, risks and benefits discussed and monitors and equipment checked Spinal Block Patient position: sitting Prep: site prepped and draped and DuraPrep Patient monitoring: continuous pulse ox, blood pressure and heart rate Approach: midline Location: L3-4 Injection technique: single-shot Needle Needle type: Pencan  Needle gauge: 24 G Needle length: 9 cm Additional Notes Functioning IV was confirmed and monitors were applied. Sterile prep and drape, including hand hygiene and sterile gloves were used. The patient was positioned and the spine was prepped. The skin was anesthetized with lidocaine.  Free flow of clear CSF was obtained prior to injecting local anesthetic into the CSF. The needle was carefully withdrawn. The patient tolerated the procedure well.      

## 2019-04-14 LAB — CBC
HCT: 34.5 % — ABNORMAL LOW (ref 36.0–46.0)
Hemoglobin: 11.1 g/dL — ABNORMAL LOW (ref 12.0–15.0)
MCH: 31.8 pg (ref 26.0–34.0)
MCHC: 32.2 g/dL (ref 30.0–36.0)
MCV: 98.9 fL (ref 80.0–100.0)
Platelets: 289 10*3/uL (ref 150–400)
RBC: 3.49 MIL/uL — ABNORMAL LOW (ref 3.87–5.11)
RDW: 14 % (ref 11.5–15.5)
WBC: 14.9 10*3/uL — ABNORMAL HIGH (ref 4.0–10.5)
nRBC: 0 % (ref 0.0–0.2)

## 2019-04-14 LAB — BASIC METABOLIC PANEL
Anion gap: 9 (ref 5–15)
BUN: 13 mg/dL (ref 8–23)
CO2: 26 mmol/L (ref 22–32)
Calcium: 9.1 mg/dL (ref 8.9–10.3)
Chloride: 103 mmol/L (ref 98–111)
Creatinine, Ser: 0.74 mg/dL (ref 0.44–1.00)
GFR calc Af Amer: >60 mL/min (ref >60)
GFR calc non Af Amer: >60 mL/min (ref >60)
Glucose, Bld: 124 mg/dL — ABNORMAL HIGH (ref 70–99)
Potassium: 4 mmol/L (ref 3.5–5.1)
Sodium: 138 mmol/L (ref 135–145)

## 2019-04-14 MED ORDER — APIXABAN 2.5 MG PO TABS: 2.5000 mg | ORAL_TABLET | Freq: Two times a day (BID) | ORAL | 0 refills | Status: DC

## 2019-04-14 NOTE — Consult Note (Signed)
   [facility] CM Inpatient Consult   04/14/2019  Kari [name] [birth year] [id number]   Patient is currently active with [facility] for chronic disease management services. Patient has been engaged by a [facility] [place] Coordinator for the [place].  Our community based plan of care has focused on disease management and community resource support.   Patient will receive a post hospital call and will be evaluated for assessments and disease process education.   Will follow for progress and disposition needs.  Plan: Patient will be followed by [facility] Coordinator who is aware of patient's hospitalization.  For additional questions or referrals please contact:  Kari Brood, RN BSN [facility] Liaison  [phone] business mobile phone Toll free office [phone]  Fax number: [phone] [name]

## 2019-04-14 NOTE — Discharge Summary (Addendum)
Patient ID: Kari [name] MRN: [id number] DOB/AGE: 66/09/1950 66 y.o.  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 Arthritis   . Chronic back pain   . Diverticulitis   . DVT (deep venous thrombosis) [facility]) age 38   s/p  knee arthroscopy   . Herpes zoster conjunctivitis   . Menopause   . Mobitz type 2 second degree AV block    2:1/notes 02/16/2013  . PONV (postoperative nausea and vomiting)   . Presence of permanent cardiac pacemaker   . Seasonal allergies    "spring & fall; not q year" (02/17/2013)  . Shoulder impingement    and left rotator cuff tear    Surgeries: Procedure(s): Revision Right Knee Arthroplasty on 04/13/2019   Consultants:   Discharged Condition: Improved  Hospital Course: Kari [name] is an 68 y.o. female who was admitted 04/13/2019 for operative treatment ofPainful total knee replacement, right ([place]). Patient has severe unremitting pain that affects sleep, daily activities, and work/hobbies. After pre-op clearance the patient was taken to the operating room on 04/13/2019 and underwent  Procedure(s): Revision Right Knee Arthroplasty.    Patient was given perioperative antibiotics:  Anti-infectives (From admission, onward)   Start     Dose/Rate Route Frequency Ordered Stop   04/13/19 1547  valACYclovir (VALTREX) tablet 1,000 mg     1,000 mg Oral Daily PRN 04/13/19 1549     04/13/19 1535  valACYclovir (VALTREX) tablet 1,000 mg  Status:  Discontinued     1,000 mg Oral Daily PRN 04/13/19 1536 04/13/19 1547   04/13/19 1300  ceFAZolin (ANCEF) IVPB 2g/100 mL premix     2 g 200 mL/hr over 30 Minutes Intravenous On call 04/13/19 1256 04/13/19 1635   04/13/19 0645  ceFAZolin (ANCEF) IVPB 2g/100 mL premix     2 g 200 mL/hr over 30 Minutes Intravenous On call to O.R. 04/13/19 5102 04/13/19 0847       Patient was given sequential compression devices, early ambulation, and chemoprophylaxis to prevent DVT.  Patient benefited maximally from hospital stay and there were no complications.    Recent vital signs:  Patient Vitals for the past 24 hrs:  BP Temp Temp src Pulse Resp SpO2  04/14/19 1028 116/67 97.6 F (36.4 C) - (!) 55 16 98 %  04/14/19 0508 124/66 (!) 97.5 F (36.4 C) Oral (!) 50 14 100 %  04/14/19 0101 137/65 97.7 F (36.5 C) - (!) 50 18 100 %  04/13/19 2121 127/76 (!) 97.5 F (36.4 C) Axillary (!) 50 16 100 %  04/13/19 1842 123/68 97.7 F (36.5 C) - (!) 58 16 100 %  04/13/19 1742 126/62 (!) 97.5 F (36.4 C) - (!) 55 - 100 %     Recent laboratory studies:  Recent Labs    04/14/19 0252  WBC 14.9*  HGB 11.1*  HCT 34.5*  PLT 289  NA 138  K 4.0  CL 103  CO2 26  BUN 13  CREATININE 0.74  GLUCOSE 124*  CALCIUM 9.1     Discharge Medications:   Allergies as of 04/14/2019      Reactions   Ceftin Itching   Tolerates amoxicillin   Vancomycin Hives   Bactrim [sulfamethoxazole-trimethoprim] Hives   Levofloxacin Other (See Comments)   "spacey,"  tolerates Cipro      Medication List    STOP taking these medications   acetaminophen 500 MG tablet Commonly known as: TYLENOL   aspirin EC 81 MG tablet   ciprofloxacin 500 MG tablet Commonly known as: CIPRO     TAKE these medications   apixaban 2.5 MG Tabs tablet Commonly known as: Eliquis Take 1 tablet (2.5 mg total) by mouth 2 (two) times daily.   CALCIUM 600+D3 PO Take 1 tablet by mouth daily.   diclofenac sodium 1 % Gel Commonly known as: VOLTAREN Apply 1-2 g topically 4 (four) times daily as needed (knee pain.).   esomeprazole 40 MG capsule Commonly known as: NEXIUM Take 40 mg by mouth daily.   Fish Oil Ultra 1400 MG Caps Take 1,400 mg by mouth daily.   fluticasone 50 MCG/ACT nasal spray Commonly  known as: FLONASE Place 1 spray into both nostrils daily as needed for allergies.   Melatonin 3 MG Tabs Take 6 mg by mouth at bedtime as needed (sleep.).   multivitamin with minerals Tabs tablet Take 1 tablet by mouth daily.   oxyCODONE-acetaminophen 5-325 MG tablet Commonly known as: PERCOCET/ROXICET Take 1 tablet by mouth every 4 (four) hours as needed for severe pain.   PROBIOTIC FORMULA PO Take 1 capsule by mouth daily.   tiZANidine 2 MG tablet Commonly known as: ZANAFLEX Take 1 tablet (2 mg total) by mouth every 6 (six) hours as needed.   valACYclovir 1000 MG tablet Commonly known as: VALTREX Take 1,000 mg by mouth daily as needed (herpes zoster).   Vitamin B-12 2500 MCG Subl Place 2,500 mcg under the tongue daily.   vitamin C 100 MG tablet Take 200 mg by mouth daily.            Durable Medical Equipment  (From admission, onward)         Start     Ordered   04/13/19 1536  DME Walker rolling  Once    Question:  Patient needs a walker to treat with the following condition  Answer:  Status post right knee replacement   04/13/19 1536   04/13/19 1536  DME 3 n 1  Once     04/13/19 1536           Discharge Care Instructions  (From admission, onward)         Start     Ordered   04/14/19 0000  Change dressing    Comments: Change dressing Only if drainage exceeds 40% of window on dressing   04/14/19 0837          Diagnostic Studies: Dg Chest 2 View  Result Date: 04/11/2019 CLINICAL DATA:  Preop evaluation for upcoming knee surgery EXAM: CHEST - 2 VIEW COMPARISON:  03/11/2013 FINDINGS: Cardiac shadow is within normal limits. Pacing device is again seen and stable. The lungs are well aerated without focal infiltrate or sizable effusion. No bony abnormality is noted. IMPRESSION: No active cardiopulmonary disease. Electronically Signed   By: Inez Catalina M.D.   On: 04/11/2019 16:29   Nm Bone Scan 3 Phase  Result Date: 03/28/2019 CLINICAL DATA:  RIGHT knee  pain and swelling beginning 7 days ago, underwent RIGHT total knee replacement surgery approximately 20 years ago EXAM: NUCLEAR MEDICINE 3-PHASE BONE SCAN TECHNIQUE: Radionuclide angiographic images, immediate static blood pool images, and 3-hour delayed static images were obtained of the knees after intravenous injection of radiopharmaceutical. RADIOPHARMACEUTICALS:  20.7 mCi Tc-64m MDP IV COMPARISON:  None Radiographic correlation: None  recent FINDINGS: Vascular phase: Increased blood flow to periarticular region of RIGHT knee. Normal blood flow to LEFT knee. Blood pool phase: Increased blood pool surrounding LEFT knee joint. Photopenic defect at RIGHT knee from prosthetic hardware. Normal blood pool at LEFT knee. Delayed phase: Normal uptake of tracer at LEFT knee. Focal increased tracer accumulation is identified at the medial and lateral tibial plateau adjacent to the prosthesis, as well as at the lateral femoral condyle. Aseptic loosening and infection of the RIGHT knee prosthesis are not excluded. IMPRESSION: Increased blood flow, blood pool and focally increased delayed uptake of tracer adjacent to tibial greater than femoral components of the RIGHT knee prosthesis as above. Unable to exclude aseptic loosening or infection of the RIGHT knee prosthesis with this appearance. Electronically Signed   By: Lavonia Dana M.D.   On: 03/28/2019 14:13   Dg Knee Right Port  Result Date: 04/13/2019 CLINICAL DATA:  Right knee replacement EXAM: PORTABLE RIGHT KNEE - 1-2 VIEW COMPARISON:  None. FINDINGS: Changes of right knee replacement. No hardware or bony complicating feature. Soft tissue and joint space gas noted. IMPRESSION: Right knee replacement.  No visible complicating feature. Electronically Signed   By: Rolm Baptise M.D.   On: 04/13/2019 13:48    Disposition: Discharge disposition: 01-Home or Self Care       Discharge Instructions    Call MD / Call 911   Complete by: As directed    If you  experience chest pain or shortness of breath, CALL 911 and be transported to the hospital emergency room.  If you develope a fever above 101 F, pus (white drainage) or increased drainage or redness at the wound, or calf pain, call your surgeon's office.   Change dressing   Complete by: As directed    Change dressing Only if drainage exceeds 40% of window on dressing   Constipation Prevention   Complete by: As directed    Drink plenty of fluids.  Prune juice may be helpful.  You may use a stool softener, such as Colace (over the counter) 100 mg twice a day.  Use MiraLax (over the counter) for constipation as needed.   Diet - low sodium heart healthy   Complete by: As directed    Increase activity slowly as tolerated   Complete by: As directed       Follow-up Information    Frederik Pear, MD In 2 weeks.   Specialty: Orthopedic Surgery Contact information: [place] [address] [phone]            Signed: Joanell Rising 04/14/2019, 4:46 PM

## 2019-04-14 NOTE — TOC Transition Note (Signed)
Transition of Care [facility]) - CM/SW Discharge Note   Patient Details  Name: KERY HALTIWANGER MRN: [id number] Date of Birth: 1951-07-31  Transition of [facility]) CM/SW Contact:  Leeroy Cha, RN Phone Number: 04/14/2019, 12:26 PM   Clinical Narrative:    Discharged to home with dme and hhc through [facility].   Final next level of care: [place] Barriers to Discharge: No Barriers Identified   Patient Goals and CMS Choice Patient states their goals for this hospitalization and ongoing recovery are:: to go home and get better CMS Medicare.gov Compare Post Acute Care list provided to:: Patient Choice offered to / list presented to : Patient  Discharge Placement                       Discharge Plan and Services   Discharge Planning Services: CM Consult Post Acute Care Choice: Durable Medical Equipment, Home Health          DME Arranged: Walker rolling, 3-N-1   Date DME Agency Contacted: 04/14/19 Time DME Agency Contacted: 0900 Representative spoke with at DME Agency: Ovid Curd HH Arranged: PT [place]: [place] ([place]) Date [place]: 04/14/19 Time [place]: 7588 Representative spoke with at [place]: Santiago Glad byrd  Social Determinants of Health ([place]) Interventions     Readmission Risk Interventions No flowsheet data found.

## 2019-04-14 NOTE — Plan of Care (Signed)
Pt ready for D\C

## 2019-04-14 NOTE — Progress Notes (Signed)
Physical Therapy Treatment Patient Details Name: Kari [name] MRN: [id number] DOB: 05-Aug-1951 Today's Date: 04/14/2019    History of Present Illness 68 yo female s/p R TKR revision on 04/13/19. PMH includes pacemaker, diverticulitis, GERD, chronic back pain, DVT, L RTC repair, R TKR 2001.    PT Comments    POD # 1 am session Assisted OOB.  General bed mobility comments: instructed how to use a belt loop to self assist LE off bed.  General transfer comment: increased time demonsrating good use of hands to steady self.  General Gait Details: good safety cognition and functional distance.  Then returned to room to perform some TE's following HEP handout.  Instructed on proper tech, freq as well as use of ICE.   Addressed all mobility questions, discussed appropriate activity, educated on use of ICE.  Pt ready for D/C to home.   Follow Up Recommendations  Supervision for mobility/OOB;Outpatient PT     Equipment Recommendations  Rolling walker with 5" wheels(youth)    Recommendations for Other Services       Precautions / Restrictions Precautions Precautions: Fall;ICD/Pacemaker;Knee Restrictions Weight Bearing Restrictions: No Other Position/Activity Restrictions: WBAT    Mobility  Bed Mobility Overal bed mobility: Needs Assistance Bed Mobility: Supine to Sit     Supine to sit: Supervision     General bed mobility comments: instructed how to use a belt loop to self assist LE off bed  Transfers Overall transfer level: Needs assistance Equipment used: Rolling walker (2 wheeled) Transfers: Sit to/from Stand Sit to Stand: Supervision         General transfer comment: increased time demonsrating good use of hands to steady self  Ambulation/Gait Ambulation/Gait assistance: Supervision;Min guard Gait Distance (Feet): 110 Feet Assistive device: Rolling walker (2 wheeled) Gait Pattern/deviations: Step-to pattern;Step-through pattern;Decreased step length -  right;Decreased step length - left;Decreased stride length;Decreased weight shift to right;Decreased stance time - right     General Gait Details: good safety cognition and functional distance   Stairs Stairs: (no stairs to enter home)           Wheelchair Mobility    Modified Rankin (Stroke Patients Only)       Balance                                            Cognition Arousal/Alertness: Awake/alert Behavior During Therapy: WFL for tasks assessed/performed Overall Cognitive Status: Within Functional Limits for tasks assessed                                       Exercises   Total Knee Replacement TE's 10 reps B LE ankle pumps 10 reps towel squeezes 10 reps knee presses 10 reps heel slides  10 reps SAQ's 10 reps SLR's 10 reps ABD Followed by ICE     General Comments        Pertinent Vitals/Pain Pain Assessment: 0-10 Pain Score: 6  Pain Location: R knee Pain Descriptors / Indicators: Sore;Aching Pain Intervention(s): Monitored during session;Premedicated before session;Ice applied;Repositioned    Home Living                      Prior Function            PT Goals (current goals can now be  found in the care plan section) Progress towards PT goals: Progressing toward goals    Frequency    7X/week      PT Plan Current plan remains appropriate    Co-evaluation              AM-PAC PT "6 Clicks" Mobility   Outcome Measure  Help needed turning from your back to your side while in a flat bed without using bedrails?: A Little Help needed moving from lying on your back to sitting on the side of a flat bed without using bedrails?: A Little Help needed moving to and from a bed to a chair (including a wheelchair)?: A Little Help needed standing up from a chair using your arms (e.g., wheelchair or bedside chair)?: A Little Help needed to walk in hospital room?: A Little Help needed climbing 3-5  steps with a railing? : A Little 6 Click Score: 18    End of Session Equipment Utilized During Treatment: Gait belt Activity Tolerance: Patient tolerated treatment well Patient left: in chair;with chair alarm set;with call bell/phone within reach;with family/visitor present Nurse Communication: (pt ready for D/C to home) PT Visit Diagnosis: Other abnormalities of gait and mobility (R26.89);Difficulty in walking, not elsewhere classified (R26.2)     Time: 1000-1026 PT Time Calculation (min) (ACUTE ONLY): 26 min  Charges:  $Gait Training: 8-22 mins $Therapeutic Exercise: 8-22 mins                     Rica Koyanagi  PTA Acute  Rehabilitation Services Pager      [phone] Office      [phone]

## 2019-04-14 NOTE — Progress Notes (Signed)
PATIENT ID: RAEVEN PINT  MRN: [id number]  DOB/AGE:  March 20, 1951 / 67 y.o.  1 Day Post-Op Procedure(s) (LRB): Revision Right Knee Arthroplasty (Right)    PROGRESS NOTE Subjective: Patient is alert, oriented, no Nausea, no Vomiting, yes passing gas. Taking PO well. Denies SOB, Chest or Calf Pain. Using Incentive Spirometer, PAS in place. Ambulate in room, Patient reports pain as 1/10 .    Objective: Vital signs in last 24 hours: Vitals:   04/13/19 1842 04/13/19 2121 04/14/19 0101 04/14/19 0508  BP: 123/68 127/76 137/65 124/66  Pulse: (Abnormal) 58 (Abnormal) 50 (Abnormal) 50 (Abnormal) 50  Resp: 16 16 18 14   Temp: 97.7 F (36.5 C) (Abnormal) 97.5 F (36.4 C) 97.7 F (36.5 C) (Abnormal) 97.5 F (36.4 C)  TempSrc:  Axillary  Oral  SpO2: 100% 100% 100% 100%  Weight:      Height:          Intake/Output from previous day: I/O last 3 completed shifts: In: 4548.3 [P.O.:900; I.V.:3348.3; IV Piggyback:300] Out: 0929 [Urine:1315; Blood:400]   Intake/Output this shift: No intake/output data recorded.   LABORATORY DATA: Recent Labs    04/11/19 1609 04/14/19 0252  WBC 6.6 14.9*  HGB 12.6 11.1*  HCT 39.1 34.5*  PLT 306 289  NA 138 138  K 4.7 4.0  CL 102 103  CO2 26 26  BUN 19 13  CREATININE 0.83 0.74  GLUCOSE 106* 124*  INR 0.9  --   CALCIUM 9.5 9.1    Examination: Neurologically intact ABD soft Neurovascular intact Sensation intact distally Intact pulses distally Dorsiflexion/Plantar flexion intact Incision: dressing C/D/I No cellulitis present Compartment soft}  Assessment:   1 Day Post-Op Procedure(s) (LRB): Revision Right Knee Arthroplasty (Right) ADDITIONAL DIAGNOSIS: Expected Acute Blood Loss Anemia, Cardiac Arrythmia AV block type 2  Patient's anticipated LOS is less than 2 midnights, meeting these requirements: - Younger than 69 - Lives within 1 hour of care - Has a competent adult at home to recover with post-op recover - NO history of  -  Chronic pain requiring opiods  - Diabetes  - Coronary Artery Disease  - Heart failure  - Heart attack  - Stroke  - DVT/VTE  - Cardiac arrhythmia  - Respiratory Failure/COPD  - Renal failure  - Anemia  - Advanced Liver disease       Plan: PT/OT WBAT, AROM and PROM  DVT Prophylaxis:  SCDx72hrs, ASA 81 mg BID x 2 weeks DISCHARGE PLAN: Home DISCHARGE NEEDS: Walker and 3-in-1 comode seat, Opt PT     Kerin Salen 04/14/2019, 8:34 AM Patient ID: Marybelle Killings, female   DOB: [birth year], 68 y.o.   MRN: [id number]

## 2019-04-15 ENCOUNTER — Other Ambulatory Visit: Payer: Self-pay | Admitting: *Deleted

## 2019-04-15 ENCOUNTER — Encounter (HOSPITAL_COMMUNITY): Payer: Self-pay | Admitting: Orthopedic Surgery

## 2019-04-15 NOTE — Patient Outreach (Signed)
[facility]) Care Management  04/15/2019  Kari [name] 16-Nov-1950 [id number]  Transition of care call/case closure   Referral received: 04/07/19 Initial outreach: 04/12/19 for pre op call Insurance: [facility] Choice Plan   Subjective: Initial successful telephone call to patient's preferred number in order to complete transition of care assessment; 2 HIPAA identifiers verified. Explained purpose of call and completed transition of care assessment.  Kari [name] states she is in quite a bit of pain and her knee is very swollen, she has ice applied. She says the surgery took much longer than anticipated due to the joint deterioration.  She says the surgical dressing is unremarkable, tolerating diet, denies bowel or bladder problems.  Spouse is assisting with his/her recovery.  She says she does have the hospital indemnity She says he/she uses a [facility] outpatient pharmacy.  She denies educational needs related to staying safe during the COVID 19 pandemic.    Objective:  Kari [name] had Revision Right Knee Arthroplasty with removal of DePuy fixed-bearing Sigma implants are placed in the year 2001 and revision to 3 right TC 3 femur with a 150 mm x 12 mm stem translated 2 mm posteriorly.  15 mm TC 3 bearing, #4 MBT tray with a 12 mm x 110 mm stem.  And a 38 mm offset patellar implant as well.  On the tibia we used a double batch of DePuy HV cement on the femur and the patella a triple batch of DePuy HV cement on 04/13/19 at [facility]. (original right knee surgery was done in 2000)  Comorbidities include: DVT, Mobitz type 2 second degree AV block- s/p pacemaker placement, diverticulitis, seasonal allergies, osteoarthritis, diverticulosis She was discharged to home on 04/14/19 with home health services of PT to be provided by [facility], she was issued a rolling walker but did not receive a 3 in 1 as ordered om the discharge summary.   Assessment:  Patient voices good understanding  of all discharge instructions.  See transition of care flowsheet for assessment details.   Plan:  Encouraged Kari [name] to contact the home health agency about providing a 3 in 1 that was ordered by the surgeon or to ask if friends or family or church if one is available to borrow.  Reviewed hospital discharge diagnosis of right knee revision and treatment plan using hospital discharge instructions, assessing medication adherence, reviewing problems requiring provider notification, and discussing the importance of follow up with surgeon and/or specialists as directed. Reviewed [place]'s announcements that all [place] members will receive the Healthy Lifestyle Premium rate in 2021 and that the Health Rewards Physical Activity Program has been temporarily suspended. No ongoing care management needs identified so will close case to [place] Management services and route successful outreach letter with [place] Management pamphlet and 24 Hour Nurse Line Magnet to [facility] clinical pool to be mailed to patient's home address.  With Kari [name]'s permission, will include the Care Timeline document with her letter to submit to [employer] in filing her hospital indemnity claim.   Barrington Ellison RN,CCM,CDE [place] Management Coordinator Office Phone [phone] Office Fax [phone]

## 2019-04-16 LAB — BODY FLUID CULTURE: Culture: NO GROWTH

## 2019-04-18 DIAGNOSIS — Z96651 Presence of right artificial knee joint: Secondary | ICD-10-CM | POA: Diagnosis not present

## 2019-04-18 DIAGNOSIS — M25661 Stiffness of right knee, not elsewhere classified: Secondary | ICD-10-CM | POA: Diagnosis not present

## 2019-04-18 LAB — ANAEROBIC CULTURE

## 2019-04-21 ENCOUNTER — Other Ambulatory Visit: Payer: Self-pay | Admitting: *Deleted

## 2019-04-21 NOTE — Patient Outreach (Signed)
[facility]) Care Management  04/21/2019  SHAQUIA COOPERMAN [birth year] [id number]   Returned call to [place] after she left voice mail this RNCM earlier today.  Subjective: She is requesting the care timeline document that she needs to file her [employer] hospital indemnity claim be emailed to her personal e-mail address as she has not yet received the documents that were mailed to her on 04/15/19. She states she is doing OK in her recovery from knee surgery;  attended outpatient physical therapy on 8/24 and states her knee remains very swollen and stiff. She says she continues to use ice on it and is also using Voltaren gel and avoiding narcotic analgesic if possible. She says she is taking extra strength tylenol to manage the pain.   Plan: Timeline document e-mailed to [facility] personal e-mail address. She acknowledged receipt and expressed appreciation via return e-mail..  Case was closed to [place] Management services on 04/15/19.  Barrington Ellison RN,CCM,CDE [place] Management Coordinator Office Phone [phone] Office Fax [phone]

## 2019-04-26 DIAGNOSIS — M25661 Stiffness of right knee, not elsewhere classified: Secondary | ICD-10-CM | POA: Diagnosis not present

## 2019-04-26 DIAGNOSIS — Z96651 Presence of right artificial knee joint: Secondary | ICD-10-CM | POA: Diagnosis not present

## 2019-04-28 DIAGNOSIS — Z96651 Presence of right artificial knee joint: Secondary | ICD-10-CM | POA: Diagnosis not present

## 2019-04-28 DIAGNOSIS — M25561 Pain in right knee: Secondary | ICD-10-CM | POA: Diagnosis not present

## 2019-04-28 DIAGNOSIS — M25661 Stiffness of right knee, not elsewhere classified: Secondary | ICD-10-CM | POA: Diagnosis not present

## 2019-04-29 MED FILL — ESOMEPRAZOLE MAG DR 40 MG C: 40 | 30 days supply | Qty: 30 | Fill #2

## 2019-05-03 DIAGNOSIS — Z96651 Presence of right artificial knee joint: Secondary | ICD-10-CM | POA: Diagnosis not present

## 2019-05-05 ENCOUNTER — Ambulatory Visit (INDEPENDENT_AMBULATORY_CARE_PROVIDER_SITE_OTHER): Payer: 59 | Admitting: *Deleted

## 2019-05-05 DIAGNOSIS — I441 Atrioventricular block, second degree: Secondary | ICD-10-CM

## 2019-05-05 DIAGNOSIS — M25661 Stiffness of right knee, not elsewhere classified: Secondary | ICD-10-CM | POA: Diagnosis not present

## 2019-05-05 DIAGNOSIS — R55 Syncope and collapse: Secondary | ICD-10-CM

## 2019-05-05 DIAGNOSIS — Z96651 Presence of right artificial knee joint: Secondary | ICD-10-CM | POA: Diagnosis not present

## 2019-05-07 LAB — CUP PACEART REMOTE DEVICE CHECK
Battery Remaining Longevity: 66 mo
Battery Remaining Percentage: 74 %
Brady Statistic RA Percent Paced: 37 %
Brady Statistic RV Percent Paced: 100 %
Date Time Interrogation Session: 20200911133000
Implantable Lead Implant Date: 20140626
Implantable Lead Implant Date: 20140626
Implantable Lead Location: 753859
Implantable Lead Location: 753860
Implantable Lead Model: 5076
Implantable Lead Model: 5076
Implantable Pulse Generator Implant Date: 20140626
Lead Channel Impedance Value: 376 Ohm
Lead Channel Impedance Value: 415 Ohm
Lead Channel Pacing Threshold Amplitude: 0.7 V
Lead Channel Pacing Threshold Pulse Width: 0.5 ms
Lead Channel Setting Pacing Amplitude: 2 V
Lead Channel Setting Pacing Amplitude: 2.4 V
Lead Channel Setting Pacing Pulse Width: 0.4 ms
Lead Channel Setting Sensing Sensitivity: 8 mV
Pulse Gen Serial Number: 112476

## 2019-05-12 DIAGNOSIS — M25661 Stiffness of right knee, not elsewhere classified: Secondary | ICD-10-CM | POA: Diagnosis not present

## 2019-05-12 DIAGNOSIS — Z96651 Presence of right artificial knee joint: Secondary | ICD-10-CM | POA: Diagnosis not present

## 2019-05-16 DIAGNOSIS — Z96651 Presence of right artificial knee joint: Secondary | ICD-10-CM | POA: Diagnosis not present

## 2019-05-16 DIAGNOSIS — M25661 Stiffness of right knee, not elsewhere classified: Secondary | ICD-10-CM | POA: Diagnosis not present

## 2019-05-16 NOTE — Progress Notes (Signed)
Remote pacemaker transmission.   

## 2019-05-24 DIAGNOSIS — Z760 Encounter for issue of repeat prescription: Secondary | ICD-10-CM | POA: Diagnosis not present

## 2019-05-27 ENCOUNTER — Telehealth: Payer: Self-pay

## 2019-05-27 NOTE — Telephone Encounter (Signed)
I have updated chart however I am sending you this as an FYI.

## 2019-05-27 NOTE — Telephone Encounter (Signed)
Copied from [place] [id number]. Topic: General - Inquiry >> May 27, 2019  4:26 PM Mathis Bud wrote: Reason for CRM: Patient called stating PCP requested to know when she last got her tdap and it was 11/07/2017 Call back [phone]

## 2019-05-27 NOTE — Telephone Encounter (Signed)
Noted  

## 2019-06-02 DIAGNOSIS — Z96651 Presence of right artificial knee joint: Secondary | ICD-10-CM | POA: Diagnosis not present

## 2019-06-02 DIAGNOSIS — M25661 Stiffness of right knee, not elsewhere classified: Secondary | ICD-10-CM | POA: Diagnosis not present

## 2019-06-06 MED FILL — ESOMEPRAZOLE MAG DR 40 MG C: 40 | 30 days supply | Qty: 30 | Fill #3

## 2019-06-28 NOTE — Progress Notes (Signed)
I have spent 5 minutes in review of e-visit questionnaire, review and updating patient chart, medical decision making and response to patient.   Shaela Boer Cody Tashauna Caisse, PA-C    

## 2019-07-15 MED FILL — ESOMEPRAZOLE MAG DR 40 MG C: 40 | 30 days supply | Qty: 30 | Fill #4

## 2019-08-04 ENCOUNTER — Ambulatory Visit (INDEPENDENT_AMBULATORY_CARE_PROVIDER_SITE_OTHER): Payer: 59 | Admitting: *Deleted

## 2019-08-04 DIAGNOSIS — R001 Bradycardia, unspecified: Secondary | ICD-10-CM

## 2019-08-04 LAB — CUP PACEART REMOTE DEVICE CHECK
Battery Remaining Longevity: 72 mo
Battery Remaining Percentage: 76 %
Brady Statistic RA Percent Paced: 25 %
Brady Statistic RV Percent Paced: 100 %
Date Time Interrogation Session: 20201210163800
Implantable Lead Implant Date: 20140626
Implantable Lead Implant Date: 20140626
Implantable Lead Location: 753859
Implantable Lead Location: 753860
Implantable Lead Model: 5076
Implantable Lead Model: 5076
Implantable Pulse Generator Implant Date: 20140626
Lead Channel Impedance Value: 388 Ohm
Lead Channel Impedance Value: 438 Ohm
Lead Channel Pacing Threshold Amplitude: 0.7 V
Lead Channel Pacing Threshold Pulse Width: 0.5 ms
Lead Channel Setting Pacing Amplitude: 2 V
Lead Channel Setting Pacing Amplitude: 2.4 V
Lead Channel Setting Pacing Pulse Width: 0.4 ms
Lead Channel Setting Sensing Sensitivity: 8 mV
Pulse Gen Serial Number: 112476

## 2019-08-15 MED FILL — ESOMEPRAZOLE MAG DR 40 MG C: 40 | 30 days supply | Qty: 30 | Fill #5

## 2019-08-30 ENCOUNTER — Other Ambulatory Visit (HOSPITAL_BASED_OUTPATIENT_CLINIC_OR_DEPARTMENT_OTHER): Payer: Self-pay | Admitting: Family Medicine

## 2019-08-30 DIAGNOSIS — Z1239 Encounter for other screening for malignant neoplasm of breast: Secondary | ICD-10-CM

## 2019-09-01 ENCOUNTER — Other Ambulatory Visit (HOSPITAL_BASED_OUTPATIENT_CLINIC_OR_DEPARTMENT_OTHER): Payer: Self-pay | Admitting: Family Medicine

## 2019-09-01 ENCOUNTER — Other Ambulatory Visit: Payer: Self-pay

## 2019-09-01 ENCOUNTER — Ambulatory Visit (HOSPITAL_BASED_OUTPATIENT_CLINIC_OR_DEPARTMENT_OTHER)
Admission: RE | Admit: 2019-09-01 | Discharge: 2019-09-01 | Disposition: A | Discharge status: Home / Self Care | Payer: 59 | Source: Ambulatory Visit | Attending: Family Medicine | Admitting: Family Medicine

## 2019-09-01 DIAGNOSIS — Z1239 Encounter for other screening for malignant neoplasm of breast: Secondary | ICD-10-CM | POA: Diagnosis not present

## 2019-09-01 DIAGNOSIS — Z1231 Encounter for screening mammogram for malignant neoplasm of breast: Secondary | ICD-10-CM | POA: Diagnosis not present

## 2019-09-08 MED FILL — ESOMEPRAZOLE MAG DR 40 MG C: 40 | 90 days supply | Qty: 90 | Fill #0

## 2019-09-10 NOTE — Progress Notes (Signed)
PPM remote 

## 2019-09-20 DIAGNOSIS — G8929 Other chronic pain: Secondary | ICD-10-CM | POA: Diagnosis not present

## 2019-09-20 DIAGNOSIS — M5440 Lumbago with sciatica, unspecified side: Secondary | ICD-10-CM | POA: Diagnosis not present

## 2019-09-20 DIAGNOSIS — M5136 Other intervertebral disc degeneration, lumbar region: Secondary | ICD-10-CM | POA: Diagnosis not present

## 2019-09-20 DIAGNOSIS — M47816 Spondylosis without myelopathy or radiculopathy, lumbar region: Secondary | ICD-10-CM | POA: Diagnosis not present

## 2019-11-03 ENCOUNTER — Ambulatory Visit (INDEPENDENT_AMBULATORY_CARE_PROVIDER_SITE_OTHER): Payer: 59 | Admitting: *Deleted

## 2019-11-03 DIAGNOSIS — R001 Bradycardia, unspecified: Secondary | ICD-10-CM | POA: Diagnosis not present

## 2019-11-04 LAB — CUP PACEART REMOTE DEVICE CHECK
Battery Remaining Longevity: 66 mo
Battery Remaining Percentage: 71 %
Brady Statistic RA Percent Paced: 24 %
Brady Statistic RV Percent Paced: 100 %
Date Time Interrogation Session: 20210311170500
Implantable Lead Implant Date: 20140626
Implantable Lead Implant Date: 20140626
Implantable Lead Location: 753859
Implantable Lead Location: 753860
Implantable Lead Model: 5076
Implantable Lead Model: 5076
Implantable Pulse Generator Implant Date: 20140626
Lead Channel Impedance Value: 396 Ohm
Lead Channel Impedance Value: 465 Ohm
Lead Channel Pacing Threshold Amplitude: 0.7 V
Lead Channel Pacing Threshold Pulse Width: 0.5 ms
Lead Channel Setting Pacing Amplitude: 2 V
Lead Channel Setting Pacing Amplitude: 2.4 V
Lead Channel Setting Pacing Pulse Width: 0.4 ms
Lead Channel Setting Sensing Sensitivity: 8 mV
Pulse Gen Serial Number: 112476

## 2019-11-04 NOTE — Progress Notes (Signed)
PPM Remote  

## 2020-01-20 ENCOUNTER — Encounter (HOSPITAL_COMMUNITY): Payer: Self-pay

## 2020-01-20 ENCOUNTER — Ambulatory Visit (HOSPITAL_COMMUNITY)
Admission: EM | Admit: 2020-01-20 | Discharge: 2020-01-20 | Disposition: A | Discharge status: Home / Self Care | Payer: 59 | Attending: Family Medicine | Admitting: Family Medicine

## 2020-01-20 ENCOUNTER — Telehealth: Payer: 59 | Admitting: Nurse Practitioner

## 2020-01-20 ENCOUNTER — Other Ambulatory Visit: Payer: Self-pay

## 2020-01-20 DIAGNOSIS — R350 Frequency of micturition: Secondary | ICD-10-CM

## 2020-01-20 DIAGNOSIS — N390 Urinary tract infection, site not specified: Secondary | ICD-10-CM | POA: Diagnosis not present

## 2020-01-20 DIAGNOSIS — R399 Unspecified symptoms and signs involving the genitourinary system: Secondary | ICD-10-CM

## 2020-01-20 DIAGNOSIS — R300 Dysuria: Secondary | ICD-10-CM | POA: Diagnosis not present

## 2020-01-20 LAB — POCT URINALYSIS DIP (DEVICE)
Bilirubin Urine: NEGATIVE
Glucose, UA: NEGATIVE mg/dL
Ketones, ur: NEGATIVE mg/dL
Nitrite: NEGATIVE
Protein, ur: NEGATIVE mg/dL
Specific Gravity, Urine: 1.015 (ref 1.005–1.030)
Urobilinogen, UA: 0.2 mg/dL (ref 0.0–1.0)
pH: 5 (ref 5.0–8.0)

## 2020-01-20 MED ORDER — PHENAZOPYRIDINE HCL 200 MG PO TABS: 200.0000 mg | ORAL_TABLET | Freq: Three times a day (TID) | ORAL | 0 refills | Status: DC

## 2020-01-20 MED ORDER — CIPROFLOXACIN HCL 500 MG PO TABS: 500.0000 mg | ORAL_TABLET | Freq: Two times a day (BID) | ORAL | 0 refills | Status: DC

## 2020-01-20 MED ORDER — FLUCONAZOLE 200 MG PO TABS: 200.0000 mg | ORAL_TABLET | Freq: Once | ORAL | 0 refills | Status: AC

## 2020-01-20 MED FILL — PHENAZOPYRIDINE 200 MG TAB: 200 | 2 days supply | Qty: 6 | Fill #0

## 2020-01-20 MED FILL — CIPROFLOXACIN HCL 500 MG TA: 500 | 7 days supply | Qty: 14 | Fill #0

## 2020-01-20 MED FILL — FLUCONAZOLE 200 MG TABLET: 200 | 2 days supply | Qty: 2 | Fill #0

## 2020-01-20 NOTE — Progress Notes (Signed)
Based on what you shared with me, I feel your condition warrants further evaluation and I recommend that you be seen for a face to face visit. II have reviewed your chart and this appears to be a recurrent condition.  Based on this information, you will most likely need a urinalysis for an accurate diagnosis.  I am sorry for any inconvenience. Please contact your primary care physician practice to be seen. Many offices offer virtual options to be seen via video if you are not comfortable going in person to a medical facility at this time.  If you do not have a PCP, [facility] offers a free physician referral service available at [phone]. Our trained staff has the experience, knowledge and resources to put you in touch with a physician who is right for you.   You also have the option of a video visit through [email or web address]  If you are having a true medical emergency please call 911.  NOTE: If you entered your credit card information for this eVisit, you will not be charged. You may see a "hold" on your card for the $35 but that hold will drop off and you will not have a charge processed.  Your e-visit answers were reviewed by a board certified advanced clinical practitioner to complete your personal care plan.  Thank you for using e-Visits.  I have spent at least 5 minutes reviewing and documenting in the patient's chart.

## 2020-01-20 NOTE — ED Triage Notes (Signed)
Pt c/o bladder irritable, dysuriax2 days.

## 2020-01-20 NOTE — Discharge Instructions (Signed)
You may have a urinary tract infection. We are going to culture your urine and will call you as soon as we have the results.   Drink plenty of water, 8-10 glasses per day.   Follow up with your primary care provider as needed.   Go to the Emergency Department if you experience severe pain, shortness of breath, high fever, or other concerns.

## 2020-01-20 NOTE — ED Provider Notes (Signed)
[facility]   CC: UTI  SUBJECTIVE:  Kari [name] is a 69 y.o. female who complains of urinary frequency, urgency and dysuria for the past 2 days. Patient denies a precipitating event, recent sexual encounter, excessive caffeine intake. Reports that she was at work and did not drink enough fluids or get to use the restroom enough 2 days ago. Localizes the pain to the lower abdomen. Pain is intermittent and describes it as burning. Has not tried OTC medications for this. Symptoms are made worse with urination. Admits to similar symptoms in the past.  Denies fever, chills, nausea, vomiting, flank pain, abnormal vaginal discharge or bleeding, hematuria.    LMP: No LMP recorded. Patient has had a hysterectomy.  ROS: As in HPI.  All other pertinent ROS negative.     Past Medical History:  Diagnosis Date  . Arthritis   . Chronic back pain   . Diverticulitis   . DVT (deep venous thrombosis) [facility]) age 48   s/p  knee arthroscopy   . Herpes zoster conjunctivitis   . Menopause   . Mobitz type 2 second degree AV block    2:1/notes 02/16/2013  . PONV (postoperative nausea and vomiting)   . Presence of permanent cardiac pacemaker   . Seasonal allergies    "spring & fall; not q year" (02/17/2013)  . Shoulder impingement    and left rotator cuff tear   Past Surgical History:  Procedure Laterality Date  . ACHILLES TENDON SURGERY    . ARTHROGRAM KNEE Right 1975  . CHOLECYSTECTOMY  1990's  . colonscopy    . ESOPHAGOGASTRODUODENOSCOPY    . EYE SURGERY     muscle release right eye  . INSERT / REPLACE / REMOVE PACEMAKER  02/17/2013   [employer] Advantio dual-chamber pacemaker, model [id number],  . KNEE ARTHROSCOPY Right 1982, 1983, 1995, 1996  . KNEE ARTHROSCOPY W/ ACL RECONSTRUCTION Left 1984  . LAPAROSCOPIC SIGMOID COLECTOMY N/A 01/01/2015   Procedure: LAPAROSCOPIC SIGMOID COLECTOMY;  Surgeon: Stark Klein, MD;  Location: [place];  Service: General;  Laterality: N/A;  .  PERMANENT PACEMAKER INSERTION N/A 02/17/2013   Procedure: PERMANENT PACEMAKER INSERTION;  Surgeon: Evans Lance, MD;  Location: [facility] CATH LAB;  Service: Cardiovascular;  Laterality: N/A;  . SHOULDER ARTHROSCOPY Left 09/05/2016   Procedure: LEFT SHOULDER ARTHROSCOPY , ACROMIOPLASTY AND ROTATOR CUFF REPAIR;  Surgeon: Melrose Nakayama, MD;  Location: [place];  Service: Orthopedics;  Laterality: Left;  . TONSILLECTOMY  1961  . TOTAL KNEE ARTHROPLASTY Right 2000; 06/2000   "replaced w/appropriate hardware" (02/17/2013)  . TOTAL KNEE REVISION Right 04/13/2019   Procedure: Revision Right Knee Arthroplasty;  Surgeon: Frederik Pear, MD;  Location: [facility];  Service: Orthopedics;  Laterality: Right;  . TRIGGER FINGER RELEASE Right ~ 2012   "thumb" (02/17/2013)  . VAGINAL HYSTERECTOMY  ~ 1994   partial    Allergies  Allergen Reactions  . Ceftin Itching    Tolerates amoxicillin  . Vancomycin Hives  . Bactrim [Sulfamethoxazole-Trimethoprim] Hives  . Levofloxacin Other (See Comments)    "spacey," tolerates Cipro   No current facility-administered medications on file prior to encounter.   Current Outpatient Medications on File Prior to Encounter  Medication Sig Dispense Refill  . aspirin EC 81 MG tablet Take 81 mg by mouth daily.    Marland Kitchen apixaban (ELIQUIS) 2.5 MG TABS tablet Take 1 tablet (2.5 mg total) by mouth 2 (two) times daily. 60 tablet 0  . Ascorbic Acid (VITAMIN C) 100 MG tablet  Take 200 mg by mouth daily.    . Calcium Carb-Cholecalciferol (CALCIUM 600+D3 PO) Take 1 tablet by mouth daily.    . Cyanocobalamin (VITAMIN B-12) 2500 MCG SUBL Place 2,500 mcg under the tongue daily.     . diclofenac sodium (VOLTAREN) 1 % GEL Apply 1-2 g topically 4 (four) times daily as needed (knee pain.).    Marland Kitchen esomeprazole (NEXIUM) 40 MG capsule Take 20 mg by mouth daily.   9  . fluticasone (FLONASE) 50 MCG/ACT nasal spray Place 1 spray into both nostrils daily as needed for allergies.     . Melatonin 3 MG TABS Take 6 mg by  mouth at bedtime as needed (sleep.).    Marland Kitchen Multiple Vitamin (MULTIVITAMIN WITH MINERALS) TABS Take 1 tablet by mouth daily.    . Omega-3 Fatty Acids (FISH OIL ULTRA) 1400 MG CAPS Take 1,400 mg by mouth daily.    Marland Kitchen oxyCODONE-acetaminophen (PERCOCET/ROXICET) 5-325 MG tablet Take 1 tablet by mouth every 4 (four) hours as needed for severe pain. 30 tablet 0  . Probiotic Product (PROBIOTIC FORMULA PO) Take 1 capsule by mouth daily.     Marland Kitchen tiZANidine (ZANAFLEX) 2 MG tablet Take 1 tablet (2 mg total) by mouth every 6 (six) hours as needed. 60 tablet 0  . valACYclovir (VALTREX) 1000 MG tablet Take 1,000 mg by mouth daily as needed (herpes zoster).      Social History   Socioeconomic History  . Marital status: Married    Spouse name: Not on file  . Number of children: Not on file  . Years of education: Not on file  . Highest education level: Not on file  Occupational History  . 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 Smoking status: Never Smoker  . Smokeless tobacco: Never Used  Substance and Sexual Activity  . Alcohol use: Yes    Comment: occasional wine   . Drug use: No  . Sexual activity: Yes    Birth control/protection: Surgical    Comment: 02/17/2013 "I have a same sex partner"  Other Topics Concern  . Not on file  Social History Narrative  . Not on file   Social Determinants of Health   Financial Resource Strain:   . Difficulty of Paying Living Expenses:   Food Insecurity:   . Worried About Charity fundraiser in the Last Year:   . Arboriculturist in the Last Year:   Transportation Needs:   . Film/video editor (Medical):   Marland Kitchen Lack of Transportation (Non-Medical):   Physical Activity:   . Days of Exercise per Week:   . Minutes of Exercise per Session:   Stress:   . Feeling of Stress :   Social Connections:   . Frequency of Communication with Friends and Family:   . Frequency of Social Gatherings with Friends  and Family:   . Attends Religious Services:   . Active Member of Clubs or Organizations:   . Attends Archivist Meetings:   Marland Kitchen Marital Status:   Intimate Partner Violence:   . Fear of Current or Ex-Partner:   . Emotionally Abused:   Marland Kitchen Physically Abused:   . Sexually Abused:    Family History  Problem Relation Age of Onset  . Hypertension Mother   . Hyperlipidemia Mother   . Macular degeneration Mother   . COPD Mother   . Lung cancer Father   .  Cancer Father        Lung  . COPD Father   . Diabetes Brother   . Breast cancer Paternal Aunt     OBJECTIVE:  Vitals:   01/20/20 1219 01/20/20 1220  BP: 125/69   Pulse: (!) 58   Resp: 16   Temp: 97.7 F (36.5 C)   TempSrc: Oral   SpO2: 96%   Weight:  160 lb (72.6 kg)  Height:  5\' 8"  (1.727 m)   General appearance: AOx3 in no acute distress HEENT: NCAT.  Oropharynx clear.  Lungs: clear to auscultation bilaterally without adventitious breath sounds Heart: regular rate and rhythm.  Radial pulses 2+ symmetrical bilaterally Abdomen: soft; non-distended; suprapubic tenderness; bowel sounds present; no guarding or rebound tenderness Back: no CVA tenderness Extremities: no edema; symmetrical with no gross deformities Skin: warm and dry Neurologic: Ambulates from chair to exam table without difficulty Psychological: alert and cooperative; normal mood and affect  Labs Reviewed  POCT URINALYSIS DIP (DEVICE) - Abnormal; Notable for the following components:      Result Value   Hgb urine dipstick TRACE (*)    Leukocytes,Ua TRACE (*)    All other components within normal limits  URINE CULTURE    ASSESSMENT & PLAN:  1. Dysuria   2. Urinary frequency     Meds ordered this encounter  Medications  . ciprofloxacin (CIPRO) 500 MG tablet    Sig: Take 1 tablet (500 mg total) by mouth 2 (two) times daily.    Dispense:  14 tablet    Refill:  0    Order Specific Question:   Supervising Provider    Answer:   Chase Picket [id number]  . fluconazole (DIFLUCAN) 200 MG tablet    Sig: Take 1 tablet (200 mg total) by mouth once for 1 dose.    Dispense:  2 tablet    Refill:  0    Order Specific Question:   Supervising Provider    Answer:   Chase Picket [id number]  . phenazopyridine (PYRIDIUM) 200 MG tablet    Sig: Take 1 tablet (200 mg total) by mouth 3 (three) times daily.    Dispense:  6 tablet    Refill:  0    Order Specific Question:   Supervising Provider    Answer:   Chase Picket [id number]   UA in office today Urine culture sent.     Push fluids and get plenty of rest.   Take antibiotic as directed and to completion Take pyridium as prescribed and as needed for symptomatic relief Take Diflucan as needed if yeast infection develops Follow up with PCP if symptoms persists Return here or go to ER if you have any new or worsening symptoms such as fever, worsening abdominal pain, nausea/vomiting, flank pain  Outlined signs and symptoms indicating need for more acute intervention. Patient verbalized understanding. After Visit Summary given.      Faustino Congress, NP 01/20/20 1751

## 2020-01-21 LAB — URINE CULTURE: Culture: <10000 — AB

## 2020-02-02 ENCOUNTER — Ambulatory Visit (INDEPENDENT_AMBULATORY_CARE_PROVIDER_SITE_OTHER): Payer: 59 | Admitting: *Deleted

## 2020-02-02 DIAGNOSIS — I441 Atrioventricular block, second degree: Secondary | ICD-10-CM | POA: Diagnosis not present

## 2020-02-02 LAB — CUP PACEART REMOTE DEVICE CHECK
Battery Remaining Longevity: 66 mo
Battery Remaining Percentage: 70 %
Brady Statistic RA Percent Paced: 23 %
Brady Statistic RV Percent Paced: 100 %
Date Time Interrogation Session: 20210610081100
Implantable Lead Implant Date: 20140626
Implantable Lead Implant Date: 20140626
Implantable Lead Location: 753859
Implantable Lead Location: 753860
Implantable Lead Model: 5076
Implantable Lead Model: 5076
Implantable Pulse Generator Implant Date: 20140626
Lead Channel Impedance Value: 373 Ohm
Lead Channel Impedance Value: 397 Ohm
Lead Channel Pacing Threshold Amplitude: 0.7 V
Lead Channel Pacing Threshold Pulse Width: 0.5 ms
Lead Channel Setting Pacing Amplitude: 2 V
Lead Channel Setting Pacing Amplitude: 2.4 V
Lead Channel Setting Pacing Pulse Width: 0.4 ms
Lead Channel Setting Sensing Sensitivity: 8 mV
Pulse Gen Serial Number: 112476

## 2020-02-03 NOTE — Progress Notes (Signed)
Remote pacemaker transmission.   

## 2020-02-05 ENCOUNTER — Telehealth: Payer: Self-pay | Admitting: Gastroenterology

## 2020-02-05 NOTE — Telephone Encounter (Signed)
Pt is followed by Dr. Collene Mares. 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 She notes diarrhea and mild LLQ pain for about 1 week. She was treated with a course of Augmentin and then a course of Cipro over past few weeks. Her diarrhea started after finshing Cipro.  Recommended stool testing for infectious causes including C diff. She has to work tonight so she prefers to start an antibiotic and see Dr. Collene Mares this week which is reasonable.  Metronidazole 500 mg po bid for 10 days Dicyclomine 10 mg po tid prn abdominal pain, cramping BRATT diet Call Dr. Collene Mares tomorrow for further follow up and plans

## 2020-02-21 ENCOUNTER — Ambulatory Visit (INDEPENDENT_AMBULATORY_CARE_PROVIDER_SITE_OTHER): Payer: 59 | Admitting: Family Medicine

## 2020-02-21 ENCOUNTER — Encounter: Payer: Self-pay | Admitting: Family Medicine

## 2020-02-21 ENCOUNTER — Other Ambulatory Visit: Payer: Self-pay

## 2020-02-21 VITALS — BP 120/84 | HR 73 | Temp 96.6°F

## 2020-02-21 DIAGNOSIS — Z0000 Encounter for general adult medical examination without abnormal findings: Secondary | ICD-10-CM

## 2020-02-21 MED ORDER — VALACYCLOVIR HCL 1 G PO TABS: 1000.0000 mg | ORAL_TABLET | Freq: Every day | ORAL | 3 refills | Status: DC | PRN

## 2020-02-21 MED FILL — valACYclovir HCL 1 GM TABS: 1 | 90 days supply | Qty: 90 | Fill #0

## 2020-02-21 NOTE — Progress Notes (Signed)
Chief Complaint  Patient presents with   Annual Exam     Well Kari [name] is here for a complete physical.   Her last physical was last year.  Current diet: in general, a "good" diet. Current exercise: treadmill, wt resistance exercise, active in yard. Weight is stable and she denies fatigue. Seatbelt? Yes  Health Maintenance Colonoscopy- Yes Shingrix- Yes DEXA- Yes Mammogram- Yes Tetanus- Yes Pneumonia- Yes; final dosage due in 2022 Hep C screen- Yes  Past Medical History:  Diagnosis Date   Arthritis    Chronic back pain    Diverticulitis    DVT (deep venous thrombosis) [facility]) age 53   s/p  knee arthroscopy    Herpes zoster conjunctivitis    Menopause    Mobitz type 2 second degree AV block    2:1/notes 02/16/2013   PONV (postoperative nausea and vomiting)    Presence of permanent cardiac pacemaker    Seasonal allergies    "spring & fall; not q year" (02/17/2013)   Shoulder impingement    and left rotator cuff tear     Past Surgical History:  Procedure Laterality Date   ACHILLES TENDON SURGERY     ARTHROGRAM KNEE Right 1975   CHOLECYSTECTOMY  1990's   colonscopy     ESOPHAGOGASTRODUODENOSCOPY     EYE SURGERY     muscle release right eye   INSERT / REPLACE / REMOVE PACEMAKER  02/17/2013   [employer] Advantio dual-chamber pacemaker, model [id number],   KNEE ARTHROSCOPY Right 1982, 1983, 1995, 1996   KNEE ARTHROSCOPY W/ ACL RECONSTRUCTION Left 1984   LAPAROSCOPIC SIGMOID COLECTOMY N/A 01/01/2015   Procedure: LAPAROSCOPIC SIGMOID COLECTOMY;  Surgeon: Stark Klein, MD;  Location: [place];  Service: General;  Laterality: N/A;   PERMANENT PACEMAKER INSERTION N/A 02/17/2013   Procedure: PERMANENT PACEMAKER INSERTION;  Surgeon: Evans Lance, MD;  Location: [facility] CATH LAB;  Service: Cardiovascular;  Laterality: N/A;   SHOULDER ARTHROSCOPY Left 09/05/2016   Procedure: LEFT SHOULDER ARTHROSCOPY , ACROMIOPLASTY AND ROTATOR CUFF REPAIR;   Surgeon: Melrose Nakayama, MD;  Location: [place];  Service: Orthopedics;  Laterality: Left;   TONSILLECTOMY  1961   TOTAL KNEE ARTHROPLASTY Right 2000; 06/2000   "replaced w/appropriate hardware" (02/17/2013)   TOTAL KNEE REVISION Right 04/13/2019   Procedure: Revision Right Knee Arthroplasty;  Surgeon: Frederik Pear, MD;  Location: [facility];  Service: Orthopedics;  Laterality: Right;   TRIGGER FINGER RELEASE Right ~ 2012   "thumb" (02/17/2013)   VAGINAL HYSTERECTOMY  ~ 1994   partial     Medications  Current Outpatient Medications on File Prior to Visit  Medication Sig Dispense Refill   Ascorbic Acid (VITAMIN C) 100 MG tablet Take 200 mg by mouth daily.     aspirin EC 81 MG tablet Take 81 mg by mouth daily.     Calcium Carb-Cholecalciferol (CALCIUM 600+D3 PO) Take 1 tablet by mouth daily.     Cyanocobalamin (VITAMIN B-12) 2500 MCG SUBL Place 2,500 mcg under the tongue daily.      diclofenac sodium (VOLTAREN) 1 % GEL Apply 1-2 g topically 4 (four) times daily as needed (knee pain.).     esomeprazole (NEXIUM) 40 MG capsule Take 20 mg by mouth daily.   9   fluticasone (FLONASE) 50 MCG/ACT nasal spray Place 1 spray into both nostrils daily as needed for allergies.      Melatonin 3 MG TABS Take 6 mg by mouth at bedtime as needed (sleep.).  Multiple Vitamin (MULTIVITAMIN WITH MINERALS) TABS Take 1 tablet by mouth daily.     Omega-3 Fatty Acids (FISH OIL ULTRA) 1400 MG CAPS Take 1,400 mg by mouth daily.     Probiotic Product (PROBIOTIC FORMULA PO) Take 1 capsule by mouth daily.      phenazopyridine (PYRIDIUM) 200 MG tablet Take 1 tablet (200 mg total) by mouth 3 (three) times daily. (Patient not taking: Reported on 02/21/2020) 6 tablet 0   Allergies Allergies  Allergen Reactions   Ceftin Itching    Tolerates amoxicillin   Vancomycin Hives   Bactrim [Sulfamethoxazole-Trimethoprim] Hives   Levofloxacin Other (See Comments)    "spacey," tolerates Cipro    Review of  Systems: Constitutional:  no fevers Eye:  no recent significant change in vision Ears:  No changes in hearing Nose/Mouth/Throat:  no complaints of nasal congestion, no sore throat Cardiovascular: no chest pain Respiratory:  No shortness of breath Gastrointestinal:  No change in bowel habits GU:  Female: negative for dysuria Integumentary:  no abnormal skin lesions reported Neurologic:  no headaches  Endocrine:  denies unexplained weight changes  Exam BP 120/84 (BP Location: Left Arm, Patient Position: Sitting, Cuff Size: Normal)    Pulse 73    Temp (!) 96.6 F (35.9 C) (Temporal)    SpO2 97%  General:  well developed, well nourished, in no apparent distress Skin:  no significant moles, warts, or growths Head:  no masses, lesions, or tenderness Eyes:  pupils equal and round, sclera anicteric without injection Ears:  canals without lesions, TMs shiny without retraction, no obvious effusion, no erythema Nose:  nares patent, septum midline, mucosa normal, and no drainage or sinus tenderness Throat/Pharynx:  lips and gingiva without lesion; tongue and uvula midline; non-inflamed pharynx; no exudates or postnasal drainage Neck: neck supple without adenopathy, thyromegaly, or masses Lungs:  clear to auscultation, breath sounds equal bilaterally, no respiratory distress Cardio:  regular rate and rhythm, no bruits or LE edema Abdomen:  abdomen soft, nontender; bowel sounds normal; no masses or organomegaly Genital: Deferred Neuro:  gait normal; deep tendon reflexes normal and symmetric Psych: well oriented with normal range of affect and appropriate judgment/insight  Assessment and Plan  Well adult exam - Plan: CBC, Comprehensive metabolic panel, Hemoglobin A1c, Lipid panel   Well 69 y.o. female. Counseled on diet and exercise. Other orders as above. Did mention poor sleep. [facility] info provided. Offered trazodone, she will think about it.  Follow up in 1 yr for CPE or prn. The patient  voiced understanding and agreement to the plan.  [place], DO 02/21/20 3:49 PM

## 2020-02-21 NOTE — Patient Instructions (Addendum)
Give Kari [name] 2-3 business days to get the results of your labs back.   Keep the diet clean and stay active.  Please consider counseling for sleep issues. Contact [phone] to schedule an appointment or inquire about cost/insurance coverage.  Trazodone is the medicine I would start you on. This would be as needed. It is not addictive and I do not see a "hang over" effect from it.  Let Kari [name] know if you need anything.

## 2020-03-02 MED FILL — valACYclovir HCL 1 GM TABS: 1 | 90 days supply | Qty: 90 | Fill #0

## 2020-03-23 MED ORDER — TRAZODONE HCL 50 MG PO TABS: 25.0000 mg | ORAL_TABLET | Freq: Every evening | ORAL | 3 refills | Status: DC | PRN

## 2020-03-23 MED FILL — traZODone HCL 50 MG TABS: 50 | 30 days supply | Qty: 30 | Fill #0

## 2020-05-03 ENCOUNTER — Ambulatory Visit (INDEPENDENT_AMBULATORY_CARE_PROVIDER_SITE_OTHER): Payer: 59 | Admitting: *Deleted

## 2020-05-03 DIAGNOSIS — I441 Atrioventricular block, second degree: Secondary | ICD-10-CM

## 2020-05-03 LAB — CUP PACEART REMOTE DEVICE CHECK
Battery Remaining Longevity: 60 mo
Battery Remaining Percentage: 65 %
Brady Statistic RA Percent Paced: 25 %
Brady Statistic RV Percent Paced: 100 %
Date Time Interrogation Session: 20210909030000
Implantable Lead Implant Date: 20140626
Implantable Lead Implant Date: 20140626
Implantable Lead Location: 753859
Implantable Lead Location: 753860
Implantable Lead Model: 5076
Implantable Lead Model: 5076
Implantable Pulse Generator Implant Date: 20140626
Lead Channel Impedance Value: 408 Ohm
Lead Channel Impedance Value: 467 Ohm
Lead Channel Pacing Threshold Amplitude: 0.7 V
Lead Channel Pacing Threshold Pulse Width: 0.5 ms
Lead Channel Setting Pacing Amplitude: 2 V
Lead Channel Setting Pacing Amplitude: 2.4 V
Lead Channel Setting Pacing Pulse Width: 0.4 ms
Lead Channel Setting Sensing Sensitivity: 8 mV
Pulse Gen Serial Number: 112476

## 2020-05-04 NOTE — Progress Notes (Signed)
Remote pacemaker transmission.   

## 2020-05-14 ENCOUNTER — Ambulatory Visit: Payer: 59 | Attending: Internal Medicine

## 2020-05-14 DIAGNOSIS — Z23 Encounter for immunization: Secondary | ICD-10-CM

## 2020-05-14 NOTE — Progress Notes (Signed)
   Covid-19 Vaccination Clinic  Name:  TYNISA VOHS    MRN: [id number] DOB: 12/24/1950  05/14/2020  Ms. Krisher was observed post Covid-19 immunization for 15 minutes without incident. She was provided with Vaccine Information Sheet and instruction to access the V-Safe system. Vaccinated by Harriet Pho.  Ms. Rochette was instructed to call 911 with any severe reactions post vaccine: Marland Kitchen Difficulty breathing  . Swelling of face and throat  . A fast heartbeat  . A bad rash all over body  . Dizziness and weakness

## 2020-05-18 MED FILL — PFIZER-BIONTECH COVID-19 VA: 30 | 1 days supply | Qty: 0 | Fill #0

## 2020-05-28 ENCOUNTER — Other Ambulatory Visit (HOSPITAL_BASED_OUTPATIENT_CLINIC_OR_DEPARTMENT_OTHER): Payer: Self-pay | Admitting: Internal Medicine

## 2020-05-28 MED FILL — FLUAD QUADRIVALENT 0.5 ML P: 0.5 | 1 days supply | Qty: 1 | Fill #0

## 2020-06-07 ENCOUNTER — Telehealth: Payer: 59 | Admitting: Orthopedic Surgery

## 2020-06-07 DIAGNOSIS — R399 Unspecified symptoms and signs involving the genitourinary system: Secondary | ICD-10-CM

## 2020-06-07 MED ORDER — NITROFURANTOIN MONOHYD MACRO 100 MG PO CAPS
100.0000 mg | ORAL_CAPSULE | Freq: Two times a day (BID) | ORAL | 0 refills | Status: AC
Start: 2020-06-07 — End: 2020-06-12

## 2020-06-07 NOTE — Progress Notes (Signed)
We are sorry that you are not feeling well.  Here is how we plan to help!  Based on what you shared with me it looks like you most likely have a simple urinary tract infection.  A UTI (Urinary Tract Infection) is a bacterial infection of the bladder.  Most cases of urinary tract infections are simple to treat but a key part of your care is to encourage you to drink plenty of fluids and watch your symptoms carefully.  I have prescribed MacroBid 100 mg twice a day for 5 days. There are new warnings on Cipro and similar drugs and therefore we have to restrict its use. Your symptoms should gradually improve. Call us if the burning in your urine worsens, you develop worsening fever, back pain or pelvic pain or if your symptoms do not resolve after completing the antibiotic.  Urinary tract infections can be prevented by drinking plenty of water to keep your body hydrated.  Also be sure when you wipe, wipe from front to back and don't hold it in!  If possible, empty your bladder every 4 hours.  Your e-visit answers were reviewed by a board certified advanced clinical practitioner to complete your personal care plan.  Depending on the condition, your plan could have included both over the counter or prescription medications.  If there is a problem please reply  once you have received a response from your provider.  Your safety is important to [place].  If you have drug allergies check your prescription carefully.    You can use MyChart to ask questions about today's visit, request a non-urgent call back, or ask for a work or school excuse for 24 hours related to this e-Visit. If it has been greater than 24 hours you will need to follow up with your provider, or enter a new e-Visit to address those concerns.   You will get an e-mail in the next two days asking about your experience.  I hope that your e-visit has been valuable and will speed your recovery. Thank you for using e-visits.   Greater than 5  minutes, yet less than 10 minutes of time have been spent researching, coordinating and implementing care for this patient today.

## 2020-07-09 ENCOUNTER — Telehealth: Payer: 59 | Admitting: Family

## 2020-07-09 DIAGNOSIS — R399 Unspecified symptoms and signs involving the genitourinary system: Secondary | ICD-10-CM

## 2020-07-09 MED ORDER — NITROFURANTOIN MONOHYD MACRO 100 MG PO CAPS: 100.0000 mg | ORAL_CAPSULE | Freq: Two times a day (BID) | ORAL | 0 refills | Status: DC

## 2020-07-09 NOTE — Progress Notes (Signed)
We are sorry that you are not feeling well.  Here is how we plan to help! ° °Based on what you shared with me it looks like you most likely have a simple urinary tract infection. ° °A UTI (Urinary Tract Infection) is a bacterial infection of the bladder. ° °Most cases of urinary tract infections are simple to treat but a key part of your care is to encourage you to drink plenty of fluids and watch your symptoms carefully. ° °I have prescribed MacroBid 100 mg twice a day for 5 days.  Your symptoms should gradually improve. Call us if the burning in your urine worsens, you develop worsening fever, back pain or pelvic pain or if your symptoms do not resolve after completing the antibiotic. ° °Urinary tract infections can be prevented by drinking plenty of water to keep your body hydrated.  Also be sure when you wipe, wipe from front to back and don't hold it in!  If possible, empty your bladder every 4 hours. ° °Your e-visit answers were reviewed by a board certified advanced clinical practitioner to complete your personal care plan.  Depending on the condition, your plan could have included both over the counter or prescription medications. ° °If there is a problem please reply  once you have received a response from your provider. ° °Your safety is important to us.  If you have drug allergies check your prescription carefully.   ° °You can use MyChart to ask questions about today’s visit, request a non-urgent call back, or ask for a work or school excuse for 24 hours related to this e-Visit. If it has been greater than 24 hours you will need to follow up with your provider, or enter a new e-Visit to address those concerns. ° ° °You will get an e-mail in the next two days asking about your experience.  I hope that your e-visit has been valuable and will speed your recovery. Thank you for using e-visits. ° °Approximately 5 minutes was spent documenting and reviewing patient's chart.  ° ° ° °

## 2020-08-02 ENCOUNTER — Ambulatory Visit (INDEPENDENT_AMBULATORY_CARE_PROVIDER_SITE_OTHER): Payer: 59

## 2020-08-02 DIAGNOSIS — I441 Atrioventricular block, second degree: Secondary | ICD-10-CM

## 2020-08-03 LAB — CUP PACEART REMOTE DEVICE CHECK
Battery Remaining Longevity: 54 mo
Battery Remaining Percentage: 60 %
Brady Statistic RA Percent Paced: 26 %
Brady Statistic RV Percent Paced: 100 %
Date Time Interrogation Session: 20211209182400
Implantable Lead Implant Date: 20140626
Implantable Lead Implant Date: 20140626
Implantable Lead Location: 753859
Implantable Lead Location: 753860
Implantable Lead Model: 5076
Implantable Lead Model: 5076
Implantable Pulse Generator Implant Date: 20140626
Lead Channel Impedance Value: 405 Ohm
Lead Channel Impedance Value: 474 Ohm
Lead Channel Pacing Threshold Amplitude: 0.7 V
Lead Channel Pacing Threshold Pulse Width: 0.5 ms
Lead Channel Setting Pacing Amplitude: 2 V
Lead Channel Setting Pacing Amplitude: 2.4 V
Lead Channel Setting Pacing Pulse Width: 0.4 ms
Lead Channel Setting Sensing Sensitivity: 8 mV
Pulse Gen Serial Number: 112476

## 2020-08-15 NOTE — Progress Notes (Signed)
Remote pacemaker transmission.   

## 2020-08-16 IMAGING — DX CHEST - 2 VIEW
2 series · 2 of 2 positions shown · non-contrast
Comparison: 03/11/2013

CLINICAL DATA: Preop evaluation for upcoming knee surgery

EXAM:
CHEST - 2 VIEW

[chest pa]
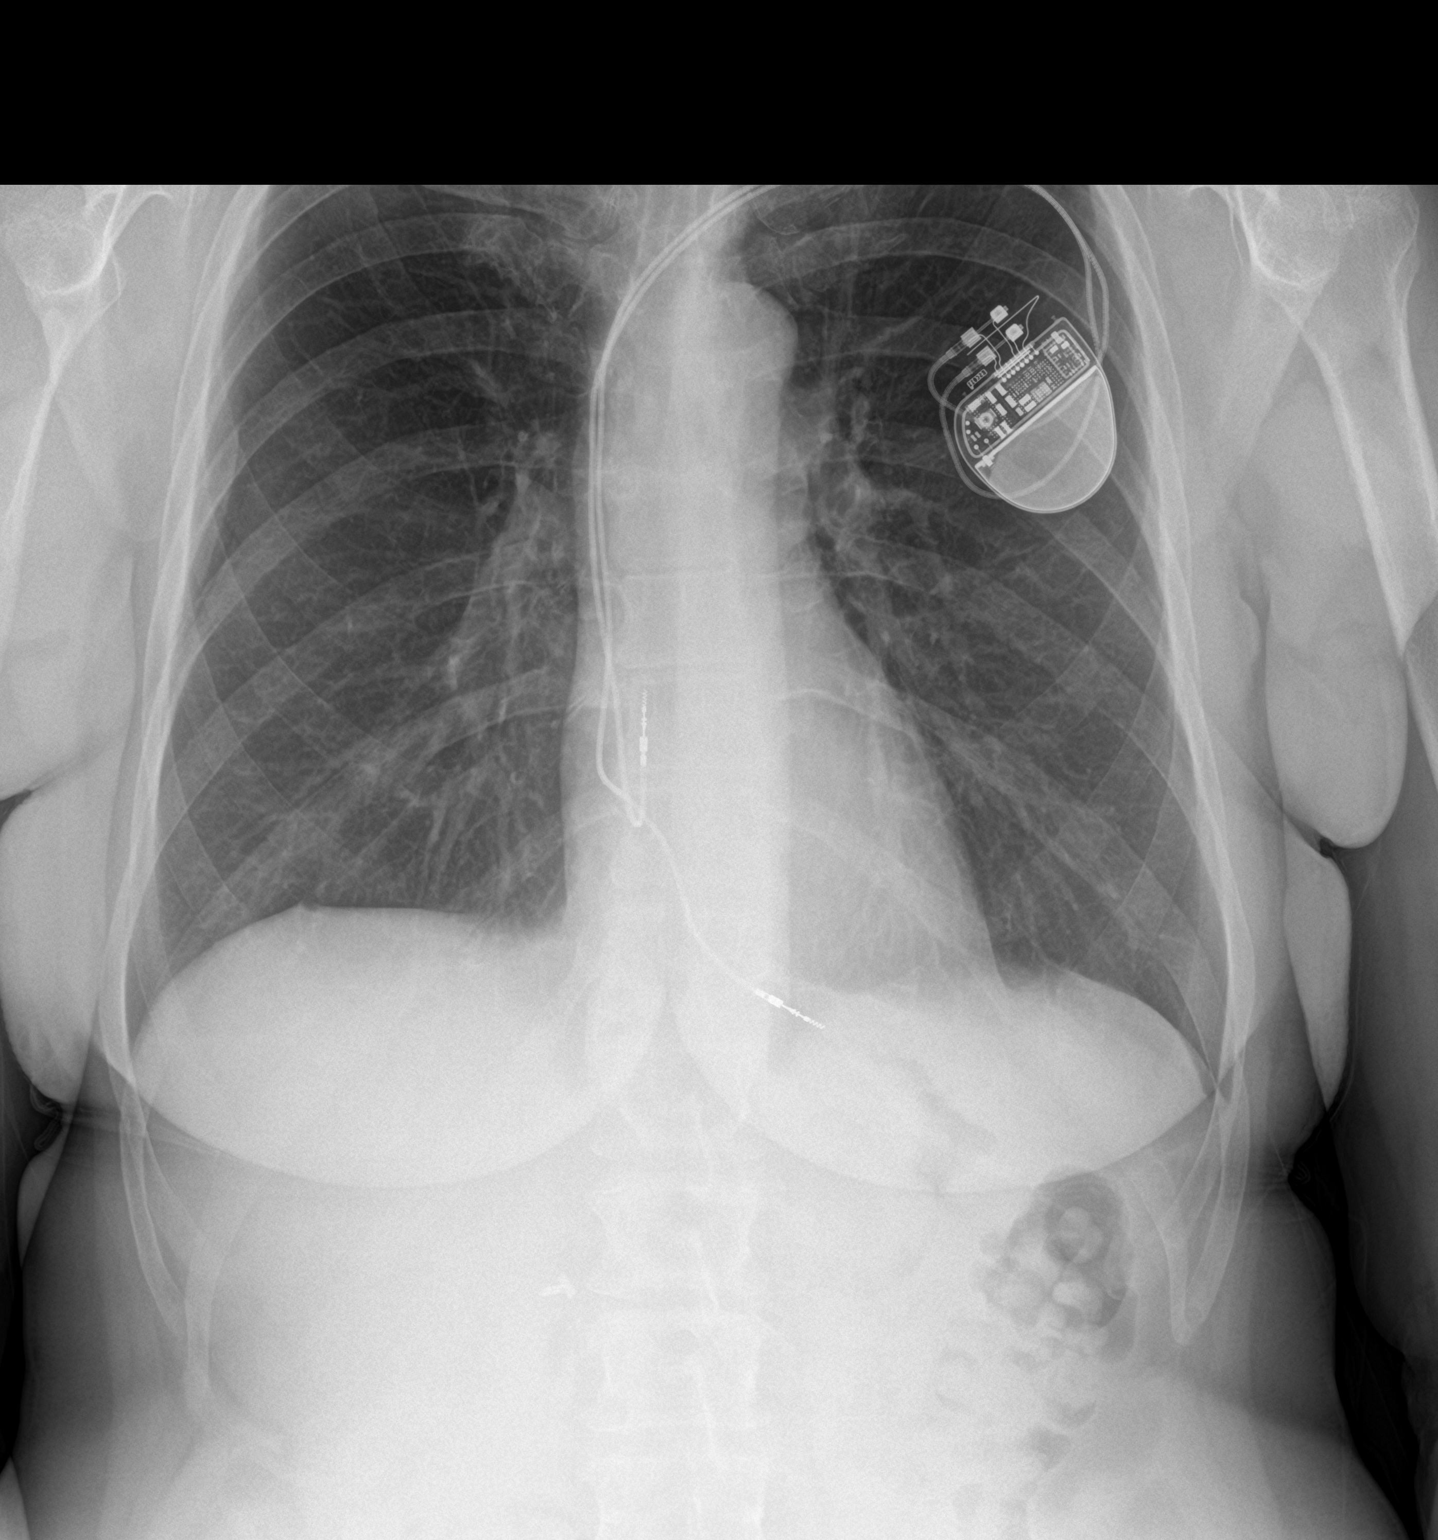

[chest lat]
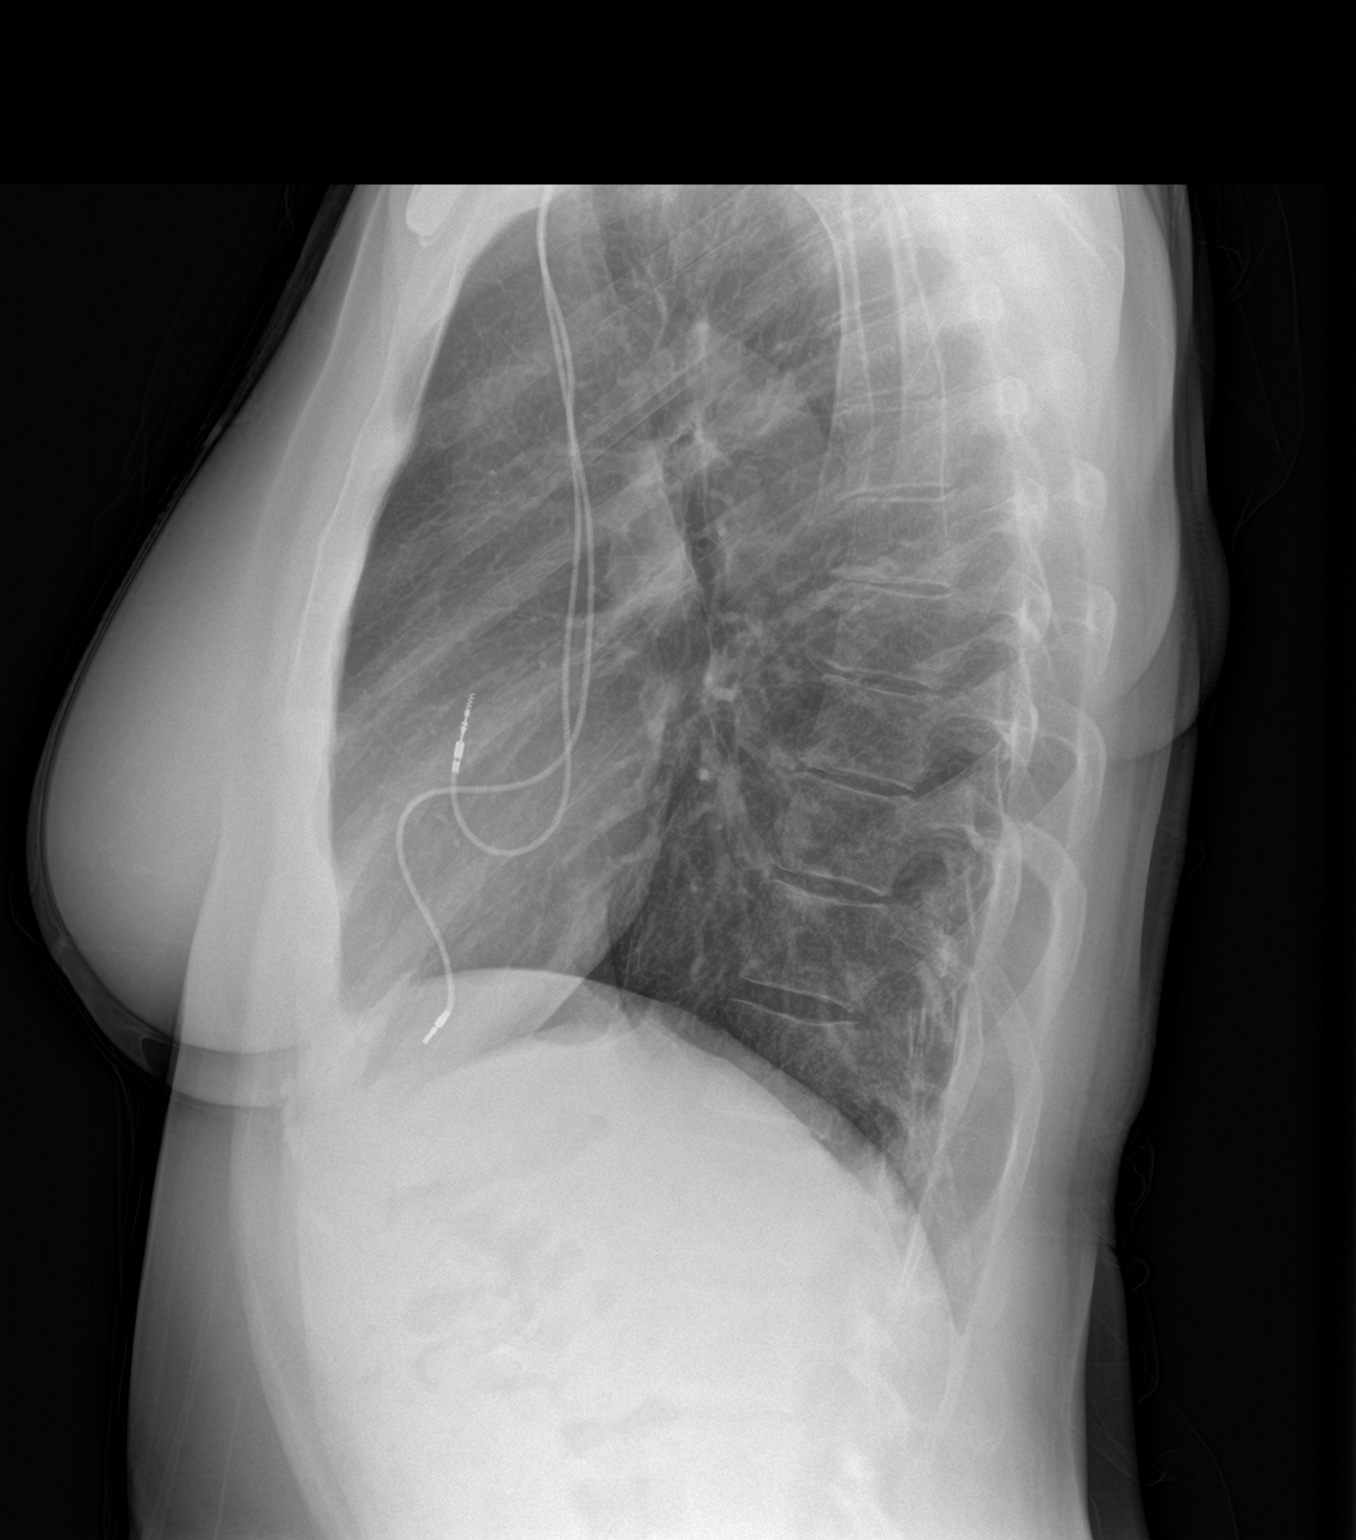

[2 of 2 positions shown; findings below may reference images not displayed]

FINDINGS: Cardiac shadow is within normal limits. Pacing device is again seen
and stable. The lungs are well aerated without focal infiltrate or
sizable effusion. No bony abnormality is noted.
IMPRESSION: No active cardiopulmonary disease.

## 2020-08-18 IMAGING — DX PORTABLE RIGHT KNEE - 1-2 VIEW
4 series · 4 of 4 positions shown · non-contrast
Comparison: None.

CLINICAL DATA: Right knee replacement

EXAM:
PORTABLE RIGHT KNEE - 1-2 VIEW

[knee ap (1 of 2)]
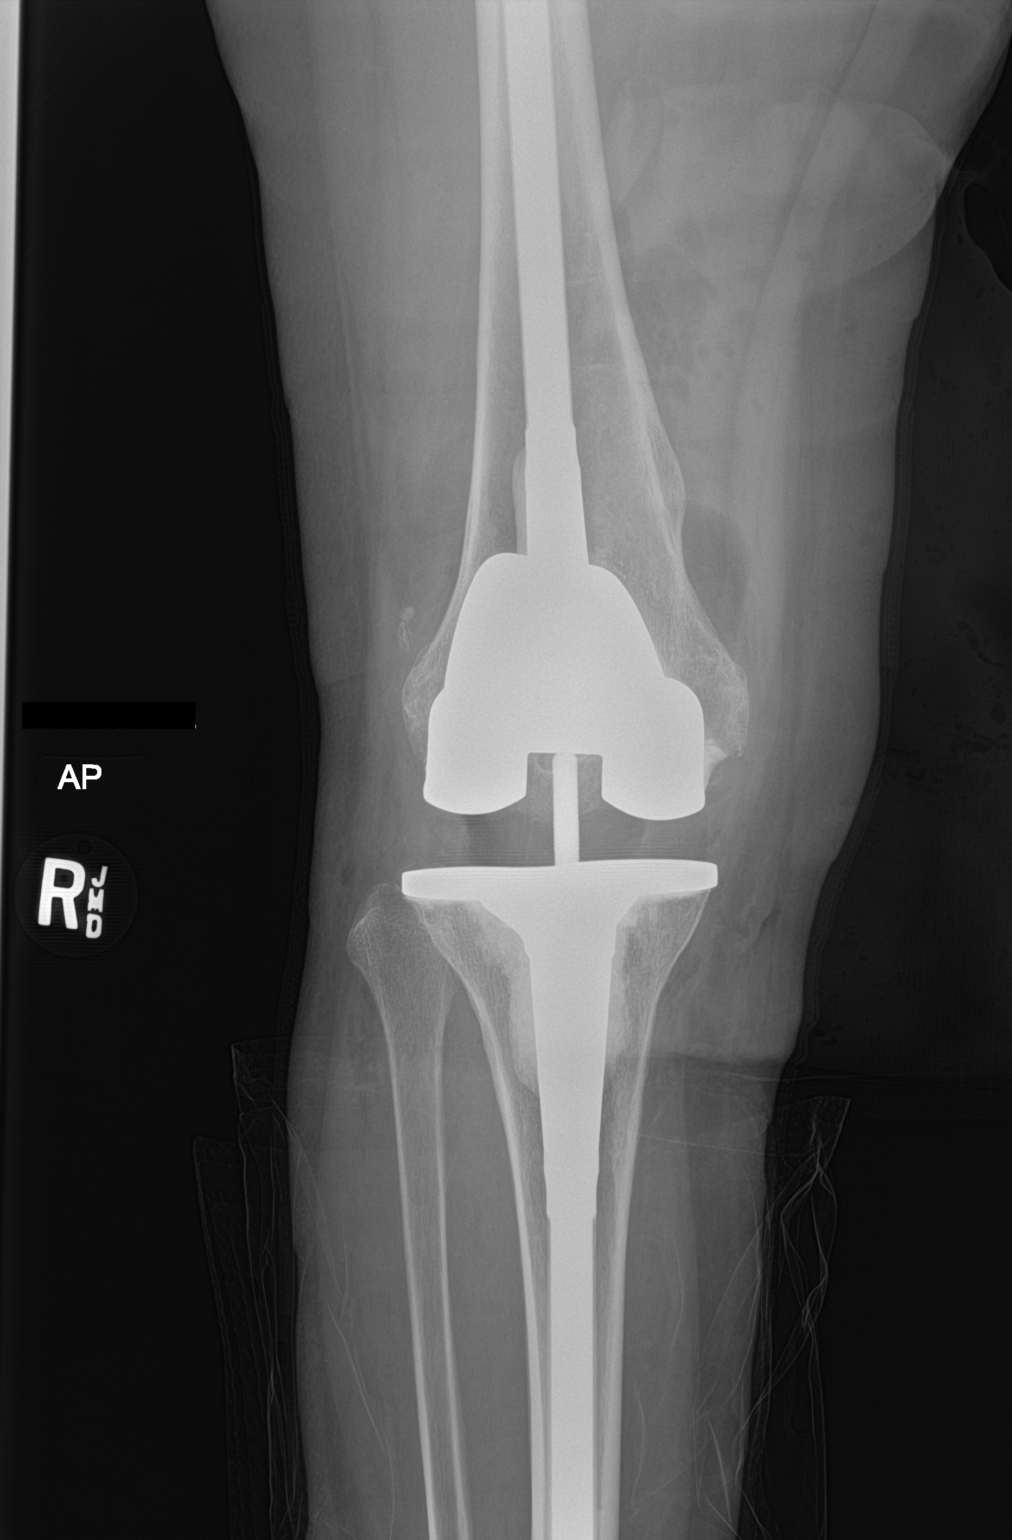

[knee lat (1 of 2)]
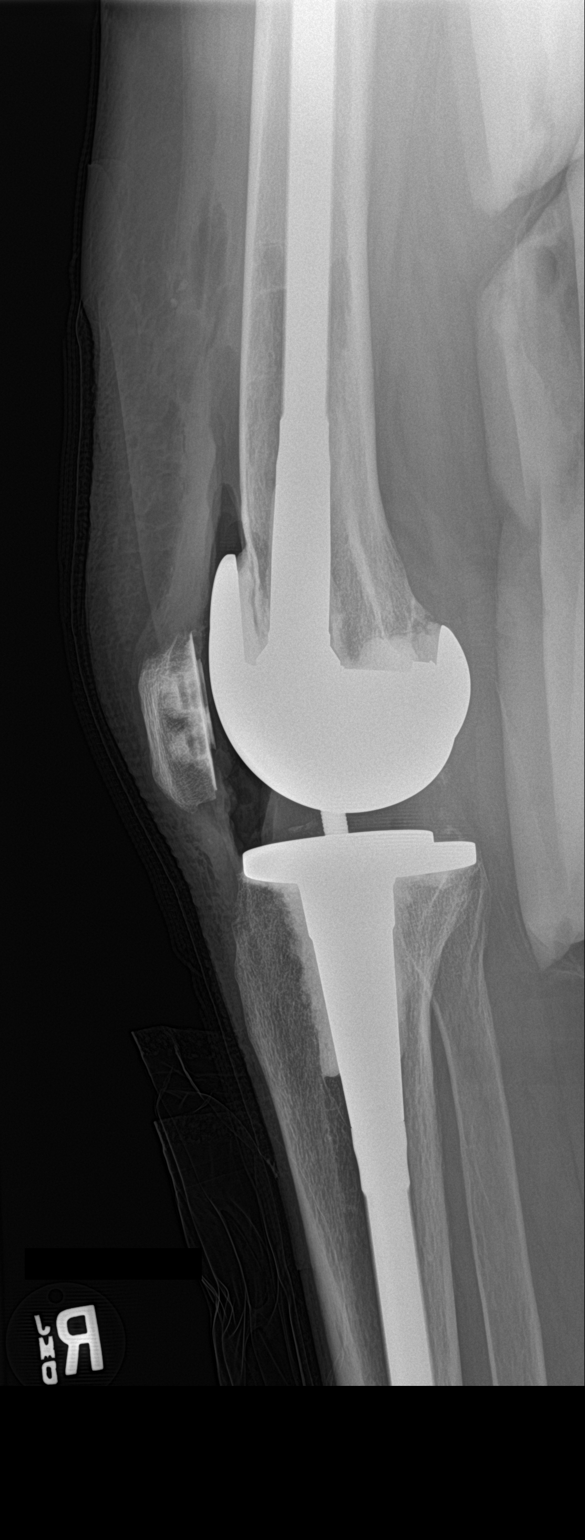

[knee ap (2 of 2)]
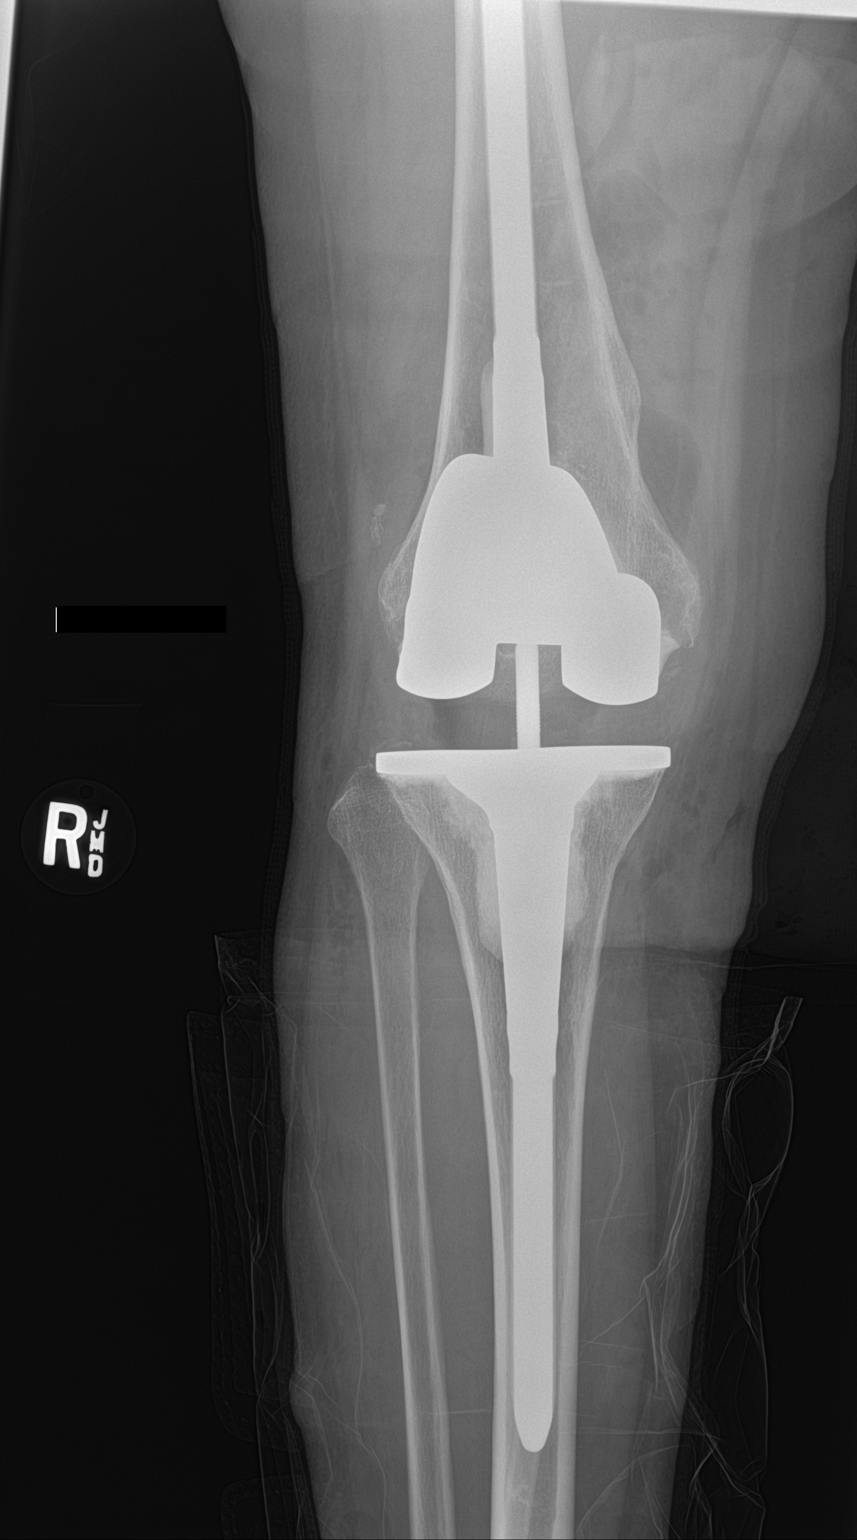

[knee lat (2 of 2)]
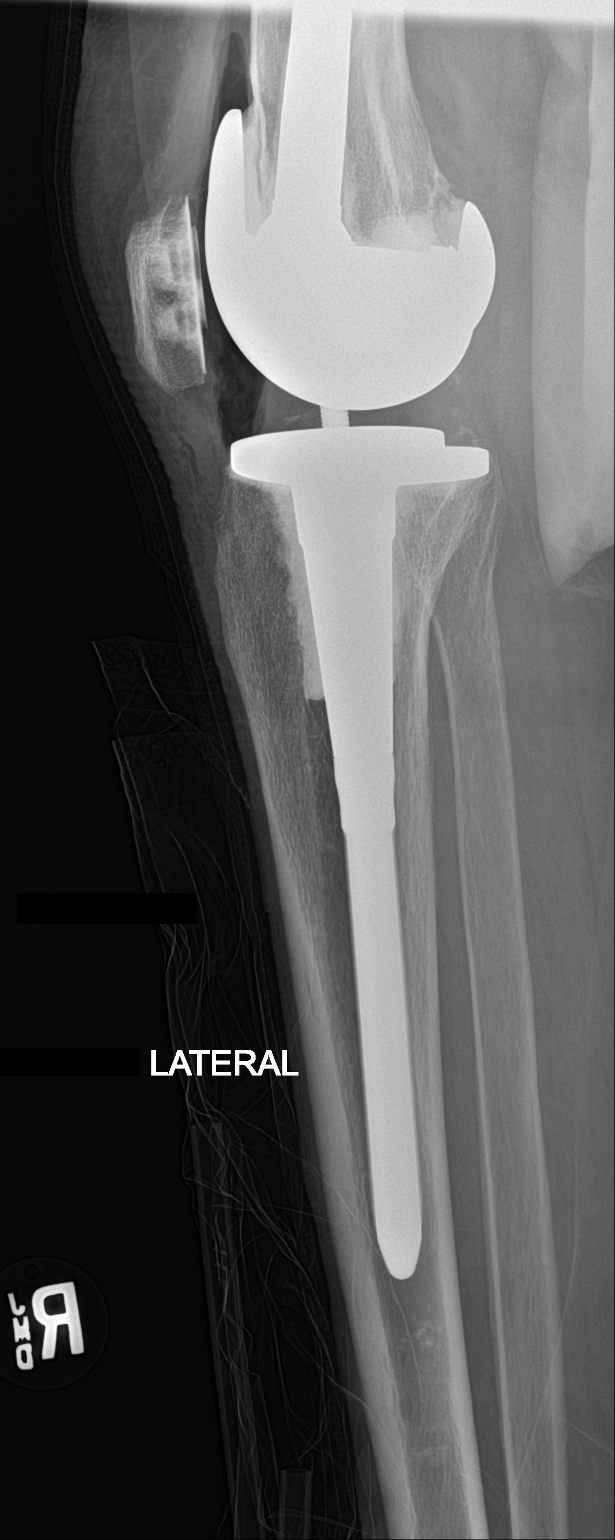

[4 of 4 positions shown; findings below may reference images not displayed]

FINDINGS: Changes of right knee replacement. No hardware or bony complicating
feature. Soft tissue and joint space gas noted.
IMPRESSION: Right knee replacement.  No visible complicating feature.

## 2020-10-03 ENCOUNTER — Telehealth: Payer: 59 | Admitting: Nurse Practitioner

## 2020-10-03 DIAGNOSIS — R399 Unspecified symptoms and signs involving the genitourinary system: Secondary | ICD-10-CM | POA: Diagnosis not present

## 2020-10-03 MED ORDER — FLUTICASONE PROPIONATE 50 MCG/ACT NA SUSP: 2.0000 | Freq: Every day | NASAL | 6 refills | Status: DC

## 2020-10-03 NOTE — Progress Notes (Signed)
E-Visit for Corona Virus Screening  Your current symptoms could be consistent with the coronavirus.  Many health care providers can now test patients at their office but not all are.  [facility] has multiple testing sites. For information on our COVID testing locations and hours go to [email or web address]  * Health at work is going to require a negative covid test before you can return to work. We need one before we can do any further treatment. Call health at work and arrange testing.  We are enrolling you in our [place] for [place] . Daily you will receive a questionnaire within the [place] website. Our COVID 19 response team will be monitoring your responses daily.  Testing Information: The COVID-19 Community Testing sites are testing BY APPOINTMENT ONLY.  You can schedule online at [email or web address]  If you do not have access to a smart phone or computer you may call [phone] for an appointment.   Additional testing sites in the Community:  . For [facility] Testing sites in [facility]  FaceUpdate.[email or web address]  . For Pop-up testing sites in [place]  [email or web address].co.uk  . For [facility] [email or web address]  . For [facility] testing in [place] and [employer] [email or web address]  . For [facility] testing in [facility]   [email or web address]  For  more information about community testing call [phone]   Please quarantine yourself while awaiting your test results. Please stay home for a minimum of 10 days from the first day of illness with improving symptoms and you have had 24 hours of no fever (without the use of Tylenol  (Acetaminophen) Motrin (Ibuprofen) or any fever reducing medication).  Also - Do not get tested prior to returning to work because once you have had a positive test the test can stay positive for more than a month in some cases.   You should wear a mask or cloth face covering over your nose and mouth if you must be around other people or animals, including pets (even at home). Try to stay at least 6 feet away from other people. This will protect the people around you.  Please continue good preventive care measures, including:  frequent hand-washing, avoid touching your face, cover coughs/sneezes, stay out of crowds and keep a 6 foot distance from others.  COVID-19 is a respiratory illness with symptoms that are similar to the flu. Symptoms are typically mild to moderate, but there have been cases of severe illness and death due to the virus.   The following symptoms may appear 2-14 days after exposure: . Fever . Cough . Shortness of breath or difficulty breathing . Chills . Repeated shaking with chills . Muscle pain . Headache . Sore throat . New loss of taste or smell . Fatigue . Congestion or runny nose . Nausea or vomiting . Diarrhea  Go to the nearest hospital ED for assessment if fever/cough/breathlessness are severe or illness seems like a threat to life.  It is vitally important that if you feel that you have an infection such as this virus or any other virus that you stay home and away from places where you may spread it to others.  You should avoid contact with people age 15 and older.   You can use medication such as A prescription for Fluticasone nasal spray 2 sprays in each nostril one time per day  You may also take acetaminophen (Tylenol) as needed for fever.  Reduce your risk of any infection by using the same precautions used  for avoiding the common cold or flu:  Marland Kitchen Wash your hands often with soap and warm water for at least 20 seconds.  If soap and water are not readily  available, use an alcohol-based hand sanitizer with at least 60% alcohol.  . If coughing or sneezing, cover your mouth and nose by coughing or sneezing into the elbow areas of your shirt or coat, into a tissue or into your sleeve (not your hands). . Avoid shaking hands with others and consider head nods or verbal greetings only. . Avoid touching your eyes, nose, or mouth with unwashed hands.  . Avoid close contact with people who are sick. . Avoid places or events with large numbers of people in one location, like concerts or sporting events. . Carefully consider travel plans you have or are making. . If you are planning any travel outside or inside the [place], visit the CDC's Travelers' Health webpage for the latest health notices. . If you have some symptoms but not all symptoms, continue to monitor at home and seek medical attention if your symptoms worsen. . If you are having a medical emergency, call 911.  HOME CARE . Only take medications as instructed by your medical team. . Drink plenty of fluids and get plenty of rest. . A steam or ultrasonic humidifier can help if you have congestion.   GET HELP RIGHT AWAY IF YOU HAVE EMERGENCY WARNING SIGNS** FOR COVID-19. If you or someone is showing any of these signs seek emergency medical care immediately. Call 911 or proceed to your closest emergency facility if: . You develop worsening high fever. . Trouble breathing . Bluish lips or face . Persistent pain or pressure in the chest . New confusion . Inability to wake or stay awake . You cough up blood. . Your symptoms become more severe  **This list is not all possible symptoms. Contact your medical provider for any symptoms that are sever or concerning to you.  MAKE SURE YOU   Understand these instructions.  Will watch your condition.  Will get help right away if you are not doing well or get worse.  Your e-visit answers were reviewed by a board certified advanced clinical  practitioner to complete your personal care plan.  Depending on the condition, your plan could have included both over the counter or prescription medications.  If there is a problem please reply once you have received a response from your provider.  Your safety is important to [place].  If you have drug allergies check your prescription carefully.    You can use MyChart to ask questions about today's visit, request a non-urgent call back, or ask for a work or school excuse for 24 hours related to this e-Visit. If it has been greater than 24 hours you will need to follow up with your provider, or enter a new e-Visit to address those concerns. You will get an e-mail in the next two days asking about your experience.  I hope that your e-visit has been valuable and will speed your recovery. Thank you for using e-visits.   5-10 minutes spent reviewing and documenting in chart.

## 2020-10-17 DIAGNOSIS — M25511 Pain in right shoulder: Secondary | ICD-10-CM | POA: Diagnosis not present

## 2020-11-01 ENCOUNTER — Ambulatory Visit (INDEPENDENT_AMBULATORY_CARE_PROVIDER_SITE_OTHER): Payer: 59

## 2020-11-01 DIAGNOSIS — I441 Atrioventricular block, second degree: Secondary | ICD-10-CM | POA: Diagnosis not present

## 2020-11-04 LAB — CUP PACEART REMOTE DEVICE CHECK
Battery Remaining Longevity: 54 mo
Battery Remaining Percentage: 55 %
Brady Statistic RA Percent Paced: 26 %
Brady Statistic RV Percent Paced: 100 %
Date Time Interrogation Session: 20220311163600
Implantable Lead Implant Date: 20140626
Implantable Lead Implant Date: 20140626
Implantable Lead Location: 753859
Implantable Lead Location: 753860
Implantable Lead Model: 5076
Implantable Lead Model: 5076
Implantable Pulse Generator Implant Date: 20140626
Lead Channel Impedance Value: 394 Ohm
Lead Channel Impedance Value: 463 Ohm
Lead Channel Pacing Threshold Amplitude: 0.7 V
Lead Channel Pacing Threshold Pulse Width: 0.5 ms
Lead Channel Setting Pacing Amplitude: 2 V
Lead Channel Setting Pacing Amplitude: 2.4 V
Lead Channel Setting Pacing Pulse Width: 0.4 ms
Lead Channel Setting Sensing Sensitivity: 8 mV
Pulse Gen Serial Number: 112476

## 2020-11-09 NOTE — Progress Notes (Signed)
Remote pacemaker transmission.   

## 2020-11-26 ENCOUNTER — Other Ambulatory Visit (HOSPITAL_COMMUNITY): Payer: Self-pay

## 2020-11-26 MED ORDER — VALACYCLOVIR HCL 1 G PO TABS
1.0000 g | ORAL_TABLET | Freq: Every day | ORAL | 2 refills | Status: DC | PRN
Start: 2020-02-21 — End: 2021-08-02
  Filled 2020-11-26: qty 90, 90d supply, fill #0

## 2020-11-27 ENCOUNTER — Other Ambulatory Visit (HOSPITAL_COMMUNITY): Payer: Self-pay

## 2020-12-25 ENCOUNTER — Telehealth: Payer: 59 | Admitting: Physician Assistant

## 2020-12-25 DIAGNOSIS — N390 Urinary tract infection, site not specified: Secondary | ICD-10-CM | POA: Diagnosis not present

## 2020-12-25 MED ORDER — FLUCONAZOLE 150 MG PO TABS: 150.0000 mg | ORAL_TABLET | Freq: Once | ORAL | 0 refills | Status: AC

## 2020-12-25 MED ORDER — NITROFURANTOIN MONOHYD MACRO 100 MG PO CAPS: 100.0000 mg | ORAL_CAPSULE | Freq: Two times a day (BID) | ORAL | 0 refills | Status: DC

## 2020-12-25 NOTE — Progress Notes (Signed)
We are sorry that you are not feeling well.  Here is how we plan to help!  Based on what you shared with me it looks like you most likely have a simple urinary tract infection.  A UTI (Urinary Tract Infection) is a bacterial infection of the bladder.  Most cases of urinary tract infections are simple to treat but a key part of your care is to encourage you to drink plenty of fluids and watch your symptoms carefully.  I have prescribed MacroBid 100 mg twice a day for 5 days.  Your symptoms should gradually improve. Call us if the burning in your urine worsens, you develop worsening fever, back pain or pelvic pain or if your symptoms do not resolve after completing the antibiotic.  Urinary tract infections can be prevented by drinking plenty of water to keep your body hydrated.  Also be sure when you wipe, wipe from front to back and don't hold it in!  If possible, empty your bladder every 4 hours.  Your e-visit answers were reviewed by a board certified advanced clinical practitioner to complete your personal care plan.  Depending on the condition, your plan could have included both over the counter or prescription medications.  If there is a problem please reply  once you have received a response from your provider.  Your safety is important to us.  If you have drug allergies check your prescription carefully.    You can use MyChart to ask questions about today's visit, request a non-urgent call back, or ask for a work or school excuse for 24 hours related to this e-Visit. If it has been greater than 24 hours you will need to follow up with your provider, or enter a new e-Visit to address those concerns.   You will get an e-mail in the next two days asking about your experience.  I hope that your e-visit has been valuable and will speed your recovery. Thank you for using e-visits.   Greater than 5 minutes, yet less than 10 minutes of time have been spent researching, coordinating, and  implementing care for this patient today  

## 2020-12-25 NOTE — Addendum Note (Signed)
Addended by: Abigail Butts on: 12/25/2020 08:17 AM   Modules accepted: Orders

## 2021-01-05 ENCOUNTER — Telehealth: Payer: 59 | Admitting: Orthopedic Surgery

## 2021-01-05 DIAGNOSIS — U071 COVID-19: Secondary | ICD-10-CM

## 2021-01-05 MED ORDER — BENZONATATE 100 MG PO CAPS: 100.0000 mg | ORAL_CAPSULE | Freq: Three times a day (TID) | ORAL | 0 refills | Status: DC | PRN

## 2021-01-05 MED ORDER — NAPROXEN 500 MG PO TABS: 500.0000 mg | ORAL_TABLET | Freq: Two times a day (BID) | ORAL | 0 refills | Status: DC

## 2021-01-05 NOTE — Progress Notes (Signed)
E-Visit for Positive Covid Test Result We are sorry you are not feeling well. We are here to help!  You have tested positive for COVID-19, meaning that you were infected with the novel coronavirus and could give the virus to others.  It is vitally important that you stay home so you do not spread it to others.      Please continue isolation at home, for at least 10 days since the start of your symptoms and until you have had 24 hours with no fever (without taking a fever reducer) and with improving of symptoms.  If you have no symptoms but tested positive (or all symptoms resolve after 5 days and you have no fever) you can leave your house but continue to wear a mask around others for an additional 5 days. If you have a fever,continue to stay home until you have had 24 hours of no fever. Most cases improve 5-10 days from onset but we have seen a small number of patients who have gotten worse after the 10 days.  Please be sure to watch for worsening symptoms and remain taking the proper precautions.   Go to the nearest hospital ED for assessment if fever/cough/breathlessness are severe or illness seems like a threat to life.    The following symptoms may appear 2-14 days after exposure: . Fever . Cough . Shortness of breath or difficulty breathing . Chills . Repeated shaking with chills . Muscle pain . Headache . Sore throat . New loss of taste or smell . Fatigue . Congestion or runny nose . Nausea or vomiting . Diarrhea  You have been enrolled in [place] for COVID-19. Daily you will receive a questionnaire within the [place] website. Our COVID-19 response team will be monitoring your responses daily.  You can use medication such as prescription cough medication called Tessalon Perles 100 mg. You may take 1-2 capsules every 8 hours as needed for cough and prescription anti-inflammatory called Naprosyn 500 mg. Take twice daily as needed for fever or body aches for 2  weeks  You may also take acetaminophen (Tylenol) as needed for fever.  HOME CARE: . Only take medications as instructed by your medical team. . Drink plenty of fluids and get plenty of rest. . A steam or ultrasonic humidifier can help if you have congestion.   GET HELP RIGHT AWAY IF YOU HAVE EMERGENCY WARNING SIGNS.  Call 911 or proceed to your closest emergency facility if: . You develop worsening high fever. . Trouble breathing . Bluish lips or face . Persistent pain or pressure in the chest . New confusion . Inability to wake or stay awake . You cough up blood. . Your symptoms become more severe . Inability to hold down food or fluids  This list is not all possible symptoms. Contact your medical provider for any symptoms that are severe or concerning to you.    Your e-visit answers were reviewed by a board certified advanced clinical practitioner to complete your personal care plan.  Depending on the condition, your plan could have included both over the counter or prescription medications.  If there is a problem please reply once you have received a response from your provider.  Your safety is important to [place].  If you have drug allergies check your prescription carefully.    You can use MyChart to ask questions about today's visit, request a non-urgent call back, or ask for a work or school excuse for 24 hours related to this  e-Visit. If it has been greater than 24 hours you will need to follow up with your provider, or enter a new e-Visit to address those concerns. You will get an e-mail in the next two days asking about your experience.  I hope that your e-visit has been valuable and will speed your recovery. Thank you for using e-visits.  Greater than 5 minutes, yet less than 10 minutes of time have been spent researching, coordinating and implementing care for this patient today.

## 2021-01-06 ENCOUNTER — Other Ambulatory Visit: Payer: Self-pay

## 2021-01-06 ENCOUNTER — Ambulatory Visit (HOSPITAL_COMMUNITY)
Admission: EM | Admit: 2021-01-06 | Discharge: 2021-01-06 | Disposition: A | Discharge status: Home / Self Care | Payer: 59 | Attending: Medical Oncology | Admitting: Medical Oncology

## 2021-01-06 ENCOUNTER — Encounter (HOSPITAL_COMMUNITY): Payer: Self-pay | Admitting: Emergency Medicine

## 2021-01-06 ENCOUNTER — Telehealth: Payer: 59 | Admitting: Physician Assistant

## 2021-01-06 ENCOUNTER — Telehealth: Payer: Self-pay | Admitting: Nurse Practitioner

## 2021-01-06 DIAGNOSIS — Z789 Other specified health status: Secondary | ICD-10-CM | POA: Insufficient documentation

## 2021-01-06 DIAGNOSIS — Z889 Allergy status to unspecified drugs, medicaments and biological substances status: Secondary | ICD-10-CM | POA: Diagnosis not present

## 2021-01-06 DIAGNOSIS — N390 Urinary tract infection, site not specified: Secondary | ICD-10-CM

## 2021-01-06 DIAGNOSIS — U071 COVID-19: Secondary | ICD-10-CM | POA: Insufficient documentation

## 2021-01-06 LAB — BASIC METABOLIC PANEL
Anion gap: 7 (ref 5–15)
BUN: 12 mg/dL (ref 8–23)
CO2: 28 mmol/L (ref 22–32)
Calcium: 9.2 mg/dL (ref 8.9–10.3)
Chloride: 103 mmol/L (ref 98–111)
Creatinine, Ser: 0.75 mg/dL (ref 0.44–1.00)
GFR, Estimated: >60 mL/min (ref >60)
Glucose, Bld: 99 mg/dL (ref 70–99)
Potassium: 4.2 mmol/L (ref 3.5–5.1)
Sodium: 138 mmol/L (ref 135–145)

## 2021-01-06 LAB — POCT URINALYSIS DIPSTICK, ED / UC
Glucose, UA: 250 mg/dL — AB
Nitrite: POSITIVE — AB
Protein, ur: 30 mg/dL — AB
Specific Gravity, Urine: <=1.005 (ref 1.005–1.030)
Urobilinogen, UA: 2 mg/dL — ABNORMAL HIGH (ref 0.0–1.0)
pH: 5 (ref 5.0–8.0)

## 2021-01-06 LAB — CBG MONITORING, ED: Glucose-Capillary: 98 mg/dL (ref 70–99)

## 2021-01-06 MED ORDER — CIPROFLOXACIN HCL 500 MG PO TABS: 500.0000 mg | ORAL_TABLET | Freq: Two times a day (BID) | ORAL | 0 refills | Status: AC

## 2021-01-06 MED ORDER — PHENAZOPYRIDINE HCL 200 MG PO TABS: 200.0000 mg | ORAL_TABLET | Freq: Three times a day (TID) | ORAL | 0 refills | Status: DC

## 2021-01-06 MED ORDER — FLUCONAZOLE 150 MG PO TABS: 150.0000 mg | ORAL_TABLET | Freq: Every day | ORAL | 0 refills | Status: DC

## 2021-01-06 NOTE — ED Provider Notes (Signed)
[facility]    CSN: [id number] Arrival date & time: 01/06/21  1251      History   Chief Complaint Chief Complaint  Patient presents with  . Dysuria  . Abdominal Pain  . Covid Positive    HPI Kari [name] is a 70 y.o. female.   HPI   Dysuria: Patient was seen virtually on 12/25/2020 for dysuria and abdominal pain.  She was treated with Macrobid for suspected urinary tract infection. She states that despite taking this antibiotic her symptoms have continued and have worsened slightly.  She had a low grade fever yesterday and has had some abdominal pain.  No vomiting but has had mild diarrhea. No chest pains or SOB. Of note patient was found to be COVID-positive yesterday via home testing. In addition she has multiple antibiotic allergies and intolerances.    Past Medical History:  Diagnosis Date  . Arthritis   . Chronic back pain   . Diverticulitis   . DVT (deep venous thrombosis) [facility]) age 33   s/p  knee arthroscopy   . Herpes zoster conjunctivitis   . Menopause   . Mobitz type 2 second degree AV block    2:1/notes 02/16/2013  . PONV (postoperative nausea and vomiting)   . Presence of permanent cardiac pacemaker   . Seasonal allergies    "spring & fall; not q year" (02/17/2013)  . Shoulder impingement    and left rotator cuff tear    Patient Active Problem List   Diagnosis Date Noted  . 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 Painful total knee replacement, right ([place]) 04/12/2019  . Near syncope 03/11/2013  . Bradycardia 02/21/2013  . S/P cardiac pacemaker procedure 02/20/2013  . Chest tightness 02/19/2013  . Palpitations 02/19/2013  . 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  . Diverticulitis 03/03/2012  . Chronic low back pain 08/28/2011  . GERD (gastroesophageal reflux disease)   . Herpes zoster conjunctivitis   . Menopause   . Arthritis   . Diverticulosis   . Allergy     Past Surgical  History:  Procedure Laterality Date  . ACHILLES TENDON SURGERY    . ARTHROGRAM KNEE Right 1975  . CHOLECYSTECTOMY  1990's  . colonscopy    . ESOPHAGOGASTRODUODENOSCOPY    . EYE SURGERY     muscle release right eye  . INSERT / REPLACE / REMOVE PACEMAKER  02/17/2013   [employer] Advantio dual-chamber pacemaker, model [id number],  . KNEE ARTHROSCOPY Right 1982, 1983, 1995, 1996  . KNEE ARTHROSCOPY W/ ACL RECONSTRUCTION Left 1984  . LAPAROSCOPIC SIGMOID COLECTOMY N/A 01/01/2015   Procedure: LAPAROSCOPIC SIGMOID COLECTOMY;  Surgeon: Stark Klein, MD;  Location: [place] [facility];  Service: General;  Laterality: N/A;  . PERMANENT PACEMAKER INSERTION N/A 02/17/2013   Procedure: PERMANENT PACEMAKER INSERTION;  Surgeon: Evans Lance, MD;  Location: [facility] C[facility];  Service: Cardiovascular;  Laterality: N/A;  . SHOULDER ARTHROSCOPY Left 09/05/2016   Procedure: LEFT SHOULDER ARTHROSCOPY , ACROMIOPLASTY AND ROTATOR CUFF REPAIR;  Surgeon: Melrose Nakayama, MD;  Location: [facility];  Service: Orthopedics;  Laterality: Left;  . TONSILLECTOMY  1961  . TOTAL KNEE ARTHROPLASTY Right 2000; 06/2000   "replaced w/appropriate hardware" (02/17/2013)  . TOTAL KNEE REVISION Right 04/13/2019   Procedure: Revision Right Knee Arthroplasty;  Surgeon: Frederik Pear, MD;  Location: [facility];  Service: Orthopedics;  Laterality: Right;  . TRIGGER FINGER RELEASE Right ~ 2012   "thumb" (02/17/2013)  . VAGINAL HYSTERECTOMY  ~  1994   partial     OB History    Gravida  1   Para  1   Term      Preterm      AB      Living        SAB      IAB      Ectopic      Multiple      Live Births               Home Medications    Prior to Admission medications   Medication Sig Start Date End Date Taking? Authorizing Provider  ciprofloxacin (CIPRO) 500 MG tablet Take 1 tablet (500 mg total) by mouth 2 (two) times daily for 7 days. 01/06/21 01/13/21 Yes Burnadette Baskett M, PA-C  fluconazole (DIFLUCAN) 150 MG tablet Take 1 tablet  (150 mg total) by mouth daily. 01/06/21  Yes Amayia Ciano M, PA-C  phenazopyridine (PYRIDIUM) 200 MG tablet Take 1 tablet (200 mg total) by mouth 3 (three) times daily. 01/06/21  Yes Genny Caulder M, PA-C  Ascorbic Acid (VITAMIN C) 100 MG tablet Take 200 mg by mouth daily.    [provider]  aspirin EC 81 MG tablet Take 81 mg by mouth daily.    [provider]  benzonatate (TESSALON) 100 MG capsule Take 1 capsule (100 mg total) by mouth 3 (three) times daily as needed for cough. 01/05/21   Lisette Abu, PA-C  Calcium Carb-Cholecalciferol (CALCIUM 600+D3 PO) Take 1 tablet by mouth daily.    [provider]  Cyanocobalamin (VITAMIN B-12) 2500 MCG SUBL Place 2,500 mcg under the tongue daily.     [provider]  diclofenac sodium (VOLTAREN) 1 % GEL Apply 1-2 g topically 4 (four) times daily as needed (knee pain.).    [provider]  esomeprazole (NEXIUM) 40 MG capsule Take 20 mg by mouth daily.  01/28/18   [provider]  fluticasone (FLONASE) 50 MCG/ACT nasal spray Place 2 sprays into both nostrils daily. 10/03/20   Chevis Pretty, FNP  influenza vaccine adjuvanted (FLUAD) 0.5 ML injection TAKE AS DIRECTED 05/28/20 05/28/21  Carlyle Basques, MD  Melatonin 3 MG TABS Take 6 mg by mouth at bedtime as needed (sleep.).    [provider]  Multiple Vitamin (MULTIVITAMIN WITH MINERALS) TABS Take 1 tablet by mouth daily.    [provider]  naproxen (NAPROSYN) 500 MG tablet Take 1 tablet (500 mg total) by mouth 2 (two) times daily with a meal. 01/05/21   Lisette Abu, PA-C  nitrofurantoin, macrocrystal-monohydrate, (MACROBID) 100 MG capsule Take 1 capsule (100 mg total) by mouth 2 (two) times daily. X 7 days 12/25/20   Muthersbaugh, Jarrett Soho, PA-C  Omega-3 Fatty Acids (FISH OIL ULTRA) 1400 MG CAPS Take 1,400 mg by mouth daily.    [provider]  Probiotic Product (PROBIOTIC FORMULA PO) Take 1 capsule by mouth  daily.     [provider]  traZODone (DESYREL) 50 MG tablet Take 0.5-1 tablets (25-50 mg total) by mouth at bedtime as needed for sleep. 03/23/20   Shelda Pal, DO  valACYclovir (VALTREX) 1000 MG tablet Take 1 tablet (1,000 mg total) by mouth daily as needed (herpes zoster). 02/21/20   Shelda Pal, DO  valACYclovir (VALTREX) 1000 MG tablet Take 1 tablet (1,000 mg total) by mouth daily as needed. 02/21/20   Shelda Pal, DO    Family History Family History  Problem Relation Age of Onset  .  Hypertension Mother   . Hyperlipidemia Mother   . Macular degeneration Mother   . COPD Mother   . Lung cancer Father   . Cancer Father        Lung  . COPD Father   . Diabetes Brother   . Breast cancer Paternal Aunt     Social History Social History   Tobacco Use  . Smoking status: Never Smoker  . Smokeless tobacco: Never Used  Substance Use Topics  . Alcohol use: Yes    Comment: occasional wine   . Drug use: No     Allergies   Ceftin, Vancomycin, Bactrim [sulfamethoxazole-trimethoprim], and Levofloxacin   Review of Systems Review of Systems  As stated above in HPI Physical Exam Triage Vital Signs ED Triage Vitals  Enc Vitals Group     BP 01/06/21 1313 (!) 141/63     Pulse Rate 01/06/21 1313 80     Resp 01/06/21 1313 18     Temp 01/06/21 1313 98.5 F (36.9 C)     Temp Source 01/06/21 1313 Oral     SpO2 01/06/21 1313 97 %     Weight --      Height --      Head Circumference --      Peak Flow --      Pain Score 01/06/21 1309 0     Pain Loc --      Pain Edu? --      Excl. in [place]? --    No data found.  Updated Vital Signs BP (!) 141/63 (BP Location: Right Arm)   Pulse 80   Temp 98.5 F (36.9 C) (Oral)   Resp 18   SpO2 97%    Physical Exam Vitals and nursing note reviewed.  Constitutional:      General: She is not in acute distress.    Appearance: She is well-developed. She is not ill-appearing, toxic-appearing or  diaphoretic.  HENT:     Head: Normocephalic and atraumatic.  Cardiovascular:     Rate and Rhythm: Normal rate and regular rhythm.     Heart sounds: Normal heart sounds.  Pulmonary:     Effort: Pulmonary effort is normal. No respiratory distress.     Breath sounds: Normal breath sounds. No stridor. No wheezing, rhonchi or rales.  Chest:     Chest wall: No tenderness.  Abdominal:     General: Bowel sounds are normal.     Palpations: Abdomen is soft. There is no hepatomegaly, splenomegaly, mass or pulsatile mass.     Tenderness: There is abdominal tenderness. There is no right CVA tenderness, left CVA tenderness, guarding or rebound. Negative signs include Murphy's sign and McBurney's sign.     Hernia: No hernia is present.  Skin:    General: Skin is warm.  Neurological:     Mental Status: She is alert and oriented to person, place, and time.     Motor: No weakness.      UC Treatments / Results  Labs (all labs ordered are listed, but only abnormal results are displayed) Labs Reviewed  POCT URINALYSIS DIPSTICK, ED / UC - Abnormal; Notable for the following components:      Result Value   Glucose, UA 250 (*)    Bilirubin Urine SMALL (*)    Ketones, ur TRACE (*)    Hgb urine dipstick TRACE (*)    Protein, ur 30 (*)    Urobilinogen, UA 2.0 (*)    Nitrite POSITIVE (*)  Leukocytes,Ua LARGE (*)    All other components within normal limits  SARS CORONAVIRUS 2 (TAT 6-24 HRS)  BASIC METABOLIC PANEL  CBG MONITORING, ED    EKG   Radiology No results found.  Procedures Procedures (including critical care time)  Medications Ordered in UC Medications - No data to display  Initial Impression / Assessment and Plan / UC Course  I have reviewed the triage vital signs and the nursing notes.  Pertinent labs & imaging results that were available during my care of the patient were reviewed by me and considered in my medical decision making (see chart for details).     New.   She will COVID PCR testing today.  Treating for complicated UTI- first-line therapy would be ceftriaxone 1 g followed by outpatient Bactrim versus Levaquin however she has intolerances to all of these.  She has tolerated ciprofloxacin well in the past.  Given her past urinary culture results paired with this information I am left with ciprofloxacin is the best medication of choice for her symptoms.  We discussed risks and benefits. She asks for pyridium and diflucan as she typically needs these for UTIs.   Discussed COVID monitoring and will obtain a BMP for potential Paxlovid use.  Final Clinical Impressions(s) / UC Diagnoses   Final diagnoses:  Complicated UTI (urinary tract infection)  Medically complex patient  Multiple drug allergies  COVID-19   Discharge Instructions   None    ED Prescriptions    Medication Sig Dispense Auth. Provider   ciprofloxacin (CIPRO) 500 MG tablet Take 1 tablet (500 mg total) by mouth 2 (two) times daily for 7 days. 14 tablet Rickiya Picariello M, PA-C   fluconazole (DIFLUCAN) 150 MG tablet Take 1 tablet (150 mg total) by mouth daily. 1 tablet Alexandrya Chim M, PA-C   phenazopyridine (PYRIDIUM) 200 MG tablet Take 1 tablet (200 mg total) by mouth 3 (three) times daily. 6 tablet Hughie Closs, [place]     PDMP not reviewed this encounter.   Hughie Closs, [place] 01/06/21 1449

## 2021-01-06 NOTE — Telephone Encounter (Signed)
Called to discuss with patient about COVID-19 symptoms and the use of one of the available treatments for those with mild to moderate Covid symptoms and at a high risk of hospitalization.  Pt appears to qualify for outpatient treatment due to co-morbid conditions and/or a member of an at-risk group in accordance with the FDA Emergency Use Authorization.    Symptom onset: 01/04/21 Vaccinated: Yes Booster? Not on file Immunocompromised? No Qualifiers: CAD, pacemaker NIH Criteria:   Unable to reach pt - Voicemail and Mychart message sent. Patient's recent labs on 01/06/2021 in anticipation for Paxlovid. She would be an appropriate candidate if agreeable.   Alda Lea, NP [place] Treatment Team [phone]

## 2021-01-06 NOTE — ED Triage Notes (Signed)
Pt presents with dysuria and bladder pain. States was treated with macrobid on 5/3 and completed antibiotic but symptoms have not been resolved.

## 2021-01-06 NOTE — Progress Notes (Signed)
Based on what you shared with me, I feel your condition warrants further evaluation and I recommend that you be seen in a face to face office visit. Giving recent treatment for UTI and incomplete clearance of symptoms, you need to be seen in person to get your urine sent for a culture so we can make sure it is being treated appropriately. If not, this will continue to persist or recur, and can develop into a more severe kidney infection that can require hospitalization. Giving this you need to be seen at a local Urgent Care today if possible to get your urine tested and proper treatment course started.    NOTE: If you entered your credit card information for this eVisit, you will not be charged. You may see a "hold" on your card for the $35 but that hold will drop off and you will not have a charge processed.   If you are having a true medical emergency please call 911.      For an urgent face to face visit, [facility] has six urgent care centers for your convenience:     [facility] [place] Get Driving Directions [phone] [address] [place], [place] [address] . 8 am - 4 pm Monday - Friday    [facility]) Get Driving Directions [phone] [address] [place], [place] [address] . 8 am to 8 pm Monday-Friday . 10 am to 6 pm [facility]) Get Driving Directions [phone]  [address] [place],  [place]  [address] . 8 am to 8 pm Monday-Friday . 8 am to 4 pm [facility] at [facility] Get Driving Directions [phone] [place], [place], [place] [address] . 8 am to 8 pm Monday-Friday . 8 am to 4 pm [facility] Get Driving Directions  [phone] [address] [place], [place] [address] . 8 am to 8 pm  Monday-Friday . 8 am to 4 pm [facility] at [place] Get Driving Directions [phone] [address] [place], [place] [address] . 8 am to 8 pm Monday-Friday . 8 am to 4 pm Saturday-Sunday     Your MyChart E-visit questionnaire answers were reviewed by a board certified advanced clinical practitioner to complete your personal care plan based on your specific symptoms.  Thank you for using e-Visits.

## 2021-01-07 ENCOUNTER — Other Ambulatory Visit (HOSPITAL_BASED_OUTPATIENT_CLINIC_OR_DEPARTMENT_OTHER): Payer: Self-pay

## 2021-01-07 ENCOUNTER — Other Ambulatory Visit: Payer: Self-pay

## 2021-01-07 ENCOUNTER — Encounter: Payer: Self-pay | Admitting: Family Medicine

## 2021-01-07 ENCOUNTER — Other Ambulatory Visit (INDEPENDENT_AMBULATORY_CARE_PROVIDER_SITE_OTHER): Payer: 59 | Admitting: Family Medicine

## 2021-01-07 ENCOUNTER — Telehealth (INDEPENDENT_AMBULATORY_CARE_PROVIDER_SITE_OTHER): Payer: 59 | Admitting: Family Medicine

## 2021-01-07 VITALS — Temp 103.0°F | Ht 68.0 in | Wt 155.0 lb

## 2021-01-07 DIAGNOSIS — U071 COVID-19: Secondary | ICD-10-CM

## 2021-01-07 LAB — SARS CORONAVIRUS 2 (TAT 6-24 HRS): SARS Coronavirus 2: POSITIVE — AB

## 2021-01-07 MED ORDER — PAXLOVID 20 X 150 MG & 10 X 100MG PO TBPK: ORAL_TABLET | ORAL | 0 refills | Status: DC

## 2021-01-07 MED ORDER — NIRMATRELVIR/RITONAVIR (PAXLOVID)TABLET: ORAL_TABLET | ORAL | 0 refills | Status: DC

## 2021-01-07 MED ORDER — PAXLOVID 20 X 150 MG & 10 X 100MG PO TBPK
ORAL_TABLET | ORAL | 0 refills | Status: DC
  Filled 2021-01-07: qty 30, 5d supply, fill #0

## 2021-01-07 MED ORDER — PAXLOVID 20 X 150 MG & 10 X 100MG PO TBPK
ORAL_TABLET | ORAL | 0 refills | Status: DC
  Filled 2021-01-07: qty 10, 5d supply, fill #0

## 2021-01-07 NOTE — Progress Notes (Signed)
Chief Complaint  Patient presents with  . Covid Positive    Got the result sat.  Sx started- Tuesday but had a tickle in throat. Works in the [employer] here for URI complaints. Due to COVID-19 pandemic, we are interacting via web portal for an electronic face-to-face visit. I verified patient's ID using 2 identifiers. Patient agreed to proceed with visit via this method. Patient is at home, I am at office. Patient and I are present for visit.   Duration: 3 days  Associated symptoms: Fever (103), nasal congestion, sinus headache, fatigue, sore throat, myalgia and coughing Denies: sinus pain, rhinorrhea, itchy watery eyes, ear pain, ear drainage, wheezing, shortness of breath and N/V/D Treatment to date: Tylenol, Advil, phenylephrine, Mucinex Sick contacts: No  Triple vaccinated.   Past Medical History:  Diagnosis Date  . Arthritis   . Chronic back pain   . Diverticulitis   . DVT (deep venous thrombosis) [facility]) age 14   s/p  knee arthroscopy   . Herpes zoster conjunctivitis   . Menopause   . Mobitz type 2 second degree AV block    2:1/notes 02/16/2013  . PONV (postoperative nausea and vomiting)   . Presence of permanent cardiac pacemaker   . Seasonal allergies    "spring & fall; not q year" (02/17/2013)  . Shoulder impingement    and left rotator cuff tear    Temp (!) 103 F (39.4 C) (Tympanic)   Ht 5\' 8"  (1.727 m)   Wt 155 lb (70.3 kg)   BMI 23.57 kg/m  No conversational dyspnea Age appropriate judgment and insight Nml affect and mood  COVID-19  Paxlovid. GFR reviewed. No meds will interact.  Continue to push fluids, practice good hand hygiene, cover mouth when coughing. F/u prn. If starting to experience irreplaceable fluid loss, shaking, or shortness of breath, seek immediate care. Pt voiced understanding and agreement to the plan.  [place], DO 01/07/21 2:28 PM

## 2021-01-07 NOTE — Progress Notes (Signed)
No chief complaint on file.   [place] here for URI complaints.  Duration: 3 days  Associated symptoms: Fever (103 F), sinus headache, sinus congestion, sore throat, myalgia, diarrhea and coughing Denies: sinus pain, rhinorrhea, itchy watery eyes, ear pain, ear drainage, wheezing, shortness of breath and N/V Treatment to date: Mucinex, DM Sick contacts: No  Vaccinated and boosted.   Past Medical History:  Diagnosis Date  . Arthritis   . Chronic back pain   . Diverticulitis   . DVT (deep venous thrombosis) [facility]) age 70   s/p  knee arthroscopy   . Herpes zoster conjunctivitis   . Menopause   . Mobitz type 2 second degree AV block    2:1/notes 02/16/2013  . PONV (postoperative nausea and vomiting)   . Presence of permanent cardiac pacemaker   . Seasonal allergies    "spring & fall; not q year" (02/17/2013)  . Shoulder impingement    and left rotator cuff tear   Objective No conversational dyspnea Age appropriate judgment and insight Nml affect and mood  COVID - Plan: Nirmatrelvir & Ritonavir (PAXLOVID) 20 x 150 MG & 10 x 100MG  TBPK  Given age, will rx Paxlovid. She is not on any meds that will interact.  Continue to push fluids, practice good hand hygiene, cover mouth when coughing. F/u prn. If starting to experience irreplaceable fluid loss, shaking, or shortness of breath, seek immediate care. Pt voiced understanding and agreement to the plan.  [place], DO 01/07/21 3:04 PM

## 2021-01-08 ENCOUNTER — Other Ambulatory Visit (HOSPITAL_BASED_OUTPATIENT_CLINIC_OR_DEPARTMENT_OTHER): Payer: Self-pay

## 2021-01-16 ENCOUNTER — Telehealth: Payer: Self-pay | Admitting: Family Medicine

## 2021-01-16 MED ORDER — NITROFURANTOIN MONOHYD MACRO 100 MG PO CAPS: 100.0000 mg | ORAL_CAPSULE | Freq: Two times a day (BID) | ORAL | 0 refills | Status: AC

## 2021-01-16 NOTE — Telephone Encounter (Signed)
Will send in [place] since I just saw her. F/u in office if no better. Ty.

## 2021-01-16 NOTE — Telephone Encounter (Signed)
Last seen in ED on 01/06/21 for UTI PCP video visit for Covid on 01/07/21

## 2021-01-16 NOTE — Telephone Encounter (Signed)
Pt, called and stated she still have her uti  She would like a prescription sent in for her  Please advice

## 2021-01-16 NOTE — Telephone Encounter (Signed)
Patient informed of medication sent in.

## 2021-02-08 ENCOUNTER — Ambulatory Visit (INDEPENDENT_AMBULATORY_CARE_PROVIDER_SITE_OTHER): Payer: 59

## 2021-02-08 DIAGNOSIS — I441 Atrioventricular block, second degree: Secondary | ICD-10-CM | POA: Diagnosis not present

## 2021-02-08 LAB — CUP PACEART REMOTE DEVICE CHECK
Battery Remaining Longevity: 42 mo
Battery Remaining Percentage: 47 %
Brady Statistic RA Percent Paced: 26 %
Brady Statistic RV Percent Paced: 100 %
Date Time Interrogation Session: 20220617075200
Implantable Lead Implant Date: 20140626
Implantable Lead Implant Date: 20140626
Implantable Lead Location: 753859
Implantable Lead Location: 753860
Implantable Lead Model: 5076
Implantable Lead Model: 5076
Implantable Pulse Generator Implant Date: 20140626
Lead Channel Impedance Value: 407 Ohm
Lead Channel Impedance Value: 447 Ohm
Lead Channel Pacing Threshold Amplitude: 0.7 V
Lead Channel Pacing Threshold Pulse Width: 0.5 ms
Lead Channel Setting Pacing Amplitude: 2 V
Lead Channel Setting Pacing Amplitude: 2.4 V
Lead Channel Setting Pacing Pulse Width: 0.4 ms
Lead Channel Setting Sensing Sensitivity: 8 mV
Pulse Gen Serial Number: 112476

## 2021-02-27 NOTE — Progress Notes (Signed)
Remote pacemaker transmission.   

## 2021-03-04 ENCOUNTER — Ambulatory Visit: Payer: 59 | Attending: Internal Medicine

## 2021-03-04 DIAGNOSIS — Z23 Encounter for immunization: Secondary | ICD-10-CM

## 2021-03-08 ENCOUNTER — Other Ambulatory Visit (HOSPITAL_BASED_OUTPATIENT_CLINIC_OR_DEPARTMENT_OTHER): Payer: Self-pay

## 2021-03-08 MED ORDER — COVID-19 MRNA VAC-TRIS(PFIZER) 30 MCG/0.3ML IM SUSP
INTRAMUSCULAR | 0 refills | Status: DC
  Filled 2021-03-08: qty 0.3, 1d supply, fill #0

## 2021-03-14 DIAGNOSIS — Z96651 Presence of right artificial knee joint: Secondary | ICD-10-CM | POA: Diagnosis not present

## 2021-03-19 ENCOUNTER — Other Ambulatory Visit (HOSPITAL_COMMUNITY): Payer: Self-pay | Admitting: Orthopedic Surgery

## 2021-03-19 DIAGNOSIS — M25561 Pain in right knee: Secondary | ICD-10-CM

## 2021-03-25 ENCOUNTER — Telehealth: Payer: 59 | Admitting: Physician Assistant

## 2021-03-25 DIAGNOSIS — R3989 Other symptoms and signs involving the genitourinary system: Secondary | ICD-10-CM | POA: Diagnosis not present

## 2021-03-25 MED ORDER — NITROFURANTOIN MONOHYD MACRO 100 MG PO CAPS: 100.0000 mg | ORAL_CAPSULE | Freq: Two times a day (BID) | ORAL | 0 refills | Status: DC

## 2021-03-25 NOTE — Progress Notes (Signed)

## 2021-03-29 ENCOUNTER — Encounter (HOSPITAL_COMMUNITY)
Admission: RE | Admit: 2021-03-29 | Discharge: 2021-03-29 | Disposition: A | Discharge status: Home / Self Care | Payer: 59 | Source: Ambulatory Visit | Attending: Orthopedic Surgery | Admitting: Orthopedic Surgery

## 2021-03-29 ENCOUNTER — Other Ambulatory Visit: Payer: Self-pay

## 2021-03-29 DIAGNOSIS — M25561 Pain in right knee: Secondary | ICD-10-CM | POA: Diagnosis not present

## 2021-03-29 DIAGNOSIS — E119 Type 2 diabetes mellitus without complications: Secondary | ICD-10-CM | POA: Diagnosis not present

## 2021-03-29 MED ORDER — TECHNETIUM TC 99M MEDRONATE IV KIT
20.0000 | PACK | Freq: Once | INTRAVENOUS | Status: AC | PRN
  Administered 2021-03-29: 21 via INTRAVENOUS

## 2021-04-24 ENCOUNTER — Other Ambulatory Visit (HOSPITAL_BASED_OUTPATIENT_CLINIC_OR_DEPARTMENT_OTHER): Payer: Self-pay | Admitting: Family Medicine

## 2021-04-24 DIAGNOSIS — Z1231 Encounter for screening mammogram for malignant neoplasm of breast: Secondary | ICD-10-CM

## 2021-04-29 ENCOUNTER — Encounter (HOSPITAL_BASED_OUTPATIENT_CLINIC_OR_DEPARTMENT_OTHER): Payer: Self-pay

## 2021-04-29 ENCOUNTER — Ambulatory Visit (HOSPITAL_BASED_OUTPATIENT_CLINIC_OR_DEPARTMENT_OTHER)
Admission: RE | Admit: 2021-04-29 | Discharge: 2021-04-29 | Disposition: A | Discharge status: Home / Self Care | Payer: 59 | Source: Ambulatory Visit | Attending: Family Medicine | Admitting: Family Medicine

## 2021-04-29 ENCOUNTER — Other Ambulatory Visit: Payer: Self-pay

## 2021-04-29 DIAGNOSIS — Z1231 Encounter for screening mammogram for malignant neoplasm of breast: Secondary | ICD-10-CM | POA: Insufficient documentation

## 2021-04-30 DIAGNOSIS — M25561 Pain in right knee: Secondary | ICD-10-CM | POA: Diagnosis not present

## 2021-05-10 ENCOUNTER — Ambulatory Visit (INDEPENDENT_AMBULATORY_CARE_PROVIDER_SITE_OTHER): Payer: 59

## 2021-05-10 DIAGNOSIS — I441 Atrioventricular block, second degree: Secondary | ICD-10-CM

## 2021-05-13 LAB — CUP PACEART REMOTE DEVICE CHECK
Battery Remaining Longevity: 42 mo
Battery Remaining Percentage: 44 %
Brady Statistic RA Percent Paced: 26 %
Brady Statistic RV Percent Paced: 100 %
Date Time Interrogation Session: 20220916182100
Implantable Lead Implant Date: 20140626
Implantable Lead Implant Date: 20140626
Implantable Lead Location: 753859
Implantable Lead Location: 753860
Implantable Lead Model: 5076
Implantable Lead Model: 5076
Implantable Pulse Generator Implant Date: 20140626
Lead Channel Impedance Value: 395 Ohm
Lead Channel Impedance Value: 432 Ohm
Lead Channel Pacing Threshold Amplitude: 0.7 V
Lead Channel Pacing Threshold Pulse Width: 0.5 ms
Lead Channel Setting Pacing Amplitude: 2 V
Lead Channel Setting Pacing Amplitude: 2.4 V
Lead Channel Setting Pacing Pulse Width: 0.4 ms
Lead Channel Setting Sensing Sensitivity: 8 mV
Pulse Gen Serial Number: 112476

## 2021-05-15 NOTE — Progress Notes (Signed)
Remote pacemaker transmission.   

## 2021-05-28 ENCOUNTER — Ambulatory Visit (HOSPITAL_BASED_OUTPATIENT_CLINIC_OR_DEPARTMENT_OTHER): Payer: 59

## 2021-06-20 ENCOUNTER — Ambulatory Visit: Payer: 59 | Attending: Internal Medicine

## 2021-06-20 DIAGNOSIS — Z23 Encounter for immunization: Secondary | ICD-10-CM

## 2021-06-20 NOTE — Progress Notes (Signed)
   Covid-19 Vaccination Clinic  Name:  Kari [name]    MRN: [id number] DOB: 27-Mar-1951  06/20/2021  Kari [name] was observed post Covid-19 immunization for 15 minutes without incident. She was provided with Vaccine Information Sheet and instruction to access the V-Safe system.   Kari [name] was instructed to call 911 with any severe reactions post vaccine: Difficulty breathing  Swelling of face and throat  A fast heartbeat  A bad rash all over body  Dizziness and weakness   Immunizations Administered     Name Date Dose VIS Date Route   Pfizer Covid-19 Vaccine Bivalent Booster 06/20/2021  9:42 AM 0.3 mL 04/24/2021 Intramuscular   Manufacturer: [place]   Lot: [id number]   [place]: [phone]

## 2021-07-15 ENCOUNTER — Other Ambulatory Visit (HOSPITAL_BASED_OUTPATIENT_CLINIC_OR_DEPARTMENT_OTHER): Payer: Self-pay

## 2021-07-15 MED ORDER — PFIZER COVID-19 VAC BIVALENT 30 MCG/0.3ML IM SUSP
INTRAMUSCULAR | 0 refills | Status: DC
Start: 2021-06-20 — End: 2022-03-21
  Filled 2021-07-15: qty 0.3, 1d supply, fill #0

## 2021-08-02 ENCOUNTER — Telehealth: Payer: Self-pay | Admitting: Family Medicine

## 2021-08-02 ENCOUNTER — Other Ambulatory Visit (HOSPITAL_COMMUNITY): Payer: Self-pay

## 2021-08-02 ENCOUNTER — Other Ambulatory Visit: Payer: Self-pay | Admitting: Family Medicine

## 2021-08-02 MED ORDER — VALACYCLOVIR HCL 1 G PO TABS
1.0000 g | ORAL_TABLET | Freq: Every day | ORAL | 2 refills | Status: DC | PRN
  Filled 2021-08-02: qty 90, 90d supply, fill #0

## 2021-08-02 NOTE — Telephone Encounter (Signed)
Checked and no record in Epic And no tdap in [place]. She will check with health at work.

## 2021-08-02 NOTE — Telephone Encounter (Signed)
Pt. Would like to know when her last tetanus shot was.

## 2021-08-09 ENCOUNTER — Ambulatory Visit (INDEPENDENT_AMBULATORY_CARE_PROVIDER_SITE_OTHER): Payer: 59

## 2021-08-09 DIAGNOSIS — I441 Atrioventricular block, second degree: Secondary | ICD-10-CM

## 2021-08-12 LAB — CUP PACEART REMOTE DEVICE CHECK
Battery Remaining Longevity: 36 mo
Battery Remaining Percentage: 38 %
Brady Statistic RA Percent Paced: 27 %
Brady Statistic RV Percent Paced: 100 %
Date Time Interrogation Session: 20221216154500
Implantable Lead Implant Date: 20140626
Implantable Lead Implant Date: 20140626
Implantable Lead Location: 753859
Implantable Lead Location: 753860
Implantable Lead Model: 5076
Implantable Lead Model: 5076
Implantable Pulse Generator Implant Date: 20140626
Lead Channel Impedance Value: 402 Ohm
Lead Channel Impedance Value: 453 Ohm
Lead Channel Pacing Threshold Amplitude: 0.7 V
Lead Channel Pacing Threshold Pulse Width: 0.5 ms
Lead Channel Setting Pacing Amplitude: 2 V
Lead Channel Setting Pacing Amplitude: 2.4 V
Lead Channel Setting Pacing Pulse Width: 0.4 ms
Lead Channel Setting Sensing Sensitivity: 8 mV
Pulse Gen Serial Number: 112476

## 2021-08-15 ENCOUNTER — Other Ambulatory Visit (HOSPITAL_COMMUNITY): Payer: Self-pay

## 2021-08-15 MED ORDER — AMOXICILLIN-POT CLAVULANATE 875-125 MG PO TABS
1.0000 | ORAL_TABLET | Freq: Two times a day (BID) | ORAL | 0 refills | Status: DC
  Filled 2021-08-15: qty 14, 7d supply, fill #0

## 2021-08-21 NOTE — Progress Notes (Signed)
Remote pacemaker transmission.   

## 2021-09-19 ENCOUNTER — Encounter (HOSPITAL_BASED_OUTPATIENT_CLINIC_OR_DEPARTMENT_OTHER): Payer: Self-pay | Admitting: Emergency Medicine

## 2021-09-19 ENCOUNTER — Other Ambulatory Visit: Payer: Self-pay

## 2021-09-19 ENCOUNTER — Emergency Department (HOSPITAL_BASED_OUTPATIENT_CLINIC_OR_DEPARTMENT_OTHER)
Admission: EM | Admit: 2021-09-19 | Discharge: 2021-09-19 | Disposition: A | Discharge status: Home / Self Care | Payer: 59 | Attending: Emergency Medicine | Admitting: Emergency Medicine

## 2021-09-19 ENCOUNTER — Other Ambulatory Visit (HOSPITAL_BASED_OUTPATIENT_CLINIC_OR_DEPARTMENT_OTHER): Payer: Self-pay

## 2021-09-19 ENCOUNTER — Emergency Department (HOSPITAL_BASED_OUTPATIENT_CLINIC_OR_DEPARTMENT_OTHER): Payer: 59

## 2021-09-19 DIAGNOSIS — Z950 Presence of cardiac pacemaker: Secondary | ICD-10-CM | POA: Insufficient documentation

## 2021-09-19 DIAGNOSIS — R1032 Left lower quadrant pain: Secondary | ICD-10-CM | POA: Diagnosis not present

## 2021-09-19 DIAGNOSIS — R109 Unspecified abdominal pain: Secondary | ICD-10-CM | POA: Diagnosis not present

## 2021-09-19 DIAGNOSIS — I700 Atherosclerosis of aorta: Secondary | ICD-10-CM | POA: Diagnosis not present

## 2021-09-19 LAB — CBC WITH DIFFERENTIAL/PLATELET
Abs Immature Granulocytes: 0.01 10*3/uL (ref 0.00–0.07)
Basophils Absolute: 0 10*3/uL (ref 0.0–0.1)
Basophils Relative: 0 %
Eosinophils Absolute: 0.1 10*3/uL (ref 0.0–0.5)
Eosinophils Relative: 2 %
HCT: 40.1 % (ref 36.0–46.0)
Hemoglobin: 13.1 g/dL (ref 12.0–15.0)
Immature Granulocytes: 0 %
Lymphocytes Relative: 53 %
Lymphs Abs: 3 10*3/uL (ref 0.7–4.0)
MCH: 30.3 pg (ref 26.0–34.0)
MCHC: 32.7 g/dL (ref 30.0–36.0)
MCV: 92.6 fL (ref 80.0–100.0)
Monocytes Absolute: 0.5 10*3/uL (ref 0.1–1.0)
Monocytes Relative: 10 %
Neutro Abs: 2 10*3/uL (ref 1.7–7.7)
Neutrophils Relative %: 35 %
Platelets: 226 10*3/uL (ref 150–400)
RBC: 4.33 MIL/uL (ref 3.87–5.11)
RDW: 13.6 % (ref 11.5–15.5)
WBC: 5.7 10*3/uL (ref 4.0–10.5)
nRBC: 0 % (ref 0.0–0.2)

## 2021-09-19 LAB — LIPASE, BLOOD: Lipase: 18 U/L (ref 11–51)

## 2021-09-19 LAB — COMPREHENSIVE METABOLIC PANEL
ALT: 30 U/L (ref 0–44)
AST: 33 U/L (ref 15–41)
Albumin: 4.4 g/dL (ref 3.5–5.0)
Alkaline Phosphatase: 73 U/L (ref 38–126)
Anion gap: 9 (ref 5–15)
BUN: 14 mg/dL (ref 8–23)
CO2: 28 mmol/L (ref 22–32)
Calcium: 9.6 mg/dL (ref 8.9–10.3)
Chloride: 102 mmol/L (ref 98–111)
Creatinine, Ser: 0.8 mg/dL (ref 0.44–1.00)
GFR, Estimated: >60 mL/min (ref >60)
Glucose, Bld: 101 mg/dL — ABNORMAL HIGH (ref 70–99)
Potassium: 3.6 mmol/L (ref 3.5–5.1)
Sodium: 139 mmol/L (ref 135–145)
Total Bilirubin: 0.7 mg/dL (ref 0.3–1.2)
Total Protein: 7.4 g/dL (ref 6.5–8.1)

## 2021-09-19 MED ORDER — IOHEXOL 300 MG/ML  SOLN
75.0000 mL | Freq: Once | INTRAMUSCULAR | Status: AC | PRN
  Administered 2021-09-19: 75 mL via INTRAVENOUS

## 2021-09-19 MED ORDER — SENNOSIDES-DOCUSATE SODIUM 8.6-50 MG PO TABS
1.0000 | ORAL_TABLET | Freq: Every day | ORAL | 0 refills | Status: DC
  Filled 2021-09-19: qty 100, 100d supply, fill #0

## 2021-09-19 MED ORDER — DICYCLOMINE HCL 20 MG PO TABS
20.0000 mg | ORAL_TABLET | Freq: Three times a day (TID) | ORAL | 0 refills | Status: DC | PRN
  Filled 2021-09-19: qty 20, 7d supply, fill #0

## 2021-09-19 MED ORDER — HYDROCODONE-ACETAMINOPHEN 5-325 MG PO TABS
1.0000 | ORAL_TABLET | Freq: Four times a day (QID) | ORAL | 0 refills | Status: DC | PRN
  Filled 2021-09-19: qty 10, 3d supply, fill #0

## 2021-09-19 MED ORDER — SODIUM CHLORIDE 0.9 % IV BOLUS
500.0000 mL | Freq: Once | INTRAVENOUS | Status: AC
  Administered 2021-09-19: 500 mL via INTRAVENOUS

## 2021-09-19 NOTE — Discharge Instructions (Signed)
[place] in the [place] with abdominal pain.  I would have you continue the antibiotics as prescribed.  Have also called in some pain medications along with constipation medications.  Please follow closely with your GI team for further evaluation and consideration of repeat colonoscopy.

## 2021-09-19 NOTE — ED Provider Notes (Signed)
Emergency Department Provider Note   I have reviewed the triage vital signs and the nursing notes.   HISTORY  Chief Complaint Abdominal Pain   HPI Kari [name] is a 71 y.o. female with past medical history reviewed below including prior episodes of diverticulitis and sigmoid colectomy presents to the emergency department with left lower quadrant abdominal pain.  Symptoms began this past week.  She called her GI team and was started on Cipro/Flagyl for presumed diverticulitis flare.  Symptoms initially improved but then worsened suddenly.  No fevers the pain is increased.  Pain described as burning and nature.  No urine infection symptoms.  No prior diverticulitis flares since her surgery.  Denies fevers.    Past Medical History:  Diagnosis Date   Arthritis    Chronic back pain    Diverticulitis    DVT (deep venous thrombosis) [facility]) age 26   s/p  knee arthroscopy    Herpes zoster conjunctivitis    Menopause    Mobitz type 2 second degree AV block    2:1/notes 02/16/2013   PONV (postoperative nausea and vomiting)    Presence of permanent cardiac pacemaker    Seasonal allergies    "spring & fall; not q year" (02/17/2013)   Shoulder impingement    and left rotator cuff tear    Review of Systems  Constitutional: No fever/chills Eyes: No visual changes. ENT: No sore throat. Cardiovascular: Denies chest pain. Respiratory: Denies shortness of breath. Gastrointestinal: Positive LLQ abdominal pain.  No nausea, no vomiting.  No diarrhea.  No constipation. Genitourinary: Negative for dysuria. Musculoskeletal: Negative for back pain. Skin: Negative for rash. Neurological: Negative for headaches, focal weakness or numbness.   ____________________________________________   PHYSICAL EXAM:  VITAL SIGNS: ED Triage Vitals  Enc Vitals Group     BP 09/19/21 0729 (!) 179/83     Pulse Rate 09/19/21 0729 61     Resp 09/19/21 0729 18     Temp 09/19/21 0729 97.6 F (36.4  C)     Temp Source 09/19/21 0729 Temporal     SpO2 09/19/21 0729 98 %     Weight 09/19/21 0727 155 lb (70.3 kg)     Height 09/19/21 0727 5\' 8"  (1.727 m)   Constitutional: Alert and oriented. Well appearing and in no acute distress. Eyes: Conjunctivae are normal.  Head: Atraumatic. Nose: No congestion/rhinnorhea. Mouth/Throat: Mucous membranes are moist.  Neck: No stridor.  Cardiovascular: Normal rate, regular rhythm. Good peripheral circulation. Grossly normal heart sounds.   Respiratory: Normal respiratory effort.  No retractions. Lungs CTAB. Gastrointestinal: Soft with focal tenderness in the LLQ of the abdomen. No rebound or guarding. No distention.  Musculoskeletal: No gross deformities of extremities. Neurologic:  Normal speech and language.  Skin:  Skin is warm, dry and intact. No rash noted.   ____________________________________________   LABS (all labs ordered are listed, but only abnormal results are displayed)  Labs Reviewed  COMPREHENSIVE METABOLIC PANEL - Abnormal; Notable for the following components:      Result Value   Glucose, Bld 101 (*)    All other components within normal limits  LIPASE, BLOOD  CBC WITH DIFFERENTIAL/PLATELET   ____________________________________________  RADIOLOGY  CT ABDOMEN PELVIS W CONTRAST  Result Date: 09/19/2021 CLINICAL DATA:  Abdominal pain, acute, nonlocalized. History of sigmoid colectomy. EXAM: CT ABDOMEN AND PELVIS WITH CONTRAST TECHNIQUE: Multidetector CT imaging of the abdomen and pelvis was performed using the standard protocol following bolus administration of intravenous contrast. RADIATION DOSE REDUCTION:  This exam was performed according to the departmental dose-optimization program which includes automated exposure control, adjustment of the mA and/or kV according to patient size and/or use of iterative reconstruction technique. CONTRAST:  78mL OMNIPAQUE IOHEXOL 300 MG/ML  SOLN COMPARISON:  10/19/2014 FINDINGS: Lower  chest: Lung bases are clear. Cardiac pacemaker leads are present. Hepatobiliary: Cholecystectomy. Minimal biliary dilatation is likely secondary to cholecystectomy. Normal appearance of the liver. Main portal venous system is patent. Pancreas: Unremarkable. No pancreatic ductal dilatation or surrounding inflammatory changes. Spleen: Normal in size without focal abnormality. Adrenals/Urinary Tract: Normal adrenal glands. Normal appearance of both kidneys without hydronephrosis. No suspicious renal lesions. Normal appearance of the urinary bladder. Stomach/Bowel: Surgical bowel anastomosis is compatible with history of sigmoid colectomy. Normal appearance of the appendix. Moderate amount of stool in the colon. No evidence for bowel dilatation or focal bowel inflammation. Normal appearance of stomach. Vascular/Lymphatic: Aortic atherosclerosis. No enlarged abdominal or pelvic lymph nodes. Reproductive: Status post hysterectomy. No adnexal masses. Other: Negative for free fluid. Mild irregularity along the anterior abdominal wall likely postoperative in etiology. Negative for free air. Musculoskeletal: Disc space narrowing at L5-S1. Mild disc space disease and endplate changes at K5-L9. No acute bone abnormality. IMPRESSION: 1. No acute abnormality in the abdomen or pelvis. 2. Moderate stool burden. 3. History of sigmoid colectomy. No evidence for bowel obstruction and no focal bowel inflammation. Electronically Signed   By: Markus Daft M.D.   On: 09/19/2021 09:01    ____________________________________________   PROCEDURES  Procedure(s) performed:   Procedures  None ____________________________________________   INITIAL IMPRESSION / ASSESSMENT AND PLAN / ED COURSE  Pertinent labs & imaging results that were available during my care of the patient were reviewed by me and considered in my medical decision making (see chart for details).   This patient is Presenting for Evaluation of abdominal pain,  which does require a range of treatment options, and is a complaint that involves a high risk of morbidity and mortality.  The Differential Diagnoses includes but is not exclusive to acute appendicitis, renal colic, testicular torsion, urinary tract infection, prostatitis,  diverticulitis, small bowel obstruction, colitis, abdominal aortic aneurysm, gastroenteritis, constipation etc.  Critical Interventions- IVF and CT imaging   Medications  sodium chloride 0.9 % bolus 500 mL (500 mLs Intravenous New Bag/Given 09/19/21 0759)  iohexol (OMNIPAQUE) 300 MG/ML solution 75 mL (75 mLs Intravenous Contrast Given 09/19/21 0839)    Reassessment after intervention: Vital signs remain WNL.  I decided to review pertinent External Data, and in summary most recent abdominal CT is 2016 around the time of her sigmoid colectomy w/ Dr. Barry Dienes.    Clinical Laboratory Tests Ordered, included CBC and CMP along with lipase.  No leukocytosis.  No acute kidney injury or electrolyte disturbance.  Lipase is normal.  No anemia.  Radiologic Tests Ordered, included CT abdomen/pelvis. I independently interpreted the images and agree with radiology interpretation.   Cardiac Monitor Tracing which shows NSR.   Social Determinants of Health Risk patient is a non-smoker.   Medical Decision Making: Summary:  Patient presents emergency department with worsening left lower quadrant abdominal pain.  Began empiric treatment for diverticulitis, with history of this in the past, and symptoms seem to get better but then suddenly worsen.  CT imaging obtained showing no acute finding, abscess, microperforation, obstruction.   Reevaluation with update and discussion with patient.  CT imaging essentially within normal limits.  Question if this could have been partially treated diverticulitis.  Plan to continue  her antibiotic treatment and will focus on pain management and GI follow-up for consideration of repeat colonoscopy.  No evidence  of obstruction or internal hernia on CT.   Disposition: discharge  ____________________________________________  FINAL CLINICAL IMPRESSION(S) / ED DIAGNOSES  Final diagnoses:  LLQ abdominal pain     NEW OUTPATIENT MEDICATIONS STARTED DURING THIS VISIT:  Discharge Medication List as of 09/19/2021  9:24 AM     START taking these medications   Details  dicyclomine (BENTYL) 20 MG tablet Take 1 tablet (20 mg total) by mouth 3 (three) times daily as needed for spasms., Starting Thu 09/19/2021, Normal    HYDROcodone-acetaminophen (NORCO/VICODIN) 5-325 MG tablet Take 1 tablet by mouth every 6 (six) hours as needed for severe pain., Starting Thu 09/19/2021, Normal    senna-docusate (SENOKOT-S) 8.6-50 MG tablet Take 1 tablet by mouth daily., Starting Thu 09/19/2021, Normal        Note:  This document was prepared using Dragon voice recognition software and may include unintentional dictation errors.  Nanda Quinton, MD, [facility]    Sylena Lotter, [place], MD 09/20/21 [phone]

## 2021-09-19 NOTE — ED Notes (Signed)
Dc instructions reviewed with patient. Patient voiced understanding. Dc with belongings.  °

## 2021-09-19 NOTE — ED Triage Notes (Signed)
Pt arrives to ED with c/o abdominal pain. Associated symptoms include diarrhea. Pt with hx of diverticulitis. Pt is currently on Flagl/Cipro since 1/22. Pain is currently located LLQ.

## 2021-09-19 NOTE — ED Notes (Signed)
Patient transported to CT 

## 2021-09-22 ENCOUNTER — Telehealth: Payer: 59 | Admitting: Physician Assistant

## 2021-09-22 DIAGNOSIS — R3989 Other symptoms and signs involving the genitourinary system: Secondary | ICD-10-CM

## 2021-09-23 MED ORDER — NITROFURANTOIN MONOHYD MACRO 100 MG PO CAPS: 100.0000 mg | ORAL_CAPSULE | Freq: Two times a day (BID) | ORAL | 0 refills | Status: DC

## 2021-09-23 MED ORDER — FLUCONAZOLE 150 MG PO TABS: 150.0000 mg | ORAL_TABLET | Freq: Once | ORAL | 0 refills | Status: AC

## 2021-09-23 NOTE — Progress Notes (Signed)
E-Visit for Urinary Problems  We are sorry that you are not feeling well.  Here is how we plan to help!  Based on what you shared with me it looks like you most likely have a simple urinary tract infection.  A UTI (Urinary Tract Infection) is a bacterial infection of the bladder.  Most cases of urinary tract infections are simple to treat but a key part of your care is to encourage you to drink plenty of fluids and watch your symptoms carefully.  I have prescribed Macrobid 100mg .  Your symptoms should gradually improve. Call us if the burning in your urine worsens, you develop worsening fever, back pain or pelvic pain or if your symptoms do not resolve after completing the antibiotic.  Urinary tract infections can be prevented by drinking plenty of water to keep your body hydrated.  Also be sure when you wipe, wipe from front to back and don't hold it in!  If possible, empty your bladder every 4 hours.  HOME CARE Drink plenty of fluids Compete the full course of the antibiotics even if the symptoms resolve Remember, when you need to gogo. Holding in your urine can increase the likelihood of getting a UTI! GET HELP RIGHT AWAY IF: You cannot urinate You get a high fever Worsening back pain occurs You see blood in your urine You feel sick to your stomach or throw up You feel like you are going to pass out  MAKE SURE YOU  Understand these instructions. Will watch your condition. Will get help right away if you are not doing well or get worse.   Thank you for choosing an e-visit.  Your e-visit answers were reviewed by a board certified advanced clinical practitioner to complete your personal care plan. Depending upon the condition, your plan could have included both over the counter or prescription medications.  Please review your pharmacy choice. Make sure the pharmacy is open so you can pick up prescription now. If there is a problem, you may contact your provider through [employer] and have the prescription routed to another pharmacy.  Your safety is important to [place]. If you have drug allergies check your prescription carefully.   For the next 24 hours you can use MyChart to ask questions about today's visit, request a non-urgent call back, or ask for a work or school excuse. You will get an email in the next two days asking about your experience. I hope that your e-visit has been valuable and will speed your recovery.  I provided 5 minutes of non face-to-face time during this encounter for chart review and documentation.

## 2021-09-23 NOTE — Addendum Note (Signed)
Addended by: Mar Daring on: 09/23/2021 07:34 AM   Modules accepted: Orders

## 2021-09-30 ENCOUNTER — Telehealth: Payer: 59 | Admitting: Physician Assistant

## 2021-09-30 DIAGNOSIS — B9789 Other viral agents as the cause of diseases classified elsewhere: Secondary | ICD-10-CM

## 2021-09-30 DIAGNOSIS — J019 Acute sinusitis, unspecified: Secondary | ICD-10-CM

## 2021-09-30 MED ORDER — AZELASTINE HCL 0.1 % NA SOLN: 1.0000 | Freq: Two times a day (BID) | NASAL | 0 refills | Status: DC

## 2021-09-30 NOTE — Progress Notes (Signed)
E-Visit for Sinus Problems  We are sorry that you are not feeling well.  Here is how we plan to help!  Based on what you have shared with me it looks like you have sinusitis.  Sinusitis is inflammation and infection in the sinus cavities of the head.  Based on your presentation I believe you most likely have Acute Viral Sinusitis.This is an infection most likely caused by a virus. There is not specific treatment for viral sinusitis other than to help you with the symptoms until the infection runs its course.  You may use an oral decongestant such as Mucinex D or if you have glaucoma or high blood pressure use plain Mucinex. Saline nasal spray help and can safely be used as often as needed for congestion, I have prescribed: Azelastine nasal spray 2 sprays in each nostril twice a day. I want you to take this along with your Flonase, nasal steroid spray.   Some authorities believe that zinc sprays or the use of Echinacea may shorten the course of your symptoms.  Sinus infections are not as easily transmitted as other respiratory infection, however we still recommend that you avoid close contact with loved ones, especially the very young and elderly.  Remember to wash your hands thoroughly throughout the day as this is the number one way to prevent the spread of infection!  Home Care: Only take medications as instructed by your medical team. Do not take these medications with alcohol. A steam or ultrasonic humidifier can help congestion.  You can place a towel over your head and breathe in the steam from hot water coming from a faucet. Avoid close contacts especially the very young and the elderly. Cover your mouth when you cough or sneeze. Always remember to wash your hands.  Get Help Right Away If: You develop worsening fever or sinus pain. You develop a severe head ache or visual changes. Your symptoms persist after you have completed your treatment plan.  Make sure you Understand these  instructions. Will watch your condition. Will get help right away if you are not doing well or get worse.   Thank you for choosing an e-visit.  Your e-visit answers were reviewed by a board certified advanced clinical practitioner to complete your personal care plan. Depending upon the condition, your plan could have included both over the counter or prescription medications.  Please review your pharmacy choice. Make sure the pharmacy is open so you can pick up prescription now. If there is a problem, you may contact your provider through [employer] and have the prescription routed to another pharmacy.  Your safety is important to [place]. If you have drug allergies check your prescription carefully.   For the next 24 hours you can use MyChart to ask questions about today's visit, request a non-urgent call back, or ask for a work or school excuse. You will get an email in the next two days asking about your experience. I hope that your e-visit has been valuable and will speed your recovery.

## 2021-09-30 NOTE — Progress Notes (Signed)
I have spent 5 minutes in review of e-visit questionnaire, review and updating patient chart, medical decision making and response to patient.   Nykia Turko Cody Kylie Gros, PA-C    

## 2021-11-08 ENCOUNTER — Ambulatory Visit: Payer: 59

## 2021-11-11 LAB — CUP PACEART REMOTE DEVICE CHECK
Battery Remaining Longevity: 30 mo
Battery Remaining Percentage: 33 %
Brady Statistic RA Percent Paced: 27 %
Brady Statistic RV Percent Paced: 100 %
Date Time Interrogation Session: 20230317162800
Implantable Lead Implant Date: 20140626
Implantable Lead Implant Date: 20140626
Implantable Lead Location: 753859
Implantable Lead Location: 753860
Implantable Lead Model: 5076
Implantable Lead Model: 5076
Implantable Pulse Generator Implant Date: 20140626
Lead Channel Impedance Value: 410 Ohm
Lead Channel Impedance Value: 468 Ohm
Lead Channel Pacing Threshold Amplitude: 0.7 V
Lead Channel Pacing Threshold Pulse Width: 0.5 ms
Lead Channel Setting Pacing Amplitude: 2 V
Lead Channel Setting Pacing Amplitude: 2.4 V
Lead Channel Setting Pacing Pulse Width: 0.4 ms
Lead Channel Setting Sensing Sensitivity: 8 mV
Pulse Gen Serial Number: 112476

## 2022-01-07 ENCOUNTER — Telehealth: Payer: 59 | Admitting: Physician Assistant

## 2022-01-07 DIAGNOSIS — R3989 Other symptoms and signs involving the genitourinary system: Secondary | ICD-10-CM | POA: Diagnosis not present

## 2022-01-07 MED ORDER — NITROFURANTOIN MONOHYD MACRO 100 MG PO CAPS: 100.0000 mg | ORAL_CAPSULE | Freq: Two times a day (BID) | ORAL | 0 refills | Status: DC

## 2022-01-07 MED ORDER — FLUCONAZOLE 150 MG PO TABS: 150.0000 mg | ORAL_TABLET | Freq: Once | ORAL | 0 refills | Status: AC

## 2022-01-07 MED ORDER — PHENAZOPYRIDINE HCL 100 MG PO TABS: 100.0000 mg | ORAL_TABLET | Freq: Three times a day (TID) | ORAL | 0 refills | Status: DC | PRN

## 2022-01-07 NOTE — Progress Notes (Signed)
I have spent 5 minutes in review of e-visit questionnaire, review and updating patient chart, medical decision making and response to patient.   Davinity Fanara Cody Oceane Fosse, PA-C    

## 2022-01-07 NOTE — Addendum Note (Signed)
Addended by: Brunetta Jeans on: 01/07/2022 09:18 AM ? ? Modules accepted: Orders ? ?

## 2022-01-07 NOTE — Progress Notes (Signed)

## 2022-01-27 ENCOUNTER — Encounter (INDEPENDENT_AMBULATORY_CARE_PROVIDER_SITE_OTHER): Payer: 59 | Admitting: Family Medicine

## 2022-01-27 DIAGNOSIS — J069 Acute upper respiratory infection, unspecified: Secondary | ICD-10-CM | POA: Diagnosis not present

## 2022-01-28 ENCOUNTER — Ambulatory Visit
Admission: RE | Admit: 2022-01-28 | Discharge: 2022-01-28 | Discharge status: Against Medical Advice | Payer: 59 | Source: Ambulatory Visit

## 2022-01-28 MED ORDER — NITROFURANTOIN MONOHYD MACRO 100 MG PO CAPS: 100.0000 mg | ORAL_CAPSULE | Freq: Two times a day (BID) | ORAL | 0 refills | Status: DC

## 2022-01-28 NOTE — Telephone Encounter (Signed)
Pt had E visit on 5/16 for suspected UTI. Usually gets 10 d of Macrobid, got 5 and only partially improved. Still having s/s's. Will send in 10 days of Macrobid as she tolerates this well and was improving.   Please see the MyChart message reply(ies) for my assessment and plan.  The patient gave consent for this Medical Advice Message and is aware that it may result in a bill to their insurance company as well as the possibility that this may result in a co-payment or deductible. They are an established patient, but are not seeking medical advice exclusively about a problem treated during an in person or video visit in the last 7 days. I did not recommend an in person or video visit within 7 days of my reply.  I spent a total of 6 minutes cumulative time within 7 days through [employer], DO

## 2022-02-07 ENCOUNTER — Ambulatory Visit (INDEPENDENT_AMBULATORY_CARE_PROVIDER_SITE_OTHER): Payer: 59

## 2022-02-07 DIAGNOSIS — I441 Atrioventricular block, second degree: Secondary | ICD-10-CM | POA: Diagnosis not present

## 2022-02-08 LAB — CUP PACEART REMOTE DEVICE CHECK
Battery Remaining Longevity: 24 mo
Battery Remaining Percentage: 27 %
Brady Statistic RA Percent Paced: 28 %
Brady Statistic RV Percent Paced: 100 %
Date Time Interrogation Session: 20230616180000
Implantable Lead Implant Date: 20140626
Implantable Lead Implant Date: 20140626
Implantable Lead Location: 753859
Implantable Lead Location: 753860
Implantable Lead Model: 5076
Implantable Lead Model: 5076
Implantable Pulse Generator Implant Date: 20140626
Lead Channel Impedance Value: 398 Ohm
Lead Channel Impedance Value: 442 Ohm
Lead Channel Pacing Threshold Amplitude: 0.7 V
Lead Channel Pacing Threshold Pulse Width: 0.5 ms
Lead Channel Setting Pacing Amplitude: 2 V
Lead Channel Setting Pacing Amplitude: 2.4 V
Lead Channel Setting Pacing Pulse Width: 0.4 ms
Lead Channel Setting Sensing Sensitivity: 8 mV
Pulse Gen Serial Number: 112476

## 2022-02-13 NOTE — Progress Notes (Signed)
Remote pacemaker transmission.   

## 2022-02-26 ENCOUNTER — Encounter: Payer: 59 | Admitting: Family Medicine

## 2022-03-07 ENCOUNTER — Encounter: Payer: Self-pay | Admitting: Family Medicine

## 2022-03-07 ENCOUNTER — Ambulatory Visit (INDEPENDENT_AMBULATORY_CARE_PROVIDER_SITE_OTHER): Payer: 59 | Admitting: Family Medicine

## 2022-03-07 VITALS — BP 110/84 | HR 67 | Temp 98.1°F | Ht 68.0 in

## 2022-03-07 DIAGNOSIS — Z0000 Encounter for general adult medical examination without abnormal findings: Secondary | ICD-10-CM

## 2022-03-07 DIAGNOSIS — I700 Atherosclerosis of aorta: Secondary | ICD-10-CM | POA: Insufficient documentation

## 2022-03-07 DIAGNOSIS — R6889 Other general symptoms and signs: Secondary | ICD-10-CM

## 2022-03-07 DIAGNOSIS — Z23 Encounter for immunization: Secondary | ICD-10-CM | POA: Diagnosis not present

## 2022-03-07 NOTE — Patient Instructions (Addendum)
Give [place] 2-3 business days to get the results of your labs back.   Keep the diet clean and stay active.  Please get me a copy of your advanced directive form at your convenience.   With the diagnosis of aortic atherosclerosis, we do recommend a statin like Lipitor or Crestor. Let me know if you are intersected in starting something like this or another statin if you prefer.   Foods that may reduce pain: 1) Ginger 2) Blueberries 3) Salmon 4) Pumpkin seeds 5) dark chocolate 6) turmeric 7) tart cherries 8) virgin olive oil 9) chilli peppers 10) mint 11) krill oil   Let us know if you need anything.

## 2022-03-07 NOTE — Progress Notes (Signed)
Chief Complaint  Patient presents with   Annual Exam     Well Kari [name] is here for a complete physical.   Her last physical was >1 year ago.  Current diet: in general, a "healthy" diet. Current exercise: active in yard, walking, treadmill. Weight is stable and she denies fatigue out of ordinary. Seatbelt? Yes Advanced directive? No  Health Maintenance Colonoscopy- Yes Shingrix- No DEXA- Yes Mammogram- Yes Tetanus- Yes Pneumonia- Due for PCV20 Hep C screen- Yes  Past Medical History:  Diagnosis Date   Arthritis    Chronic back pain    Diverticulitis    DVT (deep venous thrombosis) [facility]) age 36   s/p  knee arthroscopy    Herpes zoster conjunctivitis    Menopause    Mobitz type 2 second degree AV block    2:1/notes 02/16/2013   PONV (postoperative nausea and vomiting)    Presence of permanent cardiac pacemaker    Seasonal allergies    "spring & fall; not q year" (02/17/2013)   Shoulder impingement    and left rotator cuff tear     Past Surgical History:  Procedure Laterality Date   ACHILLES TENDON SURGERY     ARTHROGRAM KNEE Right 1975   CHOLECYSTECTOMY  1990's   colonscopy     ESOPHAGOGASTRODUODENOSCOPY     EYE SURGERY     muscle release right eye   INSERT / REPLACE / REMOVE PACEMAKER  02/17/2013   [employer] Advantio dual-chamber pacemaker, model [id number],   KNEE ARTHROSCOPY Right 1982, 1983, 1995, 1996   KNEE ARTHROSCOPY W/ ACL RECONSTRUCTION Left 1984   LAPAROSCOPIC SIGMOID COLECTOMY N/A 01/01/2015   Procedure: LAPAROSCOPIC SIGMOID COLECTOMY;  Surgeon: Stark Klein, MD;  Location: [place];  Service: General;  Laterality: N/A;   PERMANENT PACEMAKER INSERTION N/A 02/17/2013   Procedure: PERMANENT PACEMAKER INSERTION;  Surgeon: Evans Lance, MD;  Location: [facility] CATH LAB;  Service: Cardiovascular;  Laterality: N/A;   SHOULDER ARTHROSCOPY Left 09/05/2016   Procedure: LEFT SHOULDER ARTHROSCOPY , ACROMIOPLASTY AND ROTATOR CUFF REPAIR;  Surgeon:  Melrose Nakayama, MD;  Location: [place];  Service: Orthopedics;  Laterality: Left;   TONSILLECTOMY  1961   TOTAL KNEE ARTHROPLASTY Right 2000; 06/2000   "replaced w/appropriate hardware" (02/17/2013)   TOTAL KNEE REVISION Right 04/13/2019   Procedure: Revision Right Knee Arthroplasty;  Surgeon: Frederik Pear, MD;  Location: [facility];  Service: Orthopedics;  Laterality: Right;   TRIGGER FINGER RELEASE Right ~ 2012   "thumb" (02/17/2013)   VAGINAL HYSTERECTOMY  ~ 1994   partial     Medications  Current Outpatient Medications on File Prior to Visit  Medication Sig Dispense Refill   Ascorbic Acid (VITAMIN C) 100 MG tablet Take 200 mg by mouth daily.     aspirin EC 81 MG tablet Take 81 mg by mouth daily.     Calcium Carb-Cholecalciferol (CALCIUM 600+D3 PO) Take 1 tablet by mouth daily.     COVID-19 mRNA bivalent vaccine, Pfizer, (PFIZER COVID-19 VAC BIVALENT) injection Inject into the muscle. 0.3 mL 0   COVID-19 mRNA Vac-TriS, Pfizer, SUSP injection Inject into the muscle. 0.3 mL 0   Cyanocobalamin (VITAMIN B-12) 2500 MCG SUBL Place 2,500 mcg under the tongue daily.      diclofenac sodium (VOLTAREN) 1 % GEL Apply 1-2 g topically 4 (four) times daily as needed (knee pain.).     Melatonin 3 MG TABS Take 6 mg by mouth at bedtime as needed (sleep.).     Multiple Vitamin (  MULTIVITAMIN WITH MINERALS) TABS Take 1 tablet by mouth daily.     Omega-3 Fatty Acids (FISH OIL ULTRA) 1400 MG CAPS Take 1,400 mg by mouth daily.     phenazopyridine (PYRIDIUM) 100 MG tablet Take 1 tablet (100 mg total) by mouth 3 (three) times daily as needed for pain. 10 tablet 0   Probiotic Product (PROBIOTIC FORMULA PO) Take 1 capsule by mouth daily.     senna-docusate (SENOKOT-S) 8.6-50 MG tablet Take 1 tablet by mouth daily. 100 tablet 0   valACYclovir (VALTREX) 1000 MG tablet Take 1 tablet (1,000 mg total) by mouth daily as needed. 90 tablet 2   Allergies Allergies  Allergen Reactions   Ceftin Itching    Tolerates amoxicillin    Vancomycin Hives   Bactrim [Sulfamethoxazole-Trimethoprim] Hives   Levofloxacin Other (See Comments)    "spacey," tolerates Cipro    Review of Systems: Constitutional:  no fevers Eye:  no recent significant change in vision Ears:  No changes in hearing Nose/Mouth/Throat:  no complaints of nasal congestion, no sore throat Cardiovascular: no chest pain Respiratory:  No shortness of breath Gastrointestinal:  No change in bowel habits GU:  Female: negative for dysuria Integumentary:  no abnormal skin lesions reported Neurologic:  no headaches Endocrine:  denies unexplained weight changes  Exam BP 110/84   Pulse 67   Temp 98.1 F (36.7 C) (Oral)   Ht '5\' 8"'$  (1.727 m)   SpO2 97%   BMI 23.57 kg/m  General:  well developed, well nourished, in no apparent distress Skin:  no significant moles, warts, or growths Head:  no masses, lesions, or tenderness Eyes:  pupils equal and round, sclera anicteric without injection Ears:  canals without lesions, TMs shiny without retraction, no obvious effusion, no erythema Nose:  nares patent, septum midline, mucosa normal, and no drainage or sinus tenderness Throat/Pharynx:  lips and gingiva without lesion; tongue and uvula midline; non-inflamed pharynx; no exudates or postnasal drainage Neck: neck supple without adenopathy, thyromegaly, or masses Lungs:  clear to auscultation, breath sounds equal bilaterally, no respiratory distress Cardio:  regular rate and rhythm, no bruits or LE edema Abdomen:  abdomen soft, nontender; bowel sounds normal; no masses or organomegaly Genital: Deferred Neuro:  gait normal; deep tendon reflexes normal and symmetric Psych: well oriented with normal range of affect and appropriate judgment/insight  Assessment and Plan  Well adult exam - Plan: Lipid panel, CANCELED: Lipid panel  Heat intolerance - Plan: TSH, CANCELED: TSH  Aortic atherosclerosis (HCC)  Need for vaccination against Streptococcus pneumoniae -  Plan: Pneumococcal conjugate vaccine 20-valent (Prevnar 33)   Well 71 y.o. female. Counseled on diet and exercise. Advanced directive form requested today.  Discussed statin therapy for aortic atherosclerosis.  Shingrix rec'd.  PCV 20 today. Other orders as above. Follow up in 1 yr or prn. The patient voiced understanding and agreement to the plan.  [place], DO 03/07/22 2:53 PM

## 2022-03-08 ENCOUNTER — Encounter: Payer: Self-pay | Admitting: Family Medicine

## 2022-03-08 LAB — LIPID PANEL
Cholesterol: 186 mg/dL (ref <200)
HDL: 56 mg/dL (ref >=50)
LDL Cholesterol (Calc): 104 mg/dL (calc) — ABNORMAL HIGH
Non-HDL Cholesterol (Calc): 130 mg/dL (calc) — ABNORMAL HIGH (ref <130)
Total CHOL/HDL Ratio: 3.3 (calc) (ref <5.0)
Triglycerides: 147 mg/dL (ref <150)

## 2022-03-08 LAB — TSH: TSH: 1.82 mIU/L (ref 0.40–4.50)

## 2022-03-10 ENCOUNTER — Other Ambulatory Visit (HOSPITAL_BASED_OUTPATIENT_CLINIC_OR_DEPARTMENT_OTHER): Payer: Self-pay

## 2022-03-10 ENCOUNTER — Other Ambulatory Visit: Payer: Self-pay | Admitting: Family Medicine

## 2022-03-10 DIAGNOSIS — E785 Hyperlipidemia, unspecified: Secondary | ICD-10-CM

## 2022-03-10 MED ORDER — ROSUVASTATIN CALCIUM 5 MG PO TABS
5.0000 mg | ORAL_TABLET | Freq: Every day | ORAL | 3 refills | Status: DC
  Filled 2022-03-10: qty 30, 30d supply, fill #0
  Filled 2022-04-18: qty 30, 30d supply, fill #1
  Filled 2022-04-21 – 2022-05-23 (×2): qty 30, 30d supply, fill #0
  Filled 2022-05-23: qty 60, 60d supply, fill #0

## 2022-03-11 ENCOUNTER — Ambulatory Visit: Payer: Self-pay

## 2022-03-11 ENCOUNTER — Encounter: Payer: Self-pay | Admitting: Family Medicine

## 2022-03-11 ENCOUNTER — Other Ambulatory Visit: Payer: Self-pay | Admitting: Family Medicine

## 2022-03-11 DIAGNOSIS — M542 Cervicalgia: Secondary | ICD-10-CM

## 2022-03-11 MED ORDER — NITROFURANTOIN MONOHYD MACRO 100 MG PO CAPS: 100.0000 mg | ORAL_CAPSULE | Freq: Two times a day (BID) | ORAL | 0 refills | Status: DC

## 2022-03-17 ENCOUNTER — Ambulatory Visit (INDEPENDENT_AMBULATORY_CARE_PROVIDER_SITE_OTHER): Payer: 59

## 2022-03-17 ENCOUNTER — Ambulatory Visit: Payer: 59 | Admitting: Orthopaedic Surgery

## 2022-03-17 DIAGNOSIS — M25552 Pain in left hip: Secondary | ICD-10-CM

## 2022-03-17 NOTE — Progress Notes (Signed)
Office Visit Note   Patient: Kari [name]           Date of Birth: Feb 11, 1951           MRN: [id number] Visit Date: 03/17/2022              Requested by: Shelda Pal, [address] [place],  [place] [address] PCP: Shelda Pal, DO   Assessment & Plan: Visit Diagnoses:  1. Left hip pain     Plan: I would like to send her to my partner Dr. Sammuel Hines for further evaluation and treatment of her left hip.  Mainly this will be an assessment hopefully using ultrasound to see if there is any pathology that he can see with her left hip since she cannot have a MRI.  He may end up even trying a therapeutic/diagnostic steroid injection and she understands this as well.  She agrees with this referral.  Follow-Up Instructions: No follow-ups on file.   Orders:  Orders Placed This Encounter  Procedures   XR HIP UNILAT W OR W/O PELVIS 1V LEFT   No orders of the defined types were placed in this encounter.     Procedures: No procedures performed   Clinical Data: No additional findings.   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  She mainly works at [employer] more recently.  About 3 months ago she felt a catch in the left hip and does get a popping sensation.  She says it really started to slow her down and she points to the lateral aspect of her hip and some in the groin as a source of that pain.  She cannot have a MRI secondary to I believe the prosthetic valve.  She does walk with a slight limp.  She says the more activities that she performs that is slowly getting worse for her.  She denies any radicular symptoms or any back issues at all.  It seems to be centered around her left hip in terms of the pain she is having.  She was very active and athletic growing up.  HPI  Review of Systems There is currently listed  no fever, chills, nausea, vomiting  Objective: Vital Signs: There were no vitals taken for this visit.  Physical Exam She is alert and orient x3 and in no acute distress Ortho Exam Examination of her left hip shows it moves smoothly and fluidly.  There is some pain in the hip on the extremes of internal and external rotation.  She is tender to palpation all around the tip and just posterior to the greater trochanter on that left side but not at the sciatic region.  This seems to be more related to her hip itself. Specialty Comments:  No specialty comments available.  Imaging: XR HIP UNILAT W OR W/O PELVIS 1V LEFT  Result Date: 03/17/2022 An AP pelvis and lateral left hip shows no acute gross findings.  There is just slight signs of femoral acetabular impingement with a superior lateral bone spur off the femoral head which is present on the right hip.  There is also a small osteophyte just superior and posterior to the acetabulum.  The joint space itself is congruent.    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   Painful total knee replacement, right ([place]) 04/12/2019   Near syncope 03/11/2013   Bradycardia 02/21/2013   S/P cardiac pacemaker procedure 02/20/2013   Chest tightness 02/19/2013   Palpitations 02/19/2013   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   Diverticulitis 03/03/2012   Chronic low back pain 08/28/2011   GERD (gastroesophageal reflux disease)    Herpes zoster conjunctivitis    Menopause    Arthritis    Diverticulosis    Allergy    Past Medical History:  Diagnosis Date   Arthritis    Chronic back pain    Diverticulitis    DVT (deep venous thrombosis) [facility]) age 46   s/p  knee arthroscopy    Herpes zoster conjunctivitis    Menopause    Mobitz type 2 second degree AV block    2:1/notes 02/16/2013   PONV  (postoperative nausea and vomiting)    Presence of permanent cardiac pacemaker    Seasonal allergies    "spring & fall; not q year" (02/17/2013)   Shoulder impingement    and left rotator cuff tear    Family History  Problem Relation Age of Onset   Hypertension Mother    Hyperlipidemia Mother    Macular degeneration Mother    COPD Mother    Lung cancer Father    Cancer Father        Lung   COPD Father    Diabetes Brother    Breast cancer Paternal Aunt     Past Surgical History:  Procedure Laterality Date   ACHILLES TENDON SURGERY     ARTHROGRAM KNEE Right 1975   CHOLECYSTECTOMY  1990's   colonscopy     ESOPHAGOGASTRODUODENOSCOPY     EYE SURGERY     muscle release right eye   INSERT / REPLACE / REMOVE PACEMAKER  02/17/2013   [employer] Advantio dual-chamber pacemaker, model [id number],   KNEE ARTHROSCOPY Right 1982, 1983, 1995, 1996   KNEE ARTHROSCOPY W/ ACL RECONSTRUCTION Left 1984   LAPAROSCOPIC SIGMOID COLECTOMY N/A 01/01/2015   Procedure: LAPAROSCOPIC SIGMOID COLECTOMY;  Surgeon: Stark Klein, MD;  Location: [place];  Service: General;  Laterality: N/A;   PERMANENT PACEMAKER INSERTION N/A 02/17/2013   Procedure: PERMANENT PACEMAKER INSERTION;  Surgeon: Evans Lance, MD;  [facility] CATH [facility];  Service: Cardiovascular;  Laterality: N/A;   SHOULDER ARTHROSCOPY Left 09/05/2016   Procedure: LEFT SHOULDER ARTHROSCOPY , ACROMIOPLASTY AND ROTATOR CUFF REPAIR;  Surgeon: Melrose Nakayama, MD;  Location: [place];  Service: Orthopedics;  Laterality: Left;   TONSILLECTOMY  1961   TOTAL KNEE ARTHROPLASTY Right 2000; 06/2000   "replaced w/appropriate hardware" (02/17/2013)   TOTAL KNEE REVISION Right 04/13/2019   Procedure: Revision Right Knee Arthroplasty;  Surgeon: Frederik Pear, MD;  [facility];  Service: Orthopedics;  Laterality: Right;   TRIGGER FINGER RELEASE Right ~ 2012   "thumb" (02/17/2013)   VAGINAL HYSTERECTOMY  ~ 1994   partial    Social History   Occupational  History   Occupation: Programmer, multimedia: [employer]  Tobacco Use   Smoking status: Never   Smokeless tobacco: Never  Substance and Sexual Activity   Alcohol use: Yes    Comment: occasional wine    Drug use: No   Sexual activity: Yes    Birth control/protection: Surgical    Comment: 02/17/2013 "I have a same sex partner"

## 2022-03-20 ENCOUNTER — Other Ambulatory Visit: Payer: Self-pay

## 2022-03-20 ENCOUNTER — Encounter (HOSPITAL_BASED_OUTPATIENT_CLINIC_OR_DEPARTMENT_OTHER): Payer: Self-pay | Admitting: Emergency Medicine

## 2022-03-20 ENCOUNTER — Observation Stay (HOSPITAL_BASED_OUTPATIENT_CLINIC_OR_DEPARTMENT_OTHER)
Admission: EM | Admit: 2022-03-20 | Discharge: 2022-03-21 | Disposition: A | Discharge status: Home / Self Care | Payer: 59 | Attending: Cardiology | Admitting: Cardiology

## 2022-03-20 ENCOUNTER — Emergency Department (HOSPITAL_BASED_OUTPATIENT_CLINIC_OR_DEPARTMENT_OTHER): Payer: 59

## 2022-03-20 DIAGNOSIS — Z96651 Presence of right artificial knee joint: Secondary | ICD-10-CM | POA: Diagnosis not present

## 2022-03-20 DIAGNOSIS — Z79899 Other long term (current) drug therapy: Secondary | ICD-10-CM | POA: Insufficient documentation

## 2022-03-20 DIAGNOSIS — Z86718 Personal history of other venous thrombosis and embolism: Secondary | ICD-10-CM | POA: Diagnosis not present

## 2022-03-20 DIAGNOSIS — Z7982 Long term (current) use of aspirin: Secondary | ICD-10-CM | POA: Insufficient documentation

## 2022-03-20 DIAGNOSIS — T829XXA Unspecified complication of cardiac and vascular prosthetic device, implant and graft, initial encounter: Secondary | ICD-10-CM | POA: Diagnosis present

## 2022-03-20 DIAGNOSIS — I442 Atrioventricular block, complete: Secondary | ICD-10-CM

## 2022-03-20 DIAGNOSIS — T82110A Breakdown (mechanical) of cardiac electrode, initial encounter: Secondary | ICD-10-CM | POA: Diagnosis not present

## 2022-03-20 DIAGNOSIS — R55 Syncope and collapse: Secondary | ICD-10-CM | POA: Diagnosis not present

## 2022-03-20 DIAGNOSIS — T82111A Breakdown (mechanical) of cardiac pulse generator (battery), initial encounter: Secondary | ICD-10-CM

## 2022-03-20 DIAGNOSIS — I639 Cerebral infarction, unspecified: Secondary | ICD-10-CM | POA: Diagnosis not present

## 2022-03-20 DIAGNOSIS — R200 Anesthesia of skin: Secondary | ICD-10-CM | POA: Diagnosis present

## 2022-03-20 DIAGNOSIS — Z20822 Contact with and (suspected) exposure to covid-19: Secondary | ICD-10-CM | POA: Insufficient documentation

## 2022-03-20 DIAGNOSIS — Z950 Presence of cardiac pacemaker: Secondary | ICD-10-CM | POA: Insufficient documentation

## 2022-03-20 LAB — DIFFERENTIAL
Abs Immature Granulocytes: 0.01 10*3/uL (ref 0.00–0.07)
Basophils Absolute: 0 10*3/uL (ref 0.0–0.1)
Basophils Relative: 0 %
Eosinophils Absolute: 0.1 10*3/uL (ref 0.0–0.5)
Eosinophils Relative: 1 %
Immature Granulocytes: 0 %
Lymphocytes Relative: 50 %
Lymphs Abs: 3.5 10*3/uL (ref 0.7–4.0)
Monocytes Absolute: 0.6 10*3/uL (ref 0.1–1.0)
Monocytes Relative: 9 %
Neutro Abs: 2.9 10*3/uL (ref 1.7–7.7)
Neutrophils Relative %: 40 %

## 2022-03-20 LAB — COMPREHENSIVE METABOLIC PANEL
ALT: 20 U/L (ref 0–44)
AST: 25 U/L (ref 15–41)
Albumin: 4.2 g/dL (ref 3.5–5.0)
Alkaline Phosphatase: 74 U/L (ref 38–126)
Anion gap: 10 (ref 5–15)
BUN: 14 mg/dL (ref 8–23)
CO2: 25 mmol/L (ref 22–32)
Calcium: 9.3 mg/dL (ref 8.9–10.3)
Chloride: 103 mmol/L (ref 98–111)
Creatinine, Ser: 0.77 mg/dL (ref 0.44–1.00)
GFR, Estimated: >60 mL/min (ref >60)
Glucose, Bld: 135 mg/dL — ABNORMAL HIGH (ref 70–99)
Potassium: 3.2 mmol/L — ABNORMAL LOW (ref 3.5–5.1)
Sodium: 138 mmol/L (ref 135–145)
Total Bilirubin: 1.1 mg/dL (ref 0.3–1.2)
Total Protein: 7.7 g/dL (ref 6.5–8.1)

## 2022-03-20 LAB — CBC
HCT: 40.2 % (ref 36.0–46.0)
Hemoglobin: 13.4 g/dL (ref 12.0–15.0)
MCH: 31 pg (ref 26.0–34.0)
MCHC: 33.3 g/dL (ref 30.0–36.0)
MCV: 93.1 fL (ref 80.0–100.0)
Platelets: 244 10*3/uL (ref 150–400)
RBC: 4.32 MIL/uL (ref 3.87–5.11)
RDW: 13.7 % (ref 11.5–15.5)
WBC: 7.2 10*3/uL (ref 4.0–10.5)
nRBC: 0 % (ref 0.0–0.2)

## 2022-03-20 LAB — URINALYSIS, ROUTINE W REFLEX MICROSCOPIC
Bilirubin Urine: NEGATIVE
Glucose, UA: NEGATIVE mg/dL
Hgb urine dipstick: NEGATIVE
Ketones, ur: NEGATIVE mg/dL
Leukocytes,Ua: NEGATIVE
Nitrite: NEGATIVE
Protein, ur: NEGATIVE mg/dL
Specific Gravity, Urine: <=1.005 (ref 1.005–1.030)
pH: 5 (ref 5.0–8.0)

## 2022-03-20 LAB — TROPONIN I (HIGH SENSITIVITY)
Troponin I (High Sensitivity): 4 ng/L (ref <18)
Troponin I (High Sensitivity): 16 ng/L (ref <18)
Troponin I (High Sensitivity): 25 ng/L — ABNORMAL HIGH (ref <18)

## 2022-03-20 LAB — PROTIME-INR
INR: 1 (ref 0.8–1.2)
Prothrombin Time: 12.6 seconds (ref 11.4–15.2)

## 2022-03-20 LAB — RAPID URINE DRUG SCREEN, HOSP PERFORMED
Amphetamines: NOT DETECTED
Barbiturates: NOT DETECTED
Benzodiazepines: NOT DETECTED
Cocaine: NOT DETECTED
Opiates: NOT DETECTED
Tetrahydrocannabinol: NOT DETECTED

## 2022-03-20 LAB — RESP PANEL BY RT-PCR (FLU A&B, COVID) ARPGX2
Influenza A by PCR: NEGATIVE
Influenza B by PCR: NEGATIVE
SARS Coronavirus 2 by RT PCR: NEGATIVE

## 2022-03-20 LAB — CBG MONITORING, ED: Glucose-Capillary: 104 mg/dL — ABNORMAL HIGH (ref 70–99)

## 2022-03-20 LAB — MRSA NEXT GEN BY PCR, NASAL: MRSA by PCR Next Gen: NOT DETECTED

## 2022-03-20 LAB — APTT: aPTT: 31 seconds (ref 24–36)

## 2022-03-20 LAB — MAGNESIUM: Magnesium: 1.9 mg/dL (ref 1.7–2.4)

## 2022-03-20 MED ORDER — ASPIRIN 81 MG PO TBEC: 81.0000 mg | DELAYED_RELEASE_TABLET | Freq: Every day | ORAL | Status: DC

## 2022-03-20 MED ORDER — POTASSIUM CHLORIDE CRYS ER 20 MEQ PO TBCR
40.0000 meq | EXTENDED_RELEASE_TABLET | Freq: Once | ORAL | Status: AC
  Administered 2022-03-20: 40 meq via ORAL
  Filled 2022-03-20: qty 2

## 2022-03-20 MED ORDER — SODIUM CHLORIDE 0.9 % IV SOLN
1000.0000 mL | INTRAVENOUS | Status: DC
  Administered 2022-03-20 (×2): 1000 mL via INTRAVENOUS

## 2022-03-20 MED ORDER — SENNOSIDES-DOCUSATE SODIUM 8.6-50 MG PO TABS: 1.0000 | ORAL_TABLET | Freq: Every day | ORAL | Status: DC

## 2022-03-20 MED ORDER — VITAMIN C 250 MG PO TABS
250.0000 mg | ORAL_TABLET | Freq: Every day | ORAL | Status: DC
  Filled 2022-03-20: qty 1

## 2022-03-20 MED ORDER — SODIUM CHLORIDE 0.9 % IV BOLUS (SEPSIS)
1000.0000 mL | Freq: Once | INTRAVENOUS | Status: AC
  Administered 2022-03-20: 1000 mL via INTRAVENOUS

## 2022-03-20 MED ORDER — ACETAMINOPHEN 500 MG PO TABS
1000.0000 mg | ORAL_TABLET | Freq: Once | ORAL | Status: AC
  Administered 2022-03-20: 1000 mg via ORAL
  Filled 2022-03-20: qty 2

## 2022-03-20 MED ORDER — ACETAMINOPHEN 325 MG PO TABS
650.0000 mg | ORAL_TABLET | Freq: Four times a day (QID) | ORAL | Status: DC | PRN
  Administered 2022-03-20: 650 mg via ORAL
  Filled 2022-03-20: qty 2

## 2022-03-20 MED ORDER — MELATONIN 3 MG PO TABS: 6.0000 mg | ORAL_TABLET | Freq: Every evening | ORAL | Status: DC | PRN

## 2022-03-20 MED ORDER — ADULT MULTIVITAMIN W/MINERALS CH: 1.0000 | ORAL_TABLET | Freq: Every day | ORAL | Status: DC

## 2022-03-20 MED ORDER — NITROFURANTOIN MONOHYD MACRO 100 MG PO CAPS
100.0000 mg | ORAL_CAPSULE | Freq: Two times a day (BID) | ORAL | Status: DC
  Filled 2022-03-20 (×2): qty 1

## 2022-03-20 MED ORDER — OMEGA-3-ACID ETHYL ESTERS 1 G PO CAPS: 1000.0000 mg | ORAL_CAPSULE | Freq: Every day | ORAL | Status: DC

## 2022-03-20 MED ORDER — DICLOFENAC SODIUM 1 % EX GEL: 2.0000 g | Freq: Four times a day (QID) | CUTANEOUS | Status: DC | PRN

## 2022-03-20 MED ORDER — ROSUVASTATIN CALCIUM 5 MG PO TABS
5.0000 mg | ORAL_TABLET | Freq: Every day | ORAL | Status: DC
  Administered 2022-03-20: 5 mg via ORAL
  Filled 2022-03-20: qty 1

## 2022-03-20 NOTE — ED Notes (Signed)
Checked CBG 104, RN Lisa informed

## 2022-03-20 NOTE — ED Notes (Signed)
Pt laid flat for orthostatic vitals 3:35

## 2022-03-20 NOTE — ED Triage Notes (Signed)
Pt was at work tonight and states she started feeling funny  Pt states she had some dizziness and felt off  Pt states she felt like her heart was racing and her left arm felt funny and both her legs felt weak

## 2022-03-20 NOTE — H&P (Addendum)
ELECTROPHYSIOLOGY CONSULT NOTE    Patient ID: Kari [name] MRN: [id number], DOB/AGE: Nov 26, 1950 71 y.o.  Admit date: 03/20/2022 Date of Consult: 03/20/2022  Primary Physician: Shelda Pal, DO Primary Cardiologist: Cristopher Peru, MD  Electrophysiologist: Dr. Lovena Le  Reason for admission: Pacemaker malfunction  Patient Profile: Kari [name] is a 71 y.o. female with a history of Heart block s/p DDD PPM 2014 who is being seen today for the evaluation of pacemaker malfunction at the request of Dr. Oval Linsey who received report of the patient from [facility].  HPI:  Kari [name] is a 71 y.o. female with medical history as above. She has known history of noise on both leads. At last office visit in 2020 it was reproducible on the atrial lead but not the ventricular lead. Sensitivities had previously been adjusted in 2018 to cover this from sensing.    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  Around 0130 she was sitting and eating when she felt a wave of lightheadedness and clamminess come over her. No overt chest pain but "didn't feel right" in her heart. She felt some heaviness in her legs and left arm numbness as well.   No fevers, chills, nausea, vomiting, or diarrhea.     Pacer interrogation revealed inhibition due to ventricular lead noise leading to HRs in the 30-40s. Pt was programmed from DDD 50 to DOO 70 with relief of symptoms.   Pertinent labs on admission include Potassium3.2* (07/27 0242)  - Supp given. Magnesium  1.9 (07/27 0242) Creatinine, ser  0.77 (07/27 0242) PLT  244 (07/27 0242) HGB  13.4 (07/27 0242)  WBC 7.2  Discussed with Dr. Lovena Le who follows the patient who recommends she be scheduled for Pacemaker Extraction and new Pacemaker implantation.   Pt is currently asymptomatic at rest.  She feels "off" with programming of PPM to 70. Explained that this is due to  lack of HR variability with asynchronous pacing.   Past Medical History:  Diagnosis Date   Arthritis    Chronic back pain    Diverticulitis    DVT (deep venous thrombosis) [facility]) age 53   s/p  knee arthroscopy    Herpes zoster conjunctivitis    Menopause    Mobitz type 2 second degree AV block    2:1/notes 02/16/2013   PONV (postoperative nausea and vomiting)    Presence of permanent cardiac pacemaker    Seasonal allergies    "spring & fall; not q year" (02/17/2013)   Shoulder impingement    and left rotator cuff tear     Surgical History:  Past Surgical History:  Procedure Laterality Date   ACHILLES TENDON SURGERY     ARTHROGRAM KNEE Right 1975   CHOLECYSTECTOMY  1990's   colonscopy     ESOPHAGOGASTRODUODENOSCOPY     EYE SURGERY     muscle release right eye   INSERT / REPLACE / REMOVE PACEMAKER  02/17/2013   [employer] Advantio dual-chamber pacemaker, model [id number],   KNEE ARTHROSCOPY Right 1982, 1983, 1995, 1996   KNEE ARTHROSCOPY W/ ACL RECONSTRUCTION Left 1984   LAPAROSCOPIC SIGMOID COLECTOMY N/A 01/01/2015   Procedure: LAPAROSCOPIC SIGMOID COLECTOMY;  Surgeon: Stark Klein, MD;  Location: [facility];  Service: General;  Laterality: N/A;   PERMANENT PACEMAKER INSERTION N/A 02/17/2013   Procedure: PERMANENT PACEMAKER INSERTION;  Surgeon: Evans Lance, MD;  Location: [facility] CATH LAB;  Service: Cardiovascular;  Laterality: N/A;  SHOULDER ARTHROSCOPY Left 09/05/2016   Procedure: LEFT SHOULDER ARTHROSCOPY , ACROMIOPLASTY AND ROTATOR CUFF REPAIR;  Surgeon: Melrose Nakayama, MD;  Location: [facility];  Service: Orthopedics;  Laterality: Left;   TONSILLECTOMY  1961   TOTAL KNEE ARTHROPLASTY Right 2000; 06/2000   "replaced w/appropriate hardware" (02/17/2013)   TOTAL KNEE REVISION Right 04/13/2019   Procedure: Revision Right Knee Arthroplasty;  Surgeon: Frederik Pear, MD;  Location: [facility];  Service: Orthopedics;  Laterality: Right;   TRIGGER FINGER RELEASE Right ~ 2012   "thumb"  (02/17/2013)   VAGINAL HYSTERECTOMY  ~ 1994   partial      Medications Prior to Admission  Medication Sig Dispense Refill Last Dose   Ascorbic Acid (VITAMIN C) 100 MG tablet Take 200 mg by mouth daily.      aspirin EC 81 MG tablet Take 81 mg by mouth daily.      Calcium Carb-Cholecalciferol (CALCIUM 600+D3 PO) Take 1 tablet by mouth daily.      COVID-19 mRNA bivalent vaccine, Pfizer, (PFIZER COVID-19 VAC BIVALENT) injection Inject into the muscle. 0.3 mL 0    COVID-19 mRNA Vac-TriS, Pfizer, SUSP injection Inject into the muscle. 0.3 mL 0    Cyanocobalamin (VITAMIN B-12) 2500 MCG SUBL Place 2,500 mcg under the tongue daily.       diclofenac sodium (VOLTAREN) 1 % GEL Apply 1-2 g topically 4 (four) times daily as needed (knee pain.).      Melatonin 3 MG TABS Take 6 mg by mouth at bedtime as needed (sleep.).      Multiple Vitamin (MULTIVITAMIN WITH MINERALS) TABS Take 1 tablet by mouth daily.      nitrofurantoin, macrocrystal-monohydrate, (MACROBID) 100 MG capsule Take 1 capsule (100 mg total) by mouth 2 (two) times daily for 10 days. 20 capsule 0    Omega-3 Fatty Acids (FISH OIL ULTRA) 1400 MG CAPS Take 1,400 mg by mouth daily.      phenazopyridine (PYRIDIUM) 100 MG tablet Take 1 tablet (100 mg total) by mouth 3 (three) times daily as needed for pain. 10 tablet 0    Probiotic Product (PROBIOTIC FORMULA PO) Take 1 capsule by mouth daily.      rosuvastatin (CRESTOR) 5 MG tablet Take 1 tablet (5 mg total) by mouth daily. 30 tablet 3    senna-docusate (SENOKOT-S) 8.6-50 MG tablet Take 1 tablet by mouth daily. 100 tablet 0    valACYclovir (VALTREX) 1000 MG tablet Take 1 tablet (1,000 mg total) by mouth daily as needed. 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[employer] [place]  Tobacco Use   Smoking status: Never   Smokeless tobacco: Never  Vaping Use   Vaping Use: Never used  Substance and Sexual Activity   Alcohol use: Yes    Comment: occasional wine    Drug use: No   Sexual activity: Yes    Birth control/protection: Surgical  Comment: 02/17/2013 "I have a same sex partner"  Other Topics Concern   Not on file  Social History Narrative   Not on file   Social Determinants of Health   Financial Resource Strain: Not on file  Food Insecurity: Not on file  Transportation Needs: Not on file  Physical Activity: Not on file  Stress: Not on file  Social Connections: Not on file  Intimate Partner Violence: Not on file     Family History  Problem Relation Age of Onset   Hypertension Mother    Hyperlipidemia Mother    Macular degeneration Mother    COPD Mother    Lung cancer Father    Cancer Father        Lung   COPD Father    Diabetes Brother    Breast cancer Paternal Aunt      Review of Systems: All other systems reviewed and are otherwise negative except as noted above.  Physical Exam: Vitals:   03/20/22 0920 03/20/22 0943 03/20/22 1050 03/20/22 1051  BP:  (!) 164/86  (!) 153/78  Pulse: 69     Resp: (!) 21 15    Temp:  97.9 F (36.6 C) 97.6 F (36.4 C)   TempSrc:   Oral   SpO2: 99% 99%    Weight:      Height:        GEN- The patient is well appearing, alert and oriented x 3 today.   HEENT: normocephalic, atraumatic; sclera clear, conjunctiva pink; hearing intact; oropharynx clear; neck supple Lungs- Clear to ausculation bilaterally, normal work of breathing.  No wheezes, rales, rhonchi Heart- Regular rate and rhythm, no  murmurs, rubs or gallops GI- soft, non-tender, non-distended, bowel sounds present Extremities- no clubbing, cyanosis, or edema; DP/PT/radial pulses 2+ bilaterally MS- no significant deformity or atrophy Skin- warm and dry, no rash or lesion Psych- euthymic mood, full affect Neuro- strength and sensation are intact  Labs:   Lab Results  Component Value Date   WBC 7.2 03/20/2022   HGB 13.4 03/20/2022   HCT 40.2 03/20/2022   MCV 93.1 03/20/2022   PLT 244 03/20/2022    Recent Labs  Lab 03/20/22 0242  NA 138  K 3.2*  CL 103  CO2 25  BUN 14  CREATININE 0.77  CALCIUM 9.3  PROT 7.7  BILITOT 1.1  ALKPHOS 74  ALT 20  AST 25  GLUCOSE 135*      Radiology/Studies: CT HEAD CODE STROKE WO CONTRAST  Result Date: 03/20/2022 CLINICAL DATA:  Code stroke. Initial evaluation for acute stroke, left arm weakness. EXAM: CT HEAD WITHOUT CONTRAST TECHNIQUE: Contiguous axial images were obtained from the base of the skull through the vertex without intravenous contrast. RADIATION DOSE REDUCTION: This exam was performed according to the departmental dose-optimization program which includes automated exposure control, adjustment of the mA and/or kV according to patient size and/or use of iterative reconstruction technique. COMPARISON:  Prior study from 01/30/2018. FINDINGS: Brain: Cerebral volume within normal limits for patient age. No evidence for acute intracranial hemorrhage. No findings to suggest acute large vessel territory infarct. No mass lesion, midline shift, or mass effect. Ventricles are normal in size without evidence for hydrocephalus. No extra-axial fluid collection identified. Vascular: No hyperdense vessel identified. Skull: Scalp soft tissues demonstrate no acute abnormality. Calvarium intact. Sinuses/Orbits: Globes and orbital soft tissues within normal limits. Visualized paranasal sinuses are clear. No mastoid effusion. ASPECTS [facility] Stroke Program Early CT Score) - Ganglionic  level infarction (caudate, lentiform nuclei,  internal capsule, insula, M1-M3 cortex): 7 - Supraganglionic infarction (M4-M6 cortex): 3 Total score (0-10 with 10 being normal): 10 IMPRESSION: 1. Normal head CT.  No acute intracranial abnormality. 2. ASPECTS is 10. Critical Value/emergent results were called by telephone at the time of interpretation on 03/20/2022 at 2:56 am to provider [facility] , who verbally acknowledged these results. Electronically Signed   By: Jeannine Boga M.D.   On: 03/20/2022 02:57   XR HIP UNILAT W OR W/O PELVIS 1V LEFT  Result Date: 03/17/2022 An AP pelvis and lateral left hip shows no acute gross findings.  There is just slight signs of femoral acetabular impingement with a superior lateral bone spur off the femoral head which is present on the right hip.  There is also a small osteophyte just superior and posterior to the acetabulum.  The joint space itself is congruent.  DG Cervical Spine Complete  Result Date: 03/11/2022 CLINICAL DATA:  Pain EXAM: CERVICAL SPINE - COMPLETE 4 VIEW COMPARISON:  Cervical spine radiograph dated March 02, 2018 FINDINGS: Vertebral body heights are well-maintained. Moderate multilevel degenerative disc disease, most pronounced at C5-C6 and C6-C7 unchanged when compared to prior. Minimal grade 1 anterolisthesis of C7 on T1, unchanged when compared to prior exam and likely degenerative. Moderate to severe multilevel facet arthropathy, unchanged when compared with prior exam. Dens is intact on open-mouth view. Soft tissues are unremarkable. IMPRESSION: 1. No acute osseous abnormality. 2. Degenerative changes of the cervical spine, similar prior exam. Electronically Signed   By: Yetta Glassman M.D.   On: 03/11/2022 15:33    EKG:on arrival shows AS/VP at 103 bpm (personally reviewed)  TELEMETRY: V paced at 70 (personally reviewed)  DEVICE HISTORY: DDD PPM 2014. [employer] Generator, Two MDT [phone] leads  Assessment/Plan: 1.  Pacemaker  Malfunction 2. Complete Heart Block Pt is dependent on device on check today.  RV noise inhibits pacing despite lowering sensitivity to 8.0 mV She is programmed DOO 70 and Kari [name] remain so until Extraction, tentatively planned for Monday  Plan for observation overnight, then home to return as outpatient on Monday if device and symptoms remains stable.   Explained risks, benefits, and alternatives to PPM extraction and revision. Kari [name] discuss again and more in depth as we get closer to the procedure. She understands significant risk and need for cardiothoracic surgery back up. Pt verbalized understanding and agrees to proceed at next available time. (Tentatively Monday)  For questions or updates, please contact [place] Please consult [email or web address] for contact info under Cardiology/STEMI.  Signed, Shirley Friar, PA-C  03/20/2022 11:28 AM   I have seen and examined this patient with Oda Kilts.  Agree with above, note added to reflect my findings.  She is a 71 year old lady with a history of heart block post pacemaker.  She has noise on both of her pacemaker leads.  There is been previous discussions about her noise and replacing or extracting the leads at the time of generator change.  She was in her usual state of health working this morning.  She had an episode of lightheadedness and clamminess.  There is no overt pain.  She had heaviness in her legs.  Pacemaker interrogation showed inhibition of the ventricular lead due to noise with heart rates in the 30s to 40s.  She is being reprogrammed to DOO 50 with complete relief of her symptoms.  She is laying in bed feeling well without complaint.  GEN: Well nourished, well developed, in no acute distress  HEENT:  normal  Neck: no JVD, carotid bruits, or masses Cardiac: RRR; no murmurs, rubs, or gallops,no edema  Respiratory:  clear to auscultation bilaterally, normal work of breathing GI: soft, nontender, nondistended, + BS MS: no  deformity or atrophy  Skin: warm and dry, device site well healed Neuro:  Strength and sensation are intact Psych: euthymic mood, full affect   Pacemaker lead malfunction: At this point, she Random Dobrowski need lead revision.  She spoke with Dr. Lovena Le multiple times, and we Nissan Frazzini thus plan for lead extraction and reimplantation.  We Adrean Findlay monitor her overnight tonight to ensure she Taim Wurm not have symptoms from the changes made to her pacemaker.  Lead extraction planned for Monday.  Mirza Fessel M. Alois Colgan MD 03/20/2022 3:23 PM

## 2022-03-20 NOTE — Discharge Summary (Addendum)
ELECTROPHYSIOLOGY DISCHARGE SUMMARY    Patient ID: Kari [name],  MRN: [id number], DOB/AGE: 1950/09/20 71 y.o.  Admit date: 03/20/2022 Discharge date: 03/21/2022  Primary Care Physician: Shelda Pal, DO  Primary Cardiologist: Cristopher Peru, MD  Electrophysiologist: Dr. Lovena Le  Primary Discharge Diagnosis:  Pacemaker malfunction  Secondary Discharge Diagnosis:  Complete Heart Block   Brief HPI: Kari [name] is a 71 y.o. 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  Around 0130 she was sitting and eating when she felt a wave of lightheadedness and clamminess come over her. No overt chest pain but "didn't feel right" in her heart. She felt some heaviness in her legs and left arm numbness as well and presented to [facility].   Pacer interrogation revealed inhibition due to ventricular lead noise leading to HRs in the 30-40s. Pt was programmed from DDD 50 to DOO 70 with relief of symptoms.     Discussed with Dr. Lovena Le who follows the patient who recommends she be scheduled for Pacemaker Extraction and new Pacemaker implantation.   Pt had in depth device interrogation evening of 7/27 which showed noise of > 10.0 mV at times, with no option to reprogram sensing to ignore.  Pt was left at DOO 70 with extra safety margin.   The patient Kari [name] return on Monday for extraction and PPM re-implant by Dr. Lovena Le with usual post op care.   Physical Exam: Vitals:   03/20/22 1932 03/20/22 2329 03/21/22 0000 03/21/22 0400  BP: 136/67 (!) 109/58  129/74  Pulse:      Resp: 19   14  Temp: 98.2 F (36.8 C) 97.9 F (36.6 C)    TempSrc: Oral Oral    SpO2: 100% 100% 99% 99%  Weight:      Height:        GEN- The patient is well appearing, alert and oriented x 3 today.   HEENT: normocephalic,  atraumatic; sclera clear, conjunctiva pink; hearing intact; oropharynx clear; neck supple  Lungs- Clear to ausculation bilaterally, normal work of breathing.  No wheezes, rales, rhonchi Heart- Regular rate and rhythm, no murmurs, rubs or gallops  GI- soft, non-tender, non-distended, bowel sounds present  Extremities- no clubbing, cyanosis, or edema; DP/PT/radial pulses 2+ bilaterally, groin without hematoma/bruit MS- no significant deformity or atrophy Skin- warm and dry, no rash or lesion Psych- euthymic mood, full affect Neuro- strength and sensation are intact   Labs:   Lab Results  Component Value Date   WBC 7.2 03/20/2022   HGB 13.4 03/20/2022   HCT 40.2 03/20/2022   MCV 93.1 03/20/2022   PLT 244 03/20/2022    Recent Labs  Lab 03/20/22 0242  NA 138  K 3.2*  CL 103  CO2 25  BUN 14  CREATININE 0.77  CALCIUM 9.3  PROT 7.7  BILITOT 1.1  ALKPHOS 74  ALT 20  AST 25  GLUCOSE 135*     Discharge Medications:  Allergies as of 03/21/2022       Reactions   Ceftin Itching   Tolerates amoxicillin   Vancomycin Hives   Bactrim [sulfamethoxazole-trimethoprim] Hives   Levofloxacin Other (See Comments)   "spacey," tolerates Cipro        Medication List     STOP taking these medications    Pfizer COVID-19 Vac Bivalent injection  Generic drug: COVID-19 mRNA bivalent vaccine Therapist, music)   Pfizer-BioNT COVID-19 Vac-TriS Susp injection Generic drug: COVID-19 mRNA Vac-TriS (Pfizer)       TAKE these medications    aspirin EC 81 MG tablet Take 81 mg by mouth daily.   CALCIUM 600+D3 PO Take 1 tablet by mouth daily.   diclofenac sodium 1 % Gel Commonly known as: VOLTAREN Apply 1-2 g topically 4 (four) times daily as needed (knee pain.).   Fish Oil Ultra 1400 MG Caps Take 1,400 mg by mouth daily.   melatonin 3 MG Tabs tablet Take 6 mg by mouth at bedtime as needed (sleep.).   multivitamin with minerals Tabs tablet Take 1 tablet by mouth daily.    nitrofurantoin (macrocrystal-monohydrate) 100 MG capsule Commonly known as: Macrobid Take 1 capsule (100 mg total) by mouth 2 (two) times daily for 10 days.   phenazopyridine 100 MG tablet Commonly known as: Pyridium Take 1 tablet (100 mg total) by mouth 3 (three) times daily as needed for pain.   PROBIOTIC FORMULA PO Take 1 capsule by mouth daily.   rosuvastatin 5 MG tablet Commonly known as: Crestor Take 1 tablet (5 mg total) by mouth daily.   Senna Plus 8.6-50 MG tablet Generic drug: senna-docusate Take 1 tablet by mouth daily.   valACYclovir 1000 MG tablet Commonly known as: VALTREX Take 1 tablet (1,000 mg total) by mouth daily as needed.   Vitamin B-12 2500 MCG Subl Place 2,500 mcg under the tongue daily.   vitamin C 100 MG tablet Take 200 mg by mouth daily.        Disposition:    Follow-up Information     [facility].   Why: Return on Monday, 7/31 at 1230 pm for procedure at 230 pm. Contact information: [place] [address] [phone]                Duration of Discharge Encounter: Greater than 30 minutes including physician time.  Signed, Kari Friar, PA-C  03/21/2022 7:36 AM   I have seen and examined this patient with Kari [name].  Agree with above, note added to reflect my findings.  Patient mid to the hospital after weakness and fatigue.  Was found to have significant noise on her pacemaker leads with over sensing and pacemaker lead dysfunction.  Programming changes occurred with forced pacing.  Feeling well today.  Checked by [employer] rep without major abnormality and has been stable overnight with current changes.  We Kari [name] plan for lead extraction on Monday with reimplantation.  GEN: Well nourished, well developed, in no acute distress  HEENT: normal  Neck: no JVD, carotid bruits, or masses Cardiac: RRR; no murmurs, rubs, or gallops,no edema   Respiratory:  clear to auscultation bilaterally, normal work of breathing GI: soft, nontender, nondistended, + BS MS: no deformity or atrophy  Skin: warm and dry, device site well healed Neuro:  Strength and sensation are intact Psych: euthymic mood, full affect    Shameer [name] M. Keithan Dileonardo MD 03/21/2022 7:40 AM

## 2022-03-20 NOTE — Progress Notes (Signed)
Mobility Specialist Progress Note    03/20/22 1630  Mobility  Activity Ambulated independently in hallway  Level of Assistance Independent  Assistive Device None  Distance Ambulated (ft) 420 ft  Activity Response Tolerated well  $Mobility charge 1 Mobility   Pt received in bed and agreeable. No complaints on walk. Returned to bed with call bell in reach.    Hildred Alamin Mobility Specialist

## 2022-03-20 NOTE — ED Notes (Signed)
Called [employer] scientific Teacher, English as a foreign language, spoke with Microbiologist rep. She will page local rep and they will contact ED asap

## 2022-03-20 NOTE — ED Notes (Signed)
ED TO INPATIENT HANDOFF REPORT  ED Nurse Name and Phone #: Lurene Shadow, RN  [phone]  S Name/Age/Gender Kari [name] 71 y.o. female Room/Bed: MH09/MH09  Code Status   Code Status: Prior  Home/SNF/Other Home Patient oriented to: self, place, time, and situation Is this baseline? Yes   Triage Complete: Triage complete  Chief Complaint Near syncope [R55]  Triage Note Pt was at work tonight and states she started feeling funny  Pt states she had some dizziness and felt off  Pt states she felt like her heart was racing and her left arm felt funny and both her legs felt weak     Allergies Allergies  Allergen Reactions   Ceftin Itching    Tolerates amoxicillin   Vancomycin Hives   Bactrim [Sulfamethoxazole-Trimethoprim] Hives   Levofloxacin Other (See Comments)    "spacey," tolerates Cipro    Level of Care/Admitting Diagnosis ED Disposition     ED Disposition  Admit   Condition  --   [place]: [place] [100100]  Level of Care: Telemetry Cardiac [103]  May admit patient to [facility] or [facility] if equivalent level of care is available:: No  Interfacility transfer: Yes  Covid Evaluation: Asymptomatic - no recent exposure (last 10 days) testing not required  Diagnosis: Near syncope [692301]  Admitting Physician: Skeet Latch [[id number]]  Attending Physician: Skeet Latch [[id number]]  Certification:: I certify this patient will need inpatient services for at least 2 midnights  Estimated Length of Stay: 2          B Medical/Surgery History Past Medical History:  Diagnosis Date   Arthritis    Chronic back pain    Diverticulitis    DVT (deep venous thrombosis) [facility]) age 67   s/p  knee arthroscopy    Herpes zoster conjunctivitis    Menopause    Mobitz type 2 second degree AV block    2:1/notes 02/16/2013   PONV (postoperative nausea and vomiting)    Presence of permanent cardiac pacemaker    Seasonal  allergies    "spring & fall; not q year" (02/17/2013)   Shoulder impingement    and left rotator cuff tear   Past Surgical History:  Procedure Laterality Date   ACHILLES TENDON SURGERY     ARTHROGRAM KNEE Right 1975   CHOLECYSTECTOMY  1990's   colonscopy     ESOPHAGOGASTRODUODENOSCOPY     EYE SURGERY     muscle release right eye   INSERT / REPLACE / REMOVE PACEMAKER  02/17/2013   [employer] Advantio dual-chamber pacemaker, model [id number],   KNEE ARTHROSCOPY Right 1982, 1983, [place] ARTHROSCOPY W/ ACL RECONSTRUCTION Left 1984   LAPAROSCOPIC SIGMOID COLECTOMY N/A 01/01/2015   Procedure: LAPAROSCOPIC SIGMOID COLECTOMY;  Surgeon: Stark Klein, MD;  Location: [place];  Service: General;  Laterality: N/A;   PERMANENT PACEMAKER INSERTION N/A 02/17/2013   Procedure: PERMANENT PACEMAKER INSERTION;  Surgeon: Evans Lance, MD;  [facility];  Service: Cardiovascular;  Laterality: N/A;   SHOULDER ARTHROSCOPY Left 09/05/2016   Procedure: LEFT SHOULDER ARTHROSCOPY , ACROMIOPLASTY AND ROTATOR CUFF REPAIR;  Surgeon: Melrose Nakayama, MD;  Location: [place];  Service: Orthopedics;  Laterality: Left;   TONSILLECTOMY  1961   TOTAL KNEE ARTHROPLASTY Right 2000; 06/2000   "replaced w/appropriate hardware" (02/17/2013)   TOTAL KNEE REVISION Right 04/13/2019   Procedure: Revision Right Knee Arthroplasty;  Surgeon: Frederik Pear, MD;  Location: [facility];  Service: Orthopedics;  Laterality: Right;   TRIGGER FINGER RELEASE Right ~ 2012   "thumb" (02/17/2013)   VAGINAL HYSTERECTOMY  ~ 1994   partial      A IV Location/Drains/Wounds Patient Lines/Drains/Airways Status     Active Line/Drains/Airways     Name Placement date Placement time Site Days   Peripheral IV 03/20/22 20 G 1" Right Antecubital 03/20/22  0243  Antecubital  less than 1            Intake/Output Last 24 hours No intake or output data in the 24 hours ending 03/20/22 0931  Labs/Imaging Results for orders placed or  performed during the hospital encounter of 03/20/22 (from the past 48 hour(s))  CBG monitoring, ED     Status: Abnormal   Collection Time: 03/20/22  2:10 AM  Result Value Ref Range   Glucose-Capillary 104 (H) 70 - 99 mg/dL    Comment: Glucose reference range applies only to samples taken after fasting for at least 8 hours.  Protime-INR     Status: None   Collection Time: 03/20/22  2:42 AM  Result Value Ref Range   Prothrombin Time 12.6 11.4 - 15.2 seconds   INR 1.0 0.8 - 1.2    Comment: (NOTE) INR goal varies based on device and disease states. Performed at [facility], [address] [place], [place] [address]   APTT     Status: None   Collection Time: 03/20/22  2:42 AM  Result Value Ref Range   aPTT 31 24 - 36 seconds    Comment: Performed at [facility], [address] [place], [place] [address]  CBC     Status: None   Collection Time: 03/20/22  2:42 AM  Result Value Ref Range   WBC 7.2 4.0 - 10.5 K/uL   RBC 4.32 3.87 - 5.11 MIL/uL   Hemoglobin 13.4 12.0 - 15.0 g/dL   HCT 40.2 36.0 - 46.0 %   MCV 93.1 80.0 - 100.0 fL   MCH 31.0 26.0 - 34.0 pg   MCHC 33.3 30.0 - 36.0 g/dL   RDW 13.7 11.5 - 15.5 %   Platelets 244 150 - 400 K/uL   nRBC 0.0 0.0 - 0.2 %    Comment: Performed at [facility], [address] [place], [place] [address]  Differential     Status: None   Collection Time: 03/20/22  2:42 AM  Result Value Ref Range   Neutrophils Relative % 40 %   Neutro Abs 2.9 1.7 - 7.7 K/uL   Lymphocytes Relative 50 %   Lymphs Abs 3.5 0.7 - 4.0 K/uL   Monocytes Relative 9 %   Monocytes Absolute 0.6 0.1 - 1.0 K/uL   Eosinophils Relative 1 %   Eosinophils Absolute 0.1 0.0 - 0.5 K/uL   Basophils Relative 0 %   Basophils Absolute 0.0 0.0 - 0.1 K/uL   Immature Granulocytes 0 %   Abs Immature Granulocytes 0.01 0.00 - 0.07 K/uL    Comment: Performed at [facility], [address] [place], [place] [address]  Comprehensive metabolic  panel     Status: Abnormal   Collection Time: 03/20/22  2:42 AM  Result Value Ref Range   Sodium 138 135 - 145 mmol/L   Potassium 3.2 (L) 3.5 - 5.1 mmol/L   Chloride 103 98 - 111 mmol/L   CO2 25 22 - 32 mmol/L   Glucose, Bld 135 (H) 70 - 99 mg/dL  Comment: Glucose reference range applies only to samples taken after fasting for at least 8 hours.   BUN 14 8 - 23 mg/dL   Creatinine, Ser 0.77 0.44 - 1.00 mg/dL   Calcium 9.3 8.9 - 10.3 mg/dL   Total Protein 7.7 6.5 - 8.1 g/dL   Albumin 4.2 3.5 - 5.0 g/dL   AST 25 15 - 41 U/L   ALT 20 0 - 44 U/L   Alkaline Phosphatase 74 38 - 126 U/L   Total Bilirubin 1.1 0.3 - 1.2 mg/dL   GFR, Estimated >60 >60 mL/min    Comment: (NOTE) Calculated using the CKD-EPI Creatinine Equation (2021)    Anion gap 10 5 - 15    Comment: Performed at [facility], [address] [place], [place] [address]  Troponin I (High Sensitivity)     Status: None   Collection Time: 03/20/22  2:42 AM  Result Value Ref Range   Troponin I (High Sensitivity) 4 <18 ng/L    Comment: (NOTE) Elevated high sensitivity troponin I (hsTnI) values and significant  changes across serial measurements may suggest ACS but many other  chronic and acute conditions are known to elevate hsTnI results.  Refer to the "Links" section for chest pain algorithms and additional  guidance. Performed at [facility], [address] [place], [place] [address]   Magnesium     Status: None   Collection Time: 03/20/22  2:42 AM  Result Value Ref Range   Magnesium 1.9 1.7 - 2.4 mg/dL    Comment: Performed at [facility], [address] [place], [place] [address]  Resp Panel by RT-PCR (Flu A&B, Covid) Anterior Nasal Swab     Status: None   Collection Time: 03/20/22  3:16 AM   Specimen: Anterior Nasal Swab  Result Value Ref Range   SARS Coronavirus 2 by RT PCR NEGATIVE NEGATIVE    Comment: (NOTE) SARS-CoV-2 target nucleic acids are NOT DETECTED.  The  SARS-CoV-2 RNA is generally detectable in upper respiratory specimens during the acute phase of infection. The lowest concentration of SARS-CoV-2 viral copies this assay can detect is 138 copies/mL. A negative result does not preclude SARS-Cov-2 infection and should not be used as the sole basis for treatment or other patient management decisions. A negative result may occur with  improper specimen collection/handling, submission of specimen other than nasopharyngeal swab, presence of viral mutation(s) within the areas targeted by this assay, and inadequate number of viral copies(<138 copies/mL). A negative result must be combined with clinical observations, patient history, and epidemiological information. The expected result is Negative.  Fact Sheet for Patients:  EntrepreneurPulse.com.au  Fact Sheet for Healthcare Providers:  IncredibleEmployment.be  This test is no t yet approved or cleared by the [place] FDA and  has been authorized for detection and/or diagnosis of SARS-CoV-2 by FDA under an Emergency Use Authorization (EUA). This EUA will remain  in effect (meaning this test can be used) for the duration of the COVID-19 declaration under Section 564(b)(1) of the Act, 21 U.S.C.section 360bbb-3(b)(1), unless the authorization is terminated  or revoked sooner.       Influenza A by PCR NEGATIVE NEGATIVE   Influenza B by PCR NEGATIVE NEGATIVE    Comment: (NOTE) The Xpert Xpress SARS-CoV-2/FLU/RSV plus assay is intended as an aid in the diagnosis of influenza from Nasopharyngeal swab specimens and should not be used as a sole basis for treatment. Nasal washings and aspirates are unacceptable  for Xpert Xpress SARS-CoV-2/FLU/RSV testing.  Fact Sheet for Patients: EntrepreneurPulse.com.au  Fact Sheet for Healthcare Providers: IncredibleEmployment.be  This test is not yet approved or cleared by the  [place] FDA and has been authorized for detection and/or diagnosis of SARS-CoV-2 by FDA under an Emergency Use Authorization (EUA). This EUA will remain in effect (meaning this test can be used) for the duration of the COVID-19 declaration under Section 564(b)(1) of the Act, 21 U.S.C. section 360bbb-3(b)(1), unless the authorization is terminated or revoked.  Performed at [facility], [address] [place], [place] [address]   Urine rapid drug screen (hosp performed)     Status: None   Collection Time: 03/20/22  5:14 AM  Result Value Ref Range   Opiates NONE DETECTED NONE DETECTED   Cocaine NONE DETECTED NONE DETECTED   Benzodiazepines NONE DETECTED NONE DETECTED   Amphetamines NONE DETECTED NONE DETECTED   Tetrahydrocannabinol NONE DETECTED NONE DETECTED   Barbiturates NONE DETECTED NONE DETECTED    Comment: (NOTE) DRUG SCREEN FOR MEDICAL PURPOSES ONLY.  IF CONFIRMATION IS NEEDED FOR ANY PURPOSE, NOTIFY LAB WITHIN 5 DAYS.  LOWEST DETECTABLE LIMITS FOR URINE DRUG SCREEN Drug Class                     Cutoff (ng/mL) Amphetamine and metabolites    1000 Barbiturate and metabolites    200 Benzodiazepine                 932 Tricyclics and metabolites     300 Opiates and metabolites        300 Cocaine and metabolites        300 THC                            50 Performed at [facility], [address] [place], [place] [address]   Urinalysis, Routine w reflex microscopic Urine, Clean Catch     Status: None   Collection Time: 03/20/22  5:15 AM  Result Value Ref Range   Color, Urine YELLOW YELLOW   APPearance CLEAR CLEAR   Specific Gravity, Urine <=1.005 1.005 - 1.030   pH 5.0 5.0 - 8.0   Glucose, UA NEGATIVE NEGATIVE mg/dL   Hgb urine dipstick NEGATIVE NEGATIVE   Bilirubin Urine NEGATIVE NEGATIVE   Ketones, ur NEGATIVE NEGATIVE mg/dL   Protein, ur NEGATIVE NEGATIVE mg/dL   Nitrite NEGATIVE NEGATIVE   Leukocytes,Ua NEGATIVE NEGATIVE     Comment: Microscopic not done on urines with negative protein, blood, leukocytes, nitrite, or glucose < 500 mg/dL. Performed at [facility], [address] [place], [place] [address]   Troponin I (High Sensitivity)     Status: None   Collection Time: 03/20/22  5:19 AM  Result Value Ref Range   Troponin I (High Sensitivity) 16 <18 ng/L    Comment: (NOTE) Elevated high sensitivity troponin I (hsTnI) values and significant  changes across serial measurements may suggest ACS but many other  chronic and acute conditions are known to elevate hsTnI results.  Refer to the "Links" section for chest pain algorithms and additional  guidance. Performed at [facility], [address] [place], [place] [address]   Troponin I (High Sensitivity)     Status: Abnormal   Collection Time: 03/20/22  8:26 AM  Result Value Ref Range   Troponin I (High Sensitivity) 25 (H) <18 ng/L  Comment: (NOTE) Elevated high sensitivity troponin I (hsTnI) values and significant  changes across serial measurements may suggest ACS but many other  chronic and acute conditions are known to elevate hsTnI results.  Refer to the "Links" section for chest pain algorithms and additional  guidance. Performed at [facility], [address] [place], [place] [address]    CT HEAD CODE STROKE WO CONTRAST  Result Date: 03/20/2022 CLINICAL DATA:  Code stroke. Initial evaluation for acute stroke, left arm weakness. EXAM: CT HEAD WITHOUT CONTRAST TECHNIQUE: Contiguous axial images were obtained from the base of the skull through the vertex without intravenous contrast. RADIATION DOSE REDUCTION: This exam was performed according to the departmental dose-optimization program which includes automated exposure control, adjustment of the mA and/or kV according to patient size and/or use of iterative reconstruction technique. COMPARISON:  Prior study from 01/30/2018. FINDINGS: Brain: Cerebral volume within  normal limits for patient age. No evidence for acute intracranial hemorrhage. No findings to suggest acute large vessel territory infarct. No mass lesion, midline shift, or mass effect. Ventricles are normal in size without evidence for hydrocephalus. No extra-axial fluid collection identified. Vascular: No hyperdense vessel identified. Skull: Scalp soft tissues demonstrate no acute abnormality. Calvarium intact. Sinuses/Orbits: Globes and orbital soft tissues within normal limits. Visualized paranasal sinuses are clear. No mastoid effusion. ASPECTS [facility] Stroke Program Early CT Score) - Ganglionic level infarction (caudate, lentiform nuclei, internal capsule, insula, M1-M3 cortex): 7 - Supraganglionic infarction (M4-M6 cortex): 3 Total score (0-10 with 10 being normal): 10 IMPRESSION: 1. Normal head CT.  No acute intracranial abnormality. 2. ASPECTS is 10. Critical Value/emergent results were called by telephone at the time of interpretation on 03/20/2022 at 2:56 am to provider [facility] , who verbally acknowledged these results. Electronically Signed   By: Jeannine Boga M.D.   On: 03/20/2022 02:57    Pending Labs Unresulted Labs (From admission, onward)    None       Vitals/Pain Today's Vitals   03/20/22 0915 03/20/22 0920 03/20/22 0925 03/20/22 0929  BP:      Pulse: 70 69    Resp: 16 (!) 21    Temp:      TempSrc:      SpO2: 99% 99%    Weight:      Height:      PainSc:   1  1     Isolation Precautions No active isolations  Medications Medications  sodium chloride 0.9 % bolus 1,000 mL (0 mLs Intravenous Stopped 03/20/22 0520)    Followed by  0.9 %  sodium chloride infusion (1,000 mLs Intravenous New Bag/Given 03/20/22 0521)  potassium chloride SA (KLOR-CON M) CR tablet 40 mEq (40 mEq Oral Given 03/20/22 0400)  acetaminophen (TYLENOL) tablet 1,000 mg (1,000 mg Oral Given 03/20/22 0825)    Mobility walks Low fall risk   Focused Assessments Cardiac Assessment  Handoff:    Lab Results  Component Value Date   TROPONINI <0.30 03/11/2013   Lab Results  Component Value Date   DDIMER 0.84 (H) 02/19/2013   Does the Patient currently have chest pain? No    R Recommendations: See Admitting Provider Note  Report given to: Tommy, RN on [facility]  Additional Notes:  Pacemaker interogated:  set to rate of 70, pt is 100% paced

## 2022-03-20 NOTE — ED Notes (Signed)
[employer] rep in room at this time

## 2022-03-20 NOTE — ED Notes (Signed)
Kari [name] gave report to nurse  on unit

## 2022-03-20 NOTE — Progress Notes (Signed)
Code stroke activated at [place]. Pt had already been to CT. TS paged at 0313. Dr Wolfgang Phoenix on screen at 0316-results of Head CT communicated. Gillis Santa, Telestroke RN

## 2022-03-20 NOTE — ED Notes (Signed)
Teleneuro consult in progress 

## 2022-03-20 NOTE — Consult Note (Signed)
[place] [facility] TeleNeurology Consult Services   Patient Name:   Loxley, Cibrian [place] Date of Birth:   07/18/1951 Identification Number:   MRN - [id number] Date of Service:   03/20/2022 03:13:11  Diagnosis:       R20.2 - Paresthesia of skin       R42 - Dizziness/ Vertigo/ Giddiness       I63.9 - Cerebrovascular accident (CVA), unspecified mechanism ([place])  Impression:      71 yo woman presenting for evaluation of dizziness, BLE heaviness, left arm tingling. NIH zero. Suspicion for stroke is rather low but possible. I wonder if she had a mild vasovagal event. Cannot get MRI due to pacemaker. For now, would keep under observation and continue aspirin. If she is feeling better, likely can be d/c later this morning.  Our recommendations are outlined below.  Recommendations:        Stroke/Telemetry Floor       Neuro Checks       Bedside Swallow Eval       DVT Prophylaxis       IV Fluids, Normal Saline       Head of Bed 30 Degrees       Euglycemia and Avoid Hyperthermia (PRN Acetaminophen)       Initiate or continue Aspirin 81 MG daily  Per facility request will defer further work up, management, and referrals to inpatient service, inclusive of inpatient neurology consult.  Sign Out:       Discussed with Emergency Department Provider    ------------------------------------------------------------------------------  Advanced Imaging: Advanced Imaging Deferred because:  Non-disabling symptoms as verified by the patient; no cortical signs so not consistent with LVO   Metrics: Last Known Well: 03/20/2022 01:45:00 [facility] Notification Time: 03/20/2022 03:13:11 Arrival Time: 03/20/2022 02:26:00 Stamp Time: 03/20/2022 03:13:11 Initial Response Time: 03/20/2022 03:15:42 Symptoms: dizziness, left numbness. Initial patient interaction: 03/20/2022 03:17:30 NIHSS Assessment Completed: 03/20/2022 03:28:10 Patient is not a candidate for  Thrombolytic. Thrombolytic Medical Decision: 03/20/2022 03:28:11 Patient was not deemed candidate for Thrombolytic because of following reasons: No disabling symptoms.  CT head showed no acute hemorrhage or acute core infarct.  Primary Provider Notified of Diagnostic Impression and Management Plan on: 03/20/2022 03:37:59    ------------------------------------------------------------------------------  History of Present Illness: Patient is a 71 year old Female.  Patient was brought by private transportation with symptoms of dizziness, left numbness. This is a 71 yo woman who presents for evaluation of diaphoresis, dizziness, BLE heaviness, and left arm tingling. Symptoms started around 145 am. 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  Denies any focal weakness. She's been able to walk. NIH is zero. Head CT neg. No recent new medical issues but she was started on Crestor a couple of weeks ago.     Past Medical History:      Hyperlipidemia      There is no history of Stroke  Medications:  No Anticoagulant use  Antiplatelet use: Yes asa Reviewed EMR for current medications  Allergies:  Reviewed  Social History: Smoking: No  Family History:  There is no family history of premature cerebrovascular disease pertinent to this consultation  ROS : 14 Points Review of Systems was performed and was negative except mentioned in HPI.  Past Surgical History: There Is No Surgical History Contributory To Today's Visit     Examination: BP(169/74), Pulse(87), Blood Glucose(135) 1A: Level of Consciousness - Alert; keenly responsive + 0 1B: Ask Month and Age - Both Questions Right + 0 1C:  Blink Eyes & Squeeze Hands - Performs Both Tasks + 0 2: Test Horizontal Extraocular Movements - Normal + 0 3: Test Visual Fields - No Visual Loss + 0 4: Test Facial Palsy (Use Grimace if Obtunded) - Normal symmetry + 0 5A: Test Left Arm Motor Drift - No Drift for 10 Seconds +  0 5B: Test Right Arm Motor Drift - No Drift for 10 Seconds + 0 6A: Test Left Leg Motor Drift - No Drift for 5 Seconds + 0 6B: Test Right Leg Motor Drift - No Drift for 5 Seconds + 0 7: Test Limb Ataxia (FNF/Heel-Shin) - No Ataxia + 0 8: Test Sensation - Normal; No sensory loss + 0 9: Test Language/Aphasia - Normal; No aphasia + 0 10: Test Dysarthria - Normal + 0 11: Test Extinction/Inattention - No abnormality + 0  NIHSS Score: 0   Pre-Morbid Modified Rankin Scale: 0 Points = No symptoms at all   Patient/Family was informed the Neurology Consult would occur via TeleHealth consult by way of interactive audio and video telecommunications and consented to receiving care in this manner.   Patient is being evaluated for possible acute neurologic impairment and high probability of imminent or life-threatening deterioration. I spent total of 25 minutes providing care to this patient, including time for face to face visit via telemedicine, review of medical records, imaging studies and discussion of findings with providers, the patient and/or family.   Dr Precious Gilding   [facility] For Inpatient follow-up with [facility] physician please call [facility] [phone]. This is not an outpatient service. Post hospital discharge, please contact hospital directly.

## 2022-03-20 NOTE — ED Notes (Signed)
Pt ambulated to restroom and back without difficulty  Steady gait  Denies dizziness

## 2022-03-20 NOTE — ED Provider Notes (Signed)
[facility] EMERGENCY DEPARTMENT Provider Note  CSN: [id number] Arrival date & time: 03/20/22 7989  Chief Complaint(s) Numbness  HPI [place] is a 71 y.o. female with a past medical history listed below including Mobitz 2 requiring pacemaker who was working here in the emergency department when she suddenly became diaphoretic with left arm numbness.  She also reported feeling like her legs were heavy.  She also reports feeling "not well."  She denied any associated chest pain or shortness of breath.  No nausea.  No abdominal pain.  No visual disturbance.  No focal weakness.  She denies any recent fevers or infections.  No coughing or congestion.  No recent vomiting or diarrhea.  Patient has never had these types of symptoms past before.  Reports that she has been hydrating well.   She was started on Crestor 1 week ago.  No other medication changes.  The history is provided by the patient.    Past Medical History Past Medical History:  Diagnosis Date   Arthritis    Chronic back pain    Diverticulitis    DVT (deep venous thrombosis) [facility]) age 89   s/p  knee arthroscopy    Herpes zoster conjunctivitis    Menopause    Mobitz type 2 second degree AV block    2:1/notes 02/16/2013   PONV (postoperative nausea and vomiting)    Presence of permanent cardiac pacemaker    Seasonal allergies    "spring & fall; not q year" (02/17/2013)   Shoulder impingement    and left rotator cuff tear   Patient Active Problem List   Diagnosis Date Noted   Aortic atherosclerosis ([place]) 03/07/2022   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 Authorizing Provider  Ascorbic Acid (VITAMIN C) 100 MG tablet Take 200 mg by mouth daily.    [provider]  aspirin EC 81 MG tablet Take 81 mg by mouth daily.    [provider]  Calcium Carb-Cholecalciferol (CALCIUM 600+D3 PO) Take 1 tablet by mouth daily.    [provider]  COVID-19 mRNA bivalent vaccine, Pfizer, (PFIZER COVID-19 VAC BIVALENT) injection Inject into the muscle. 06/20/21   Carlyle Basques, MD  COVID-19 mRNA Vac-TriS, Pfizer, SUSP injection Inject into the muscle. 03/04/21   Carlyle Basques, MD  Cyanocobalamin (VITAMIN B-12) 2500 MCG SUBL Place 2,500 mcg under the tongue daily.     [provider]  diclofenac sodium (VOLTAREN) 1 % GEL Apply 1-2 g topically 4 (four) times daily as needed (knee pain.).    [provider]  Melatonin 3 MG TABS Take 6 mg by mouth at bedtime as needed (sleep.).    [provider]  Multiple Vitamin (MULTIVITAMIN WITH MINERALS) TABS Take 1 tablet by mouth daily.    [provider]  nitrofurantoin, macrocrystal-monohydrate, (MACROBID) 100 MG capsule Take 1 capsule (100 mg total) by mouth 2 (two) times  daily for 10 days. 03/11/22 03/21/22  Shelda Pal, DO  Omega-3 Fatty Acids (FISH OIL ULTRA) 1400 MG CAPS Take 1,400 mg by mouth daily.    [provider]  phenazopyridine (PYRIDIUM) 100 MG tablet Take 1 tablet (100 mg total) by mouth 3 (three) times daily as needed for pain. 01/07/22   Brunetta Jeans, PA-C  Probiotic Product (PROBIOTIC FORMULA PO) Take 1 capsule by mouth daily.    [provider]  rosuvastatin (CRESTOR) 5 MG tablet Take 1 tablet (5 mg total) by mouth daily. 03/10/22   Shelda Pal, DO  senna-docusate  (SENOKOT-S) 8.6-50 MG tablet Take 1 tablet by mouth daily. 09/19/21   Long, Wonda Olds, MD  valACYclovir (VALTREX) 1000 MG tablet Take 1 tablet (1,000 mg total) by mouth daily as needed. 08/02/21   Shelda Pal, DO                                                                                                                                    Allergies Ceftin, Vancomycin, Bactrim [sulfamethoxazole-trimethoprim], and Levofloxacin  Review of Systems Review of Systems As noted in HPI  Physical Exam Vital Signs  I have reviewed the triage vital signs BP (!) 156/69   Pulse 70   Temp 97.8 F (36.6 C) (Oral)   Resp 20   Ht '5\' 8"'$  (1.727 m)   Wt 68 kg   SpO2 99%   BMI 22.81 kg/m   Physical Exam Vitals reviewed.  Constitutional:      General: She is not in acute distress.    Appearance: She is well-developed. She is not diaphoretic.  HENT:     Head: Normocephalic and atraumatic.     Nose: Nose normal.  Eyes:     General: No scleral icterus.       Right eye: No discharge.        Left eye: No discharge.     Conjunctiva/sclera: Conjunctivae normal.     Pupils: Pupils are equal, round, and reactive to light.  Cardiovascular:     Rate and Rhythm: Normal rate and regular rhythm.     Heart sounds: No murmur heard.    No friction rub. No gallop.  Pulmonary:     Effort: Pulmonary effort is normal. No respiratory distress.     Breath sounds: Normal breath sounds. No stridor. No rales.  Abdominal:     General: There is no distension.     Palpations: Abdomen is soft.     Tenderness: There is no abdominal tenderness.  Musculoskeletal:        General: No tenderness.     Cervical back: Normal range of motion and neck supple.  Skin:    General: Skin is warm and dry.     Findings: No erythema or rash.  Neurological:     Mental Status: She is alert and oriented to person, place, and time.  Comments: Mental Status:  Alert and oriented to person, place, and time.  Attention  and concentration normal.  Speech clear.  Recent memory is intact  Cranial Nerves:  II Visual Fields: Intact to confrontation. Visual fields intact. III, IV, VI: Pupils equal and reactive to light and near. Full eye movement without nystagmus  V Facial Sensation: Normal. No weakness of masticatory muscles  VII: No facial weakness or asymmetry  VIII Auditory Acuity: Grossly normal  IX/X: The uvula is midline; the palate elevates symmetrically  XI: Normal sternocleidomastoid and trapezius strength  XII: The tongue is midline. No atrophy or fasciculations.   Motor System: Muscle Strength: 5/5 and symmetric in the upper and lower extremities. No pronation or drift.  Muscle Tone: Tone and muscle bulk are normal in the upper and lower extremities.  Coordination: Intact finger-to-nose.  No tremor.  Sensation: Intact to light touch. Gait: Routine gait normal.      ED Results and Treatments Labs (all labs ordered are listed, but only abnormal results are displayed) Labs Reviewed  COMPREHENSIVE METABOLIC PANEL - Abnormal; Notable for the following components:      Result Value   Potassium 3.2 (*)    Glucose, Bld 135 (*)    All other components within normal limits  RESP PANEL BY RT-PCR (FLU A&B, COVID) ARPGX2  PROTIME-INR  APTT  CBC  DIFFERENTIAL  RAPID URINE DRUG SCREEN, HOSP PERFORMED  URINALYSIS, ROUTINE W REFLEX MICROSCOPIC  MAGNESIUM  TROPONIN I (HIGH SENSITIVITY)  TROPONIN I (HIGH SENSITIVITY)  TROPONIN I (HIGH SENSITIVITY)                                                                                                                         EKG  EKG Interpretation  Date/Time:  Thursday March 20 2022 03:00:45 EDT Ventricular Rate:  71 PR Interval:  219 QRS Duration: 178 QT Interval:  459 QTC Calculation: 499 R Axis:   -77 Text Interpretation: Atrial-sensed ventricular-paced rhythm No significant change was found Confirmed by Addison Lank [phone]) on 03/20/2022 3:06:47  AM       Radiology CT HEAD CODE STROKE WO CONTRAST  Result Date: 03/20/2022 CLINICAL DATA:  Code stroke. Initial evaluation for acute stroke, left arm weakness. EXAM: CT HEAD WITHOUT CONTRAST TECHNIQUE: Contiguous axial images were obtained from the base of the skull through the vertex without intravenous contrast. RADIATION DOSE REDUCTION: This exam was performed according to the departmental dose-optimization program which includes automated exposure control, adjustment of the mA and/or kV according to patient size and/or use of iterative reconstruction technique. COMPARISON:  Prior study from 01/30/2018. FINDINGS: Brain: Cerebral volume within normal limits for patient age. No evidence for acute intracranial hemorrhage. No findings to suggest acute large vessel territory infarct. No mass lesion, midline shift, or mass effect. Ventricles are normal in size without evidence for hydrocephalus. No extra-axial fluid collection identified. Vascular: No hyperdense vessel identified. Skull: Scalp soft tissues demonstrate no acute abnormality. Calvarium intact. Sinuses/Orbits: Globes and orbital  soft tissues within normal limits. Visualized paranasal sinuses are clear. No mastoid effusion. ASPECTS [facility] Stroke Program Early CT Score) - Ganglionic level infarction (caudate, lentiform nuclei, internal capsule, insula, M1-M3 cortex): 7 - Supraganglionic infarction (M4-M6 cortex): 3 Total score (0-10 with 10 being normal): 10 IMPRESSION: 1. Normal head CT.  No acute intracranial abnormality. 2. ASPECTS is 10. Critical Value/emergent results were called by telephone at the time of interpretation on 03/20/2022 at 2:56 am to provider [facility] , who verbally acknowledged these results. Electronically Signed   By: Jeannine Boga M.D.   On: 03/20/2022 02:57    Pertinent labs & imaging results that were available during my care of the patient were reviewed by me and considered in my medical decision making  (see MDM for details).  Medications Ordered in ED Medications  sodium chloride 0.9 % bolus 1,000 mL (0 mLs Intravenous Stopped 03/20/22 0520)    Followed by  0.9 %  sodium chloride infusion (1,000 mLs Intravenous New Bag/Given 03/20/22 0521)  potassium chloride SA (KLOR-CON M) CR tablet 40 mEq (40 mEq Oral Given 03/20/22 0400)                                                                                                                                     Procedures Procedures  (including critical care time)  Medical Decision Making / ED Course    Complexity of Problem:  Co-morbidities/SDOH that complicate the patient evaluation/care: Noted above in HPI  Patient's presenting problem/concern, DDX, and MDM listed below: Left arm numbness and tingling. Concerning for CVA vs atypical ACS vs pacer malfunction. Also considering electrolyte derangements, orthostasis, medication side effect.  Hospitalization Considered:  Yes  Initial Intervention:  IV fluids    Complexity of Data:   Cardiac Monitoring: The patient was maintained on a cardiac monitor.   I personally viewed and interpreted the cardiac monitored which showed an underlying rhythm of paced rhythm in the 70s to 100s EKG showed paced rhythm.  Laboratory Tests ordered listed below with my independent interpretation: CBC without leukocytosis or anemia Metabolic panel with mild hypokalemia.  No other significant electrolyte derangements or renal sufficiency Initial troponin negative Second troponin up trended from 4-16.  We will repeat   Imaging Studies ordered listed below with my independent interpretation: CT stroke unremarkable Aspects score of 10     ED Course:    Assessment, Add'l Intervention, and Reassessment: Near syncopal symptoms with left arm discomfort Consulted teleneurology for possible CVA and given her symptoms and reassuring CT they felt that CVA was unlikely. On telemetry patient noted to have  drops in heart rate down to the 50s and 40s.  Patient reported being symptomatic. Pacer interrogation revealed that the patient's sensitivity was high to accommodate for noise.  They also noted that she was having times or her pacer was not capturing and her heart rate would drop down to the 30s and  40s. The patient was readjusted from DDD 50 to DOO 70.  Patient reported feeling better in the setting. Patient hypokalemia was repleted orally. I believe patient requires admission to the hospital for continued work-up and management likely.  Patient of her pacing device vs replacement. Will continue to trend trop.     Final Clinical Impression(s) / ED Diagnoses Final diagnoses:  Near syncope           This chart was dictated using voice recognition software.  Despite best efforts to proofread,  errors can occur which can change the documentation meaning.    Fatima Blank, MD 03/20/22 [phone]

## 2022-03-21 ENCOUNTER — Encounter: Payer: Self-pay | Admitting: Student

## 2022-03-21 ENCOUNTER — Telehealth: Payer: Self-pay | Admitting: Internal Medicine

## 2022-03-21 DIAGNOSIS — Z79899 Other long term (current) drug therapy: Secondary | ICD-10-CM | POA: Diagnosis not present

## 2022-03-21 DIAGNOSIS — T82111A Breakdown (mechanical) of cardiac pulse generator (battery), initial encounter: Secondary | ICD-10-CM | POA: Diagnosis not present

## 2022-03-21 DIAGNOSIS — Z86718 Personal history of other venous thrombosis and embolism: Secondary | ICD-10-CM | POA: Diagnosis not present

## 2022-03-21 DIAGNOSIS — Z950 Presence of cardiac pacemaker: Secondary | ICD-10-CM | POA: Diagnosis not present

## 2022-03-21 DIAGNOSIS — Z7982 Long term (current) use of aspirin: Secondary | ICD-10-CM | POA: Diagnosis not present

## 2022-03-21 DIAGNOSIS — Z96651 Presence of right artificial knee joint: Secondary | ICD-10-CM | POA: Diagnosis not present

## 2022-03-21 DIAGNOSIS — T82110A Breakdown (mechanical) of cardiac electrode, initial encounter: Secondary | ICD-10-CM | POA: Diagnosis not present

## 2022-03-21 DIAGNOSIS — R55 Syncope and collapse: Secondary | ICD-10-CM | POA: Diagnosis not present

## 2022-03-21 DIAGNOSIS — I442 Atrioventricular block, complete: Secondary | ICD-10-CM | POA: Diagnosis not present

## 2022-03-21 DIAGNOSIS — Z20822 Contact with and (suspected) exposure to covid-19: Secondary | ICD-10-CM | POA: Diagnosis not present

## 2022-03-21 NOTE — Plan of Care (Signed)

## 2022-03-21 NOTE — Pre-Procedure Instructions (Signed)
Instructed patient on the following items: Arrival time 1200 Nothing to eat or drink after midnight No meds AM of procedure Responsible person to drive you home and stay with you for 24 hrs Wash with special soap night before and morning of procedure  

## 2022-03-21 NOTE — Telephone Encounter (Signed)
Patient states she has a procedure scheduled for 7/31, but was summoned to jury duty so she needs a note to excuse her from it.

## 2022-03-21 NOTE — Progress Notes (Signed)
Pt got discharged to home, discharge instructions provided and patient showed understanding to it, IV taken out,Telemonitor DC,pt left unit in wheelchair with all of the belongings accompanied with a family member (spouse)  Palma Holter, Therapist, sports

## 2022-03-21 NOTE — Telephone Encounter (Signed)
Has summons for jury duty to call in on Sunday evening 8/6 for potential jury duty starting 03/31/22.  She is having PPM lead extraction/reimplantation on 7/31 and would like a note to be excused from her jury duty obligation.   She is aware that Dr. Lovena Le won't be back in the office before he sees her for procedure Monday and that his nurse is off today.     I told her they will follow up with her next week.

## 2022-03-21 NOTE — Discharge Instructions (Addendum)
Implantable Device Instructions    Kari [name]  03/21/2022  You are scheduled for a Pacemaker extraction and re-implantation on Monday, July 31 with Dr. Cristopher Peru.  1. Pre procedure Lab testing:  You had labs on presentation to [facility], and will not need additional labwork prior to the day of surgery.     2. Please arrive at the Main Entrance A at [facility]: [place], [place] [address] on July 31 at 12:30 PM (This time is two hours before your procedure to ensure your preparation). Free valet parking service is available. You will check in at ADMITTING. The support person will be asked to wait in the waiting room.  It is OK to have someone drop you off and come back when you are ready to be discharged.        Special note: Every effort is made to have your procedure done on time. Please understand that emergencies sometimes delay  scheduled procedures.  3.  No eating or drinking after midnight prior to procedure.     4.  Medication instructions:  On the morning of your procedure DO NOT take any medication.   5.  The night before your procedure and the morning of your procedure scrub your neck/chest with CHG surgical scrub.  See instruction letter.  6. Plan to go home the same day, you will only stay overnight if medically necessary. 7.  You MUST have a responsible adult to drive you home. 8.   An adult MUST be with you the first 24 hours after you arrive home. 18..  Bring a current list of your medications, and the last time and date medication taken. 10. Bring ID and current insurance cards. 11. .Please wear clothes that are easy to get on and off and wear slip-on shoes.    You will follow up with the [place] clinic 10-14 days after your procedure.  You will follow up with Dr. Cristopher Peru 91 days after your procedure.  These appointments will be made for you.   * If you have ANY questions after you get home, please call the office at  [phone] or send a MyChart message.  FYI: For your safety, and to allow [place] to monitor your vital signs accurately during the surgery/procedure we request that if you have artificial nails, gel coating, SNS etc. Please have those removed prior to your surgery/procedure. Not having the nail coverings /polish removed may result in cancellation or delay of your surgery/procedure.    [place] [facility] - Preparing For Surgery    Before surgery, you can play an important role. Because skin is not sterile, your skin needs to be as free of germs as possible. You can reduce the number of germs on your skin by washing with CHG (chlorahexidine gluconate) Soap before surgery.  CHG is an antiseptic cleaner which kills germs and bonds with the skin to continue killing germs even after washing.  Please do not use if you have an allergy to CHG or antibacterial soaps.  If your skin becomes reddened/irritated stop using the CHG.   Do not shave (including legs and underarms) for at least 48 hours prior to first CHG shower.  It is OK to shave your face.  Please follow these instructions carefully:  1.  Shower the night before surgery and the morning of surgery with CHG.  2.  If you choose to wash your hair, wash your hair first as usual  with your normal shampoo.  3.  After you shampoo, rinse your hair and body thoroughly to remove the shampoo.  4.  Use CHG as you would any other liquid soap.  You can apply CHG directly to the skin and wash gently with a clean washcloth. 5.  Apply the CHG Soap to your body ONLY FROM THE NECK DOWN.  Do not use on open wounds or open sores.  Avoid contact with your eyes, ears, mouth and genitals (private parts).  Wash genitals (private parts) with your normal soap.  6.  Wash thoroughly, paying special attention to the area where your surgery will be performed.  7.  Thoroughly rinse your body with warm water from the neck down.   8.  DO NOT shower/wash with your normal soap after using  and rinsing off the CHG soap.  9.  Pat yourself dry with a clean towel.           10.  Wear clean pajamas.           11.  Place clean sheets on your bed the night of your first shower and do not sleep with pets.  Day of Surgery: Do not apply any deodorants/lotions.  Please wear clean clothes to the hospital/surgery center.

## 2022-03-22 NOTE — Telephone Encounter (Signed)
We will take care of this at hospital discharge. GT

## 2022-03-23 ENCOUNTER — Telehealth: Payer: Self-pay | Admitting: Physician Assistant

## 2022-03-23 NOTE — Telephone Encounter (Signed)
   Pt feels terrible.  Her HR is still at 70, but she is tired and weak.   Not light-headed or dizzy, but has no energy.   She feels like s**t.   Got her to check her HR, it is 70. Does not have BP cuff, no BP  Advised that she can come to the ER to be checked or wait at home till tomorrow to come in for her lead extraction.   She will think about it and let me know.  Rosaria Ferries, PA-C 03/23/2022 9:15 AM

## 2022-03-24 ENCOUNTER — Inpatient Hospital Stay (HOSPITAL_BASED_OUTPATIENT_CLINIC_OR_DEPARTMENT_OTHER): Payer: 59 | Admitting: Certified Registered"

## 2022-03-24 ENCOUNTER — Telehealth: Payer: Self-pay | Admitting: Cardiology

## 2022-03-24 ENCOUNTER — Ambulatory Visit (HOSPITAL_COMMUNITY)
Admission: RE | Disposition: A | Discharge status: Home / Self Care | Payer: Self-pay | Source: Home / Self Care | Attending: Internal Medicine

## 2022-03-24 ENCOUNTER — Encounter (HOSPITAL_COMMUNITY): Payer: Self-pay | Admitting: Internal Medicine

## 2022-03-24 ENCOUNTER — Inpatient Hospital Stay (HOSPITAL_COMMUNITY): Payer: 59 | Admitting: Certified Registered"

## 2022-03-24 ENCOUNTER — Ambulatory Visit (HOSPITAL_COMMUNITY)
Admission: RE | Admit: 2022-03-24 | Discharge: 2022-03-25 | Disposition: A | Discharge status: Home / Self Care | Payer: 59 | Attending: Internal Medicine | Admitting: Internal Medicine

## 2022-03-24 ENCOUNTER — Other Ambulatory Visit: Payer: Self-pay

## 2022-03-24 ENCOUNTER — Inpatient Hospital Stay (HOSPITAL_COMMUNITY): Payer: 59

## 2022-03-24 ENCOUNTER — Ambulatory Visit (HOSPITAL_BASED_OUTPATIENT_CLINIC_OR_DEPARTMENT_OTHER): Payer: 59

## 2022-03-24 DIAGNOSIS — M1990 Unspecified osteoarthritis, unspecified site: Secondary | ICD-10-CM | POA: Diagnosis not present

## 2022-03-24 DIAGNOSIS — Z0279 Encounter for issue of other medical certificate: Secondary | ICD-10-CM

## 2022-03-24 DIAGNOSIS — Z950 Presence of cardiac pacemaker: Secondary | ICD-10-CM

## 2022-03-24 DIAGNOSIS — Y831 Surgical operation with implant of artificial internal device as the cause of abnormal reaction of the patient, or of later complication, without mention of misadventure at the time of the procedure: Secondary | ICD-10-CM | POA: Diagnosis not present

## 2022-03-24 DIAGNOSIS — I442 Atrioventricular block, complete: Secondary | ICD-10-CM | POA: Diagnosis present

## 2022-03-24 DIAGNOSIS — R9431 Abnormal electrocardiogram [ECG] [EKG]: Secondary | ICD-10-CM | POA: Diagnosis not present

## 2022-03-24 DIAGNOSIS — K219 Gastro-esophageal reflux disease without esophagitis: Secondary | ICD-10-CM | POA: Insufficient documentation

## 2022-03-24 DIAGNOSIS — T82118A Breakdown (mechanical) of other cardiac electronic device, initial encounter: Secondary | ICD-10-CM

## 2022-03-24 DIAGNOSIS — I959 Hypotension, unspecified: Principal | ICD-10-CM

## 2022-03-24 DIAGNOSIS — T82190A Other mechanical complication of cardiac electrode, initial encounter: Principal | ICD-10-CM | POA: Insufficient documentation

## 2022-03-24 DIAGNOSIS — T829XXA Unspecified complication of cardiac and vascular prosthetic device, implant and graft, initial encounter: Secondary | ICD-10-CM

## 2022-03-24 DIAGNOSIS — T829XXD Unspecified complication of cardiac and vascular prosthetic device, implant and graft, subsequent encounter: Secondary | ICD-10-CM

## 2022-03-24 DIAGNOSIS — Z45018 Encounter for adjustment and management of other part of cardiac pacemaker: Secondary | ICD-10-CM

## 2022-03-24 HISTORY — PX: LEAD EXTRACTION: EP1211

## 2022-03-24 HISTORY — PX: PACEMAKER IMPLANT: EP1218

## 2022-03-24 LAB — BASIC METABOLIC PANEL
Anion gap: 9 (ref 5–15)
BUN: 14 mg/dL (ref 8–23)
CO2: 23 mmol/L (ref 22–32)
Calcium: 9.5 mg/dL (ref 8.9–10.3)
Chloride: 107 mmol/L (ref 98–111)
Creatinine, Ser: 0.72 mg/dL (ref 0.44–1.00)
GFR, Estimated: >60 mL/min (ref >60)
Glucose, Bld: 96 mg/dL (ref 70–99)
Potassium: 4.2 mmol/L (ref 3.5–5.1)
Sodium: 139 mmol/L (ref 135–145)

## 2022-03-24 LAB — ECHOCARDIOGRAM COMPLETE
AR max vel: 2.56 cm2
AV Area VTI: 2.68 cm2
AV Area mean vel: 2.52 cm2
AV Mean grad: 3.2 mmHg
AV Peak grad: 6.7 mmHg
Ao pk vel: 1.3 m/s
Area-P 1/2: 2.37 cm2
Height: 68 in
Weight: 2673.74 oz

## 2022-03-24 LAB — COMPREHENSIVE METABOLIC PANEL
ALT: 19 U/L (ref 0–44)
AST: 30 U/L (ref 15–41)
Albumin: 3.5 g/dL (ref 3.5–5.0)
Alkaline Phosphatase: 59 U/L (ref 38–126)
Anion gap: 7 (ref 5–15)
BUN: 18 mg/dL (ref 8–23)
CO2: 21 mmol/L — ABNORMAL LOW (ref 22–32)
Calcium: 8.6 mg/dL — ABNORMAL LOW (ref 8.9–10.3)
Chloride: 110 mmol/L (ref 98–111)
Creatinine, Ser: 0.84 mg/dL (ref 0.44–1.00)
GFR, Estimated: >60 mL/min (ref >60)
Glucose, Bld: 108 mg/dL — ABNORMAL HIGH (ref 70–99)
Potassium: 5.1 mmol/L (ref 3.5–5.1)
Sodium: 138 mmol/L (ref 135–145)
Total Bilirubin: 1.3 mg/dL — ABNORMAL HIGH (ref 0.3–1.2)
Total Protein: 6.1 g/dL — ABNORMAL LOW (ref 6.5–8.1)

## 2022-03-24 LAB — CBC
HCT: 39.3 % (ref 36.0–46.0)
Hemoglobin: 12.8 g/dL (ref 12.0–15.0)
MCH: 30.8 pg (ref 26.0–34.0)
MCHC: 32.6 g/dL (ref 30.0–36.0)
MCV: 94.7 fL (ref 80.0–100.0)
Platelets: 187 10*3/uL (ref 150–400)
RBC: 4.15 MIL/uL (ref 3.87–5.11)
RDW: 13.9 % (ref 11.5–15.5)
WBC: 14.3 10*3/uL — ABNORMAL HIGH (ref 4.0–10.5)
nRBC: 0 % (ref 0.0–0.2)

## 2022-03-24 LAB — TROPONIN I (HIGH SENSITIVITY): Troponin I (High Sensitivity): 1341 ng/L (ref <18)

## 2022-03-24 LAB — PREPARE RBC (CROSSMATCH)

## 2022-03-24 LAB — GLUCOSE, CAPILLARY: Glucose-Capillary: 122 mg/dL — ABNORMAL HIGH (ref 70–99)

## 2022-03-24 SURGERY — LEAD EXTRACTION
Anesthesia: General

## 2022-03-24 MED ORDER — FENTANYL CITRATE (PF) 250 MCG/5ML IJ SOLN
INTRAMUSCULAR | Status: DC | PRN
  Administered 2022-03-24: 100 ug via INTRAVENOUS

## 2022-03-24 MED ORDER — LIDOCAINE 2% (20 MG/ML) 5 ML SYRINGE
INTRAMUSCULAR | Status: DC | PRN
  Administered 2022-03-24: 60 mg via INTRAVENOUS

## 2022-03-24 MED ORDER — SODIUM CHLORIDE 0.9% IV SOLUTION: Freq: Once | INTRAVENOUS | Status: AC

## 2022-03-24 MED ORDER — SODIUM CHLORIDE 0.9 % IV SOLN: INTRAVENOUS | Status: DC

## 2022-03-24 MED ORDER — ACETAMINOPHEN 325 MG PO TABS
325.0000 mg | ORAL_TABLET | ORAL | Status: DC | PRN
  Administered 2022-03-25: 325 mg via ORAL
  Filled 2022-03-24: qty 2

## 2022-03-24 MED ORDER — SODIUM CHLORIDE 0.9 % IV BOLUS
500.0000 mL | Freq: Once | INTRAVENOUS | Status: AC
Start: 2022-03-24 — End: 2022-03-24
  Administered 2022-03-24: 500 mL via INTRAVENOUS

## 2022-03-24 MED ORDER — ACETAMINOPHEN 10 MG/ML IV SOLN
INTRAVENOUS | Status: AC
  Filled 2022-03-24: qty 100

## 2022-03-24 MED ORDER — LIDOCAINE HCL 1 % IJ SOLN
INTRAMUSCULAR | Status: AC
  Filled 2022-03-24: qty 60

## 2022-03-24 MED ORDER — SODIUM CHLORIDE 0.9 % IV SOLN
INTRAVENOUS | Status: AC
  Filled 2022-03-24: qty 2

## 2022-03-24 MED ORDER — CHLORHEXIDINE GLUCONATE 4 % EX LIQD
4.0000 | Freq: Once | CUTANEOUS | Status: DC
  Filled 2022-03-24: qty 60

## 2022-03-24 MED ORDER — LACTATED RINGERS IV SOLN: INTRAVENOUS | Status: DC | PRN

## 2022-03-24 MED ORDER — PHENYLEPHRINE HCL-NACL 20-0.9 MG/250ML-% IV SOLN
INTRAVENOUS | Status: DC | PRN
  Administered 2022-03-24: 40 ug/min via INTRAVENOUS

## 2022-03-24 MED ORDER — DIPHENHYDRAMINE HCL 25 MG PO CAPS
ORAL_CAPSULE | ORAL | Status: AC
  Administered 2022-03-24: 25 mg via ORAL
  Filled 2022-03-24: qty 1

## 2022-03-24 MED ORDER — SUGAMMADEX SODIUM 200 MG/2ML IV SOLN
INTRAVENOUS | Status: DC | PRN
  Administered 2022-03-24: 150 mg via INTRAVENOUS

## 2022-03-24 MED ORDER — ONDANSETRON HCL 4 MG/2ML IJ SOLN
INTRAMUSCULAR | Status: DC | PRN
  Administered 2022-03-24: 4 mg via INTRAVENOUS

## 2022-03-24 MED ORDER — CEFAZOLIN SODIUM-DEXTROSE 1-4 GM/50ML-% IV SOLN
1.0000 g | Freq: Four times a day (QID) | INTRAVENOUS | Status: DC
  Administered 2022-03-24 – 2022-03-25 (×2): 1 g via INTRAVENOUS
  Filled 2022-03-24 (×3): qty 50

## 2022-03-24 MED ORDER — ONDANSETRON HCL 4 MG/2ML IJ SOLN
4.0000 mg | Freq: Four times a day (QID) | INTRAMUSCULAR | Status: DC | PRN
  Administered 2022-03-24: 4 mg via INTRAVENOUS
  Filled 2022-03-24 (×2): qty 2

## 2022-03-24 MED ORDER — CEFAZOLIN SODIUM-DEXTROSE 2-4 GM/100ML-% IV SOLN
2.0000 g | INTRAVENOUS | Status: AC
  Administered 2022-03-24: 2 g via INTRAVENOUS

## 2022-03-24 MED ORDER — ACETAMINOPHEN 10 MG/ML IV SOLN
1000.0000 mg | Freq: Once | INTRAVENOUS | Status: DC | PRN
  Administered 2022-03-24: 1000 mg via INTRAVENOUS

## 2022-03-24 MED ORDER — MIDAZOLAM HCL 2 MG/2ML IJ SOLN
INTRAMUSCULAR | Status: DC | PRN
  Administered 2022-03-24: 2 mg via INTRAVENOUS

## 2022-03-24 MED ORDER — PROPOFOL 10 MG/ML IV BOLUS
INTRAVENOUS | Status: DC | PRN
  Administered 2022-03-24: 140 mg via INTRAVENOUS

## 2022-03-24 MED ORDER — SODIUM CHLORIDE 0.9 % IV SOLN
10.0000 mL/h | Freq: Once | INTRAVENOUS | Status: AC
  Administered 2022-03-24: 10 mL/h via INTRAVENOUS

## 2022-03-24 MED ORDER — SODIUM CHLORIDE 0.9 % IV SOLN
80.0000 mg | INTRAVENOUS | Status: AC
  Administered 2022-03-24: 80 mg

## 2022-03-24 MED ORDER — CHLORHEXIDINE GLUCONATE 0.12 % MT SOLN
OROMUCOSAL | Status: AC
  Administered 2022-03-24: 15 mL
  Filled 2022-03-24: qty 15

## 2022-03-24 MED ORDER — CEFAZOLIN SODIUM-DEXTROSE 2-4 GM/100ML-% IV SOLN
INTRAVENOUS | Status: AC
  Filled 2022-03-24: qty 100

## 2022-03-24 MED ORDER — PHENYLEPHRINE 80 MCG/ML (10ML) SYRINGE FOR IV PUSH (FOR BLOOD PRESSURE SUPPORT)
PREFILLED_SYRINGE | INTRAVENOUS | Status: DC | PRN
  Administered 2022-03-24 (×2): 80 ug via INTRAVENOUS
  Administered 2022-03-24: 160 ug via INTRAVENOUS
  Administered 2022-03-24: 80 ug via INTRAVENOUS

## 2022-03-24 MED ORDER — HEPARIN (PORCINE) IN NACL 1000-0.9 UT/500ML-% IV SOLN
INTRAVENOUS | Status: DC | PRN
  Administered 2022-03-24: 500 mL

## 2022-03-24 MED ORDER — LACTATED RINGERS IV SOLN: INTRAVENOUS | Status: DC

## 2022-03-24 MED ORDER — FENTANYL CITRATE (PF) 100 MCG/2ML IJ SOLN: 25.0000 ug | INTRAMUSCULAR | Status: DC | PRN

## 2022-03-24 MED ORDER — DIPHENHYDRAMINE HCL 25 MG PO CAPS
25.0000 mg | ORAL_CAPSULE | Freq: Once | ORAL | Status: AC
  Filled 2022-03-24: qty 1

## 2022-03-24 MED ORDER — ROCURONIUM BROMIDE 10 MG/ML (PF) SYRINGE
PREFILLED_SYRINGE | INTRAVENOUS | Status: DC | PRN
  Administered 2022-03-24: 20 mg via INTRAVENOUS
  Administered 2022-03-24: 50 mg via INTRAVENOUS
  Administered 2022-03-24: 10 mg via INTRAVENOUS

## 2022-03-24 SURGICAL SUPPLY — 21 items
CABLE ADAPT PACING TEMP 12FT (ADAPTER) ×2 IMPLANT
CATH S G BIP PACING (CATHETERS) ×2 IMPLANT
CATH SELECT PACE 669183 (CATHETERS) ×2 IMPLANT
COIL ONE TIE COMPRESSION (VASCULAR PRODUCTS) ×2 IMPLANT
DEVICE LCKNG LEAD CARDIAC (CATHETERS) IMPLANT
FELT TEFLON 1X6 (MISCELLANEOUS) ×3 IMPLANT
LEAD INGEVITY 7841 52 (Lead) ×2 IMPLANT
LEAD INGEVITY 7842 59 (Lead) ×2 IMPLANT
PACEMAKER ACCOLADE DR-EL (Pacemaker) ×2 IMPLANT
POUCH AIGIS-R ANTIBACT PPM (Mesh General) ×2 IMPLANT
POUCH AIGIS-R ANTIBACT PPM MED (Mesh General) IMPLANT
REMOVAL LLD CARDIAC LEAD EZ (CATHETERS) ×2
SHEATH 7FR PRELUDE SNAP 13 (SHEATH) ×2 IMPLANT
SHEATH 8FR PRELUDE SNAP 13 (SHEATH) ×2 IMPLANT
SHEATH EVOLUTION RL 11F (SHEATH) ×2 IMPLANT
SHEATH EVOLUTION SHORTE RL 11F (SHEATH) ×2 IMPLANT
SHEATH EVOLUTION SHORTIE RL 9F (SHEATH) IMPLANT
SHEATH PINNACLE 6F 10CM (SHEATH) ×2 IMPLANT
SLEEVE REPOSITIONING LENGTH 30 (MISCELLANEOUS) ×2 IMPLANT
STYLET LIBERATOR LOCKING (MISCELLANEOUS) ×4 IMPLANT
WIRE HI TORQ VERSACORE-J 145CM (WIRE) ×4 IMPLANT

## 2022-03-24 NOTE — Plan of Care (Signed)
  Problem: Activity: Goal: Risk for activity intolerance will decrease Outcome: Progressing   Problem: Coping: Goal: Level of anxiety will decrease Outcome: Progressing   Problem: Safety: Goal: Ability to remain free from injury will improve Outcome: Progressing   

## 2022-03-24 NOTE — Anesthesia Preprocedure Evaluation (Addendum)
Anesthesia Evaluation  Patient identified by MRN, date of birth, ID band Patient awake    Reviewed: Allergy & Precautions, NPO status , Patient's Chart, lab work & pertinent test results  History of Anesthesia Complications (+) PONV and history of anesthetic complications  Airway Mallampati: II  TM Distance: >3 FB Neck ROM: Full    Dental no notable dental hx.    Pulmonary neg pulmonary ROS,    Pulmonary exam normal        Cardiovascular negative cardio ROS  + pacemaker (2014 implant, PM dependent DOO '@70'$ )  Rhythm:Regular Rate:Normal  Lead noise   Neuro/Psych negative neurological ROS  negative psych ROS   GI/Hepatic Neg liver ROS, GERD  ,  Endo/Other  negative endocrine ROS  Renal/GU negative Renal ROS  negative genitourinary   Musculoskeletal  (+) Arthritis , Osteoarthritis,    Abdominal Normal abdominal exam  (+)   Peds  Hematology negative hematology ROS (+)   Anesthesia Other Findings   Reproductive/Obstetrics                           Anesthesia Physical Anesthesia Plan  ASA: 3  Anesthesia Plan: General   Post-op Pain Management:    Induction: Intravenous  PONV Risk Score and Plan: 4 or greater and Ondansetron, Dexamethasone and Treatment may vary due to age or medical condition  Airway Management Planned: Mask and Oral ETT  Additional Equipment: Arterial line and TEE  Intra-op Plan:   Post-operative Plan: Extubation in OR  Informed Consent: I have reviewed the patients History and Physical, chart, labs and discussed the procedure including the risks, benefits and alternatives for the proposed anesthesia with the patient or authorized representative who has indicated his/her understanding and acceptance.     Dental advisory given  Plan Discussed with: CRNA  Anesthesia Plan Comments: (TEE MONITORING PURPOSES  Lab Results      Component                Value                Date                      WBC                      7.2                 03/20/2022                HGB                      13.4                03/20/2022                HCT                      40.2                03/20/2022                MCV                      93.1                03/20/2022  PLT                      244                 03/20/2022           Lab Results      Component                Value               Date                      NA                       138                 03/20/2022                K                        3.2 (L)             03/20/2022                CO2                      25                  03/20/2022                GLUCOSE                  135 (H)             03/20/2022                BUN                      14                  03/20/2022                CREATININE               0.77                03/20/2022                CALCIUM                  9.3                 03/20/2022                GFRNONAA                 >60                 03/20/2022          )       Anesthesia Quick Evaluation

## 2022-03-24 NOTE — Anesthesia Procedure Notes (Addendum)
Arterial Line Insertion Start/End7/31/2023 12:30 PM, 03/24/2022 1:00 PM Performed by: Darral Dash, DO, Lavell Luster, CRNA, anesthesiologist  Patient location: Pre-op. Preanesthetic checklist: patient identified, IV checked, site marked, risks and benefits discussed, surgical consent, monitors and equipment checked, pre-op evaluation, timeout performed and anesthesia consent Lidocaine 1% used for infiltration Right, radial was placed Hand hygiene performed  and maximum sterile barriers used  Allen's test indicative of satisfactory collateral circulation Attempts: 3 Procedure performed using ultrasound guided technique. Ultrasound Notes:anatomy identified, needle tip was noted to be adjacent to the nerve/plexus identified and no ultrasound evidence of intravascular and/or intraneural injection Following insertion, dressing applied and Biopatch. Post procedure assessment: normal  Post procedure complications: unsuccessful attempts and second provider assisted. Patient tolerated the procedure well with no immediate complications.

## 2022-03-24 NOTE — Transfer of Care (Signed)
Immediate Anesthesia Transfer of Care Note  Patient: Kari [name]  Procedure(s) Performed: LEAD EXTRACTION PACEMAKER IMPLANT  Patient Location: PACU  Anesthesia Type:General  Level of Consciousness: awake, alert  and oriented  Airway & Oxygen Therapy: Patient Spontanous Breathing and Patient connected to nasal cannula oxygen  Post-op Assessment: Report given to RN and Post -op Vital signs reviewed and stable  Post vital signs: Reviewed and stable  Last Vitals:  Vitals Value Taken Time  BP 124/78 03/24/22 1748  Temp    Pulse 71 03/24/22 1753  Resp 14 03/24/22 1753  SpO2 97 % 03/24/22 1753  Vitals shown include unvalidated device data.  Last Pain:  Vitals:   03/24/22 1225  TempSrc: Oral         Complications: There were no known notable events for this encounter.

## 2022-03-24 NOTE — Progress Notes (Addendum)
    Called by nurse for systolic BP in the 64R, pt  was lethargic - dizzy, nauseated -  some chest/shoulder discomfort.  Glucose was 122 .  She is sitting up in bed but does not tolerate being flat due to increase of dizziness.   EKG with pacing.   Neuro sleepy but alert and oriented Heart S1S2 RRR Lungs clear ant.  No lower ext edema.  Will proceed with echo to eval for tamponade.  Could be dehydrated with NPO and procedure.     Cecilie Kicks, FNP-C [facility]  CVK:[phone] or after 5pm and on weekends call [phone] 03/24/2022.    Attending note Patient with episode of hypotension, lethargy this evening. SBP down to 60s. She is s/p pacemaker lead extraction earlier today. NS bolus started, very quickly after initiation BP 109/70 with improved mentation. On my evaluation patient is sitting comfortable in no distress. Lungs are clear, no JVD. Surgical site clean without palpable hematoma. EKG shows A sensed V paced. Stat echo ordered, stat labs. From anesthesia notes received versed, propofol, fentanyl, rocurnoium. Did require some neosynephrine during. 300 mL estimated blood loss. Perhaps lingering effects of anesthesia with some hypovolemia given responded so quickly to IV fluids. F/u echo and labs   Addendum: Echo LVEF 65-70%, no WMAs, no pericardial effusion. Small IVC suggesting hypovolemia. SBPs remain 110s after 582m NS, will run NS at 1077mhr overnight.    JoCarlyle DollyD .

## 2022-03-24 NOTE — Progress Notes (Signed)
Pt. Refused to have stat labs drawn.

## 2022-03-24 NOTE — Progress Notes (Signed)
  Echocardiogram 2D Echocardiogram has been performed.  Kari [name] 03/24/2022, 8:22 PM

## 2022-03-24 NOTE — Telephone Encounter (Signed)
FMLA  Form and payment received from patient. Form placed in Dr. Tanna Furry box.

## 2022-03-24 NOTE — Progress Notes (Signed)
CBG 122.  Bolus started.

## 2022-03-24 NOTE — Progress Notes (Signed)
Branch MD at bedside. EKG completed.

## 2022-03-24 NOTE — Progress Notes (Signed)
Nurse spoke with rapid response nurse. Currently getting a blood sugar check.  Nurse spoke with PA.

## 2022-03-24 NOTE — Telephone Encounter (Signed)
We will work on this prior to discharge. GT

## 2022-03-24 NOTE — Progress Notes (Signed)
Pt. With critical troponin of 1341. On call for Cardiology paged to make aware.

## 2022-03-24 NOTE — Anesthesia Procedure Notes (Signed)
Procedure Name: Intubation Date/Time: 03/24/2022 3:30 PM  Performed by: Anastasio Auerbach, CRNAPre-anesthesia Checklist: Patient identified, Emergency Drugs available, Suction available and Patient being monitored Patient Re-evaluated:Patient Re-evaluated prior to induction Oxygen Delivery Method: Circle system utilized Preoxygenation: Pre-oxygenation with 100% oxygen Induction Type: IV induction Ventilation: Mask ventilation without difficulty Laryngoscope Size: Mac and 3 Tube type: Oral Number of attempts: 1 Airway Equipment and Method: Stylet and Oral airway Placement Confirmation: ETT inserted through vocal cords under direct vision, positive ETCO2 and breath sounds checked- equal and bilateral Tube secured with: Tape Dental Injury: Teeth and Oropharynx as per pre-operative assessment

## 2022-03-24 NOTE — H&P (Signed)
Patient ID: BROOK GERACI MRN: [id number], DOB/AGE: 69-09-1950 69 y.o.   Admit date: 03/20/2022 Date of Consult: 03/20/2022   Primary Physician: Shelda Pal, DO Primary Cardiologist: Cristopher Peru, MD  Electrophysiologist: Dr. Lovena Le   Reason for admission: Pacemaker malfunction   Patient Profile: ARBADELLA KIMBLER is a 71 y.o. female with a history of Heart block s/p DDD PPM 2014 who is being seen today for the evaluation of pacemaker malfunction at the request of Dr. Oval Linsey who received report of the patient from [facility].   HPI:  [place] is a 71 y.o. female with medical history as above. She has known history of noise on both leads. At last office visit in 2020 it was reproducible on the atrial lead but not the ventricular lead. Sensitivities had previously been adjusted in 2018 to cover this from sensing.     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  Around 0130 she was sitting and eating when she felt a wave of lightheadedness and clamminess come over her. No overt chest pain but "didn't feel right" in her heart. She felt some heaviness in her legs and left arm numbness as well.    No fevers, chills, nausea, vomiting, or diarrhea.      Pacer interrogation revealed inhibition due to ventricular lead noise leading to HRs in the 30-40s. Pt was programmed from DDD 50 to DOO 70 with relief of symptoms.    Pertinent labs on admission include Potassium3.2* (07/27 0242)  - Supp given. Magnesium  1.9 (07/27 0242) Creatinine, ser  0.77 (07/27 0242) PLT  244 (07/27 0242) HGB  13.4 (07/27 0242)  WBC 7.2   Discussed with Dr. Lovena Le who follows the patient who recommends she be scheduled for Pacemaker Extraction and new Pacemaker implantation.    Pt is currently asymptomatic at rest.  She feels "off" with programming of PPM to 70. Explained that this is due to lack of HR variability with  asynchronous pacing.        Past Medical History:  Diagnosis Date   Arthritis     Chronic back pain     Diverticulitis     DVT (deep venous thrombosis) [facility]) age 16    s/p  knee arthroscopy    Herpes zoster conjunctivitis     Menopause     Mobitz type 2 second degree AV block      2:1/notes 02/16/2013   PONV (postoperative nausea and vomiting)     Presence of permanent cardiac pacemaker     Seasonal allergies      "spring & fall; not q year" (02/17/2013)   Shoulder impingement      and left rotator cuff tear      Surgical History:       Past Surgical History:  Procedure Laterality Date   ACHILLES TENDON SURGERY       ARTHROGRAM KNEE Right 1975   CHOLECYSTECTOMY   1990's   colonscopy       ESOPHAGOGASTRODUODENOSCOPY       EYE SURGERY        muscle release right eye   INSERT / REPLACE / REMOVE PACEMAKER   02/17/2013    [employer] Advantio dual-chamber pacemaker, model [id number],   KNEE ARTHROSCOPY Right 1982, 1983, 1995, 1996   KNEE ARTHROSCOPY W/ ACL RECONSTRUCTION Left 1984   LAPAROSCOPIC SIGMOID COLECTOMY N/A 01/01/2015    Procedure: LAPAROSCOPIC SIGMOID COLECTOMY;  Surgeon: Dorris Fetch  Barry Dienes, MD;  Location: [place];  Service: General;  Laterality: N/A;   PERMANENT PACEMAKER INSERTION N/A 02/17/2013    Procedure: PERMANENT PACEMAKER INSERTION;  Surgeon: Evans Lance, MD;  Location: [facility] CATH LAB;  Service: Cardiovascular;  Laterality: N/A;   SHOULDER ARTHROSCOPY Left 09/05/2016    Procedure: LEFT SHOULDER ARTHROSCOPY , ACROMIOPLASTY AND ROTATOR CUFF REPAIR;  Surgeon: Melrose Nakayama, MD;  Location: [place];  Service: Orthopedics;  Laterality: Left;   TONSILLECTOMY   1961   TOTAL KNEE ARTHROPLASTY Right 2000; 06/2000    "replaced w/appropriate hardware" (02/17/2013)   TOTAL KNEE REVISION Right 04/13/2019    Procedure: Revision Right Knee Arthroplasty;  Surgeon: Frederik Pear, MD;  Location: [facility];  Service: Orthopedics;  Laterality: Right;   TRIGGER FINGER RELEASE Right ~ 2012     "thumb" (02/17/2013)   VAGINAL HYSTERECTOMY   ~ 1994    partial              Medications Prior to Admission  Medication Sig Dispense Refill Last Dose   Ascorbic Acid (VITAMIN C) 100 MG tablet Take 200 mg by mouth daily.         aspirin EC 81 MG tablet Take 81 mg by mouth daily.         Calcium Carb-Cholecalciferol (CALCIUM 600+D3 PO) Take 1 tablet by mouth daily.         COVID-19 mRNA bivalent vaccine, Pfizer, (PFIZER COVID-19 VAC BIVALENT) injection Inject into the muscle. 0.3 mL 0     COVID-19 mRNA Vac-TriS, Pfizer, SUSP injection Inject into the muscle. 0.3 mL 0     Cyanocobalamin (VITAMIN B-12) 2500 MCG SUBL Place 2,500 mcg under the tongue daily.          diclofenac sodium (VOLTAREN) 1 % GEL Apply 1-2 g topically 4 (four) times daily as needed (knee pain.).         Melatonin 3 MG TABS Take 6 mg by mouth at bedtime as needed (sleep.).         Multiple Vitamin (MULTIVITAMIN WITH MINERALS) TABS Take 1 tablet by mouth daily.         nitrofurantoin, macrocrystal-monohydrate, (MACROBID) 100 MG capsule Take 1 capsule (100 mg total) by mouth 2 (two) times daily for 10 days. 20 capsule 0     Omega-3 Fatty Acids (FISH OIL ULTRA) 1400 MG CAPS Take 1,400 mg by mouth daily.         phenazopyridine (PYRIDIUM) 100 MG tablet Take 1 tablet (100 mg total) by mouth 3 (three) times daily as needed for pain. 10 tablet 0     Probiotic Product (PROBIOTIC FORMULA PO) Take 1 capsule by mouth daily.         rosuvastatin (CRESTOR) 5 MG tablet Take 1 tablet (5 mg total) by mouth daily. 30 tablet 3     senna-docusate (SENOKOT-S) 8.6-50 MG tablet Take 1 tablet by mouth daily. 100 tablet 0     valACYclovir (VALTREX) 1000 MG tablet Take 1 tablet (1,000 mg total) by mouth daily as needed. 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[employer] [place]  Tobacco Use   Smoking status: Never   Smokeless tobacco: Never  Vaping Use   Vaping Use: Never used  Substance and Sexual Activity   Alcohol use: Yes      Comment: occasional wine    Drug use: No   Sexual activity: Yes      Birth control/protection: Surgical      Comment: 02/17/2013 "I have a same sex partner"  Other Topics Concern   Not on file  Social History Narrative   Not on file    Social Determinants of Health    Financial Resource Strain: Not on file  Food Insecurity: Not on file  Transportation Needs: Not on file  Physical Activity: Not on file  Stress: Not on file  Social Connections: Not on file  Intimate Partner Violence: Not on file           Family History  Problem Relation Age of Onset   Hypertension Mother     Hyperlipidemia Mother     Macular degeneration Mother     COPD Mother     Lung cancer Father     Cancer Father          Lung   COPD Father     Diabetes Brother     Breast cancer Paternal Aunt        Review of Systems: All other systems reviewed and are otherwise negative except as noted above.   Physical Exam:       Vitals:    03/20/22 0920 03/20/22 0943 03/20/22 1050 03/20/22 1051  BP:   (!) 164/86   (!) 153/78  Pulse: 69        Resp: (!) 21 15      Temp:   97.9 F (36.6 C) 97.6 F (36.4 C)    TempSrc:     Oral    SpO2: 99% 99%      Weight:          Height:              GEN- The patient is well appearing, alert and oriented x 3 today.   HEENT: normocephalic, atraumatic; sclera clear, conjunctiva pink; hearing intact; oropharynx clear; neck  supple Lungs- Clear to ausculation bilaterally, normal work of breathing.  No wheezes, rales, rhonchi Heart- Regular rate and rhythm, no murmurs, rubs or gallops GI- soft, non-tender, non-distended, bowel sounds present Extremities- no clubbing, cyanosis, or edema; DP/PT/radial pulses 2+ bilaterally MS- no significant deformity or atrophy Skin- warm and dry, no rash or lesion Psych- euthymic mood, full affect Neuro- strength and sensation are intact   Labs:   Recent Labs       Lab Results  Component Value Date    WBC 7.2 03/20/2022    HGB 13.4 03/20/2022    HCT 40.2 03/20/2022    MCV 93.1 03/20/2022    PLT 244 03/20/2022      Last Labs      Recent Labs  Lab 03/20/22 0242  NA 138  K 3.2*  CL 103  CO2 25  BUN 14  CREATININE 0.77  CALCIUM 9.3  PROT  7.7  BILITOT 1.1  ALKPHOS 74  ALT 20  AST 25  GLUCOSE 135*          Radiology/Studies:  Imaging Results  CT HEAD CODE STROKE WO CONTRAST   Result Date: 03/20/2022 CLINICAL DATA:  Code stroke. Initial evaluation for acute stroke, left arm weakness. EXAM: CT HEAD WITHOUT CONTRAST TECHNIQUE: Contiguous axial images were obtained from the base of the skull through the vertex without intravenous contrast. RADIATION DOSE REDUCTION: This exam was performed according to the departmental dose-optimization program which includes automated exposure control, adjustment of the mA and/or kV according to patient size and/or use of iterative reconstruction technique. COMPARISON:  Prior study from 01/30/2018. FINDINGS: Brain: Cerebral volume within normal limits for patient age. No evidence for acute intracranial hemorrhage. No findings to suggest acute large vessel territory infarct. No mass lesion, midline shift, or mass effect. Ventricles are normal in size without evidence for hydrocephalus. No extra-axial fluid collection identified. Vascular: No hyperdense vessel identified. Skull: Scalp soft tissues demonstrate no acute abnormality.  Calvarium intact. Sinuses/Orbits: Globes and orbital soft tissues within normal limits. Visualized paranasal sinuses are clear. No mastoid effusion. ASPECTS [facility] Stroke Program Early CT Score) - Ganglionic level infarction (caudate, lentiform nuclei, internal capsule, insula, M1-M3 cortex): 7 - Supraganglionic infarction (M4-M6 cortex): 3 Total score (0-10 with 10 being normal): 10 IMPRESSION: 1. Normal head CT.  No acute intracranial abnormality. 2. ASPECTS is 10. Critical Value/emergent results were called by telephone at the time of interpretation on 03/20/2022 at 2:56 am to provider [facility] , who verbally acknowledged these results. Electronically Signed   By: Jeannine Boga M.D.   On: 03/20/2022 02:57    XR HIP UNILAT W OR W/O PELVIS 1V LEFT   Result Date: 03/17/2022 An AP pelvis and lateral left hip shows no acute gross findings.  There is just slight signs of femoral acetabular impingement with a superior lateral bone spur off the femoral head which is present on the right hip.  There is also a small osteophyte just superior and posterior to the acetabulum.  The joint space itself is congruent.   DG Cervical Spine Complete   Result Date: 03/11/2022 CLINICAL DATA:  Pain EXAM: CERVICAL SPINE - COMPLETE 4 VIEW COMPARISON:  Cervical spine radiograph dated March 02, 2018 FINDINGS: Vertebral body heights are well-maintained. Moderate multilevel degenerative disc disease, most pronounced at C5-C6 and C6-C7 unchanged when compared to prior. Minimal grade 1 anterolisthesis of C7 on T1, unchanged when compared to prior exam and likely degenerative. Moderate to severe multilevel facet arthropathy, unchanged when compared with prior exam. Dens is intact on open-mouth view. Soft tissues are unremarkable. IMPRESSION: 1. No acute osseous abnormality. 2. Degenerative changes of the cervical spine, similar prior exam. Electronically Signed   By: Yetta Glassman M.D.   On: 03/11/2022 15:33      EKG:on  arrival shows AS/VP at 103 bpm (personally reviewed)   TELEMETRY: V paced at 70 (personally reviewed)   DEVICE HISTORY: DDD PPM 2014. Boston Generator, Two MDT [phone] leads   Assessment/Plan: 1.  Pacemaker Malfunction 2. Complete Heart Block Pt is dependent on device on check today.  RV noise inhibits pacing despite lowering sensitivity to 8.0 mV She is programmed DOO 70 and will remain so until Extraction, tentatively planned for Monday  Plan for observation overnight, then home to return as outpatient on Monday if device and symptoms remains stable.    Explained risks, benefits, and alternatives to PPM extraction and revision. Will  discuss again and more in depth as we get closer to the procedure. She understands significant risk and need for cardiothoracic surgery back up. Pt verbalized understanding and agrees to proceed at next available time. (Tentatively Monday)   For questions or updates, please contact [place] Please consult [email or web address] for contact info under Cardiology/STEMI.   Signed, Shirley Friar, PA-C  03/20/2022 11:28 AM     I have seen and examined this patient with Oda Kilts.  Agree with above, note added to reflect my findings.  She is a 71 year old lady with a history of heart block post pacemaker.  She has noise on both of her pacemaker leads.  There is been previous discussions about her noise and replacing or extracting the leads at the time of generator change.  She was in her usual state of health working this morning.  She had an episode of lightheadedness and clamminess.  There is no overt pain.  She had heaviness in her legs.  Pacemaker interrogation showed inhibition of the ventricular lead due to noise with heart rates in the 30s to 40s.  She is being reprogrammed to DOO 50 with complete relief of her symptoms.  She is laying in bed feeling well without complaint.   GEN: Well nourished, well developed, in no acute distress  HEENT: normal   Neck: no JVD, carotid bruits, or masses Cardiac: RRR; no murmurs, rubs, or gallops,no edema  Respiratory:  clear to auscultation bilaterally, normal work of breathing GI: soft, nontender, nondistended, + BS MS: no deformity or atrophy  Skin: warm and dry, device site well healed Neuro:  Strength and sensation are intact Psych: euthymic mood, full affect    Pacemaker lead malfunction: At this point, she will need lead revision.  She spoke with Dr. Lovena Le multiple times, and we will thus plan for lead extraction and reimplantation.  We will monitor her overnight tonight to ensure she will not have symptoms from the changes made to her pacemaker.  Lead extraction planned for Monday.   Will M. Camnitz MD 03/20/2022 3:23 PM   EP Attending  Patient seen and examined. Agree with the findings as noted above. The patient present for PM system extraction and insertion of a new DDD PM system. We may be able to salvage her atrial lead though she has had some noise on it. She has inhibition of ventricular output duet to ventricular lead noise. She will have a new RV and possible atrial lead and removal of her old PPM (2 years to ERI) and insertion of a new DDD PM. I have reviewed the indications/risks/benefits/goals/expectations of PPM insertion and she wishes to proceed.  Carleene Overlie Tamya Denardo,MD

## 2022-03-24 NOTE — Progress Notes (Signed)
Pt appears lethargic and pale. Currently getting EKG. [place] NP at bedside.

## 2022-03-24 NOTE — Progress Notes (Signed)
Pt arrived to unit. Pt refuses to have temperature, weight and assessment done. Pt stated not feeling good at this time. Pt states "not right now, I cant do anything right now"will page md to make aware.

## 2022-03-25 ENCOUNTER — Ambulatory Visit (HOSPITAL_COMMUNITY): Payer: 59

## 2022-03-25 ENCOUNTER — Telehealth: Payer: Self-pay | Admitting: Internal Medicine

## 2022-03-25 ENCOUNTER — Encounter (HOSPITAL_COMMUNITY): Payer: Self-pay | Admitting: Internal Medicine

## 2022-03-25 ENCOUNTER — Encounter: Payer: Self-pay | Admitting: Physician Assistant

## 2022-03-25 DIAGNOSIS — I442 Atrioventricular block, complete: Secondary | ICD-10-CM | POA: Diagnosis not present

## 2022-03-25 DIAGNOSIS — T82190A Other mechanical complication of cardiac electrode, initial encounter: Secondary | ICD-10-CM | POA: Diagnosis not present

## 2022-03-25 DIAGNOSIS — J9811 Atelectasis: Secondary | ICD-10-CM | POA: Diagnosis not present

## 2022-03-25 DIAGNOSIS — K219 Gastro-esophageal reflux disease without esophagitis: Secondary | ICD-10-CM | POA: Diagnosis not present

## 2022-03-25 LAB — CBC
HCT: 33.9 % — ABNORMAL LOW (ref 36.0–46.0)
Hemoglobin: 10.8 g/dL — ABNORMAL LOW (ref 12.0–15.0)
MCH: 31.1 pg (ref 26.0–34.0)
MCHC: 31.9 g/dL (ref 30.0–36.0)
MCV: 97.7 fL (ref 80.0–100.0)
Platelets: 173 10*3/uL (ref 150–400)
RBC: 3.47 MIL/uL — ABNORMAL LOW (ref 3.87–5.11)
RDW: 14 % (ref 11.5–15.5)
WBC: 11.2 10*3/uL — ABNORMAL HIGH (ref 4.0–10.5)
nRBC: 0 % (ref 0.0–0.2)

## 2022-03-25 LAB — BASIC METABOLIC PANEL
Anion gap: 7 (ref 5–15)
BUN: 17 mg/dL (ref 8–23)
CO2: 21 mmol/L — ABNORMAL LOW (ref 22–32)
Calcium: 8.5 mg/dL — ABNORMAL LOW (ref 8.9–10.3)
Chloride: 111 mmol/L (ref 98–111)
Creatinine, Ser: 0.85 mg/dL (ref 0.44–1.00)
GFR, Estimated: >60 mL/min (ref >60)
Glucose, Bld: 135 mg/dL — ABNORMAL HIGH (ref 70–99)
Potassium: 4.3 mmol/L (ref 3.5–5.1)
Sodium: 139 mmol/L (ref 135–145)

## 2022-03-25 MED FILL — Lidocaine HCl Local Preservative Free (PF) Inj 1%: INTRAMUSCULAR | Qty: 30 | Status: AC

## 2022-03-25 NOTE — Anesthesia Postprocedure Evaluation (Signed)
Anesthesia Post Note  Patient: Kari [name]  Procedure(s) Performed: LEAD EXTRACTION PACEMAKER IMPLANT     Patient location during evaluation: PACU Anesthesia Type: General Level of consciousness: awake and alert Pain management: pain level controlled Vital Signs Assessment: post-procedure vital signs reviewed and stable Respiratory status: spontaneous breathing, nonlabored ventilation, respiratory function stable and patient connected to nasal cannula oxygen Cardiovascular status: blood pressure returned to baseline and stable Postop Assessment: no apparent nausea or vomiting Anesthetic complications: no   There were no known notable events for this encounter.  Last Vitals:  Vitals:   03/25/22 0458 03/25/22 0904  BP: 104/60 104/66  Pulse:    Resp: 16 19  Temp: 36.8 C 36.4 C  SpO2:  96%    Last Pain:  Vitals:   03/25/22 0904  TempSrc: Oral  PainSc:                  March Rummage Anihya Tuma

## 2022-03-25 NOTE — Progress Notes (Signed)
Staff from [facility] personally called by pt. To assist her to bathroom. Pt. Telemetry leads removed.

## 2022-03-25 NOTE — Progress Notes (Signed)
Per orders. Nurse called radiology to have pt go for CXR.  Radiology will place transport to come pick pt up for cxr.

## 2022-03-25 NOTE — Telephone Encounter (Signed)
Pt forms were completed and returned back to front desk for completion  Thank you

## 2022-03-25 NOTE — Plan of Care (Signed)

## 2022-03-25 NOTE — Consult Note (Signed)
   [facility]patient Consult   03/25/2022  PIA JEDLICKA [birth year] [id number]   [place] Organization [[facility]] Patient: [place]  Primary Care Provider:  Shelda Pal, DO, with [facility] is an Embedded provider with a chronic care management/care coordination program and team  Met with patient at the bedside. Introduced self and reason for visit.  She endorses her primary care provider and states, "my main follow up will be with my cardiologist."  Explained that she 'may' receive a TOC call from her provider office.[Listed as OPIB status].  Asked if other follow up needed.  She declines any needs and denies issues for post hospital care. Gave her a reminder card for PCP appointment with a 24 hour nurse advise line number.     Plan: Patient denies follow up needs.   For additional questions or referrals please contact:   Natividad Brood, RN BSN [facility] Liaison  [phone] business mobile phone Toll free office [phone]  Fax number: [phone] [email or web address]

## 2022-03-25 NOTE — Progress Notes (Signed)
Patient given discharge instructions and stated understanding. 

## 2022-03-25 NOTE — TOC Transition Note (Signed)
Transition of Care [facility]) - CM/SW Discharge Note   Patient Details  Name: Kari [name] MRN: [id number] Date of Birth: [birth year]  Transition of Care [facility]) CM/SW Contact:  Zenon Mayo, RN Phone Number: 03/25/2022, 10:09 AM   Clinical Narrative:    Patient is for dc today, she is indep , has no needs. She states her ride is on the way, she has no meds listed.           Patient Goals and CMS Choice        Discharge Placement                       Discharge Plan and Services                                     Social Determinants of Health (SDOH) Interventions     Readmission Risk Interventions     No data to display

## 2022-03-25 NOTE — Discharge Summary (Cosign Needed)
ELECTROPHYSIOLOGY PROCEDURE DISCHARGE SUMMARY    Patient ID: Kari [name],  MRN: [id number], DOB/AGE: 22-Feb-1951 71 y.o.  Admit date: 03/24/2022 Discharge date: 03/25/2022  Primary Care Physician: Shelda Pal, DO  Electrophysiologist: Dr. Lovena Le  Primary Discharge Diagnosis:  PPM lead failure  Secondary Discharge Diagnosis:  none  Allergies  Allergen Reactions   Bactrim [Sulfamethoxazole-Trimethoprim] Hives   Ceftin Itching    Tolerates amoxicillin   Vancomycin Hives   Levofloxacin Other (See Comments)    "spacey," tolerates Cipro     Procedures This Admission:  03/24/22 Conclusion: Successful removal of a previously implanted dual-chamber pacemaker secondary to failure to output due to ventricular pacing noise, followed by successful insertion of a [employer] dual-chamber pacemaker in a patient with complete heart block.  Because the patient's old pacemaker generator was 2 years from ERI, a new dual-chamber pacemaker was inserted as well.  Brief HPI: Kari [name] is a 71 y.o. female was admitted last week with near syncope, pacer evaluation noted lead noise and she was admitted for further evaluation, she had been known to have some RA noise that had been able to be programmed around, though had developed RV noise.  Device was programmed DOO and able to be discharged until device system extraction and new implant could be done. She was admitted here yesterday to undergo her procedure.  Hospital Course:  The patient was admitted and underwent PPM system extraction and new PPM implant yesterday with Dr. Lovena Le, see procedure report for full details.  She was monitored on telemetry overnight which demonstrated AV pacing.  Yesterday evening developed symptomatic hypotension that responded very well to IVF and felt to be 2/2 dehydration and acute blood loss at the time of her procedure.  A stat echo was done with no pericardial effusion.  HS Trop  abnormal, though not unexpected after extraction procedure.  EKGs done as well, and reviewed with Dr. Lovena Le, changes 2/2 new pacing site.   She is much improved this morning.  No CP, no SOB, she has ambulated and her BP improved.  Left chest was without hematoma or ecchymosis.  She has some slight skin irritation from the dressing. L  groin site also stable.  The device was interrogated and found to be functioning normally.  CXR was obtained and demonstrated no pneumothorax status post device implantation.  Wound care, arm mobility, and restrictions were reviewed with the patient.  The patient feels well, denies any CP/SOB, with minimal site discomfort.  She was examined by Dr. Lovena Le and considered stable for discharge to home.   She has been given note for jury duty excuse as well as note, out of work until 04/25/22   Physical Exam: Vitals:   03/24/22 1958 03/25/22 0000 03/25/22 0458 03/25/22 0904  BP: 107/63 99/60 104/60 104/66  Pulse:  (!) 59    Resp: '16 14 16 19  '$ Temp:  98.2 F (36.8 C) 98.3 F (36.8 C) 97.6 F (36.4 C)  TempSrc:  Oral Oral Oral  SpO2:  96%  96%  Weight:   76.4 kg   Height:        GEN- The patient is well appearing, alert and oriented x 3 today.   HEENT: normocephalic, atraumatic; sclera clear, conjunctiva pink; hearing intact; oropharynx clear; neck supple, no JVP Lungs- CTA b/l, normal work of breathing.  No wheezes, rales, rhonchi Heart- RRR, no murmurs, rubs or gallops, PMI not laterally displaced GI- soft, non-tender, non-distended Extremities- no clubbing, cyanosis,  or edema R groin is soft, nontender, no hematoma or bleeding MS- no significant deformity or atrophy Skin- warm and dry, no rash or lesion, left chest without hematoma/ecchymosis Psych- euthymic mood, full affect Neuro- no gross deficits   Labs:   Lab Results  Component Value Date   WBC 11.2 (H) 03/25/2022   HGB 10.8 (L) 03/25/2022   HCT 33.9 (L) 03/25/2022   MCV 97.7 03/25/2022   PLT  173 03/25/2022    Recent Labs  Lab 03/24/22 2206 03/25/22 0316  NA 138 139  K 5.1 4.3  CL 110 111  CO2 21* 21*  BUN 18 17  CREATININE 0.84 0.85  CALCIUM 8.6* 8.5*  PROT 6.1*  --   BILITOT 1.3*  --   ALKPHOS 59  --   ALT 19  --   AST 30  --   GLUCOSE 108* 135*    Discharge Medications:  Allergies as of 03/25/2022       Reactions   Bactrim [sulfamethoxazole-trimethoprim] Hives   Ceftin Itching   Tolerates amoxicillin   Vancomycin Hives   Levofloxacin Other (See Comments)   "spacey," tolerates Cipro        Medication List     TAKE these medications    AIRBORNE GUMMIES PO Take 2 each by mouth daily.   aspirin EC 81 MG tablet Take 81 mg by mouth daily. Notes to patient: Hold for 2 days   diclofenac sodium 1 % Gel Commonly known as: VOLTAREN Apply 1-2 g topically 4 (four) times daily as needed (knee pain.).   famotidine 40 MG tablet Commonly known as: PEPCID Take 40 mg by mouth every morning.   Fish Oil Ultra 1400 MG Caps Take 1,400 mg by mouth daily. Notes to patient: Hold for 2 days   fluticasone 50 MCG/ACT nasal spray Commonly known as: FLONASE Place 1 spray into both nostrils daily as needed for allergies or rhinitis.   melatonin 3 MG Tabs tablet Take 6 mg by mouth at bedtime as needed (sleep.).   multivitamin with minerals Tabs tablet Take 1 tablet by mouth daily.   phenazopyridine 100 MG tablet Commonly known as: Pyridium Take 1 tablet (100 mg total) by mouth 3 (three) times daily as needed for pain.   PROBIOTIC FORMULA PO Take 1 capsule by mouth daily.   rosuvastatin 5 MG tablet Commonly known as: Crestor Take 1 tablet (5 mg total) by mouth daily.   valACYclovir 1000 MG tablet Commonly known as: VALTREX Take 1 tablet (1,000 mg total) by mouth daily as needed.   Vitamin B-12 5000 MCG Subl Place 5,000 mcg under the tongue daily.   Vitamin D3 125 MCG (5000 UT) Caps Take 5,000 Units by mouth daily.        Disposition:  Home Discharge Instructions     Diet - low sodium heart healthy   Complete by: As directed    Increase activity slowly   Complete by: As directed        Follow-up Information     Shelda Pal, DO. Go on 03/28/2022.   Specialty: Family Medicine Why: '@11'$ :15am Contact information: [address] [place] [address] [phone]                 Duration of Discharge Encounter: Greater than 30 minutes including physician time.  Venetia Night, PA-C 03/25/2022 9:56 AM  EP Attending  Patient seen and examined. Agree with the findings as noted above. The patient is stable for  DC home with usual followup.  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 Usual followup.   Carleene Overlie Jash Wahlen,MD

## 2022-03-25 NOTE — Discharge Instructions (Signed)
Post procedure care instructions No driving for 4 days. No lifting over 5 lbs for 1 week. No vigorous or sexual activity for 1 week.  If you notice increased pain, swelling, bleeding or pus, call/return!  You may shower after 24 hours, but no soaking in baths/hot tubs/pools for 1 week.      Supplemental Discharge Instructions for  Pacemaker/Defibrillator Patients  Activity No heavy lifting or vigorous activity with your left/right arm for 6 to 8 weeks.  Do not raise your left/right arm above your head for one week.  Gradually raise your affected arm as drawn below.             03/29/22                       03/30/22                     03/31/22                    04/01/22 __  NO DRIVING for  1 week   ; you may begin driving on  2/0/25 .  WOUND CARE Keep the wound area clean and dry.  Do not get this area wet , no showers for one week; you may shower on  04/01/22   . The tape/steri-strips on your wound will fall off; do not pull them off.  No bandage is needed on the site.  DO  NOT apply any creams, oils, or ointments to the wound area. If you notice any drainage or discharge from the wound, any swelling or bruising at the site, or you develop a fever > 101? F after you are discharged home, call the office at once.  Special Instructions You are still able to use cellular telephones; use the ear opposite the side where you have your pacemaker/defibrillator.  Avoid carrying your cellular phone near your device. When traveling through airports, show security personnel your identification card to avoid being screened in the metal detectors.  Ask the security personnel to use the hand wand. Avoid arc welding equipment, MRI testing (magnetic resonance imaging), TENS units (transcutaneous nerve stimulators).  Call the office for questions about other devices. Avoid electrical appliances that are in poor condition or are not properly grounded. Microwave ovens are safe to be near or to operate.

## 2022-03-25 NOTE — Telephone Encounter (Signed)
Patient ask that the forms be mailed to her. Please advise

## 2022-03-26 ENCOUNTER — Emergency Department (HOSPITAL_BASED_OUTPATIENT_CLINIC_OR_DEPARTMENT_OTHER)
Admission: EM | Admit: 2022-03-26 | Discharge: 2022-03-26 | Disposition: A | Discharge status: Home / Self Care | Payer: 59 | Attending: Emergency Medicine | Admitting: Emergency Medicine

## 2022-03-26 ENCOUNTER — Encounter (HOSPITAL_COMMUNITY): Payer: Self-pay

## 2022-03-26 ENCOUNTER — Ambulatory Visit: Payer: 59

## 2022-03-26 ENCOUNTER — Other Ambulatory Visit: Payer: Self-pay

## 2022-03-26 ENCOUNTER — Emergency Department (HOSPITAL_BASED_OUTPATIENT_CLINIC_OR_DEPARTMENT_OTHER): Payer: 59

## 2022-03-26 ENCOUNTER — Telehealth: Payer: Self-pay

## 2022-03-26 DIAGNOSIS — I82622 Acute embolism and thrombosis of deep veins of left upper extremity: Secondary | ICD-10-CM | POA: Diagnosis not present

## 2022-03-26 DIAGNOSIS — R2232 Localized swelling, mass and lump, left upper limb: Secondary | ICD-10-CM | POA: Diagnosis present

## 2022-03-26 DIAGNOSIS — Z950 Presence of cardiac pacemaker: Secondary | ICD-10-CM | POA: Insufficient documentation

## 2022-03-26 DIAGNOSIS — R55 Syncope and collapse: Secondary | ICD-10-CM | POA: Diagnosis not present

## 2022-03-26 DIAGNOSIS — Z86718 Personal history of other venous thrombosis and embolism: Secondary | ICD-10-CM | POA: Diagnosis not present

## 2022-03-26 DIAGNOSIS — I82612 Acute embolism and thrombosis of superficial veins of left upper extremity: Secondary | ICD-10-CM | POA: Diagnosis not present

## 2022-03-26 LAB — CBC WITH DIFFERENTIAL/PLATELET
Abs Immature Granulocytes: 0.05 10*3/uL (ref 0.00–0.07)
Basophils Absolute: 0 10*3/uL (ref 0.0–0.1)
Basophils Relative: 0 %
Eosinophils Absolute: 0.1 10*3/uL (ref 0.0–0.5)
Eosinophils Relative: 1 %
HCT: 28.3 % — ABNORMAL LOW (ref 36.0–46.0)
Hemoglobin: 9.3 g/dL — ABNORMAL LOW (ref 12.0–15.0)
Immature Granulocytes: 1 %
Lymphocytes Relative: 28 %
Lymphs Abs: 2.6 10*3/uL (ref 0.7–4.0)
MCH: 31.2 pg (ref 26.0–34.0)
MCHC: 32.9 g/dL (ref 30.0–36.0)
MCV: 95 fL (ref 80.0–100.0)
Monocytes Absolute: 0.8 10*3/uL (ref 0.1–1.0)
Monocytes Relative: 9 %
Neutro Abs: 5.6 10*3/uL (ref 1.7–7.7)
Neutrophils Relative %: 61 %
Platelets: 159 10*3/uL (ref 150–400)
RBC: 2.98 MIL/uL — ABNORMAL LOW (ref 3.87–5.11)
RDW: 13.9 % (ref 11.5–15.5)
WBC: 9.1 10*3/uL (ref 4.0–10.5)
nRBC: 0 % (ref 0.0–0.2)

## 2022-03-26 LAB — BASIC METABOLIC PANEL
Anion gap: 6 (ref 5–15)
BUN: 11 mg/dL (ref 8–23)
CO2: 26 mmol/L (ref 22–32)
Calcium: 8.9 mg/dL (ref 8.9–10.3)
Chloride: 105 mmol/L (ref 98–111)
Creatinine, Ser: 0.78 mg/dL (ref 0.44–1.00)
GFR, Estimated: >60 mL/min (ref >60)
Glucose, Bld: 107 mg/dL — ABNORMAL HIGH (ref 70–99)
Potassium: 3.4 mmol/L — ABNORMAL LOW (ref 3.5–5.1)
Sodium: 137 mmol/L (ref 135–145)

## 2022-03-26 MED ORDER — HEPARIN (PORCINE) 25000 UT/250ML-% IV SOLN
1100.0000 [IU]/h | INTRAVENOUS | Status: DC
  Filled 2022-03-26: qty 250

## 2022-03-26 MED ORDER — APIXABAN (ELIQUIS) VTE STARTER PACK (10MG AND 5MG)
ORAL_TABLET | ORAL | 0 refills | Status: DC
  Filled 2022-03-27: qty 74, 30d supply, fill #0

## 2022-03-26 MED ORDER — OXYCODONE HCL 5 MG PO TABS: 5.0000 mg | ORAL_TABLET | Freq: Four times a day (QID) | ORAL | 0 refills | Status: DC | PRN

## 2022-03-26 MED ORDER — ACETAMINOPHEN 500 MG PO TABS
1000.0000 mg | ORAL_TABLET | Freq: Once | ORAL | Status: AC
  Administered 2022-03-26: 1000 mg via ORAL
  Filled 2022-03-26: qty 2

## 2022-03-26 MED ORDER — HEPARIN BOLUS VIA INFUSION: 4000.0000 [IU] | Freq: Once | INTRAVENOUS | Status: DC

## 2022-03-26 MED ORDER — OXYCODONE HCL 5 MG PO TABS: 5.0000 mg | ORAL_TABLET | Freq: Once | ORAL | Status: DC

## 2022-03-26 NOTE — Telephone Encounter (Signed)
Spoke with the patient and advised her that we have put a copy of her FMLA paperwork in the mail.

## 2022-03-26 NOTE — Discharge Instructions (Signed)
As discussed, recommend 1000 mg of Tylenol every 6 hours as needed for pain.  Recommend taking narcotic pain medicine as needed.  Do not drive or do any dangerous activities while taking this medicine.  Per Dr. Lovena Le recommendation, do not start Eliquis until Saturday, August 5.  Make sure you have your appointment with them on Monday for follow-up.  I have also provided you the number to follow-up with vascular surgery with Dr. Unk Lightning.  If you feel like you are having any worsening swelling, symptoms especially shortness of breath as we discussed please return for evaluation.

## 2022-03-26 NOTE — Telephone Encounter (Signed)
Spoke with the patient who states that after her procedure on 7/31 her left arm has remained extremely swollen. She states that it is very uncomfortable but denies any tenderness or pain. Will send to device clinic to determine if patient needs to be checked out.

## 2022-03-26 NOTE — ED Triage Notes (Signed)
Pt reports Pacemaker and leads replaced Monday, left arm swelling extensive JVD noted as well on left side

## 2022-03-26 NOTE — Telephone Encounter (Signed)
Called patient back who states that she is at the [facility] in [place] to be evaluated. Patient is disappointed because she called the on-call service last night and no one called her back. I apologized to the patient that she did not receive a call back. She states that she could not wait until 3:30pm today to come here to be seen because this is too urgent of a matter. Once again I apologized to the patient.

## 2022-03-26 NOTE — Telephone Encounter (Signed)
Pt called to speak with Percival Spanish, RN. She did not inform why.

## 2022-03-26 NOTE — ED Provider Notes (Signed)
[name] EMERGENCY DEPARTMENT Provider Note   CSN: [id number] Arrival date & time: 03/26/22  1449     History  Chief Complaint  Patient presents with   Arm Swelling    [name] is a 71 y.o. female.  Patient here with swelling to the left upper extremity.  She is here after having pacemaker leads replaced a few days ago.  Was discharged from the hospital yesterday.  Denies any fevers or chills.  History of remote DVT.  She denies any weakness, numbness, chills, chest pain, shortness of breath.  Nothing makes it worse or better.  Tried to get in with her cardiologist but was unable to.  While she was hospitalized she had IVs in both upper arms.  She had an arterial line in the right upper extremity.  Pain and swelling seems to be around the area where her left arm IV was.  The history is provided by the patient.       Home Medications Prior to Admission medications   Medication Sig Start Date End Date Taking? Authorizing Provider  APIXABAN (ELIQUIS) VTE STARTER PACK ('10MG'$  AND '5MG'$ ) Take as directed on package: start with two-'5mg'$  tablets twice daily for 7 days. On day 8, switch to one-'5mg'$  tablet twice daily. 03/29/22  Yes Japheth Diekman, DO  oxyCODONE (ROXICODONE) 5 MG immediate release tablet Take 1 tablet (5 mg total) by mouth every 6 (six) hours as needed for up to 20 doses for breakthrough pain. 03/26/22  Yes Nicolaus Andel, DO  Cholecalciferol (VITAMIN D3) 125 MCG (5000 UT) CAPS Take 5,000 Units by mouth daily.    [provider]  Cyanocobalamin (VITAMIN B-12) 5000 MCG SUBL Place 5,000 mcg under the tongue daily.    [provider]  diclofenac sodium (VOLTAREN) 1 % GEL Apply 1-2 g topically 4 (four) times daily as needed (knee pain.).    [provider]  famotidine (PEPCID) 40 MG tablet Take 40 mg by mouth every morning.    [provider]  fluticasone (FLONASE) 50 MCG/ACT nasal spray Place 1 spray into both nostrils daily as  needed for allergies or rhinitis.    [provider]  Melatonin 3 MG TABS Take 6 mg by mouth at bedtime as needed (sleep.).    [provider]  Multiple Vitamin (MULTIVITAMIN WITH MINERALS) TABS Take 1 tablet by mouth daily.    [provider]  Multiple Vitamins-Minerals (AIRBORNE GUMMIES PO) Take 2 each by mouth daily.    [provider]  Omega-3 Fatty Acids (FISH OIL ULTRA) 1400 MG CAPS Take 1,400 mg by mouth daily.    [provider]  phenazopyridine (PYRIDIUM) 100 MG tablet Take 1 tablet (100 mg total) by mouth 3 (three) times daily as needed for pain. Patient not taking: Reported on 03/21/2022 01/07/22   Brunetta Jeans, PA-C  Probiotic Product (PROBIOTIC FORMULA PO) Take 1 capsule by mouth daily.    [provider]  rosuvastatin (CRESTOR) 5 MG tablet Take 1 tablet (5 mg total) by mouth daily. 03/10/22   Shelda Pal, DO  valACYclovir (VALTREX) 1000 MG tablet Take 1 tablet (1,000 mg total) by mouth daily as needed. 08/02/21   Shelda Pal, DO      Allergies    Bactrim [sulfamethoxazole-trimethoprim], Ceftin, Vancomycin, and Levofloxacin    Review of Systems   Review of Systems  Physical Exam Updated Vital Signs BP (!) 140/73   Pulse 74   Temp 98.3 F (36.8 C) (Oral)  Resp 16   Ht '5\' 8"'$  (1.727 m)   Wt 64.9 kg   SpO2 98%   BMI 21.74 kg/m  Physical Exam Vitals and nursing note reviewed.  Constitutional:      General: She is not in acute distress.    Appearance: She is well-developed.  HENT:     Head: Normocephalic and atraumatic.  Eyes:     Extraocular Movements: Extraocular movements intact.     Conjunctiva/sclera: Conjunctivae normal.     Pupils: Pupils are equal, round, and reactive to light.  Cardiovascular:     Rate and Rhythm: Normal rate and regular rhythm.     Heart sounds: No murmur heard. Pulmonary:     Effort: Pulmonary effort is normal. No respiratory distress.     Breath sounds:  Normal breath sounds.  Abdominal:     Palpations: Abdomen is soft.     Tenderness: There is no abdominal tenderness.  Musculoskeletal:        General: Swelling and tenderness present.     Cervical back: Neck supple.     Comments: Tenderness and swelling to the left upper extremity from the elbow up into the shoulder, there is no erythema or warmth  Skin:    General: Skin is warm and dry.     Capillary Refill: Capillary refill takes less than 2 seconds.     Comments: Surgical site on the chest is clean, dry, intact, there is no surrounding erythema or purulent drainage  Neurological:     General: No focal deficit present.     Mental Status: She is alert.     Sensory: No sensory deficit.     Motor: No weakness.  Psychiatric:        Mood and Affect: Mood normal.     ED Results / Procedures / Treatments   Labs (all labs ordered are listed, but only abnormal results are displayed) Labs Reviewed  CBC WITH DIFFERENTIAL/PLATELET - Abnormal; Notable for the following components:      Result Value   RBC 2.98 (*)    Hemoglobin 9.3 (*)    HCT 28.3 (*)    All other components within normal limits  BASIC METABOLIC PANEL - Abnormal; Notable for the following components:   Potassium 3.4 (*)    Glucose, Bld 107 (*)    All other components within normal limits    EKG EKG Interpretation  Date/Time:  Wednesday March 26 2022 15:18:20 EDT Ventricular Rate:  80 PR Interval:  217 QRS Duration: 119 QT Interval:  472 QTC Calculation: 545 R Axis:   46 Text Interpretation: Atrial-sensed ventricular-paced rhythm No further analysis attempted due to paced rhythm Confirmed by Lennice Sites ([id number]) on 03/26/2022 3:21:44 PM  Radiology US Venous Img Upper Left (DVT Study)  Result Date: 03/26/2022 CLINICAL DATA:  Recent pacemaker EXAM: LEFT UPPER EXTREMITY VENOUS DOPPLER ULTRASOUND TECHNIQUE: Gray-scale sonography with graded compression, as well as color Doppler and duplex ultrasound were performed  to evaluate the upper extremity deep venous system from the level of the subclavian vein and including the jugular, axillary, basilic, radial, ulnar and upper cephalic vein. Spectral Doppler was utilized to evaluate flow at rest and with distal augmentation maneuvers. COMPARISON:  None Available. FINDINGS: Contralateral Subclavian Vein: Respiratory phasicity is normal and symmetric with the symptomatic side. No evidence of thrombus. Normal compressibility. Internal Jugular Vein: No evidence of thrombus. Normal compressibility, respiratory phasicity and response to augmentation. Subclavian Vein: Occlusive thrombus. Axillary Vein: Occlusive thrombus. Cephalic Vein: Occlusive thrombus. Basilic Vein:  Occlusive thrombus. Brachial Veins: No evidence of thrombus. Normal compressibility, respiratory phasicity and response to augmentation. Radial Veins: No evidence of thrombus. Normal compressibility, respiratory phasicity and response to augmentation. Ulnar Veins: No evidence of thrombus. Normal compressibility, respiratory phasicity and response to augmentation. Venous Reflux:  None visualized. Other Findings:  None visualized. IMPRESSION: Positive examination for deep venous thrombosis in the left upper extremity with occlusive thrombus in the left subclavian and axillary veins. Additional superficial thrombosis of the cephalic and basilic veins. These results will be called to the ordering clinician or representative by the Radiologist Assistant, and communication documented in the PACS or [employer]. Electronically Signed   By: Delanna Ahmadi M.D.   On: 03/26/2022 16:39   DG Chest 2 View  Result Date: 03/25/2022 CLINICAL DATA:  Pacemaker. EXAM: CHEST - 2 VIEW COMPARISON:  Chest x-ray dated April 11, 2019. FINDINGS: Interval pacemaker revision with new pacemaker generator and right atrial and right ventricular leads. The leads are more proximal in location compared to the prior leads. The heart size and  mediastinal contours are within normal limits. Normal pulmonary vascularity. Mild linear atelectasis in the medial left lower lobe. No focal consolidation, pleural effusion, or pneumothorax. No acute osseous abnormality. IMPRESSION: 1. Interval pacemaker revision. Right atrial and ventricular leads are more proximal in location compared to the prior leads. Correlate with procedure report and pacemaker function. 2. No acute cardiopulmonary disease. Electronically Signed   By: Titus Dubin M.D.   On: 03/25/2022 09:01   ECHOCARDIOGRAM COMPLETE  Result Date: 03/24/2022    ECHOCARDIOGRAM REPORT   Patient Name:   BAYYINAH DUKEMAN Date of Exam: 03/24/2022 Medical Rec #:  [id number]           Height:       68.0 in Accession #:    [id number]          Weight:       167.1 lb Date of Birth:  [birth year]            BSA:          1.893 m Patient Age:    53 years            BP:           107/63 mmHg Patient Gender: F                   HR:           60 bpm. Exam Location:  Inpatient Procedure: 2D Echo, Cardiac Doppler and Color Doppler                        STAT ECHO Reported to: Dr Carlyle Dolly on 03/24/2022 8:20:00 PM. Indications:    Abnormal ECG  History:        Patient has prior history of Echocardiogram examinations. GERD.                 S/P lead extraction 03/24/22.  Sonographer:    Clayton Lefort RDCS (AE) Referring Phys: [place] Comments: Limited range of motion for left arm, limiting imaging windows. IMPRESSIONS  1. Left ventricular ejection fraction, by estimation, is 65 to 70%. The left ventricle has normal function. The left ventricle has no regional wall motion abnormalities. There is mild left ventricular hypertrophy. Left ventricular diastolic parameters are indeterminate.  2. Right ventricular systolic function is normal. The right ventricular size is normal.  3. The mitral valve  is normal in structure. No evidence of mitral valve regurgitation. No evidence of mitral stenosis.  4. The  aortic valve has an indeterminant number of cusps. Aortic valve regurgitation is not visualized. No aortic stenosis is present.  5. IVC is small suggesting low RA pressure and hypovolemia. FINDINGS  Left Ventricle: Left ventricular ejection fraction, by estimation, is 65 to 70%. The left ventricle has normal function. The left ventricle has no regional wall motion abnormalities. The left ventricular internal cavity size was normal in size. There is  mild left ventricular hypertrophy. Left ventricular diastolic parameters are indeterminate. Right Ventricle: The right ventricular size is normal. Right vetricular wall thickness was not well visualized. Right ventricular systolic function is normal. Left Atrium: Left atrial size was normal in size. Right Atrium: Right atrial size was normal in size. Pericardium: There is no evidence of pericardial effusion. Mitral Valve: The mitral valve is normal in structure. No evidence of mitral valve regurgitation. No evidence of mitral valve stenosis. Tricuspid Valve: The tricuspid valve is normal in structure. Tricuspid valve regurgitation is not demonstrated. No evidence of tricuspid stenosis. Aortic Valve: The aortic valve has an indeterminant number of cusps. Aortic valve regurgitation is not visualized. No aortic stenosis is present. Aortic valve mean gradient measures 3.2 mmHg. Aortic valve peak gradient measures 6.7 mmHg. Aortic valve area, by VTI measures 2.68 cm. Pulmonic Valve: The pulmonic valve was not well visualized. Pulmonic valve regurgitation is not visualized. No evidence of pulmonic stenosis. Aorta: The aortic root is normal in size and structure. Venous: IVC is small suggesting low RA pressure and hypovolemia. IAS/Shunts: The interatrial septum was not well visualized.  LEFT VENTRICLE PLAX 2D LVIDd:         3.80 cm   Diastology LV PW:         1.10 cm   LV e' medial:    6.42 cm/s LV IVS:        1.20 cm   LV E/e' medial:  12.0 LVOT diam:     1.90 cm   LV e'  lateral:   6.64 cm/s LV SV:         63        LV E/e' lateral: 11.6 LV SV Index:   33 LVOT Area:     2.84 cm  RIGHT VENTRICLE             IVC RV Basal diam:  2.70 cm     IVC diam: 1.10 cm RV S prime:     16.00 cm/s TAPSE (M-mode): 3.0 cm LEFT ATRIUM           Index        RIGHT ATRIUM          Index LA Vol (A2C): 27.5 ml 14.52 ml/m  RA Area:     9.86 cm LA Vol (A4C): 35.5 ml 18.75 ml/m  RA Volume:   20.40 ml 10.77 ml/m  AORTIC VALVE AV Area (Vmax):    2.56 cm AV Area (Vmean):   2.52 cm AV Area (VTI):     2.68 cm AV Vmax:           129.78 cm/s AV Vmean:          81.178 cm/s AV VTI:            0.235 m AV Peak Grad:      6.7 mmHg AV Mean Grad:      3.2 mmHg LVOT Vmax:  117.00 cm/s LVOT Vmean:        72.100 cm/s LVOT VTI:          0.222 m LVOT/AV VTI ratio: 0.94  AORTA Ao Root diam: 3.70 cm MITRAL VALVE MV Area (PHT): 2.37 cm    SHUNTS MV Decel Time: 320 msec    Systemic VTI:  0.22 m MV E velocity: 76.80 cm/s  Systemic Diam: 1.90 cm MV A velocity: 46.90 cm/s MV E/A ratio:  1.64 Carlyle Dolly MD Electronically signed by Carlyle Dolly MD Signature Date/Time: 03/24/2022/8:25:35 PM    Final     Procedures Procedures    Medications Ordered in ED Medications  oxyCODONE (Oxy IR/ROXICODONE) immediate release tablet 5 mg (has no administration in time range)  acetaminophen (TYLENOL) tablet 1,000 mg (1,000 mg Oral Given 03/26/22 1524)    ED Course/ Medical Decision Making/ A&P                           Medical Decision Making Amount and/or Complexity of Data Reviewed Labs: ordered.  Risk OTC drugs. Prescription drug management.   Kari [name] is a 71 year old female who presents with left upper arm swelling.  History of heart block status post pacemaker.  History of DVT after knee surgery several decades ago when she was 63.  Not on blood thinners now.  Unremarkable vitals.  No fever.  She was discharged home yesterday after getting her pacemaker replaced due to lead failure.  She  had some postop lightheadedness and hypotension.  She had a echocardiogram that was unremarkable.  No effusion.  She had unremarkable chest x-ray.  She is has some progressive swelling to the left upper arm above the elbow where she had a peripheral IV.  She had an art line but in her right upper extremity.  She does not have any respiratory symptoms.  Surgical site is clean dry and intact.  There is no evidence of cellulitis in this area.  Differential is likely superficial thrombophlebitis versus DVT.  We will check basic labs and get a DVT study and upper extremity.  She has good pulses in the upper extremities and doubt any arterial process.  Per my review and interpretation of labs, hemoglobin is 9.3.  Otherwise lab works unremarkable.  DVT study does show occlusive thrombus in the left subclavian vein, axillary vein, cephalic vein, basilic vein.  Brachial vein, radial vein, ulnar vein are patent.  There is no thrombus within the internal jugular vein.  No thrombus in the contralateral subclavian vein.  I talked with Dr. Lovena Le with cardiology who performed the procedure and ultimately after discussing risk benefits with him he really thinks that patient needs to wait until Saturday, about the 5/6 postop before starting any anticoagulation due to her high risk of bleeding and developing hematoma and infection around her pacemaker.  I talked with Dr. Unk Lightning with vascular surgery who recommended IV heparin but understands that her recent procedure does complicate things and ultimately he recommended that we follow the treatment plan of cardiology team given her very high bleeding risk.  Dr. Lovena Le recommended that we start patient on Eliquis on Saturday and have her follow-up on Monday morning with his team.  I had a long discussion with patient about this and ultimately shared decision was made to hold anticoagulation until Saturday.  Recommend Tylenol and will prescribe oxycodone for pain.  Neurovascularly  she is intact right now.  She understands return precautions including worsening  swelling, shortness of breath, chest pain.  Patient discharged in good condition.  This chart was dictated using voice recognition software.  Despite best efforts to proofread,  errors can occur which can change the documentation meaning.   This chart was dictated using voice recognition software.  Despite best efforts to proofread,  errors can occur which can change the documentation meaning.         Final Clinical Impression(s) / ED Diagnoses Final diagnoses:  Acute deep vein thrombosis (DVT) of left upper extremity, unspecified vein ([place])    Rx / DC Orders ED Discharge Orders          Ordered    APIXABAN (ELIQUIS) VTE STARTER PACK ('10MG'$  AND '5MG'$ )        03/26/22 1831    oxyCODONE (ROXICODONE) 5 MG immediate release tablet  Every 6 hours PRN        03/26/22 1831              Lennice Sites, DO 03/26/22 1836

## 2022-03-26 NOTE — Telephone Encounter (Signed)
Apt scheduled with device clinic today at 3:30 to assess arm.

## 2022-03-26 NOTE — Telephone Encounter (Signed)
Patient cancel appt and is headed to the hospital. Please advise

## 2022-03-26 NOTE — ED Notes (Signed)
Pt recently dc'd from hospital from pacemaker replacement Pt returns to ED because of left arm swelling fingers to shoulder, +pulses  Pt can not lift above head due to pain. VSS EDP at bedside

## 2022-03-26 NOTE — Progress Notes (Deleted)
ANTICOAGULATION CONSULT NOTE - Initial Consult  Pharmacy Consult for heparin Indication: DVT  Allergies  Allergen Reactions   Bactrim [Sulfamethoxazole-Trimethoprim] Hives   Ceftin Itching    Tolerates amoxicillin   Vancomycin Hives   Levofloxacin Other (See Comments)    "spacey," tolerates Cipro    Patient Measurements: Height: '5\' 8"'$  (172.7 cm) Weight: 64.9 kg (143 lb) IBW/kg (Calculated) : 63.9 Heparin Dosing Weight: TBW  Vital Signs: Temp: 98.3 F (36.8 C) (08/02 1454) Temp Source: Oral (08/02 1454) BP: 126/69 (08/02 1700) Pulse Rate: 80 (08/02 1700)  Labs: Recent Labs    03/24/22 2206 03/25/22 0316 03/26/22 1509  HGB 12.8 10.8* 9.3*  HCT 39.3 33.9* 28.3*  PLT 187 173 159  CREATININE 0.84 0.85 0.78  TROPONINIHS 1,341*  --   --     Estimated Creatinine Clearance: 67 mL/min (by C-G formula based on SCr of 0.78 mg/dL).   Medical History: Past Medical History:  Diagnosis Date   Arthritis    Chronic back pain    Diverticulitis    DVT (deep venous thrombosis) [facility]) age 41   s/p  knee arthroscopy    Herpes zoster conjunctivitis    Menopause    Mobitz type 2 second degree AV block    2:1/notes 02/16/2013   PONV (postoperative nausea and vomiting)    Presence of permanent cardiac pacemaker    Seasonal allergies    "spring & fall; not q year" (02/17/2013)   Shoulder impingement    and left rotator cuff tear    Assessment: 71 YOF presenting with left arm swelling s/p discharge from hospital for pacemaker replacement, [place] positive for DVT.  She is not on anticoagulation PTA  Goal of Therapy:  Heparin level 0.3-0.7 units/ml Monitor platelets by anticoagulation protocol: Yes   Plan:  Heparin 4000 units IV x 1, and gtt at 1100 units/hr F/u 6 hour heparin level F/u long term [facility] plan  Bertis Ruddy, PharmD Clinical Pharmacist ED Pharmacist Phone # [phone] 03/26/2022 6:00 PM

## 2022-03-27 ENCOUNTER — Other Ambulatory Visit (HOSPITAL_BASED_OUTPATIENT_CLINIC_OR_DEPARTMENT_OTHER): Payer: Self-pay

## 2022-03-27 ENCOUNTER — Ambulatory Visit (HOSPITAL_BASED_OUTPATIENT_CLINIC_OR_DEPARTMENT_OTHER): Payer: 59 | Admitting: Orthopaedic Surgery

## 2022-03-28 ENCOUNTER — Inpatient Hospital Stay: Payer: 59 | Admitting: Family Medicine

## 2022-03-28 ENCOUNTER — Other Ambulatory Visit (HOSPITAL_BASED_OUTPATIENT_CLINIC_OR_DEPARTMENT_OTHER): Payer: Self-pay

## 2022-03-28 LAB — TYPE AND SCREEN
ABO/RH(D): A POS
Antibody Screen: NEGATIVE
Unit division: 0
Unit division: 0
Unit division: 0
Unit division: 0

## 2022-03-28 LAB — BPAM RBC
Blood Product Expiration Date: 202308162359
Blood Product Expiration Date: 202308202359
Blood Product Expiration Date: 202308212359
Blood Product Expiration Date: 202308232359
ISSUE DATE / TIME: 202307311601
ISSUE DATE / TIME: 202307311601
Unit Type and Rh: 6200
Unit Type and Rh: 6200
Unit Type and Rh: 6200
Unit Type and Rh: 6200

## 2022-03-30 NOTE — Progress Notes (Deleted)
Cardiology Office Note Date:  03/30/2022  Patient ID:  K[place], Niederer 1950/10/23, MRN [id number] PCP:  Shelda Pal, DO  Electrophysiologist: Dr. Lovena Le  ***refresh   Chief Complaint: *** DVT  History of Present Illness: Kari [name] is a 71 y.o. female with history of CHB w/PPM  She was admitted 03/20/22 with near syncope, pacer evaluation noted lead noise and she was admitted for further evaluation, she had been known to have some RA noise that had been able to be programmed around, though had developed RV noise.  Device was programmed DOO and able to be discharged until device system extraction and new implant could be done. She was admitted here yesterday to undergo her procedure.  She was admitted 03/24/22 to undergo PPM system extraction and new implant.  The night of her procedure she developed marked hypotension felt to be 2/2 hypovolemia with acute blood loss and dehydration, resolved quickly with IVF.  POD #1 she felt much improved and was discharged  03/26/22 she reached out with persistent/worsening LUE swelling  that was painful/uncomfortable She went to [facility] and found with acute DVT of LUE Dr. Lovena Le made aware and she was started on Eliquis and given pain meds  *** Leave dressing to her next  wound check move up to next week *** restrictions *** pocket ok? *** eliquis '10mg'$  BID x 7 days > '5mg'$   Device information BSCi dual chamber PPM implanted 03/24/22 (Previous pacing system was extracted 03/24/22)   Past Medical History:  Diagnosis Date   Arthritis    Chronic back pain    Diverticulitis    DVT (deep venous thrombosis) [facility]) age 54   s/p  knee arthroscopy    Herpes zoster conjunctivitis    Menopause    Mobitz type 2 second degree AV block    2:1/notes 02/16/2013   PONV (postoperative nausea and vomiting)    Presence of permanent cardiac pacemaker    Seasonal allergies    "spring & fall; not q year" (02/17/2013)   Shoulder impingement     and left rotator cuff tear    Past Surgical History:  Procedure Laterality Date   ACHILLES TENDON SURGERY     ARTHROGRAM KNEE Right 1975   CHOLECYSTECTOMY  1990's   colonscopy     ESOPHAGOGASTRODUODENOSCOPY     EYE SURGERY     muscle release right eye   INSERT / REPLACE / REMOVE PACEMAKER  02/17/2013   [employer] Advantio dual-chamber pacemaker, model [id number],   KNEE ARTHROSCOPY Right 1982, 1983, 1995, 1996   KNEE ARTHROSCOPY W/ ACL RECONSTRUCTION Left 1984   LAPAROSCOPIC SIGMOID COLECTOMY N/A 01/01/2015   Procedure: LAPAROSCOPIC SIGMOID COLECTOMY;  Surgeon: Stark Klein, MD;  Location: [place];  Service: General;  Laterality: N/A;   LEAD EXTRACTION N/A 03/24/2022   Procedure: LEAD EXTRACTION;  Surgeon: Evans Lance, MD;  Location: [place] [facility];  Service: Cardiovascular;  Laterality: N/A;   PACEMAKER IMPLANT N/A 03/24/2022   Procedure: PACEMAKER IMPLANT;  Surgeon: Evans Lance, MD;  Location: [place] [facility];  Service: Cardiovascular;  Laterality: N/A;   PERMANENT PACEMAKER INSERTION N/A 02/17/2013   Procedure: PERMANENT PACEMAKER INSERTION;  Surgeon: Evans Lance, MD;  Location: [facility];  Service: Cardiovascular;  Laterality: N/A;   SHOULDER ARTHROSCOPY Left 09/05/2016   Procedure: LEFT SHOULDER ARTHROSCOPY , ACROMIOPLASTY AND ROTATOR CUFF REPAIR;  Surgeon: Melrose Nakayama, MD;  Location: [place];  Service: Orthopedics;  Laterality: Left;   TONSILLECTOMY  1961   TOTAL KNEE ARTHROPLASTY Right 2000; 06/2000   "replaced w/appropriate hardware" (02/17/2013)   TOTAL KNEE REVISION Right 04/13/2019   Procedure: Revision Right Knee Arthroplasty;  Surgeon: Frederik Pear, MD;  Location: [facility];  Service: Orthopedics;  Laterality: Right;   TRIGGER FINGER RELEASE Right ~ 2012   "thumb" (02/17/2013)   VAGINAL HYSTERECTOMY  ~ 1994   partial     Current Outpatient Medications  Medication Sig Dispense Refill   APIXABAN (ELIQUIS) VTE STARTER PACK ('10MG'$  AND '5MG'$ ) Take as directed  on package: start with two-'5mg'$  tablets twice daily for 7 days. On day 8, switch to one-'5mg'$  tablet twice daily. 74 each 0   Cholecalciferol (VITAMIN D3) 125 MCG (5000 UT) CAPS Take 5,000 Units by mouth daily.     Cyanocobalamin (VITAMIN B-12) 5000 MCG SUBL Place 5,000 mcg under the tongue daily.     diclofenac sodium (VOLTAREN) 1 % GEL Apply 1-2 g topically 4 (four) times daily as needed (knee pain.).     famotidine (PEPCID) 40 MG tablet Take 40 mg by mouth every morning.     fluticasone (FLONASE) 50 MCG/ACT nasal spray Place 1 spray into both nostrils daily as needed for allergies or rhinitis.     Melatonin 3 MG TABS Take 6 mg by mouth at bedtime as needed (sleep.).     Multiple Vitamin (MULTIVITAMIN WITH MINERALS) TABS Take 1 tablet by mouth daily.     Multiple Vitamins-Minerals (AIRBORNE GUMMIES PO) Take 2 each by mouth daily.     Omega-3 Fatty Acids (FISH OIL ULTRA) 1400 MG CAPS Take 1,400 mg by mouth daily.     oxyCODONE (ROXICODONE) 5 MG immediate release tablet Take 1 tablet (5 mg total) by mouth every 6 (six) hours as needed for up to 20 doses for breakthrough pain. 20 tablet 0   phenazopyridine (PYRIDIUM) 100 MG tablet Take 1 tablet (100 mg total) by mouth 3 (three) times daily as needed for pain. (Patient not taking: Reported on 03/21/2022) 10 tablet 0   Probiotic Product (PROBIOTIC FORMULA PO) Take 1 capsule by mouth daily.     rosuvastatin (CRESTOR) 5 MG tablet Take 1 tablet (5 mg total) by mouth daily. 30 tablet 3   valACYclovir (VALTREX) 1000 MG tablet Take 1 tablet (1,000 mg total) by mouth daily as needed. 90 tablet 2   No current facility-administered medications for this visit.    Allergies:   Bactrim [sulfamethoxazole-trimethoprim], Ceftin, Vancomycin, and Levofloxacin   Social History:  The patient  reports that she has never smoked. She has never used smokeless tobacco. She reports current alcohol use. She reports that she does not use drugs.   Family History:  The patient's  family history includes Breast cancer in her paternal aunt; COPD in her father and mother; Cancer in her father; Diabetes in her brother; Hyperlipidemia in her mother; Hypertension in her mother; Lung cancer in her father; Macular degeneration in her mother.  ROS:  Please see the history of present illness.    All other systems are reviewed and otherwise negative.   PHYSICAL EXAM:  VS:  There were no vitals taken for this visit. BMI: There is no height or weight on file to calculate BMI. Well nourished, well developed, in no acute distress HEENT: normocephalic, atraumatic Neck: no JVD, carotid bruits or masses Cardiac:  *** RRR; no significant murmurs, no rubs, or gallops Lungs:  *** CTA b/l, no wheezing, rhonchi or rales Abd: soft, nontender MS: no deformity or *** atrophy Ext: ***  no edema LE, *** LEU Skin: warm and dry, no rash Neuro:  No gross deficits appreciated Psych: euthymic mood, full affect  *** PPM site is stable, no tethering or discomfort   EKG:  Done today and reviewed by myself shows  ***  Device interrogation done today and reviewed by myself:  ***  03/26/22: LUE venous US IMPRESSION: Positive examination for deep venous thrombosis in the left upper extremity with occlusive thrombus in the left subclavian and axillary veins. Additional superficial thrombosis of the cephalic and basilic veins.   03/24/2022: TTE  1. Left ventricular ejection fraction, by estimation, is 65 to 70%. The  left ventricle has normal function. The left ventricle has no regional  wall motion abnormalities. There is mild left ventricular hypertrophy.  Left ventricular diastolic parameters  are indeterminate.   2. Right ventricular systolic function is normal. The right ventricular  size is normal.   3. The mitral valve is normal in structure. No evidence of mitral valve  regurgitation. No evidence of mitral stenosis.   4. The aortic valve has an indeterminant number of cusps. Aortic  valve  regurgitation is not visualized. No aortic stenosis is present.   5. IVC is small suggesting low RA pressure and hypovolemia.     04/27/2013 stress myoview Impression Exercise Capacity:  Lexiscan with low level exercise. BP Response:  Normal blood pressure response. Clinical Symptoms:  No significant symptoms noted. ECG Impression:  No significant ST segment change suggestive of ischemia. Comparison with Prior Nuclear Study: No previous nuclear study performed   Overall Impression:  Low risk stress nuclear study .  there is a small fixed defect of the distal anterior wall that may be due to breast artifact.    the distal anterior wall contracts normally. .   LV Ejection Fraction: 65%.  LV Wall Motion:  NL LV Function; NL Wall Motion.  Recent Labs: 03/07/2022: TSH 1.82 03/20/2022: Magnesium 1.9 03/24/2022: ALT 19 03/26/2022: BUN 11; Creatinine, Ser 0.78; Hemoglobin 9.3; Platelets 159; Potassium 3.4; Sodium 137  03/07/2022: Cholesterol 186; HDL 56; LDL Cholesterol (Calc) 104; Total CHOL/HDL Ratio 3.3; Triglycerides 147   Estimated Creatinine Clearance: 67 mL/min (by C-G formula based on SCr of 0.78 mg/dL).   Wt Readings from Last 3 Encounters:  03/26/22 143 lb (64.9 kg)  03/25/22 168 lb 6.4 oz (76.4 kg)  03/20/22 150 lb (68 kg)     Other studies reviewed: Additional studies/records reviewed today include: summarized above  ASSESSMENT AND PLAN:  PPM Old pacing system extracted with neew implant 03/24/22 ***  DVT (LUE) Acute post extraction procedure Started on eliquis DVT started pack ***  Disposition: F/u with ***  Current medicines are reviewed at length with the patient today.  The patient did not have any concerns regarding medicines.  Venetia Night, PA-C 03/30/2022 9:18 AM     [facility] [address] [place] [address] [phone] (office)  [phone] (fax)

## 2022-04-01 ENCOUNTER — Other Ambulatory Visit: Payer: Self-pay

## 2022-04-01 ENCOUNTER — Ambulatory Visit: Payer: 59 | Admitting: Physician Assistant

## 2022-04-01 ENCOUNTER — Emergency Department (HOSPITAL_COMMUNITY): Payer: 59

## 2022-04-01 ENCOUNTER — Emergency Department (HOSPITAL_BASED_OUTPATIENT_CLINIC_OR_DEPARTMENT_OTHER): Payer: 59

## 2022-04-01 ENCOUNTER — Observation Stay (HOSPITAL_COMMUNITY)
Admission: EM | Admit: 2022-04-01 | Discharge: 2022-04-02 | Disposition: A | Discharge status: Home / Self Care | Payer: 59 | Attending: Internal Medicine | Admitting: Internal Medicine

## 2022-04-01 DIAGNOSIS — I700 Atherosclerosis of aorta: Secondary | ICD-10-CM | POA: Diagnosis not present

## 2022-04-01 DIAGNOSIS — Z86718 Personal history of other venous thrombosis and embolism: Secondary | ICD-10-CM | POA: Insufficient documentation

## 2022-04-01 DIAGNOSIS — R918 Other nonspecific abnormal finding of lung field: Secondary | ICD-10-CM | POA: Diagnosis not present

## 2022-04-01 DIAGNOSIS — Z79899 Other long term (current) drug therapy: Secondary | ICD-10-CM | POA: Insufficient documentation

## 2022-04-01 DIAGNOSIS — I2699 Other pulmonary embolism without acute cor pulmonale: Secondary | ICD-10-CM | POA: Diagnosis not present

## 2022-04-01 DIAGNOSIS — R61 Generalized hyperhidrosis: Secondary | ICD-10-CM | POA: Diagnosis not present

## 2022-04-01 DIAGNOSIS — K219 Gastro-esophageal reflux disease without esophagitis: Secondary | ICD-10-CM | POA: Diagnosis not present

## 2022-04-01 DIAGNOSIS — R457 State of emotional shock and stress, unspecified: Secondary | ICD-10-CM | POA: Diagnosis not present

## 2022-04-01 DIAGNOSIS — Z45018 Encounter for adjustment and management of other part of cardiac pacemaker: Secondary | ICD-10-CM | POA: Diagnosis not present

## 2022-04-01 DIAGNOSIS — R55 Syncope and collapse: Secondary | ICD-10-CM | POA: Diagnosis not present

## 2022-04-01 DIAGNOSIS — I82409 Acute embolism and thrombosis of unspecified deep veins of unspecified lower extremity: Secondary | ICD-10-CM

## 2022-04-01 DIAGNOSIS — Z7901 Long term (current) use of anticoagulants: Secondary | ICD-10-CM | POA: Diagnosis not present

## 2022-04-01 DIAGNOSIS — Z96651 Presence of right artificial knee joint: Secondary | ICD-10-CM | POA: Diagnosis not present

## 2022-04-01 DIAGNOSIS — R5383 Other fatigue: Secondary | ICD-10-CM | POA: Diagnosis not present

## 2022-04-01 DIAGNOSIS — Z950 Presence of cardiac pacemaker: Secondary | ICD-10-CM | POA: Diagnosis not present

## 2022-04-01 DIAGNOSIS — R531 Weakness: Secondary | ICD-10-CM | POA: Diagnosis present

## 2022-04-01 DIAGNOSIS — D62 Acute posthemorrhagic anemia: Secondary | ICD-10-CM | POA: Diagnosis not present

## 2022-04-01 DIAGNOSIS — R000 Tachycardia, unspecified: Secondary | ICD-10-CM | POA: Diagnosis not present

## 2022-04-01 LAB — CBC
HCT: 26.8 % — ABNORMAL LOW (ref 36.0–46.0)
HCT: 27.3 % — ABNORMAL LOW (ref 36.0–46.0)
Hemoglobin: 8.6 g/dL — ABNORMAL LOW (ref 12.0–15.0)
Hemoglobin: 9 g/dL — ABNORMAL LOW (ref 12.0–15.0)
MCH: 30.8 pg (ref 26.0–34.0)
MCH: 31 pg (ref 26.0–34.0)
MCHC: 32.1 g/dL (ref 30.0–36.0)
MCHC: 33 g/dL (ref 30.0–36.0)
MCV: 96.1 fL (ref 80.0–100.0)
MCV: 94.1 fL (ref 80.0–100.0)
Platelets: 329 10*3/uL (ref 150–400)
Platelets: 330 10*3/uL (ref 150–400)
RBC: 2.79 MIL/uL — ABNORMAL LOW (ref 3.87–5.11)
RBC: 2.9 MIL/uL — ABNORMAL LOW (ref 3.87–5.11)
RDW: 13.8 % (ref 11.5–15.5)
RDW: 13.9 % (ref 11.5–15.5)
WBC: 5.3 10*3/uL (ref 4.0–10.5)
WBC: 6.2 10*3/uL (ref 4.0–10.5)
nRBC: 0 % (ref 0.0–0.2)
nRBC: 0 % (ref 0.0–0.2)

## 2022-04-01 LAB — I-STAT CHEM 8, ED
BUN: 9 mg/dL (ref 8–23)
Calcium, Ion: 1.19 mmol/L (ref 1.15–1.40)
Chloride: 103 mmol/L (ref 98–111)
Creatinine, Ser: 0.7 mg/dL (ref 0.44–1.00)
Glucose, Bld: 136 mg/dL — ABNORMAL HIGH (ref 70–99)
HCT: 23 % — ABNORMAL LOW (ref 36.0–46.0)
Hemoglobin: 7.8 g/dL — ABNORMAL LOW (ref 12.0–15.0)
Potassium: 3.6 mmol/L (ref 3.5–5.1)
Sodium: 138 mmol/L (ref 135–145)
TCO2: 23 mmol/L (ref 22–32)

## 2022-04-01 LAB — CBC WITH DIFFERENTIAL/PLATELET
Abs Immature Granulocytes: 0.02 10*3/uL (ref 0.00–0.07)
Basophils Absolute: 0 10*3/uL (ref 0.0–0.1)
Basophils Relative: 0 %
Eosinophils Absolute: 0.1 10*3/uL (ref 0.0–0.5)
Eosinophils Relative: 2 %
HCT: 26.5 % — ABNORMAL LOW (ref 36.0–46.0)
Hemoglobin: 8.8 g/dL — ABNORMAL LOW (ref 12.0–15.0)
Immature Granulocytes: 0 %
Lymphocytes Relative: 15 %
Lymphs Abs: 0.9 10*3/uL (ref 0.7–4.0)
MCH: 31.7 pg (ref 26.0–34.0)
MCHC: 33.2 g/dL (ref 30.0–36.0)
MCV: 95.3 fL (ref 80.0–100.0)
Monocytes Absolute: 0.5 10*3/uL (ref 0.1–1.0)
Monocytes Relative: 9 %
Neutro Abs: 4.4 10*3/uL (ref 1.7–7.7)
Neutrophils Relative %: 74 %
Platelets: 312 10*3/uL (ref 150–400)
RBC: 2.78 MIL/uL — ABNORMAL LOW (ref 3.87–5.11)
RDW: 13.7 % (ref 11.5–15.5)
WBC: 5.9 10*3/uL (ref 4.0–10.5)
nRBC: 0 % (ref 0.0–0.2)

## 2022-04-01 LAB — COMPREHENSIVE METABOLIC PANEL
ALT: 15 U/L (ref 0–44)
AST: 18 U/L (ref 15–41)
Albumin: 3.1 g/dL — ABNORMAL LOW (ref 3.5–5.0)
Alkaline Phosphatase: 56 U/L (ref 38–126)
Anion gap: 8 (ref 5–15)
BUN: 10 mg/dL (ref 8–23)
CO2: 25 mmol/L (ref 22–32)
Calcium: 8.8 mg/dL — ABNORMAL LOW (ref 8.9–10.3)
Chloride: 104 mmol/L (ref 98–111)
Creatinine, Ser: 0.79 mg/dL (ref 0.44–1.00)
GFR, Estimated: >60 mL/min (ref >60)
Glucose, Bld: 139 mg/dL — ABNORMAL HIGH (ref 70–99)
Potassium: 3.5 mmol/L (ref 3.5–5.1)
Sodium: 137 mmol/L (ref 135–145)
Total Bilirubin: 0.9 mg/dL (ref 0.3–1.2)
Total Protein: 6.6 g/dL (ref 6.5–8.1)

## 2022-04-01 LAB — TYPE AND SCREEN
ABO/RH(D): A POS
Antibody Screen: NEGATIVE

## 2022-04-01 LAB — TROPONIN I (HIGH SENSITIVITY)
Troponin I (High Sensitivity): 7 ng/L (ref <18)
Troponin I (High Sensitivity): 8 ng/L (ref <18)

## 2022-04-01 LAB — ECHOCARDIOGRAM LIMITED: S' Lateral: 2.6 cm

## 2022-04-01 LAB — PROTIME-INR
INR: 1.7 — ABNORMAL HIGH (ref 0.8–1.2)
Prothrombin Time: 19.4 seconds — ABNORMAL HIGH (ref 11.4–15.2)

## 2022-04-01 MED ORDER — FAMOTIDINE 20 MG PO TABS
40.0000 mg | ORAL_TABLET | ORAL | Status: DC
  Administered 2022-04-02: 40 mg via ORAL
  Filled 2022-04-01: qty 2

## 2022-04-01 MED ORDER — SODIUM CHLORIDE 0.9% FLUSH
3.0000 mL | Freq: Two times a day (BID) | INTRAVENOUS | Status: DC
  Administered 2022-04-01 – 2022-04-02 (×3): 3 mL via INTRAVENOUS

## 2022-04-01 MED ORDER — ROSUVASTATIN CALCIUM 5 MG PO TABS
5.0000 mg | ORAL_TABLET | Freq: Every day | ORAL | Status: DC
  Administered 2022-04-01: 5 mg via ORAL
  Filled 2022-04-01 (×2): qty 1

## 2022-04-01 MED ORDER — RIVAROXABAN 15 MG PO TABS
15.0000 mg | ORAL_TABLET | Freq: Two times a day (BID) | ORAL | Status: DC
  Administered 2022-04-01 – 2022-04-02 (×2): 15 mg via ORAL
  Filled 2022-04-01 (×2): qty 1

## 2022-04-01 MED ORDER — SODIUM CHLORIDE 0.9% FLUSH: 3.0000 mL | INTRAVENOUS | Status: DC | PRN

## 2022-04-01 MED ORDER — RIVAROXABAN (XARELTO) EDUCATION KIT FOR DVT/PE PATIENTS
PACK | Freq: Once | Status: AC
Start: 2022-04-01 — End: 2022-04-01
  Filled 2022-04-01: qty 1

## 2022-04-01 MED ORDER — IOHEXOL 350 MG/ML SOLN
100.0000 mL | Freq: Once | INTRAVENOUS | Status: AC | PRN
  Administered 2022-04-01: 100 mL via INTRAVENOUS

## 2022-04-01 MED ORDER — OXYCODONE HCL 5 MG PO TABS: 5.0000 mg | ORAL_TABLET | Freq: Four times a day (QID) | ORAL | Status: DC | PRN

## 2022-04-01 MED ORDER — ACETAMINOPHEN 325 MG PO TABS: 650.0000 mg | ORAL_TABLET | Freq: Four times a day (QID) | ORAL | Status: DC | PRN

## 2022-04-01 MED ORDER — SODIUM CHLORIDE 0.9 % IV SOLN: 250.0000 mL | INTRAVENOUS | Status: DC | PRN

## 2022-04-01 MED ORDER — RIVAROXABAN 10 MG PO TABS: 20.0000 mg | ORAL_TABLET | Freq: Every day | ORAL | Status: DC

## 2022-04-01 MED ORDER — ACETAMINOPHEN 650 MG RE SUPP: 650.0000 mg | Freq: Four times a day (QID) | RECTAL | Status: DC | PRN

## 2022-04-01 NOTE — Consult Note (Signed)
ELECTROPHYSIOLOGY CONSULT NOTE    Patient ID: Kari [name] MRN: [id number], DOB/AGE: 10-30-1950 71 y.o.  Admit date: 04/01/2022 Date of Consult: 04/01/2022  Primary Physician: Shelda Pal, DO Primary Cardiologist: Cristopher Peru, MD  Electrophysiologist: Dr. Lovena Le  Referring Provider: Dr. Regenia Skeeter  Patient Profile: Kari [name] is a 71 y.o. female with a history of CHB who is being seen today for the evaluation of fatigue, weakness, and malaise at the request of Dr. Regenia Skeeter given recent pacemaker procedure.  HPI:  Kari [name] is a 71 y.o. female with medical history as above.   Underwent Dual chamber pacemaker extraction on 8/1 due to lead malfunction with implantation of a new dual chamber [employer] device.    Returned to ED 8/2 with left arm swelling and found to have new DVTs. Started on Eliquis but with new device this was delayed to a 8/5 start date.   This am she took her medications and felt suddenly very poor, with diaphoresis, tachypalpitations, and sense of impending doom and called 911.   Vitals relatively stable. HR max 105 in transit.   POC Echocardiogram without obvious effusion. EF looks normal to hyperdynamic.   Pending limited echo and CTA chest/abdomen.   Potassium3.6 (08/08 1034) Creatinine, ser  0.70 (08/08 1034) PLT  312 (08/08 1012) HGB  7.8* (08/08 1034) (POC) Blood draw Hgb 8.8. WBC 5.9 (08/08 1012)  Currently, pt is sitting upright in bed not in visible discomfort. She appears fatigued and is complaining of non-specific global sensation of "euphoria" and tingling as well as a "chilly" sensation. Her left arm remains somewhat swollen, but improve from last week. Denies fevers at home. Denies black, tarry stool or BRBPR. No nausea, vomiting, or diarrhea.   Past Medical History:  Diagnosis Date   Arthritis    Chronic back pain    Diverticulitis    DVT (deep venous thrombosis) [facility]) age 38   s/p  knee arthroscopy     Herpes zoster conjunctivitis    Menopause    Mobitz type 2 second degree AV block    2:1/notes 02/16/2013   PONV (postoperative nausea and vomiting)    Presence of permanent cardiac pacemaker    Seasonal allergies    "spring & fall; not q year" (02/17/2013)   Shoulder impingement    and left rotator cuff tear     Surgical History:  Past Surgical History:  Procedure Laterality Date   ACHILLES TENDON SURGERY     ARTHROGRAM KNEE Right 1975   CHOLECYSTECTOMY  1990's   colonscopy     ESOPHAGOGASTRODUODENOSCOPY     EYE SURGERY     muscle release right eye   INSERT / REPLACE / REMOVE PACEMAKER  02/17/2013   [employer] Advantio dual-chamber pacemaker, model [id number],   KNEE ARTHROSCOPY Right 1982, 1983, 1995, 1996   KNEE ARTHROSCOPY W/ ACL RECONSTRUCTION Left 1984   LAPAROSCOPIC SIGMOID COLECTOMY N/A 01/01/2015   Procedure: LAPAROSCOPIC SIGMOID COLECTOMY;  Surgeon: Stark Klein, MD;  Location: [place];  Service: General;  Laterality: N/A;   LEAD EXTRACTION N/A 03/24/2022   Procedure: LEAD EXTRACTION;  Surgeon: Evans Lance, MD;  Location: [place] CV LAB;  Service: Cardiovascular;  Laterality: N/A;   PACEMAKER IMPLANT N/A 03/24/2022   Procedure: PACEMAKER IMPLANT;  Surgeon: Evans Lance, MD;  Location: [place] CV LAB;  Service: Cardiovascular;  Laterality: N/A;   PERMANENT PACEMAKER INSERTION N/A 02/17/2013   Procedure: PERMANENT PACEMAKER INSERTION;  Surgeon: Evans Lance, MD;  Location: [place] CATH LAB;  Service: Cardiovascular;  Laterality: N/A;   SHOULDER ARTHROSCOPY Left 09/05/2016   Procedure: LEFT SHOULDER ARTHROSCOPY , ACROMIOPLASTY AND ROTATOR CUFF REPAIR;  Surgeon: Melrose Nakayama, MD;  Location: [place];  Service: Orthopedics;  Laterality: Left;   TONSILLECTOMY  1961   TOTAL KNEE ARTHROPLASTY Right 2000; 06/2000   "replaced w/appropriate hardware" (02/17/2013)   TOTAL KNEE REVISION Right 04/13/2019   Procedure: Revision Right Knee Arthroplasty;  Surgeon: Frederik Pear, MD;   Location: [facility];  Service: Orthopedics;  Laterality: Right;   TRIGGER FINGER RELEASE Right ~ 2012   "thumb" (02/17/2013)   VAGINAL HYSTERECTOMY  ~ 1994   partial      (Not in a hospital admission)   Inpatient Medications:   Allergies:  Allergies  Allergen Reactions   Bactrim [Sulfamethoxazole-Trimethoprim] Hives   Ceftin Itching    Tolerates amoxicillin   Vancomycin Hives   Levofloxacin Other (See Comments)    "spacey," tolerates Cipro    Social History   Socioeconomic History   Marital status: Married    Spouse name: Not on file   Number of children: Not on file   Years of education: Not on file   Highest education level: Not on file  Occupational History   Occupation: Programmer, multimedia: [employer]: [employer]  Tobacco Use   Smoking status: Never   Smokeless tobacco: Never  Vaping Use   Vaping Use: Never used  Substance and Sexual Activity   Alcohol use: Yes    Comment: occasional wine    Drug use: No   Sexual activity: Yes    Birth control/protection: Surgical    Comment: 02/17/2013 "I have a same sex partner"  Other Topics Concern   Not on file  Social History Narrative   Not on file   Social Determinants of Health   Financial Resource Strain: Not on file  Food Insecurity: Not on file  Transportation Needs: Not on file  Physical Activity: Not on file  Stress: Not on file  Social Connections: Not on file  Intimate Partner Violence: Not on file     Family History  Problem Relation Age of Onset   Hypertension Mother    Hyperlipidemia Mother    Macular degeneration Mother    COPD Mother    Lung cancer Father    Cancer Father        Lung   COPD Father    Diabetes Brother    Breast cancer Paternal Aunt      Review of Systems: All other systems reviewed and are otherwise negative except as noted above.  Physical Exam: Vitals:   04/01/22 0953 04/01/22 0955 04/01/22 1049  BP: 124/81 124/81 134/77  Pulse: 74 67  70  Resp: '18 18 17  '$ Temp: 98.4 F (36.9 C) 98.4 F (36.9 C)   TempSrc: Oral Oral   SpO2: 97% 99% 98%    GEN- The patient is well appearing, alert and oriented x 3 today.   HEENT: normocephalic, atraumatic; sclera clear, conjunctiva pink; hearing intact; oropharynx clear; neck supple Lungs- Clear to ausculation bilaterally, normal work of breathing.  No wheezes, rales, rhonchi Heart- Regular rate and rhythm, no murmurs, rubs or gallops GI- soft, non-tender, non-distended, bowel sounds present Extremities- no clubbing, cyanosis, or edema; DP/PT/radial pulses 2+ bilaterally MS- no significant deformity or atrophy Skin- warm and dry, no rash or lesion Psych- euthymic mood, full affect Neuro- strength and sensation are  intact  Labs:   Lab Results  Component Value Date   WBC 5.9 04/01/2022   HGB 7.8 (L) 04/01/2022   HCT 23.0 (L) 04/01/2022   MCV 95.3 04/01/2022   PLT 312 04/01/2022    Recent Labs  Lab 03/26/22 1509 04/01/22 1034  NA 137 138  K 3.4* 3.6  CL 105 103  CO2 26  --   BUN 11 9  CREATININE 0.78 0.70  CALCIUM 8.9  --   GLUCOSE 107* 136*      Radiology/Studies: DG Chest Portable 1 View  Result Date: 04/01/2022 CLINICAL DATA:  Provided history: Near-syncope. EXAM: PORTABLE CHEST 1 VIEW COMPARISON:  Prior chest radiographs 03/25/2022 and earlier. FINDINGS: Left chest multi-lead implantable cardiac device, unchanged in appearance from the prior chest radiograph of 03/25/2022. Heart size within normal limits. No appreciable airspace consolidation or pulmonary edema. No evidence of pleural effusion or pneumothorax. No acute bony abnormality identified. IMPRESSION: No evidence of acute cardiopulmonary abnormality. Electronically Signed   By: Kellie Simmering D.O.   On: 04/01/2022 10:25   US Venous Img Upper Left (DVT Study)  Result Date: 03/26/2022 CLINICAL DATA:  Recent pacemaker EXAM: LEFT UPPER EXTREMITY VENOUS DOPPLER ULTRASOUND TECHNIQUE: Gray-scale sonography with graded  compression, as well as color Doppler and duplex ultrasound were performed to evaluate the upper extremity deep venous system from the level of the subclavian vein and including the jugular, axillary, basilic, radial, ulnar and upper cephalic vein. Spectral Doppler was utilized to evaluate flow at rest and with distal augmentation maneuvers. COMPARISON:  None Available. FINDINGS: Contralateral Subclavian Vein: Respiratory phasicity is normal and symmetric with the symptomatic side. No evidence of thrombus. Normal compressibility. Internal Jugular Vein: No evidence of thrombus. Normal compressibility, respiratory phasicity and response to augmentation. Subclavian Vein: Occlusive thrombus. Axillary Vein: Occlusive thrombus. Cephalic Vein: Occlusive thrombus. Basilic Vein: Occlusive thrombus. Brachial Veins: No evidence of thrombus. Normal compressibility, respiratory phasicity and response to augmentation. Radial Veins: No evidence of thrombus. Normal compressibility, respiratory phasicity and response to augmentation. Ulnar Veins: No evidence of thrombus. Normal compressibility, respiratory phasicity and response to augmentation. Venous Reflux:  None visualized. Other Findings:  None visualized. IMPRESSION: Positive examination for deep venous thrombosis in the left upper extremity with occlusive thrombus in the left subclavian and axillary veins. Additional superficial thrombosis of the cephalic and basilic veins. These results will be called to the ordering clinician or representative by the Radiologist Assistant, and communication documented in the PACS or [employer]. Electronically Signed   By: Delanna Ahmadi M.D.   On: 03/26/2022 16:39   DG Chest 2 View  Result Date: 03/25/2022 CLINICAL DATA:  Pacemaker. EXAM: CHEST - 2 VIEW COMPARISON:  Chest x-ray dated April 11, 2019. FINDINGS: Interval pacemaker revision with new pacemaker generator and right atrial and right ventricular leads. The leads are more  proximal in location compared to the prior leads. The heart size and mediastinal contours are within normal limits. Normal pulmonary vascularity. Mild linear atelectasis in the medial left lower lobe. No focal consolidation, pleural effusion, or pneumothorax. No acute osseous abnormality. IMPRESSION: 1. Interval pacemaker revision. Right atrial and ventricular leads are more proximal in location compared to the prior leads. Correlate with procedure report and pacemaker function. 2. No acute cardiopulmonary disease. Electronically Signed   By: Titus Dubin M.D.   On: 03/25/2022 09:01   ECHOCARDIOGRAM COMPLETE  Result Date: 03/24/2022    ECHOCARDIOGRAM REPORT   Patient Name:   ORLY QUIMBY Date of Exam: 03/24/2022  Medical Rec #:  [id number]           Height:       68.0 in Accession #:    [id number]          Weight:       167.1 lb Date of Birth:  12-Apr-1951            BSA:          1.893 m Patient Age:    45 years            BP:           107/63 mmHg Patient Gender: F                   HR:           60 bpm. Exam Location:  Inpatient Procedure: 2D Echo, Cardiac Doppler and Color Doppler                        STAT ECHO Reported to: Dr Carlyle Dolly on 03/24/2022 8:20:00 PM. Indications:    Abnormal ECG  History:        Patient has prior history of Echocardiogram examinations. GERD.                 S/P lead extraction 03/24/22.  Sonographer:    Clayton Lefort RDCS (AE) Referring Phys: [place] Comments: Limited range of motion for left arm, limiting imaging windows. IMPRESSIONS  1. Left ventricular ejection fraction, by estimation, is 65 to 70%. The left ventricle has normal function. The left ventricle has no regional wall motion abnormalities. There is mild left ventricular hypertrophy. Left ventricular diastolic parameters are indeterminate.  2. Right ventricular systolic function is normal. The right ventricular size is normal.  3. The mitral valve is normal in structure. No evidence of  mitral valve regurgitation. No evidence of mitral stenosis.  4. The aortic valve has an indeterminant number of cusps. Aortic valve regurgitation is not visualized. No aortic stenosis is present.  5. IVC is small suggesting low RA pressure and hypovolemia. FINDINGS  Left Ventricle: Left ventricular ejection fraction, by estimation, is 65 to 70%. The left ventricle has normal function. The left ventricle has no regional wall motion abnormalities. The left ventricular internal cavity size was normal in size. There is  mild left ventricular hypertrophy. Left ventricular diastolic parameters are indeterminate. Right Ventricle: The right ventricular size is normal. Right vetricular wall thickness was not well visualized. Right ventricular systolic function is normal. Left Atrium: Left atrial size was normal in size. Right Atrium: Right atrial size was normal in size. Pericardium: There is no evidence of pericardial effusion. Mitral Valve: The mitral valve is normal in structure. No evidence of mitral valve regurgitation. No evidence of mitral valve stenosis. Tricuspid Valve: The tricuspid valve is normal in structure. Tricuspid valve regurgitation is not demonstrated. No evidence of tricuspid stenosis. Aortic Valve: The aortic valve has an indeterminant number of cusps. Aortic valve regurgitation is not visualized. No aortic stenosis is present. Aortic valve mean gradient measures 3.2 mmHg. Aortic valve peak gradient measures 6.7 mmHg. Aortic valve area, by VTI measures 2.68 cm. Pulmonic Valve: The pulmonic valve was not well visualized. Pulmonic valve regurgitation is not visualized. No evidence of pulmonic stenosis. Aorta: The aortic root is normal in size and structure. Venous: IVC is small suggesting low RA pressure and hypovolemia. IAS/Shunts: The interatrial septum was not well visualized.  LEFT VENTRICLE PLAX 2D LVIDd:         3.80 cm   Diastology LV PW:         1.10 cm   LV e' medial:    6.42 cm/s LV IVS:         1.20 cm   LV E/e' medial:  12.0 LVOT diam:     1.90 cm   LV e' lateral:   6.64 cm/s LV SV:         63        LV E/e' lateral: 11.6 LV SV Index:   33 LVOT Area:     2.84 cm  RIGHT VENTRICLE             IVC RV Basal diam:  2.70 cm     IVC diam: 1.10 cm RV S prime:     16.00 cm/s TAPSE (M-mode): 3.0 cm LEFT ATRIUM           Index        RIGHT ATRIUM          Index LA Vol (A2C): 27.5 ml 14.52 ml/m  RA Area:     9.86 cm LA Vol (A4C): 35.5 ml 18.75 ml/m  RA Volume:   20.40 ml 10.77 ml/m  AORTIC VALVE AV Area (Vmax):    2.56 cm AV Area (Vmean):   2.52 cm AV Area (VTI):     2.68 cm AV Vmax:           129.78 cm/s AV Vmean:          81.178 cm/s AV VTI:            0.235 m AV Peak Grad:      6.7 mmHg AV Mean Grad:      3.2 mmHg LVOT Vmax:         117.00 cm/s LVOT Vmean:        72.100 cm/s LVOT VTI:          0.222 m LVOT/AV VTI ratio: 0.94  AORTA Ao Root diam: 3.70 cm MITRAL VALVE MV Area (PHT): 2.37 cm    SHUNTS MV Decel Time: 320 msec    Systemic VTI:  0.22 m MV E velocity: 76.80 cm/s  Systemic Diam: 1.90 cm MV A velocity: 46.90 cm/s MV E/A ratio:  1.64 Carlyle Dolly MD Electronically signed by Carlyle Dolly MD Signature Date/Time: 03/24/2022/8:25:35 PM    Final    EP PPM/ICD IMPLANT  Result Date: 03/24/2022 Conclusion: Successful removal of a previously implanted dual-chamber pacemaker secondary to failure to output due to ventricular pacing noise, followed by successful insertion of a [employer] dual-chamber pacemaker in a patient with complete heart block.  Because the patient's old pacemaker generator was 2 years from ERI, a new dual-chamber pacemaker was inserted as well. Cristopher Peru, MD   CT HEAD CODE STROKE WO CONTRAST  Result Date: 03/20/2022 CLINICAL DATA:  Code stroke. Initial evaluation for acute stroke, left arm weakness. EXAM: CT HEAD WITHOUT CONTRAST TECHNIQUE: Contiguous axial images were obtained from the base of the skull through the vertex without intravenous contrast. RADIATION  DOSE REDUCTION: This exam was performed according to the departmental dose-optimization program which includes automated exposure control, adjustment of the mA and/or kV according to patient size and/or use of iterative reconstruction technique. COMPARISON:  Prior study from 01/30/2018. FINDINGS: Brain: Cerebral volume within normal limits for patient age. No evidence for acute intracranial hemorrhage. No findings to suggest acute large vessel territory infarct. No  mass lesion, midline shift, or mass effect. Ventricles are normal in size without evidence for hydrocephalus. No extra-axial fluid collection identified. Vascular: No hyperdense vessel identified. Skull: Scalp soft tissues demonstrate no acute abnormality. Calvarium intact. Sinuses/Orbits: Globes and orbital soft tissues within normal limits. Visualized paranasal sinuses are clear. No mastoid effusion. ASPECTS [facility] Stroke Program Early CT Score) - Ganglionic level infarction (caudate, lentiform nuclei, internal capsule, insula, M1-M3 cortex): 7 - Supraganglionic infarction (M4-M6 cortex): 3 Total score (0-10 with 10 being normal): 10 IMPRESSION: 1. Normal head CT.  No acute intracranial abnormality. 2. ASPECTS is 10. Critical Value/emergent results were called by telephone at the time of interpretation on 03/20/2022 at 2:56 am to provider [facility] , who verbally acknowledged these results. Electronically Signed   By: Jeannine Boga M.D.   On: 03/20/2022 02:57   XR HIP UNILAT W OR W/O PELVIS 1V LEFT  Result Date: 03/17/2022 An AP pelvis and lateral left hip shows no acute gross findings.  There is just slight signs of femoral acetabular impingement with a superior lateral bone spur off the femoral head which is present on the right hip.  There is also a small osteophyte just superior and posterior to the acetabulum.  The joint space itself is congruent.  DG Cervical Spine Complete  Result Date: 03/11/2022 CLINICAL DATA:  Pain EXAM:  CERVICAL SPINE - COMPLETE 4 VIEW COMPARISON:  Cervical spine radiograph dated March 02, 2018 FINDINGS: Vertebral body heights are well-maintained. Moderate multilevel degenerative disc disease, most pronounced at C5-C6 and C6-C7 unchanged when compared to prior. Minimal grade 1 anterolisthesis of C7 on T1, unchanged when compared to prior exam and likely degenerative. Moderate to severe multilevel facet arthropathy, unchanged when compared with prior exam. Dens is intact on open-mouth view. Soft tissues are unremarkable. IMPRESSION: 1. No acute osseous abnormality. 2. Degenerative changes of the cervical spine, similar prior exam. Electronically Signed   By: Yetta Glassman M.D.   On: 03/11/2022 15:33    EKG:on arrival shows AV dual pacing at 68 bpm (personally reviewed)  TELEMETRY: AV dual pacing 60-80s (personally reviewed)  DEVICE HISTORY:  Engineer, agricultural PPM implanted 02/17/2013 for Complete Heart Block Extraction and re-implant 8/1 with BSx DDD PPM  Assessment/Plan: 1.  CHB s/p extraction and re-implant with lead malfunction Device interrogation and CXR stable.  Thus far, initial cardiac work up is reassuring.  POC echo without obvious effusion and stable appearing EF. Formal echo pending.  CT chest pending  2. Malaise/lightheadedness  3. Anemia ? 2/2 to anemia with Hgb 12.8 on 7/31 and 8.8 today (7.8 value was on istat) Pending CT chest and abdomen.  Rapid [place] on arrival without gross abnormality.   No clear source of bleeding. ? If late presenting post operative anemia exacerbated by new start Eliquis.  ? If medication reaction. Not sure any utility in switching to Xarelto, but will discuss with MD.   4. Superficial LUE DVTs On treatment dose eliquis.  Likely exacerbating anemia  ADDENDUM Discussed with Dr. Regenia Skeeter and CT without gross abnormality. No PE. He will ask IM to admit to trend Hgb. Pharmacy assessment may also be helpful if felt to be somatic medication  reaction, and not just symptomatic anemia.   For questions or updates, please contact [place] Please consult [email or web address] for contact info under Cardiology/STEMI.  Jacalyn Lefevre, PA-C  04/01/2022 11:06 AM

## 2022-04-01 NOTE — Assessment & Plan Note (Signed)
Left upper extremity swelling s/p PPM exchange on 7/31 On 8/2 seen in ED and found thave DVT in the LUE with occlusive thrombus in the left subclavian and axillary veins, additional superficial thrombosis of the cephalic and basilic veins.  -discussed with vascular who recommended IV heparin; however, EP Dr. Lovena Le recommended eliquis be initiated on Saturday 8/5 due to risk of bleeding s/p PPM placement on 8/31 She last took eliquis this AM and does not want to take this again as she may have had symptoms related to SE from drug  -will start xarelto 12 hours from last dose of eliquis -monitor serial cbc as per above

## 2022-04-01 NOTE — ED Provider Notes (Signed)
[facility]PART[facility] Provider Note   CSN: [id number] Arrival date & time: 04/01/22  0930     History  No chief complaint on file.   Kari [name] is a 71 y.o. female.  HPI 71 year old female brought in by EMS for diaphoresis, not feeling well and tachycardia.  She has had a complicated past ONGE-95 days.  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  She had postop issues and was noted to be hypotensive and had blood loss.  Was able to be discharged on 8/1.  However she noted that she had some left arm swelling during that time.  Went to [facility] on 8/2 and was found to have upper extremity DVTs.  At that time the EDP discussed with vascular and cardiology, Dr. Lovena Le and given concern for potential postoperative bleeding it was decided to hold off on anticoagulation until 8/5.  She has been started on Eliquis.  She has been taking that.  This morning shortly after taking her morning dose she started to feel diaphoretic, "euphoric" all over, and tachycardic.  Noted her heart rate to be as high as 115.  Denies chest pain but maybe there is some shortness of breath.  She is concerned the clot has propagated into her lungs.  No leg swelling.  The left arm is still swollen but improved.  EMS reports that her max heart rate was 105 per them and that she otherwise was stable throughout transport.  Home Medications Prior to Admission medications   Medication Sig Start Date End Date Taking? Authorizing Provider  APIXABAN (ELIQUIS) VTE STARTER PACK ('10MG'$  AND '5MG'$ ) Take as directed on package: start with two-'5mg'$  tablets twice daily for 7 days. On day 8, switch to one-'5mg'$  tablet twice daily. 03/29/22   Curatolo, Adam, DO  Cholecalciferol (VITAMIN D3) 125 MCG (5000 UT) CAPS Take 5,000 Units by mouth daily.    [provider]  Cyanocobalamin (VITAMIN B-12) 5000 MCG SUBL Place 5,000  mcg under the tongue daily.    [provider]  diclofenac sodium (VOLTAREN) 1 % GEL Apply 1-2 g topically 4 (four) times daily as needed (knee pain.).    [provider]  famotidine (PEPCID) 40 MG tablet Take 40 mg by mouth every morning.    [provider]  fluticasone (FLONASE) 50 MCG/ACT nasal spray Place 1 spray into both nostrils daily as needed for allergies or rhinitis.    [provider]  Melatonin 3 MG TABS Take 6 mg by mouth at bedtime as needed (sleep.).    [provider]  Multiple Vitamin (MULTIVITAMIN WITH MINERALS) TABS Take 1 tablet by mouth daily.    [provider]  Multiple Vitamins-Minerals (AIRBORNE GUMMIES PO) Take 2 each by mouth daily.    [provider]  Omega-3 Fatty Acids (FISH OIL ULTRA) 1400 MG CAPS Take 1,400 mg by mouth daily.    [provider]  oxyCODONE (ROXICODONE) 5 MG immediate release tablet Take 1 tablet (5 mg total) by mouth every 6 (six) hours as needed for up to 20 doses for breakthrough pain. 03/26/22   Curatolo, Adam, DO  phenazopyridine (PYRIDIUM) 100 MG tablet Take 1 tablet (100 mg total) by mouth 3 (three) times daily as needed for pain. Patient not taking: Reported on 03/21/2022 01/07/22   Brunetta Jeans, PA-C  Probiotic Product (PROBIOTIC FORMULA PO) Take 1 capsule by mouth daily.  [provider]  rosuvastatin (CRESTOR) 5 MG tablet Take 1 tablet (5 mg total) by mouth daily. 03/10/22   Shelda Pal, DO  valACYclovir (VALTREX) 1000 MG tablet Take 1 tablet (1,000 mg total) by mouth daily as needed. 08/02/21   Shelda Pal, DO      Allergies    Bactrim [sulfamethoxazole-trimethoprim], Ceftin, Vancomycin, and Levofloxacin    Review of Systems   Review of Systems  Constitutional:  Positive for diaphoresis.  Respiratory:  Positive for shortness of breath.   Cardiovascular:  Positive for palpitations. Negative for chest pain and leg swelling.   Musculoskeletal:  Positive for arthralgias.  Neurological:  Positive for light-headedness.    Physical Exam Updated Vital Signs BP 120/63   Pulse 72   Temp 98.4 F (36.9 C) (Oral)   Resp 14   SpO2 100%  Physical Exam Vitals and nursing note reviewed.  Constitutional:      Appearance: She is well-developed.  HENT:     Head: Normocephalic and atraumatic.  Cardiovascular:     Rate and Rhythm: Normal rate. Rhythm irregular.     Pulses:          Radial pulses are 2+ on the right side and 2+ on the left side.     Heart sounds: Normal heart sounds.  Pulmonary:     Effort: Pulmonary effort is normal.     Breath sounds: Normal breath sounds. No wheezing, rhonchi or rales.  Abdominal:     Palpations: Abdomen is soft.     Tenderness: There is no abdominal tenderness.  Musculoskeletal:     Right lower leg: No edema.     Left lower leg: No edema.     Comments: Left upper extremity is diffusely swollen.  Normal skin color  Skin:    General: Skin is warm and dry.  Neurological:     Mental Status: She is alert.     ED Results / Procedures / Treatments   Labs (all labs ordered are listed, but only abnormal results are displayed) Labs Reviewed  CBC WITH DIFFERENTIAL/PLATELET - Abnormal; Notable for the following components:      Result Value   RBC 2.78 (*)    Hemoglobin 8.8 (*)    HCT 26.5 (*)    All other components within normal limits  COMPREHENSIVE METABOLIC PANEL - Abnormal; Notable for the following components:   Glucose, Bld 139 (*)    Calcium 8.8 (*)    Albumin 3.1 (*)    All other components within normal limits  PROTIME-INR - Abnormal; Notable for the following components:   Prothrombin Time 19.4 (*)    INR 1.7 (*)    All other components within normal limits  I-STAT CHEM 8, ED - Abnormal; Notable for the following components:   Glucose, Bld 136 (*)    Hemoglobin 7.8 (*)    HCT 23.0 (*)    All other components within normal limits  PROTIME-INR  CBC  CBC   CBC  HIV ANTIBODY (ROUTINE TESTING W REFLEX)  TYPE AND SCREEN  TROPONIN I (HIGH SENSITIVITY)  TROPONIN I (HIGH SENSITIVITY)    EKG EKG Interpretation  Date/Time:  Tuesday April 01 2022 09:52:26 EDT Ventricular Rate:  68 PR Interval:  77 QRS Duration: 115 QT Interval:  448 QTC Calculation: 477 R Axis:   77 Text Interpretation: A-V dual-paced rhythm with some inhibition no significant change since Mar 26 2022 Confirmed by Sherwood Gambler [phone]) on 04/01/2022 10:18:07 AM  Radiology CT Angio  Chest PE W and/or Wo Contrast  Result Date: 04/01/2022 CLINICAL DATA:  Pulmonary embolism (PE) suspected, high prob EXAM: CT ANGIOGRAPHY CHEST WITH CONTRAST TECHNIQUE: Multidetector CT imaging of the chest was performed using the standard protocol during bolus administration of intravenous contrast. Multiplanar CT image reconstructions and MIPs were obtained to evaluate the vascular anatomy. RADIATION DOSE REDUCTION: This exam was performed according to the departmental dose-optimization program which includes automated exposure control, adjustment of the mA and/or kV according to patient size and/or use of iterative reconstruction technique. CONTRAST:  127m OMNIPAQUE IOHEXOL 350 MG/ML SOLN COMPARISON:  February 20, 2013 FINDINGS: Cardiovascular: Satisfactory opacification of the pulmonary arteries to the segmental level. No evidence of pulmonary embolism. Main pulmonary artery measures 3 cm in greatest diameter. Normal heart size. No pericardial effusion. Mediastinum/Nodes: No enlarged mediastinal, hilar, or axillary lymph nodes. Thyroid gland, trachea, and esophagus demonstrate no significant findings. Lungs/Pleura: Lungs are clear. No pleural effusion or pneumothorax. Upper Abdomen: No acute abnormality. Musculoskeletal: No chest wall abnormality. No acute or significant osseous findings. Review of the MIP images confirms the above findings. IMPRESSION: Unremarkable CT angiogram of the chest except for mild  enlargement of the main pulmonary artery measuring 3 cm in greatest dimension which may reflect pulmonary hypertension. No PE seen. Lungs are clear. Electronically Signed   By: AFrazier RichardsM.D.   On: 04/01/2022 12:03   CT ABDOMEN PELVIS W CONTRAST  Result Date: 04/01/2022 CLINICAL DATA:  71year old female with concern retroperitoneal hematoma. EXAM: CT ABDOMEN AND PELVIS WITH CONTRAST TECHNIQUE: Multidetector CT imaging of the abdomen and pelvis was performed using the standard protocol following bolus administration of intravenous contrast. RADIATION DOSE REDUCTION: This exam was performed according to the departmental dose-optimization program which includes automated exposure control, adjustment of the mA and/or kV according to patient size and/or use of iterative reconstruction technique. CONTRAST:  1037mOMNIPAQUE IOHEXOL 350 MG/ML SOLN COMPARISON:  None Available. FINDINGS: Lower chest: Please refer to the dedicated chest CT from the same day for intra thoracic findings. Hepatobiliary: No focal liver abnormality is seen. Status post cholecystectomy. No biliary dilatation. Pancreas: Unremarkable. No pancreatic ductal dilatation or surrounding inflammatory changes. Spleen: Normal in size without focal abnormality. Adrenals/Urinary Tract: Adrenal glands are unremarkable. Kidneys are normal, without renal calculi, focal lesion, or hydronephrosis. Bladder is unremarkable. Stomach/Bowel: Stomach is within normal limits. Appendix is normal. No evidence of bowel wall thickening, distention, or inflammatory changes. Vascular/Lymphatic: Aortic atherosclerosis. No enlarged abdominal or pelvic lymph nodes. Reproductive: Status post hysterectomy. No adnexal masses. Other: No abdominal wall hernia or abnormality. No abdominopelvic ascites. Musculoskeletal: Mild multilevel degenerative changes of visualized lumbar spine, most pronounced at L5-S1. No acute osseous abnormality. IMPRESSION: 1. No evidence of  retroperitoneal hematoma, as queried. 2. No acute abdominopelvic abnormality. DyRuthann CancerMD [facility] Electronically Signed   By: DyRuthann Cancer.D.   On: 04/01/2022 11:59   DG Chest Portable 1 View  Result Date: 04/01/2022 CLINICAL DATA:  Provided history: Near-syncope. EXAM: PORTABLE CHEST 1 VIEW COMPARISON:  Prior chest radiographs 03/25/2022 and earlier. FINDINGS: Left chest multi-lead implantable cardiac device, unchanged in appearance from the prior chest radiograph of 03/25/2022. Heart size within normal limits. No appreciable airspace consolidation or pulmonary edema. No evidence of pleural effusion or pneumothorax. No acute bony abnormality identified. IMPRESSION: No evidence of acute cardiopulmonary abnormality. Electronically Signed   By: KyKellie Simmering.O.   On: 04/01/2022 10:25    Procedures Procedures    Medications  Ordered in ED Medications  sodium chloride flush (NS) 0.9 % injection 3 mL (3 mLs Intravenous Given 04/01/22 1506)  sodium chloride flush (NS) 0.9 % injection 3 mL (has no administration in time range)  0.9 %  sodium chloride infusion (has no administration in time range)  acetaminophen (TYLENOL) tablet 650 mg (has no administration in time range)    Or  acetaminophen (TYLENOL) suppository 650 mg (has no administration in time range)  iohexol (OMNIPAQUE) 350 MG/ML injection 100 mL (100 mLs Intravenous Contrast Given 04/01/22 1148)    ED Course/ Medical Decision Making/ A&P Clinical Course as of 04/01/22 1515  Tue Apr 01, 2022  1105 I discussed with Dr. Quentin Ore at bedside. He advises to add on CT abd/pelvis to rule out retroperitoneal bleeding give femoral access for her pacemaker last week.  [SG]    Clinical Course User Index [SG] Sherwood Gambler, MD                           Medical Decision Making Amount and/or Complexity of Data Reviewed Labs: ordered.    Details: Hemoglobin of 8.8 is a little  lower than last week at 9.3 but not significantly so.  Electrolytes okay besides mild hypocalcemia but also has mildly low albumin troponin normal. Radiology: ordered and independent interpretation performed.    Details: CTA without PE.  CT abdomen without obvious fluid collection/retroperitoneal bleed ECG/medicine tests: ordered and independent interpretation performed.    Details: Paced rhythm without acute ischemia  Risk Prescription drug management. Decision regarding hospitalization.   Patient intermittently continues to state how poorly she is feeling though there is no focal findings.  Ultimately, no clear cause has been found.  Bedside echo by cardiology without obvious pericardial effusion.  CTs unremarkable as above.  Labs show anemia though only minimally worse than last week.  She denies seeing any recent bleeding.  At this point, discussed with cardiology notes that we should probably observe her overnight and consider that this might be an Eliquis side effect/adverse reaction and consider changing.  Does not need IV heparin according to cardiology.  Discussed with Dr. Rogers Blocker for admission.        Final Clinical Impression(s) / ED Diagnoses Final diagnoses:  Fatigue, unspecified type  Acute blood loss anemia    Rx / DC Orders ED Discharge Orders     None         Sherwood Gambler, MD 04/01/22 1517

## 2022-04-01 NOTE — Assessment & Plan Note (Signed)
Continue pepcid

## 2022-04-01 NOTE — H&P (Signed)
History and Physical    Patient: Kari [name] VCB:[id number] DOB: [birth year] DOA: 04/01/2022 DOS: the patient was seen and examined on 04/01/2022 PCP: Shelda Pal, DO  Patient coming from: Home - lives with her wife    Chief Complaint: weakness/fatigue/feeling of doom   HPI: Kari [name] is a 71 y.o. 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 EMS was called.   She took eliquis for the first time on Saturday and had a feeling of euphoria in the head and the arm with the clot. On Sunday and Monday she had no symptoms. Today she got up and was fine. She was doing some things around the house, took her medicine and then started to feel bad with same feeling she had on Saturday, but this time over her entire body. She had no energy, started to feel really weak, became diaphoretic, tachycardic and felt like "doom." She hasn't had another wave of these feelings in the last 1-2 hours. She states all of these feelings are completely abnormal. She has not been lightheaded or dizzy. NO chest pain or palpitations.   Admitted to [facility] by Dr. Curt Bears on 7/27 due to pacemaker malfunction. Pacer interrogation revealed inhibition due to ventricular lead noise leading to HRs in the 30-40s. Pt was programmed from DDD 50 to DOO 70 with relief of symptoms. She was then admitted on 7/31 for PPM extraction and re-implanted by Dr. Lovena Le with usual post op care. On 8/2 seen in ED for left upper extremity edema. Found to have Positive examination for deep venous thrombosis in the left upper extremity with occlusive thrombus in the left subclavian and axillary veins. Additional superficial thrombosis of the cephalic and basilic veins. Discussed with Dr. Lovena Le who recommended starting eliquis Saturday due to risk of bleeding from  PPM placement. She started this on 8/5 and was supposed to f/u on Monday with Dr. Lovena Le.    He has been feeling good. Denies any fever/chills, vision changes/headaches, chest pain or palpitations, shortness of breath or cough, abdominal pain, N/V/D, dysuria or leg swelling. Her chest is sore where she had the PPM extraction, but no redness, drainage from the site.   She denies any bleeding. Denies bloody or dark stools, no vaginal bleeding, no bruising. Colonoscopy was 2019, recall q 5 years. Hx of diverticulitis/losis.   She does not smoke or drink.    ER Course:  vitals: afebrile, bp: 124/81, HR: 67, RR: 18, oxygen: 99%RA Pertinent labs: hgb: 8.8 (10.8>9.3),  CXR: no acute finding  CTA chest: no PE CT abdomen: no acute finding or retroperitoneal hematoma In ED: EP consulted. tRH asked to admit     Review of Systems: As mentioned in the history of present illness. All other systems reviewed and are negative. Past Medical History:  Diagnosis Date   Arthritis    Chronic back pain    Diverticulitis    DVT (deep venous thrombosis) [facility]) age 30   s/p  knee arthroscopy    Herpes zoster conjunctivitis    Menopause    Mobitz type 2 second degree AV block    2:1/notes 02/16/2013   PONV (postoperative nausea and vomiting)    Presence of permanent cardiac pacemaker    Seasonal allergies    "spring & fall; not q year" (02/17/2013)   Shoulder impingement  and left rotator cuff tear   Past Surgical History:  Procedure Laterality Date   ACHILLES TENDON SURGERY     ARTHROGRAM KNEE Right 1975   CHOLECYSTECTOMY  1990's   colonscopy     ESOPHAGOGASTRODUODENOSCOPY     EYE SURGERY     muscle release right eye   INSERT / REPLACE / REMOVE PACEMAKER  02/17/2013   [employer] Advantio dual-chamber pacemaker, model [id number],   KNEE ARTHROSCOPY Right 1982, 1983, 1995, 1996   KNEE ARTHROSCOPY W/ ACL RECONSTRUCTION Left 1984   LAPAROSCOPIC SIGMOID COLECTOMY N/A 01/01/2015   Procedure:  LAPAROSCOPIC SIGMOID COLECTOMY;  Surgeon: Stark Klein, MD;  Location: [place];  Service: General;  Laterality: N/A;   LEAD EXTRACTION N/A 03/24/2022   Procedure: LEAD EXTRACTION;  Surgeon: Evans Lance, MD;  Location: [place] CV [facility];  Service: Cardiovascular;  Laterality: N/A;   PACEMAKER IMPLANT N/A 03/24/2022   Procedure: PACEMAKER IMPLANT;  Surgeon: Evans Lance, MD;  Location: [place] [facility];  Service: Cardiovascular;  Laterality: N/A;   PERMANENT PACEMAKER INSERTION N/A 02/17/2013   Procedure: PERMANENT PACEMAKER INSERTION;  Surgeon: Evans Lance, MD;  Location: [facility] CATH [facility];  Service: Cardiovascular;  Laterality: N/A;   SHOULDER ARTHROSCOPY Left 09/05/2016   Procedure: LEFT SHOULDER ARTHROSCOPY , ACROMIOPLASTY AND ROTATOR CUFF REPAIR;  Surgeon: Melrose Nakayama, MD;  Location: [place];  Service: Orthopedics;  Laterality: Left;   TONSILLECTOMY  1961   TOTAL KNEE ARTHROPLASTY Right 2000; 06/2000   "replaced w/appropriate hardware" (02/17/2013)   TOTAL KNEE REVISION Right 04/13/2019   Procedure: Revision Right Knee Arthroplasty;  Surgeon: Frederik Pear, MD;  Location: [facility];  Service: Orthopedics;  Laterality: Right;   TRIGGER FINGER RELEASE Right ~ 2012   "thumb" (02/17/2013)   VAGINAL HYSTERECTOMY  ~ 1994   partial    Social History:  reports that she has never smoked. She has never used smokeless tobacco. She reports current alcohol use. She reports that she does not use drugs.  Allergies  Allergen Reactions   Bactrim [Sulfamethoxazole-Trimethoprim] Hives   Ceftin Itching    Tolerates amoxicillin   Vancomycin Hives   Levofloxacin Other (See Comments)    "spacey," tolerates Cipro    Family History  Problem Relation Age of Onset   Hypertension Mother    Hyperlipidemia Mother    Macular degeneration Mother    COPD Mother    Lung cancer Father    Cancer Father        Lung   COPD Father    Diabetes Brother    Breast cancer Paternal Aunt     Prior to Admission  medications   Medication Sig Start Date End Date Taking? Authorizing Provider  APIXABAN Arne Cleveland) VTE STARTER PACK ('10MG'$  AND '5MG'$ ) Take as directed on package: start with two-'5mg'$  tablets twice daily for 7 days. On day 8, switch to one-'5mg'$  tablet twice daily. 03/29/22   Curatolo, Adam, DO  Cholecalciferol (VITAMIN D3) 125 MCG (5000 UT) CAPS Take 5,000 Units by mouth daily.    [provider]  Cyanocobalamin (VITAMIN B-12) 5000 MCG SUBL Place 5,000 mcg under the tongue daily.    [provider]  diclofenac sodium (VOLTAREN) 1 % GEL Apply 1-2 g topically 4 (four) times daily as needed (knee pain.).    [provider]  famotidine (PEPCID) 40 MG tablet Take 40 mg by mouth every morning.    [provider]  fluticasone (FLONASE) 50 MCG/ACT nasal spray Place 1 spray into both nostrils daily  as needed for allergies or rhinitis.    [provider]  Melatonin 3 MG TABS Take 6 mg by mouth at bedtime as needed (sleep.).    [provider]  Multiple Vitamin (MULTIVITAMIN WITH MINERALS) TABS Take 1 tablet by mouth daily.    [provider]  Multiple Vitamins-Minerals (AIRBORNE GUMMIES PO) Take 2 each by mouth daily.    [provider]  Omega-3 Fatty Acids (FISH OIL ULTRA) 1400 MG CAPS Take 1,400 mg by mouth daily.    [provider]  oxyCODONE (ROXICODONE) 5 MG immediate release tablet Take 1 tablet (5 mg total) by mouth every 6 (six) hours as needed for up to 20 doses for breakthrough pain. 03/26/22   Curatolo, Adam, DO  phenazopyridine (PYRIDIUM) 100 MG tablet Take 1 tablet (100 mg total) by mouth 3 (three) times daily as needed for pain. Patient not taking: Reported on 03/21/2022 01/07/22   Brunetta Jeans, PA-C  Probiotic Product (PROBIOTIC FORMULA PO) Take 1 capsule by mouth daily.    [provider]  rosuvastatin (CRESTOR) 5 MG tablet Take 1 tablet (5 mg total) by mouth daily. 03/10/22   Shelda Pal, DO   valACYclovir (VALTREX) 1000 MG tablet Take 1 tablet (1,000 mg total) by mouth daily as needed. 08/02/21   Shelda Pal, DO    Physical Exam: Vitals:   04/01/22 1049 04/01/22 1115 04/01/22 1145 04/01/22 1415  BP: 134/77 (!) 142/83 131/65 120/63  Pulse: 70 84 81 72  Resp: '17 15 18 14  '$ Temp:      TempSrc:      SpO2: 98% 98% 96% 100%   General:  Appears calm and comfortable and is in NAD Eyes:  PERRL, EOMI, normal lids, iris ENT:  grossly normal hearing, lips & tongue, mmm; appropriate dentition Neck:  no LAD, masses or thyromegaly; no carotid bruits Cardiovascular:  RRR, no m/r/g. No LE edema.  Respiratory:   CTA bilaterally with no wheezes/rales/rhonchi.  Normal respiratory effort. Abdomen:  soft, NT, ND, NABS Back:   normal alignment, no CVAT Skin:  no rash or induration seen on limited exam. LUE with edema and TTP. PPM incision clean and dry.  Musculoskeletal:  grossly normal tone BUE/BLE, good ROM, no bony abnormality Lower extremity:  No LE edema.  Limited foot exam with no ulcerations.  2+ distal pulses. Psychiatric:  grossly normal mood and affect, speech fluent and appropriate, AOx3 Neurologic:  CN 2-12 grossly intact, moves all extremities in coordinated fashion, sensation intact   Radiological Exams on Admission: Independently reviewed - see discussion in A/P where applicable  CT Angio Chest PE W and/or Wo Contrast  Result Date: 04/01/2022 CLINICAL DATA:  Pulmonary embolism (PE) suspected, high prob EXAM: CT ANGIOGRAPHY CHEST WITH CONTRAST TECHNIQUE: Multidetector CT imaging of the chest was performed using the standard protocol during bolus administration of intravenous contrast. Multiplanar CT image reconstructions and MIPs were obtained to evaluate the vascular anatomy. RADIATION DOSE REDUCTION: This exam was performed according to the departmental dose-optimization program which includes automated exposure control, adjustment of the mA and/or kV according to  patient size and/or use of iterative reconstruction technique. CONTRAST:  156m OMNIPAQUE IOHEXOL 350 MG/ML SOLN COMPARISON:  February 20, 2013 FINDINGS: Cardiovascular: Satisfactory opacification of the pulmonary arteries to the segmental level. No evidence of pulmonary embolism. Main pulmonary artery measures 3 cm in greatest diameter. Normal heart size. No pericardial effusion. Mediastinum/Nodes: No enlarged mediastinal, hilar, or axillary lymph nodes. Thyroid gland, trachea, and esophagus demonstrate  no significant findings. Lungs/Pleura: Lungs are clear. No pleural effusion or pneumothorax. Upper Abdomen: No acute abnormality. Musculoskeletal: No chest wall abnormality. No acute or significant osseous findings. Review of the MIP images confirms the above findings. IMPRESSION: Unremarkable CT angiogram of the chest except for mild enlargement of the main pulmonary artery measuring 3 cm in greatest dimension which may reflect pulmonary hypertension. No PE seen. Lungs are clear. Electronically Signed   By: Frazier Richards M.D.   On: 04/01/2022 12:03   CT ABDOMEN PELVIS W CONTRAST  Result Date: 04/01/2022 CLINICAL DATA:  71 year old female with concern retroperitoneal hematoma. EXAM: CT ABDOMEN AND PELVIS WITH CONTRAST TECHNIQUE: Multidetector CT imaging of the abdomen and pelvis was performed using the standard protocol following bolus administration of intravenous contrast. RADIATION DOSE REDUCTION: This exam was performed according to the departmental dose-optimization program which includes automated exposure control, adjustment of the mA and/or kV according to patient size and/or use of iterative reconstruction technique. CONTRAST:  142m OMNIPAQUE IOHEXOL 350 MG/ML SOLN COMPARISON:  None Available. FINDINGS: Lower chest: Please refer to the dedicated chest CT from the same day for intra thoracic findings. Hepatobiliary: No focal liver abnormality is seen. Status post cholecystectomy. No biliary dilatation.  Pancreas: Unremarkable. No pancreatic ductal dilatation or surrounding inflammatory changes. Spleen: Normal in size without focal abnormality. Adrenals/Urinary Tract: Adrenal glands are unremarkable. Kidneys are normal, without renal calculi, focal lesion, or hydronephrosis. Bladder is unremarkable. Stomach/Bowel: Stomach is within normal limits. Appendix is normal. No evidence of bowel wall thickening, distention, or inflammatory changes. Vascular/Lymphatic: Aortic atherosclerosis. No enlarged abdominal or pelvic lymph nodes. Reproductive: Status post hysterectomy. No adnexal masses. Other: No abdominal wall hernia or abnormality. No abdominopelvic ascites. Musculoskeletal: Mild multilevel degenerative changes of visualized lumbar spine, most pronounced at L5-S1. No acute osseous abnormality. IMPRESSION: 1. No evidence of retroperitoneal hematoma, as queried. 2. No acute abdominopelvic abnormality. DRuthann Cancer MD [facility] HospitalRadiology Electronically Signed   By: DRuthann CancerM.D.   On: 04/01/2022 11:59   DG Chest Portable 1 View  Result Date: 04/01/2022 CLINICAL DATA:  Provided history: Near-syncope. EXAM: PORTABLE CHEST 1 VIEW COMPARISON:  Prior chest radiographs 03/25/2022 and earlier. FINDINGS: Left chest multi-lead implantable cardiac device, unchanged in appearance from the prior chest radiograph of 03/25/2022. Heart size within normal limits. No appreciable airspace consolidation or pulmonary edema. No evidence of pleural effusion or pneumothorax. No acute bony abnormality identified. IMPRESSION: No evidence of acute cardiopulmonary abnormality. Electronically Signed   By: KKellie SimmeringD.O.   On: 04/01/2022 10:25    EKG: Independently reviewed.  NSR with rate 68, A-V dual paced ; nonspecific ST changes with no evidence of acute ischemia   Labs on Admission: I have personally reviewed the available labs and imaging studies at the time of the  admission.  Pertinent labs:   hgb: 8.8 (10.8>9.3),   Assessment and Plan: Principal Problem:   Acute blood loss anemia Active Problems:   DVT of left UE    S/P cardiac pacemaker procedure   GERD (gastroesophageal reflux disease)    Assessment and Plan: * Acute blood loss anemia 71year old presenting with weakness, fatigue and feeling of doom found to have hgb of 8.8 after PPM placement and initiation of eliquis in setting of acute UE DVT -obs to telemetry -hgb 8.8 today. On 7/31: 12.8  8/1: 10.8  8/2: 9.3 -no signs of active bleeding. NO PE/no retroperitoneal bleeding. Lost blood during PPM exchange on 7/31 +initaion  of eliquis -check echo after PPM exchange, EP following  -will check serial cbc q 6 hours to monitor H&H -discussed with EP, no need for heparin. Will change her over to xarelto, as she may have had a reaction to the eliquis as symptoms correlate with initiation of eliquis -pharmacy consulted   DVT of left UE  Left upper extremity swelling s/p PPM exchange on 7/31 On 8/2 seen in ED and found thave DVT in the LUE with occlusive thrombus in the left subclavian and axillary veins, additional superficial thrombosis of the cephalic and basilic veins.  -discussed with vascular who recommended IV heparin; however, EP Dr. Lovena Le recommended eliquis be initiated on Saturday 8/5 due to risk of bleeding s/p PPM placement on 8/31 She last took eliquis this AM and does not want to take this again as she may have had symptoms related to SE from drug  -will start xarelto 12 hours from last dose of eliquis -monitor serial cbc as per above   S/P cardiac pacemaker procedure S/p pacemaker extraction and re-implantation by Dr. Lovena Le on 7/31 EP is consulted, echo ordered  Troponin wnl x1, delta pending No chest pain, ekg with no acute changes   GERD (gastroesophageal reflux disease) Continue pepcid      Advance Care Planning:   Code Status: Full Code   Consults: EP. Dr. Lovena Le    DVT Prophylaxis: eliquis >xarelto   Family Communication: wife at bedside: Makynzie Dobesh   Severity of Illness: The appropriate patient status for this patient is OBSERVATION. Observation status is judged to be reasonable and necessary in order to provide the required intensity of service to ensure the patient's safety. The patient's presenting symptoms, physical exam findings, and initial radiographic and laboratory data in the context of their medical condition is felt to place them at decreased risk for further clinical deterioration. Furthermore, it is anticipated that the patient will be medically stable for discharge from the hospital within 2 midnights of admission.   Author: Orma Flaming, MD 04/01/2022 3:32 PM  For on call review [email or web address].

## 2022-04-01 NOTE — Assessment & Plan Note (Addendum)
71 year old presenting with weakness, fatigue and feeling of doom found to have hgb of 8.8 after PPM placement and initiation of eliquis in setting of acute UE DVT -obs to telemetry -hgb 8.8 today. On 7/31: 12.8  8/1: 10.8  8/2: 9.3 -no signs of active bleeding. NO PE/no retroperitoneal bleeding. Lost blood during PPM exchange on 7/31 +initaion of eliquis -check echo after PPM exchange, EP following  -will check serial cbc q 6 hours to monitor H&H -discussed with EP, no need for heparin. Will change her over to xarelto, as she may have had a reaction to the eliquis as symptoms correlate with initiation of eliquis -pharmacy consulted

## 2022-04-01 NOTE — ED Triage Notes (Signed)
Called [facility]. C/o diaphoric, tachycardia, and weakness. Pacemaker replaced 03/24/2022. Denies cp, sob.   Pt states that she was seen at med center and was dx with 4 clots on the left side started on eliquis.   Pt states that she got up this morning fine took her meds and started doing some things around the house and she had a sense of "doom" some over her and her heart started racing.

## 2022-04-01 NOTE — ED Notes (Signed)
Pt states not feeling right .

## 2022-04-01 NOTE — Progress Notes (Signed)
ANTICOAGULATION CONSULT NOTE - Initial Consult  Pharmacy Consult for Xarelto Indication: DVT  Allergies  Allergen Reactions   Bactrim [Sulfamethoxazole-Trimethoprim] Hives   Ceftin Itching    Tolerates amoxicillin   Vancomycin Hives   Levofloxacin Other (See Comments)    "spacey," tolerates Cipro    Patient Measurements:    Vital Signs: Temp: 98.4 F (36.9 C) (08/08 0955) Temp Source: Oral (08/08 0955) BP: 120/63 (08/08 1415) Pulse Rate: 72 (08/08 1415)  Labs: Recent Labs    04/01/22 1012 04/01/22 1015 04/01/22 1034  HGB 8.8*  --  7.8*  HCT 26.5*  --  23.0*  PLT 312  --   --   LABPROT  --  19.4*  --   INR  --  1.7*  --   CREATININE 0.79  --  0.70  TROPONINIHS 7  --   --     Estimated Creatinine Clearance: 67 mL/min (by C-G formula based on SCr of 0.7 mg/dL).   Medical History: Past Medical History:  Diagnosis Date   Arthritis    Chronic back pain    Diverticulitis    DVT (deep venous thrombosis) ([place]) age 66   s/p  knee arthroscopy    Herpes zoster conjunctivitis    Menopause    Mobitz type 2 second degree AV block    2:1/notes 02/16/2013   PONV (postoperative nausea and vomiting)    Presence of permanent cardiac pacemaker    Seasonal allergies    "spring & fall; not q year" (02/17/2013)   Shoulder impingement    and left rotator cuff tear    Medications:  (Not in a hospital admission)  Scheduled:   rivaroxaban   Does not apply Once   Rivaroxaban  15 mg Oral BID WC   Followed by   Derrill Memo ON 04/22/2022] rivaroxaban  20 mg Oral Q supper   sodium chloride flush  3 mL Intravenous Q12H   Infusions:   sodium chloride      Assessment: 5 yof with a history of is a 71 y.o. 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 Patient is reporting feeling poor, with diaphoresis, palpitations, and sense of impending doom.   Cardiology placed xarelto consult for switching  from eliquis in the event patient is experiencing an adverse event from the eliquis.  Last taken eliquis 8/8 ~ 0800.  Anemia noted in the ED with Hgb of 8.6 today (7.8 value was on istat) this is down from 12.8 on 7/31. No signs of active bleeding per MD.  PT/INR 19.4/1.7 Hgb 8.6; plt 329  Goal of Therapy:   Monitor platelets by anticoagulation protocol: Yes   Plan:  STOP Eliquis START Xarelto '15mg'$  BID x 21 days then switch to '20mg'$  daily with food Monitor CBC and for s/s of bleeding Education for xarelto already completed.  Lorelei Pont, PharmD, BCPS 04/01/2022 4:08 PM ED Clinical Pharmacist -  [phone]

## 2022-04-01 NOTE — Assessment & Plan Note (Signed)
S/p pacemaker extraction and re-implantation by Dr. Lovena Le on 7/31 EP is consulted, echo ordered  Troponin wnl x1, delta pending No chest pain, ekg with no acute changes

## 2022-04-01 NOTE — ED Notes (Signed)
PT states she believes HIV bloodwork was done when she had surgery and does not believe it needs to be done.  Pt politely refused to have HIV bloodwork drawn.

## 2022-04-02 ENCOUNTER — Telehealth: Payer: Self-pay | Admitting: Internal Medicine

## 2022-04-02 ENCOUNTER — Other Ambulatory Visit (HOSPITAL_COMMUNITY): Payer: Self-pay

## 2022-04-02 DIAGNOSIS — Z86718 Personal history of other venous thrombosis and embolism: Secondary | ICD-10-CM | POA: Diagnosis not present

## 2022-04-02 DIAGNOSIS — Z950 Presence of cardiac pacemaker: Secondary | ICD-10-CM

## 2022-04-02 DIAGNOSIS — Z7901 Long term (current) use of anticoagulants: Secondary | ICD-10-CM | POA: Diagnosis not present

## 2022-04-02 DIAGNOSIS — R5383 Other fatigue: Secondary | ICD-10-CM

## 2022-04-02 DIAGNOSIS — Z79899 Other long term (current) drug therapy: Secondary | ICD-10-CM | POA: Diagnosis not present

## 2022-04-02 DIAGNOSIS — K219 Gastro-esophageal reflux disease without esophagitis: Secondary | ICD-10-CM | POA: Diagnosis not present

## 2022-04-02 DIAGNOSIS — D62 Acute posthemorrhagic anemia: Secondary | ICD-10-CM | POA: Diagnosis not present

## 2022-04-02 DIAGNOSIS — Z96651 Presence of right artificial knee joint: Secondary | ICD-10-CM | POA: Diagnosis not present

## 2022-04-02 LAB — CBC
HCT: 25.5 % — ABNORMAL LOW (ref 36.0–46.0)
Hemoglobin: 8.6 g/dL — ABNORMAL LOW (ref 12.0–15.0)
MCH: 31.6 pg (ref 26.0–34.0)
MCHC: 33.7 g/dL (ref 30.0–36.0)
MCV: 93.8 fL (ref 80.0–100.0)
Platelets: 311 10*3/uL (ref 150–400)
RBC: 2.72 MIL/uL — ABNORMAL LOW (ref 3.87–5.11)
RDW: 13.9 % (ref 11.5–15.5)
WBC: 5.9 10*3/uL (ref 4.0–10.5)
nRBC: 0 % (ref 0.0–0.2)

## 2022-04-02 LAB — BASIC METABOLIC PANEL
Anion gap: 9 (ref 5–15)
BUN: 9 mg/dL (ref 8–23)
CO2: 25 mmol/L (ref 22–32)
Calcium: 8.9 mg/dL (ref 8.9–10.3)
Chloride: 105 mmol/L (ref 98–111)
Creatinine, Ser: 0.71 mg/dL (ref 0.44–1.00)
GFR, Estimated: >60 mL/min (ref >60)
Glucose, Bld: 104 mg/dL — ABNORMAL HIGH (ref 70–99)
Potassium: 3.7 mmol/L (ref 3.5–5.1)
Sodium: 139 mmol/L (ref 135–145)

## 2022-04-02 MED ORDER — RIVAROXABAN (XARELTO) VTE STARTER PACK (15 & 20 MG)
ORAL_TABLET | ORAL | 0 refills | Status: DC
  Filled 2022-04-02: qty 51, 30d supply, fill #0

## 2022-04-02 NOTE — Telephone Encounter (Signed)
Patient called to follow-up on her request for physician statement letter which was supposed to be mailed to her.  Patient stated she has not received her copy.

## 2022-04-02 NOTE — ED Notes (Signed)
Up to b/r, steady gait 

## 2022-04-02 NOTE — Discharge Summary (Signed)
Physician Discharge Summary   Patient: Kari [name] MRN: [id number] DOB: Jun 04, 1951  Admit date:     04/01/2022  Discharge date: 04/02/22  Discharge Physician: Marylu Lund   PCP: Shelda Pal, DO   Recommendations at discharge:    Follow up with PCP in 1-2 weeks Follow up with cardiology as scheduled  Discharge Diagnoses: Principal Problem:   Acute blood loss anemia Active Problems:   DVT of left UE    S/P cardiac pacemaker procedure   GERD (gastroesophageal reflux disease)  Resolved Problems:   * No resolved hospital problems. [facility] Course: 71 y.o. 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 EMS was called.    She took eliquis for the first time on Saturday and had a feeling of euphoria in the head and the arm with the clot. On Sunday and Monday she had no symptoms. Today she got up and was fine. She was doing some things around the house, took her medicine and then started to feel bad with same feeling she had on Saturday, but this time over her entire body. She had no energy, started to feel really weak, became diaphoretic, tachycardic and felt like "doom." She hasn't had another wave of these feelings in the last 1-2 hours. She states all of these feelings are completely abnormal. She has not been lightheaded or dizzy. NO chest pain or palpitations.    Admitted to [facility] by Dr. Curt Bears on 7/27 due to pacemaker malfunction. Pacer interrogation revealed inhibition due to ventricular lead noise leading to HRs in the 30-40s. Pt was programmed from DDD 50 to DOO 70 with relief of symptoms. She was then admitted on 7/31 for PPM extraction and re-implanted by Dr. Lovena Le with usual post op care. On 8/2 seen in ED for left upper extremity edema. Found to have Positive examination for deep venous  thrombosis in the left upper extremity with occlusive thrombus in the left subclavian and axillary veins. Additional superficial thrombosis of the cephalic and basilic veins. Discussed with Dr. Lovena Le who recommended starting eliquis Saturday due to risk of bleeding from PPM placement. She started this on 8/5 and was supposed to f/u on Monday with Dr. Lovena Le.      He has been feeling good. Denies any fever/chills, vision changes/headaches, chest pain or palpitations, shortness of breath or cough, abdominal pain, N/V/D, dysuria or leg swelling. Her chest is sore where she had the PPM extraction, but no redness, drainage from the site.    She denies any bleeding. Denies bloody or dark stools, no vaginal bleeding, no bruising. Colonoscopy was 2019, recall q 5 years. Hx of diverticulitis/losis.    She does not smoke or drink.      ER Course:  vitals: afebrile, bp: 124/81, HR: 67, RR: 18, oxygen: 99%RA Pertinent labs: hgb: 8.8 (10.8>9.3),  CXR: no acute finding  CTA chest: no PE CT abdomen: no acute finding or retroperitoneal hematoma In ED: EP consulted. tRH asked to admit    Assessment and Plan: * Recent acute blood loss anemia, none currently 71 year old presenting with weakness, fatigue and feeling of doom found to have hgb of 8.8 after PPM placement and initiation of eliquis in setting of acute UE DVT -obs to telemetry -no signs of active bleeding. NO PE/no retroperitoneal bleeding. Lost blood during  PPM exchange on 7/31 +initaion of eliquis -EP following  -Hgb remained stable -Seen by EP. Pt transitioned from Eliquis to xarelto, seemed to tolerate better. -Pt given rx for xarelto on d/c   DVT of left UE  Left upper extremity swelling s/p PPM exchange on 7/31 On 8/2 seen in ED and found thave DVT in the LUE with occlusive thrombus in the left subclavian and axillary veins, additional superficial thrombosis of the cephalic and basilic veins.  -transitioned to xarelto, tolerated    S/P cardiac pacemaker procedure S/p pacemaker extraction and re-implantation by Dr. Lovena Le on 7/31 EP is consulted No chest pain, ekg with no acute changes  Pt clear for d/c today per EP   GERD (gastroesophageal reflux disease) Continue pepcid         Consultants: EP Procedures performed:   Disposition: Home Diet recommendation:  Cardiac diet DISCHARGE MEDICATION: Allergies as of 04/02/2022       Reactions   Bactrim [sulfamethoxazole-trimethoprim] Hives   Ceftin Itching   Tolerates amoxicillin   Vancomycin Hives   Levofloxacin Other (See Comments)   "spacey," tolerates Cipro        Medication List     STOP taking these medications    phenazopyridine 100 MG tablet Commonly known as: Pyridium       TAKE these medications    acetaminophen 325 MG tablet Commonly known as: TYLENOL Take 650 mg by mouth every 6 (six) hours as needed for moderate pain or fever.   AIRBORNE GUMMIES PO Take 2 each by mouth daily.   CALCIUM PO Take 1 tablet by mouth daily.   diclofenac sodium 1 % Gel Commonly known as: VOLTAREN Apply 1-2 g topically 4 (four) times daily as needed (knee pain.).   famotidine 40 MG tablet Commonly known as: PEPCID Take 40 mg by mouth every morning.   Fish Oil Ultra 1400 MG Caps Take 1,400 mg by mouth daily.   fluticasone 50 MCG/ACT nasal spray Commonly known as: FLONASE Place 1 spray into both nostrils daily as needed for allergies or rhinitis.   melatonin 3 MG Tabs tablet Take 6 mg by mouth at bedtime as needed (sleep.).   multivitamin with minerals Tabs tablet Take 1 tablet by mouth daily.   oxyCODONE 5 MG immediate release tablet Commonly known as: Roxicodone Take 1 tablet (5 mg total) by mouth every 6 (six) hours as needed for up to 20 doses for breakthrough pain.   PROBIOTIC FORMULA PO Take 1 capsule by mouth daily.   Rivaroxaban Stater Pack (15 mg and 20 mg) Commonly known as: XARELTO STARTER PACK Follow package directions:  Take one '15mg'$  tablet by mouth twice a day. On day 22, switch to one '20mg'$  tablet once a day. Take with food.   rosuvastatin 5 MG tablet Commonly known as: Crestor Take 1 tablet (5 mg total) by mouth daily.   valACYclovir 1000 MG tablet Commonly known as: VALTREX Take 1 tablet (1,000 mg total) by mouth daily as needed. What changed: reasons to take this   Vitamin B-12 5000 MCG Subl Place 5,000 mcg under the tongue daily.   Vitamin D3 125 MCG (5000 UT) Caps Take 5,000 Units by mouth daily.        Follow-up Information     Shelda Pal, DO Follow up in 2 week(s).   Specialty: Family Medicine Why: Hospital follow up Contact information: [address] [place] [address] [phone]         Taylor,  Champ Mungo, MD .   Specialty: Cardiology Contact information: [address] [place] [address] [phone]                Discharge Exam: There were no vitals filed for this visit. General exam: Awake, laying in bed, in nad Respiratory system: Normal respiratory effort, no wheezing Cardiovascular system: regular rate, s1, s2 Gastrointestinal system: Soft, nondistended, positive BS Central nervous system: CN2-12 grossly intact, strength intact Extremities: Perfused, no clubbing Skin: Normal skin turgor, no notable skin lesions seen Psychiatry: Mood normal // no visual hallucinations    Condition at discharge: fair  The results of significant diagnostics from this hospitalization (including imaging, microbiology, ancillary and laboratory) are listed below for reference.   Imaging Studies: ECHOCARDIOGRAM LIMITED  Result Date: 04/01/2022    ECHOCARDIOGRAM LIMITED REPORT   Patient Name:   Kari [name] Date of Exam: 04/01/2022 Medical Rec #:  [id number]           Height:       68.0 in Accession #:    [id number]          Weight:       143.0 lb Date of Birth:  05-05-1951            BSA:          1.772 m Patient Age:     40 years            BP:           120/63 mmHg Patient Gender: F                   HR:           64 bpm. Exam Location:  Inpatient Procedure: Limited Echo Indications:    Pacemaker  History:        Patient has prior history of Echocardiogram examinations, most                 recent 03/25/2022.  Sonographer:    Jefferey Pica Referring Phys: [id number] [place]  1. Left ventricular ejection fraction, by estimation, is 60 to 65%. The left ventricle has normal function. The left ventricle has no regional wall motion abnormalities.  2. Right ventricular systolic function is normal. The right ventricular size is normal. There is normal pulmonary artery systolic pressure. The estimated right ventricular systolic pressure is 15.0 mmHg.  3. The mitral valve is normal in structure. No evidence of mitral stenosis.  4. The aortic valve is grossly normal. FINDINGS  Left Ventricle: Left ventricular ejection fraction, by estimation, is 60 to 65%. The left ventricle has normal function. The left ventricle has no regional wall motion abnormalities. The left ventricular internal cavity size was normal in size. There is  borderline concentric left ventricular hypertrophy. Abnormal (paradoxical) septal motion, consistent with RV pacemaker. Right Ventricle: The right ventricular size is normal. No increase in right ventricular wall thickness. Right ventricular systolic function is normal. There is normal pulmonary artery systolic pressure. The tricuspid regurgitant velocity is 2.20 m/s, and  with an assumed right atrial pressure of 3 mmHg, the estimated right ventricular systolic pressure is 56.9 mmHg. Left Atrium: Left atrial size was normal in size. Right Atrium: Right atrial size was normal in size. Pericardium: There is no evidence of pericardial effusion. Mitral Valve: The mitral valve is normal in structure. No evidence of mitral valve stenosis. Tricuspid Valve: The tricuspid valve is normal in structure.  Aortic  Valve: The aortic valve is grossly normal. Pulmonic Valve: The pulmonic valve was not well visualized. Aorta: The aortic root and ascending aorta are structurally normal, with no evidence of dilitation. IAS/Shunts: No atrial level shunt detected by color flow Doppler. LEFT VENTRICLE PLAX 2D LVIDd:         4.65 cm LVIDs:         2.60 cm LV PW:         1.30 cm LV IVS:        1.20 cm LVOT diam:     1.80 cm LVOT Area:     2.54 cm  LEFT ATRIUM           Index LA diam:      2.90 cm 1.64 cm/m LA Vol (A4C): 55.5 ml 31.32 ml/m                        PULMONIC VALVE AORTA                 PV Vmax:       1.08 m/s Ao Root diam: 2.80 cm PV Peak grad:  4.7 mmHg Ao Asc diam:  3.20 cm  TRICUSPID VALVE TR Peak grad:   19.4 mmHg TR Vmax:        220.00 cm/s  SHUNTS Systemic Diam: 1.80 cm Dani Gobble Croitoru MD Electronically signed by Sanda Klein MD Signature Date/Time: 04/01/2022/3:51:10 PM    Final    CT Angio Chest PE W and/or Wo Contrast  Result Date: 04/01/2022 CLINICAL DATA:  Pulmonary embolism (PE) suspected, high prob EXAM: CT ANGIOGRAPHY CHEST WITH CONTRAST TECHNIQUE: Multidetector CT imaging of the chest was performed using the standard protocol during bolus administration of intravenous contrast. Multiplanar CT image reconstructions and MIPs were obtained to evaluate the vascular anatomy. RADIATION DOSE REDUCTION: This exam was performed according to the departmental dose-optimization program which includes automated exposure control, adjustment of the mA and/or kV according to patient size and/or use of iterative reconstruction technique. CONTRAST:  176m OMNIPAQUE IOHEXOL 350 MG/ML SOLN COMPARISON:  February 20, 2013 FINDINGS: Cardiovascular: Satisfactory opacification of the pulmonary arteries to the segmental level. No evidence of pulmonary embolism. Main pulmonary artery measures 3 cm in greatest diameter. Normal heart size. No pericardial effusion. Mediastinum/Nodes: No enlarged mediastinal, hilar, or axillary  lymph nodes. Thyroid gland, trachea, and esophagus demonstrate no significant findings. Lungs/Pleura: Lungs are clear. No pleural effusion or pneumothorax. Upper Abdomen: No acute abnormality. Musculoskeletal: No chest wall abnormality. No acute or significant osseous findings. Review of the MIP images confirms the above findings. IMPRESSION: Unremarkable CT angiogram of the chest except for mild enlargement of the main pulmonary artery measuring 3 cm in greatest dimension which may reflect pulmonary hypertension. No PE seen. Lungs are clear. Electronically Signed   By: AFrazier RichardsM.D.   On: 04/01/2022 12:03   CT ABDOMEN PELVIS W CONTRAST  Result Date: 04/01/2022 CLINICAL DATA:  71year old female with concern retroperitoneal hematoma. EXAM: CT ABDOMEN AND PELVIS WITH CONTRAST TECHNIQUE: Multidetector CT imaging of the abdomen and pelvis was performed using the standard protocol following bolus administration of intravenous contrast. RADIATION DOSE REDUCTION: This exam was performed according to the departmental dose-optimization program which includes automated exposure control, adjustment of the mA and/or kV according to patient size and/or use of iterative reconstruction technique. CONTRAST:  1065mOMNIPAQUE IOHEXOL 350 MG/ML SOLN COMPARISON:  None Available. FINDINGS: Lower chest: Please refer to the dedicated chest CT from the same day  for intra thoracic findings. Hepatobiliary: No focal liver abnormality is seen. Status post cholecystectomy. No biliary dilatation. Pancreas: Unremarkable. No pancreatic ductal dilatation or surrounding inflammatory changes. Spleen: Normal in size without focal abnormality. Adrenals/Urinary Tract: Adrenal glands are unremarkable. Kidneys are normal, without renal calculi, focal lesion, or hydronephrosis. Bladder is unremarkable. Stomach/Bowel: Stomach is within normal limits. Appendix is normal. No evidence of bowel wall thickening, distention, or inflammatory changes.  Vascular/Lymphatic: Aortic atherosclerosis. No enlarged abdominal or pelvic lymph nodes. Reproductive: Status post hysterectomy. No adnexal masses. Other: No abdominal wall hernia or abnormality. No abdominopelvic ascites. Musculoskeletal: Mild multilevel degenerative changes of visualized lumbar spine, most pronounced at L5-S1. No acute osseous abnormality. IMPRESSION: 1. No evidence of retroperitoneal hematoma, as queried. 2. No acute abdominopelvic abnormality. Ruthann Cancer, MD [facility] Radiology Electronically Signed   By: Ruthann Cancer M.D.   On: 04/01/2022 11:59   DG Chest Portable 1 View  Result Date: 04/01/2022 CLINICAL DATA:  Provided history: Near-syncope. EXAM: PORTABLE CHEST 1 VIEW COMPARISON:  Prior chest radiographs 03/25/2022 and earlier. FINDINGS: Left chest multi-lead implantable cardiac device, unchanged in appearance from the prior chest radiograph of 03/25/2022. Heart size within normal limits. No appreciable airspace consolidation or pulmonary edema. No evidence of pleural effusion or pneumothorax. No acute bony abnormality identified. IMPRESSION: No evidence of acute cardiopulmonary abnormality. Electronically Signed   By: Kellie Simmering D.O.   On: 04/01/2022 10:25   US Venous Img Upper Left (DVT Study)  Result Date: 03/26/2022 CLINICAL DATA:  Recent pacemaker EXAM: LEFT UPPER EXTREMITY VENOUS DOPPLER ULTRASOUND TECHNIQUE: Gray-scale sonography with graded compression, as well as color Doppler and duplex ultrasound were performed to evaluate the upper extremity deep venous system from the level of the subclavian vein and including the jugular, axillary, basilic, radial, ulnar and upper cephalic vein. Spectral Doppler was utilized to evaluate flow at rest and with distal augmentation maneuvers. COMPARISON:  None Available. FINDINGS: Contralateral Subclavian Vein: Respiratory phasicity is normal and symmetric with the symptomatic side. No  evidence of thrombus. Normal compressibility. Internal Jugular Vein: No evidence of thrombus. Normal compressibility, respiratory phasicity and response to augmentation. Subclavian Vein: Occlusive thrombus. Axillary Vein: Occlusive thrombus. Cephalic Vein: Occlusive thrombus. Basilic Vein: Occlusive thrombus. Brachial Veins: No evidence of thrombus. Normal compressibility, respiratory phasicity and response to augmentation. Radial Veins: No evidence of thrombus. Normal compressibility, respiratory phasicity and response to augmentation. Ulnar Veins: No evidence of thrombus. Normal compressibility, respiratory phasicity and response to augmentation. Venous Reflux:  None visualized. Other Findings:  None visualized. IMPRESSION: Positive examination for deep venous thrombosis in the left upper extremity with occlusive thrombus in the left subclavian and axillary veins. Additional superficial thrombosis of the cephalic and basilic veins. These results will be called to the ordering clinician or representative by the Radiologist Assistant, and communication documented in the PACS or [employer]. Electronically Signed   By: Delanna Ahmadi M.D.   On: 03/26/2022 16:39   DG Chest 2 View  Result Date: 03/25/2022 CLINICAL DATA:  Pacemaker. EXAM: CHEST - 2 VIEW COMPARISON:  Chest x-ray dated April 11, 2019. FINDINGS: Interval pacemaker revision with new pacemaker generator and right atrial and right ventricular leads. The leads are more proximal in location compared to the prior leads. The heart size and mediastinal contours are within normal limits. Normal pulmonary vascularity. Mild linear atelectasis in the medial left lower lobe. No focal consolidation, pleural effusion, or pneumothorax. No acute osseous abnormality. IMPRESSION: 1. Interval pacemaker revision. Right atrial  and ventricular leads are more proximal in location compared to the prior leads. Correlate with procedure report and pacemaker function. 2. No  acute cardiopulmonary disease. Electronically Signed   By: Titus Dubin M.D.   On: 03/25/2022 09:01   ECHOCARDIOGRAM COMPLETE  Result Date: 03/24/2022    ECHOCARDIOGRAM REPORT   Patient Name:   LEANNDRA PEMBER Date of Exam: 03/24/2022 Medical Rec #:  [id number]           Height:       68.0 in Accession #:    [id number]          Weight:       167.1 lb Date of Birth:  13-May-1951            BSA:          1.893 m Patient Age:    77 years            BP:           107/63 mmHg Patient Gender: F                   HR:           60 bpm. Exam Location:  Inpatient Procedure: 2D Echo, Cardiac Doppler and Color Doppler                        STAT ECHO Reported to: Dr Carlyle Dolly on 03/24/2022 8:20:00 PM. Indications:    Abnormal ECG  History:        Patient has prior history of Echocardiogram examinations. GERD.                 S/P lead extraction 03/24/22.  Sonographer:    Clayton Lefort RDCS (AE) Referring Phys: [place] Comments: Limited range of motion for left arm, limiting imaging windows. IMPRESSIONS  1. Left ventricular ejection fraction, by estimation, is 65 to 70%. The left ventricle has normal function. The left ventricle has no regional wall motion abnormalities. There is mild left ventricular hypertrophy. Left ventricular diastolic parameters are indeterminate.  2. Right ventricular systolic function is normal. The right ventricular size is normal.  3. The mitral valve is normal in structure. No evidence of mitral valve regurgitation. No evidence of mitral stenosis.  4. The aortic valve has an indeterminant number of cusps. Aortic valve regurgitation is not visualized. No aortic stenosis is present.  5. IVC is small suggesting low RA pressure and hypovolemia. FINDINGS  Left Ventricle: Left ventricular ejection fraction, by estimation, is 65 to 70%. The left ventricle has normal function. The left ventricle has no regional wall motion abnormalities. The left ventricular internal cavity  size was normal in size. There is  mild left ventricular hypertrophy. Left ventricular diastolic parameters are indeterminate. Right Ventricle: The right ventricular size is normal. Right vetricular wall thickness was not well visualized. Right ventricular systolic function is normal. Left Atrium: Left atrial size was normal in size. Right Atrium: Right atrial size was normal in size. Pericardium: There is no evidence of pericardial effusion. Mitral Valve: The mitral valve is normal in structure. No evidence of mitral valve regurgitation. No evidence of mitral valve stenosis. Tricuspid Valve: The tricuspid valve is normal in structure. Tricuspid valve regurgitation is not demonstrated. No evidence of tricuspid stenosis. Aortic Valve: The aortic valve has an indeterminant number of cusps. Aortic valve regurgitation is not visualized. No aortic stenosis is present. Aortic valve mean gradient measures  3.2 mmHg. Aortic valve peak gradient measures 6.7 mmHg. Aortic valve area, by VTI measures 2.68 cm. Pulmonic Valve: The pulmonic valve was not well visualized. Pulmonic valve regurgitation is not visualized. No evidence of pulmonic stenosis. Aorta: The aortic root is normal in size and structure. Venous: IVC is small suggesting low RA pressure and hypovolemia. IAS/Shunts: The interatrial septum was not well visualized.  LEFT VENTRICLE PLAX 2D LVIDd:         3.80 cm   Diastology LV PW:         1.10 cm   LV e' medial:    6.42 cm/s LV IVS:        1.20 cm   LV E/e' medial:  12.0 LVOT diam:     1.90 cm   LV e' lateral:   6.64 cm/s LV SV:         63        LV E/e' lateral: 11.6 LV SV Index:   33 LVOT Area:     2.84 cm  RIGHT VENTRICLE             IVC RV Basal diam:  2.70 cm     IVC diam: 1.10 cm RV S prime:     16.00 cm/s TAPSE (M-mode): 3.0 cm LEFT ATRIUM           Index        RIGHT ATRIUM          Index LA Vol (A2C): 27.5 ml 14.52 ml/m  RA Area:     9.86 cm LA Vol (A4C): 35.5 ml 18.75 ml/m  RA Volume:   20.40 ml 10.77  ml/m  AORTIC VALVE AV Area (Vmax):    2.56 cm AV Area (Vmean):   2.52 cm AV Area (VTI):     2.68 cm AV Vmax:           129.78 cm/s AV Vmean:          81.178 cm/s AV VTI:            0.235 m AV Peak Grad:      6.7 mmHg AV Mean Grad:      3.2 mmHg LVOT Vmax:         117.00 cm/s LVOT Vmean:        72.100 cm/s LVOT VTI:          0.222 m LVOT/AV VTI ratio: 0.94  AORTA Ao Root diam: 3.70 cm MITRAL VALVE MV Area (PHT): 2.37 cm    SHUNTS MV Decel Time: 320 msec    Systemic VTI:  0.22 m MV E velocity: 76.80 cm/s  Systemic Diam: 1.90 cm MV A velocity: 46.90 cm/s MV E/A ratio:  1.64 Carlyle Dolly MD Electronically signed by Carlyle Dolly MD Signature Date/Time: 03/24/2022/8:25:35 PM    Final    EP PPM/ICD IMPLANT  Result Date: 03/24/2022 Conclusion: Successful removal of a previously implanted dual-chamber pacemaker secondary to failure to output due to ventricular pacing noise, followed by successful insertion of a [employer] dual-chamber pacemaker in a patient with complete heart block.  Because the patient's old pacemaker generator was 2 years from ERI, a new dual-chamber pacemaker was inserted as well. Cristopher Peru, MD   CT HEAD CODE STROKE WO CONTRAST  Result Date: 03/20/2022 CLINICAL DATA:  Code stroke. Initial evaluation for acute stroke, left arm weakness. EXAM: CT HEAD WITHOUT CONTRAST TECHNIQUE: Contiguous axial images were obtained from the base of the skull through the vertex without intravenous contrast. RADIATION  DOSE REDUCTION: This exam was performed according to the departmental dose-optimization program which includes automated exposure control, adjustment of the mA and/or kV according to patient size and/or use of iterative reconstruction technique. COMPARISON:  Prior study from 01/30/2018. FINDINGS: Brain: Cerebral volume within normal limits for patient age. No evidence for acute intracranial hemorrhage. No findings to suggest acute large vessel territory infarct. No mass lesion,  midline shift, or mass effect. Ventricles are normal in size without evidence for hydrocephalus. No extra-axial fluid collection identified. Vascular: No hyperdense vessel identified. Skull: Scalp soft tissues demonstrate no acute abnormality. Calvarium intact. Sinuses/Orbits: Globes and orbital soft tissues within normal limits. Visualized paranasal sinuses are clear. No mastoid effusion. ASPECTS [facility] Stroke Program Early CT Score) - Ganglionic level infarction (caudate, lentiform nuclei, internal capsule, insula, M1-M3 cortex): 7 - Supraganglionic infarction (M4-M6 cortex): 3 Total score (0-10 with 10 being normal): 10 IMPRESSION: 1. Normal head CT.  No acute intracranial abnormality. 2. ASPECTS is 10. Critical Value/emergent results were called by telephone at the time of interpretation on 03/20/2022 at 2:56 am to provider [facility] , who verbally acknowledged these results. Electronically Signed   By: Jeannine Boga M.D.   On: 03/20/2022 02:57   XR HIP UNILAT W OR W/O PELVIS 1V LEFT  Result Date: 03/17/2022 An AP pelvis and lateral left hip shows no acute gross findings.  There is just slight signs of femoral acetabular impingement with a superior lateral bone spur off the femoral head which is present on the right hip.  There is also a small osteophyte just superior and posterior to the acetabulum.  The joint space itself is congruent.  DG Cervical Spine Complete  Result Date: 03/11/2022 CLINICAL DATA:  Pain EXAM: CERVICAL SPINE - COMPLETE 4 VIEW COMPARISON:  Cervical spine radiograph dated March 02, 2018 FINDINGS: Vertebral body heights are well-maintained. Moderate multilevel degenerative disc disease, most pronounced at C5-C6 and C6-C7 unchanged when compared to prior. Minimal grade 1 anterolisthesis of C7 on T1, unchanged when compared to prior exam and likely degenerative. Moderate to severe multilevel facet arthropathy, unchanged when compared with prior exam. Dens is intact on  open-mouth view. Soft tissues are unremarkable. IMPRESSION: 1. No acute osseous abnormality. 2. Degenerative changes of the cervical spine, similar prior exam. Electronically Signed   By: Yetta Glassman M.D.   On: 03/11/2022 15:33    Microbiology: Results for orders placed or performed during the hospital encounter of 03/20/22  Resp Panel by RT-PCR (Flu A&B, Covid) Anterior Nasal Swab     Status: None   Collection Time: 03/20/22  3:16 AM   Specimen: Anterior Nasal Swab  Result Value Ref Range Status   SARS Coronavirus 2 by RT PCR NEGATIVE NEGATIVE Final    Comment: (NOTE) SARS-CoV-2 target nucleic acids are NOT DETECTED.  The SARS-CoV-2 RNA is generally detectable in upper respiratory specimens during the acute phase of infection. The lowest concentration of SARS-CoV-2 viral copies this assay can detect is 138 copies/mL. A negative result does not preclude SARS-Cov-2 infection and should not be used as the sole basis for treatment or other patient management decisions. A negative result may occur with  improper specimen collection/handling, submission of specimen other than nasopharyngeal swab, presence of viral mutation(s) within the areas targeted by this assay, and inadequate number of viral copies(<138 copies/mL). A negative result must be combined with clinical observations, patient history, and epidemiological information. The expected result is Negative.  Fact Sheet for Patients:  EntrepreneurPulse.com.au  Fact Sheet for  Healthcare Providers:  IncredibleEmployment.be  This test is no t yet approved or cleared by the [place] and  has been authorized for detection and/or diagnosis of SARS-CoV-2 by FDA under an Emergency Use Authorization (EUA). This EUA will remain  in effect (meaning this test can be used) for the duration of the COVID-19 declaration under Section 564(b)(1) of the Act, 21 U.S.C.section 360bbb-3(b)(1), unless the  authorization is terminated  or revoked sooner.       Influenza A by PCR NEGATIVE NEGATIVE Final   Influenza B by PCR NEGATIVE NEGATIVE Final    Comment: (NOTE) The Xpert Xpress SARS-CoV-2/FLU/RSV plus assay is intended as an aid in the diagnosis of influenza from Nasopharyngeal swab specimens and should not be used as a sole basis for treatment. Nasal washings and aspirates are unacceptable for Xpert Xpress SARS-CoV-2/FLU/RSV testing.  Fact Sheet for Patients: EntrepreneurPulse.com.au  Fact Sheet for Healthcare Providers: IncredibleEmployment.be  This test is not yet approved or cleared by the [place] FDA and has been authorized for detection and/or diagnosis of SARS-CoV-2 by FDA under an Emergency Use Authorization (EUA). This EUA will remain in effect (meaning this test can be used) for the duration of the COVID-19 declaration under Section 564(b)(1) of the Act, 21 U.S.C. section 360bbb-3(b)(1), unless the authorization is terminated or revoked.  Performed at [facility], [address] [place], [place] [address]   MRSA Next Gen by PCR, Nasal     Status: None   Collection Time: 03/20/22 11:01 AM   Specimen: Nasal Mucosa; Nasal Swab  Result Value Ref Range Status   MRSA by PCR Next Gen NOT DETECTED NOT DETECTED Final    Comment: (NOTE) The GeneXpert MRSA Assay (FDA approved for NASAL specimens only), is one component of a comprehensive MRSA colonization surveillance program. It is not intended to diagnose MRSA infection nor to guide or monitor treatment for MRSA infections. Test performance is not FDA approved in patients less than 57 years old. Performed at [facility], [place] [address] [place], [place] [address]     Labs: CBC: Recent Labs  Lab 03/26/22 1509 04/01/22 1012 04/01/22 1034 04/01/22 1500 04/01/22 2138 04/02/22 0130  WBC 9.1 5.9  --  5.3 6.2 5.9  NEUTROABS 5.6 4.4  --   --   --   --    HGB 9.3* 8.8* 7.8* 8.6* 9.0* 8.6*  HCT 28.3* 26.5* 23.0* 26.8* 27.3* 25.5*  MCV 95.0 95.3  --  96.1 94.1 93.8  PLT 159 312  --  329 330 944   Basic Metabolic Panel: Recent Labs  Lab 03/26/22 1509 04/01/22 1012 04/01/22 1034 04/02/22 0130  NA 137 137 138 139  K 3.4* 3.5 3.6 3.7  CL 105 104 103 105  CO2 26 25  --  25  GLUCOSE 107* 139* 136* 104*  BUN '11 10 9 9  '$ CREATININE 0.78 0.79 0.70 0.71  CALCIUM 8.9 8.8*  --  8.9   Liver Function Tests: Recent Labs  Lab 04/01/22 1012  AST 18  ALT 15  ALKPHOS 56  BILITOT 0.9  PROT 6.6  ALBUMIN 3.1*   CBG: No results for input(s): "GLUCAP" in the last 168 hours.  Discharge time spent: less than 30 minutes.  Signed: Marylu Lund, MD [facility] 04/02/2022

## 2022-04-02 NOTE — Progress Notes (Signed)
Electrophysiology Rounding Note  Patient Name: Kari [name] Date of Encounter: 04/02/2022  Primary Cardiologist: Cristopher Peru, MD Electrophysiologist: Dr. Lovena Le   Subjective   NAEO.   Overall she is feeling better. She is very relieved no significant acute issue has been found. Some distress over needing [place] for 3-6 months.  Inpatient Medications    Scheduled Meds:  famotidine  40 mg Oral BH-q7a   Rivaroxaban  15 mg Oral BID WC   Followed by   Derrill Memo ON 04/22/2022] rivaroxaban  20 mg Oral Q supper   rosuvastatin  5 mg Oral Daily   sodium chloride flush  3 mL Intravenous Q12H   Continuous Infusions:  sodium chloride     PRN Meds: sodium chloride, acetaminophen **OR** acetaminophen, oxyCODONE, sodium chloride flush   Vital Signs    Vitals:   04/02/22 0400 04/02/22 0500 04/02/22 0600 04/02/22 0652  BP: 106/73 111/61 121/64 113/63  Pulse: 64 (!) 59 (!) 59 (!) 58  Resp: '17 15 17 14  '$ Temp:      TempSrc:      SpO2: 96% 95% 95% 94%    Intake/Output Summary (Last 24 hours) at 04/02/2022 0729 Last data filed at 04/01/2022 1506 Gross per 24 hour  Intake 3 ml  Output --  Net 3 ml   There were no vitals filed for this visit.  Physical Exam    GEN- The patient is well appearing, alert and oriented x 3 today.   Head- normocephalic, atraumatic Eyes-  Sclera clear, conjunctiva pink Ears- hearing intact Oropharynx- clear Neck- supple Lungs- Clear to ausculation bilaterally, normal work of breathing Heart- Regular rate and rhythm, no murmurs, rubs or gallops GI- soft, NT, ND, + BS Extremities- no clubbing or cyanosis. No edema Skin- no rash or lesion Psych- euthymic mood, full affect Neuro- strength and sensation are intact  Labs    CBC Recent Labs    04/01/22 1012 04/01/22 1034 04/01/22 2138 04/02/22 0130  WBC 5.9   < > 6.2 5.9  NEUTROABS 4.4  --   --   --   HGB 8.8*   < > 9.0* 8.6*  HCT 26.5*   < > 27.3* 25.5*  MCV 95.3   < > 94.1 93.8  PLT 312    < > 330 311   < > = values in this interval not displayed.   Basic Metabolic Panel Recent Labs    04/01/22 1012 04/01/22 1034 04/02/22 0130  NA 137 138 139  K 3.5 3.6 3.7  CL 104 103 105  CO2 25  --  25  GLUCOSE 139* 136* 104*  BUN '10 9 9  '$ CREATININE 0.79 0.70 0.71  CALCIUM 8.8*  --  8.9   Liver Function Tests Recent Labs    04/01/22 1012  AST 18  ALT 15  ALKPHOS 56  BILITOT 0.9  PROT 6.6  ALBUMIN 3.1*   No results for input(s): "LIPASE", "AMYLASE" in the last 72 hours. Cardiac Enzymes No results for input(s): "CKTOTAL", "CKMB", "CKMBINDEX", "TROPONINI" in the last 72 hours.   Telemetry    V paced, with intermittent A pacing 60-70s (personally reviewed)  Radiology    ECHOCARDIOGRAM LIMITED  Result Date: 04/01/2022    ECHOCARDIOGRAM LIMITED REPORT   Patient Name:   Kari [name] Date of Exam: 04/01/2022 Medical Rec #:  [id number]           Height:       68.0 in Accession #:    [id number]  Weight:       143.0 lb Date of Birth:  05-24-1951            BSA:          1.772 m Patient Age:    71 years            BP:           120/63 mmHg Patient Gender: F                   HR:           64 bpm. Exam Location:  Inpatient Procedure: Limited Echo Indications:    Pacemaker  History:        Patient has prior history of Echocardiogram examinations, most                 recent 03/25/2022.  Sonographer:    Jefferey Pica Referring Phys: [id number] [place]  1. Left ventricular ejection fraction, by estimation, is 60 to 65%. The left ventricle has normal function. The left ventricle has no regional wall motion abnormalities.  2. Right ventricular systolic function is normal. The right ventricular size is normal. There is normal pulmonary artery systolic pressure. The estimated right ventricular systolic pressure is 35.3 mmHg.  3. The mitral valve is normal in structure. No evidence of mitral stenosis.  4. The aortic valve is grossly normal. FINDINGS  Left  Ventricle: Left ventricular ejection fraction, by estimation, is 60 to 65%. The left ventricle has normal function. The left ventricle has no regional wall motion abnormalities. The left ventricular internal cavity size was normal in size. There is  borderline concentric left ventricular hypertrophy. Abnormal (paradoxical) septal motion, consistent with RV pacemaker. Right Ventricle: The right ventricular size is normal. No increase in right ventricular wall thickness. Right ventricular systolic function is normal. There is normal pulmonary artery systolic pressure. The tricuspid regurgitant velocity is 2.20 m/s, and  with an assumed right atrial pressure of 3 mmHg, the estimated right ventricular systolic pressure is 29.9 mmHg. Left Atrium: Left atrial size was normal in size. Right Atrium: Right atrial size was normal in size. Pericardium: There is no evidence of pericardial effusion. Mitral Valve: The mitral valve is normal in structure. No evidence of mitral valve stenosis. Tricuspid Valve: The tricuspid valve is normal in structure. Aortic Valve: The aortic valve is grossly normal. Pulmonic Valve: The pulmonic valve was not well visualized. Aorta: The aortic root and ascending aorta are structurally normal, with no evidence of dilitation. IAS/Shunts: No atrial level shunt detected by color flow Doppler. LEFT VENTRICLE PLAX 2D LVIDd:         4.65 cm LVIDs:         2.60 cm LV PW:         1.30 cm LV IVS:        1.20 cm LVOT diam:     1.80 cm LVOT Area:     2.54 cm  LEFT ATRIUM           Index LA diam:      2.90 cm 1.64 cm/m LA Vol (A4C): 55.5 ml 31.32 ml/m                        PULMONIC VALVE AORTA                 PV Vmax:       1.08 m/s Ao Root diam: 2.80 cm PV  Peak grad:  4.7 mmHg Ao Asc diam:  3.20 cm  TRICUSPID VALVE TR Peak grad:   19.4 mmHg TR Vmax:        220.00 cm/s  SHUNTS Systemic Diam: 1.80 cm Dani Gobble Croitoru MD Electronically signed by Sanda Klein MD Signature Date/Time: 04/01/2022/3:51:10 PM     Final    CT Angio Chest PE W and/or Wo Contrast  Result Date: 04/01/2022 CLINICAL DATA:  Pulmonary embolism (PE) suspected, high prob EXAM: CT ANGIOGRAPHY CHEST WITH CONTRAST TECHNIQUE: Multidetector CT imaging of the chest was performed using the standard protocol during bolus administration of intravenous contrast. Multiplanar CT image reconstructions and MIPs were obtained to evaluate the vascular anatomy. RADIATION DOSE REDUCTION: This exam was performed according to the departmental dose-optimization program which includes automated exposure control, adjustment of the mA and/or kV according to patient size and/or use of iterative reconstruction technique. CONTRAST:  12m OMNIPAQUE IOHEXOL 350 MG/ML SOLN COMPARISON:  February 20, 2013 FINDINGS: Cardiovascular: Satisfactory opacification of the pulmonary arteries to the segmental level. No evidence of pulmonary embolism. Main pulmonary artery measures 3 cm in greatest diameter. Normal heart size. No pericardial effusion. Mediastinum/Nodes: No enlarged mediastinal, hilar, or axillary lymph nodes. Thyroid gland, trachea, and esophagus demonstrate no significant findings. Lungs/Pleura: Lungs are clear. No pleural effusion or pneumothorax. Upper Abdomen: No acute abnormality. Musculoskeletal: No chest wall abnormality. No acute or significant osseous findings. Review of the MIP images confirms the above findings. IMPRESSION: Unremarkable CT angiogram of the chest except for mild enlargement of the main pulmonary artery measuring 3 cm in greatest dimension which may reflect pulmonary hypertension. No PE seen. Lungs are clear. Electronically Signed   By: AFrazier RichardsM.D.   On: 04/01/2022 12:03   CT ABDOMEN PELVIS W CONTRAST  Result Date: 04/01/2022 CLINICAL DATA:  71year old female with concern retroperitoneal hematoma. EXAM: CT ABDOMEN AND PELVIS WITH CONTRAST TECHNIQUE: Multidetector CT imaging of the abdomen and pelvis was performed using the standard protocol  following bolus administration of intravenous contrast. RADIATION DOSE REDUCTION: This exam was performed according to the departmental dose-optimization program which includes automated exposure control, adjustment of the mA and/or kV according to patient size and/or use of iterative reconstruction technique. CONTRAST:  1022mOMNIPAQUE IOHEXOL 350 MG/ML SOLN COMPARISON:  None Available. FINDINGS: Lower chest: Please refer to the dedicated chest CT from the same day for intra thoracic findings. Hepatobiliary: No focal liver abnormality is seen. Status post cholecystectomy. No biliary dilatation. Pancreas: Unremarkable. No pancreatic ductal dilatation or surrounding inflammatory changes. Spleen: Normal in size without focal abnormality. Adrenals/Urinary Tract: Adrenal glands are unremarkable. Kidneys are normal, without renal calculi, focal lesion, or hydronephrosis. Bladder is unremarkable. Stomach/Bowel: Stomach is within normal limits. Appendix is normal. No evidence of bowel wall thickening, distention, or inflammatory changes. Vascular/Lymphatic: Aortic atherosclerosis. No enlarged abdominal or pelvic lymph nodes. Reproductive: Status post hysterectomy. No adnexal masses. Other: No abdominal wall hernia or abnormality. No abdominopelvic ascites. Musculoskeletal: Mild multilevel degenerative changes of visualized lumbar spine, most pronounced at L5-S1. No acute osseous abnormality. IMPRESSION: 1. No evidence of retroperitoneal hematoma, as queried. 2. No acute abdominopelvic abnormality. DyRuthann CancerMD [facility] Electronically Signed   By: DyRuthann Cancer.D.   On: 04/01/2022 11:59   DG Chest Portable 1 View  Result Date: 04/01/2022 CLINICAL DATA:  Provided history: Near-syncope. EXAM: PORTABLE CHEST 1 VIEW COMPARISON:  Prior chest radiographs 03/25/2022 and earlier. FINDINGS: Left chest multi-lead implantable cardiac device, unchanged in appearance  from the prior chest radiograph of 03/25/2022. Heart size within normal limits. No appreciable airspace consolidation or pulmonary edema. No evidence of pleural effusion or pneumothorax. No acute bony abnormality identified. IMPRESSION: No evidence of acute cardiopulmonary abnormality. Electronically Signed   By: Kellie Simmering D.O.   On: 04/01/2022 10:25    Patient Profile     YARED SUSAN is a 71 y.o. female with a history of CHB who is being seen today for the evaluation of fatigue, weakness, and malaise at the request of Dr. Regenia Skeeter given recent pacemaker procedure.  Assessment & Plan    1. Malaise/lightheadedness  2. Anemia ? 2/2 to anemia with Hgb 12.8 on 7/31  8.6 today. No active bleeding.  CT chest and abdomen WNL CTA PE negative for PE.  She is now switched to Xarelto. Will need PCP follow up to make sure Hgb trends back in right direction.  Overall there is a concern that she had a somatic reaction to Eliquis. Symptoms have resolved with switch to Xarelto, despite persistent anemia.    3. Superficial LUE DVTs Started on treatment dose eliquis -> Now transitioned to Xarelto.  Will likely need at least 3-6 month course with provoked   4.  CHB s/p extraction and re-implant with lead malfunction Device interrogation and CXR stable.  Chest CT and limited echo also stable. No pericardial effusion or evidence of device malfunction.   She is OK for discharge from an EP perspective. Needs PCP follow up for ongoing management of anemia.      Normal device interrogation and function. She can keep her November follow up with EP. Does not need acute follow up post this admission.   For questions or updates, please contact [place] Please consult [email or web address] for contact info under Cardiology/STEMI.  Signed, Shirley Friar, PA-C  04/02/2022, 7:29 AM

## 2022-04-02 NOTE — Hospital Course (Signed)
71 y.o. 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 EMS was called.    She took eliquis for the first time on Saturday and had a feeling of euphoria in the head and the arm with the clot. On Sunday and Monday she had no symptoms. Today she got up and was fine. She was doing some things around the house, took her medicine and then started to feel bad with same feeling she had on Saturday, but this time over her entire body. She had no energy, started to feel really weak, became diaphoretic, tachycardic and felt like "doom." She hasn't had another wave of these feelings in the last 1-2 hours. She states all of these feelings are completely abnormal. She has not been lightheaded or dizzy. NO chest pain or palpitations.    Admitted to [facility] by Dr. Curt Bears on 7/27 due to pacemaker malfunction. Pacer interrogation revealed inhibition due to ventricular lead noise leading to HRs in the 30-40s. Pt was programmed from DDD 50 to DOO 70 with relief of symptoms. She was then admitted on 7/31 for PPM extraction and re-implanted by Dr. Lovena Le with usual post op care. On 8/2 seen in ED for left upper extremity edema. Found to have Positive examination for deep venous thrombosis in the left upper extremity with occlusive thrombus in the left subclavian and axillary veins. Additional superficial thrombosis of the cephalic and basilic veins. Discussed with Dr. Lovena Le who recommended starting eliquis Saturday due to risk of bleeding from PPM placement. She started this on 8/5 and was supposed to f/u on Monday with Dr. Lovena Le.      He has been feeling good. Denies any fever/chills, vision changes/headaches, chest pain or palpitations, shortness of breath or cough, abdominal pain, N/V/D, dysuria or leg swelling. Her chest is sore where she had the PPM  extraction, but no redness, drainage from the site.    She denies any bleeding. Denies bloody or dark stools, no vaginal bleeding, no bruising. Colonoscopy was 2019, recall q 5 years. Hx of diverticulitis/losis.    She does not smoke or drink.

## 2022-04-02 NOTE — ED Notes (Signed)
Admitting MD at [facility]. Pt alert, NAD, calm, interactive. No changes.

## 2022-04-02 NOTE — Telephone Encounter (Signed)
Spoke with pt who states she is waiting on a copy of Physician statement that has been faxed to [employer].  Pt advised it can take up to 10 days for mail to be received from the office but RN will do some further investigation to ensure this has been completed and mailed to pt.  Pt thanked Therapist, sports for the callback.

## 2022-04-03 NOTE — Telephone Encounter (Signed)
\   See note below regarding FMLA paperwork being mailed to the pt.       03/26/22  8:05 AM Note Spoke with the patient and advised her that we have put a copy of her FMLA paperwork in the mail. Antonieta Iba, RN

## 2022-04-09 ENCOUNTER — Encounter: Payer: Self-pay | Admitting: Surgery

## 2022-04-09 ENCOUNTER — Ambulatory Visit (INDEPENDENT_AMBULATORY_CARE_PROVIDER_SITE_OTHER): Payer: 59

## 2022-04-09 ENCOUNTER — Telehealth: Payer: Self-pay

## 2022-04-09 DIAGNOSIS — I441 Atrioventricular block, second degree: Secondary | ICD-10-CM | POA: Diagnosis not present

## 2022-04-09 NOTE — Progress Notes (Signed)
TCTS:   I provided surgical backup for pacemaker lead extraction by Dr. Lovena Le on 03/24/2022 for 114 minutes.

## 2022-04-09 NOTE — Telephone Encounter (Signed)
While patient was in for wound check post PM implant and lead revision for CHB, she asks the following of Dr. Lovena Le:   Wound check was WNL - steri strips removed, area healing.  She is still under lift, push, pull restrictions until September 13th.  As a result she will need her leave from work extended from the initial September 1st return work date that was put on her [employer] paperwork originally.   2.  Patient is concerned about the "loss of blood" and low Hgb that occurred during her procedure and last hospitalization.  She would like to know why we think that may have occurred AND if she should have her Hgb rechecked at this point as she is very concerned.  Patient would like a call to follow up with above questions.   I will CC: Merrilee Seashore (Dr. Tanna Furry RN) on this communication as well for follow up.

## 2022-04-09 NOTE — Progress Notes (Signed)

## 2022-04-09 NOTE — Patient Instructions (Signed)
   After Your Pacemaker   Monitor your pacemaker site for redness, swelling, and drainage. Call the device clinic at [phone] if you experience these symptoms or fever/chills.  Your incision was closed with Steri-strips or staples:  You may shower 7 days after your procedure and wash your incision with soap and water. Avoid lotions, ointments, or perfumes over your incision until it is well-healed.  You may use a hot tub or a pool after your wound check appointment if the incision is completely closed.  Do not lift, push or pull greater than 10 pounds with the affected arm until 6 weeks after your procedure. There are no other restrictions in arm movement after your wound check appointment. UNTIL AFTER SEPTEMBER 13.   You may drive, unless driving has been restricted by your healthcare providers.   Remote monitoring is used to monitor your pacemaker from home. This monitoring is scheduled every 91 days by our office. It allows [place] to keep an eye on the functioning of your device to ensure it is working properly. You will routinely see your Electrophysiologist annually (more often if necessary).

## 2022-04-10 ENCOUNTER — Telehealth (HOSPITAL_BASED_OUTPATIENT_CLINIC_OR_DEPARTMENT_OTHER): Payer: Self-pay

## 2022-04-10 ENCOUNTER — Other Ambulatory Visit: Payer: 59

## 2022-04-10 ENCOUNTER — Other Ambulatory Visit (HOSPITAL_BASED_OUTPATIENT_CLINIC_OR_DEPARTMENT_OTHER): Payer: Self-pay

## 2022-04-10 ENCOUNTER — Telehealth: Payer: Self-pay | Admitting: Internal Medicine

## 2022-04-10 DIAGNOSIS — I441 Atrioventricular block, second degree: Secondary | ICD-10-CM

## 2022-04-10 NOTE — Telephone Encounter (Signed)
Transitions of Care Pharmacy   Call attempted for a pharmacy transitions of care follow-up. HIPAA appropriate voicemail was left with call back information provided.   Call attempt #1. Will follow-up in 2-3 days.   Darcus Austin, [place] 04/10/2022 11:33 AM

## 2022-04-10 NOTE — Telephone Encounter (Signed)
Pharmacy Transitions of Care Follow-up Telephone Call  Date of discharge: 04/02/22  Discharge Diagnosis: DVT  How have you been since you were released from the hospital? Patient has been well since leaving the hospital, no side effects on Xarelto unlike Eliquis. Patient has a high knowledge base about her medications and conditions and will follow up with her cardiologist for refills.    Medication changes made at discharge: START taking these medications  START taking these medications  Xarelto Starter Pack Generic drug: Rivaroxaban Stater Pack (15 mg and 20 mg) Follow package directions: Take one '15mg'$  tablet by mouth twice a day. On day 22, switch to one '20mg'$  tablet once a day. Take with food.   CHANGE how you take these medications  CHANGE how you take these medications  valACYclovir 1000 MG tablet Commonly known as: VALTREX Take 1 tablet (1,000 mg total) by mouth daily as needed. What changed: reasons to take this   CONTINUE taking these medications  CONTINUE taking these medications  acetaminophen 325 MG tablet Commonly known as: TYLENOL  AIRBORNE GUMMIES PO  CALCIUM PO  diclofenac sodium 1 % Gel Commonly known as: VOLTAREN  famotidine 40 MG tablet Commonly known as: PEPCID  Fish Oil Ultra 1400 MG Caps  fluticasone 50 MCG/ACT nasal spray Commonly known as: FLONASE  melatonin 3 MG Tabs tablet  multivitamin with minerals Tabs tablet  oxyCODONE 5 MG immediate release tablet Commonly known as: Roxicodone Take 1 tablet (5 mg total) by mouth every 6 (six) hours as needed for up to 20 doses for breakthrough pain.  PROBIOTIC FORMULA PO  rosuvastatin 5 MG tablet Commonly known as: Crestor Take 1 tablet (5 mg total) by mouth daily.  Vitamin B-12 5000 MCG Subl  Vitamin D3 125 MCG (5000 UT) Caps   STOP taking these medications  STOP taking these medications  phenazopyridine 100 MG tablet Commonly known as: Pyridium    Medication changes verified by the patient? yes     Medication Accessibility:  Home Pharmacy: [facility]   Was the patient provided with refills on discharged medications? no   Have all prescriptions been transferred from [facility] to home pharmacy? N/a   Is the patient interested in using a [facility]? N/a  Is the patient able to afford medications? yes  Referred patient to patient care advocate for medication assistance? no  Medication Review:  RIVAROXABAN (XARELTO)  Rivaroxaban 15 mg BID initiated on 04/02/22 for DVT. Will switch to 20 mg daily after 21 days (DATE 04/23/22).   - Discussed importance of taking medication around the same time every day.  - Advised patient of medications to avoid (NSAIDs, ASA maintenance doses>100 mg daily)  - Educated that Tylenol (acetaminophen) will be the preferred analgesic to prevent risk of bleeding. - Emphasized importance of monitoring for signs and symptoms of bleeding (abnormal bruising, prolonged bleeding, nose bleeds, bleeding from gums, discolored urine, black tarry stools)  - Advised patient to alert all providers of anticoagulation therapy prior to starting a new medication or having a procedure.  Follow-up Appointments: Date Visit Type Length Department    04/17/2022 11:00 AM LAB 15 min [place] at [facility] [[id number]]    If their condition worsens, is the pt aware to call PCP or go to the Emergency Dept.? yes  Final Patient Assessment: Patient has been well since leaving the hospital, no side effects on Xarelto unlike Eliquis. Patient has a high knowledge base about her medications and conditions and will  follow up with her cardiologist for refills.    Darcus Austin, PharmD Clinical Pharmacist [facility] 04/10/2022 12:05 PM

## 2022-04-10 NOTE — Telephone Encounter (Signed)
Patient states she needs to speak with someone about her FMLA paperwork. She says it looks like it was signed by an Raymond Gurney, but it is difficult to read. She wants to know if the office has received another request and if she needs to come in and pay for anymore paperwork, because she does not want any more delay.

## 2022-04-11 ENCOUNTER — Ambulatory Visit (HOSPITAL_BASED_OUTPATIENT_CLINIC_OR_DEPARTMENT_OTHER): Payer: 59 | Admitting: Orthopaedic Surgery

## 2022-04-14 NOTE — Telephone Encounter (Signed)
[place], [place]" - 04/10/2022  4:33 PM Damian Leavell, RN  Sent: Mon April 14, 2022 10:19 AM  To: [facility] [place] Triage          Message  It's located in media under 03/25/2022.  We also mailed her a copy.  It was faxed.    Called the pt back and endorsed to her that per [name] RN, last FMLA forms completed, which was faxed to the requesting party and hard copy mailed to the pt, was on 03/25/22 (as indicated above).   Pt states she is aware of that completion, but there is another set of forms coming from [employer], indicating an extension on her leave to the date of 9/14.  Pt aware that I do not see any new forms in her chart since 8/1 form completion.  I did not see any new forms in Dr. Tanna Furry mailbox either. Pt is aware that I will route this message to both Myrtie Hawk RN and Merrilee Seashore, to further investigate on additional forms sent, when [name] returns to the office.  Pt aware that [name] will not be available to further advise and follow-up with the pt on this matter, until around 8/22. Pt verbalized understanding and agrees with this plan.  She will await a callback tomorrow about this matter.

## 2022-04-14 NOTE — Telephone Encounter (Signed)
Called patient regarding messages received per Triage, and Magdalen Spatz RN last week.   Pt stated she wanted CBC with Diff drawn to recheck her HGB.  Dr. Beckie Salts gave order to redraw this lab.  Pt is having labs drawn with Dr. Riki Sheer, PCP on 04/17/22.  This lab order was added through Epic.    Pt also requesting a return to work note from Dr. 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  Will discuss with Dr. Lovena Le, and MyChart the patients note to her.  Dr. Lovena Le ok'd return to work 05/12/22, but will consult / check if 9/11 is ok.    F/u required.  Will message Dr. Nani Ravens regarding CBC on 04/17/22 at his office.

## 2022-04-15 NOTE — Telephone Encounter (Signed)
Return to work letter created and emailed to [employer].  Letter also sent to Pt.  Await further needs.

## 2022-04-17 ENCOUNTER — Other Ambulatory Visit (INDEPENDENT_AMBULATORY_CARE_PROVIDER_SITE_OTHER): Payer: 59

## 2022-04-17 ENCOUNTER — Telehealth: Payer: Self-pay | Admitting: *Deleted

## 2022-04-17 DIAGNOSIS — I441 Atrioventricular block, second degree: Secondary | ICD-10-CM | POA: Diagnosis not present

## 2022-04-17 DIAGNOSIS — E785 Hyperlipidemia, unspecified: Secondary | ICD-10-CM

## 2022-04-17 LAB — CBC WITH DIFFERENTIAL/PLATELET
Basophils Absolute: 0 10*3/uL (ref 0.0–0.1)
Basophils Relative: 1 % (ref 0.0–3.0)
Eosinophils Absolute: 0.1 10*3/uL (ref 0.0–0.7)
Eosinophils Relative: 1.7 % (ref 0.0–5.0)
HCT: 32.4 % — ABNORMAL LOW (ref 36.0–46.0)
Hemoglobin: 10.5 g/dL — ABNORMAL LOW (ref 12.0–15.0)
Lymphocytes Relative: 32.6 % (ref 12.0–46.0)
Lymphs Abs: 1.3 10*3/uL (ref 0.7–4.0)
MCHC: 32.2 g/dL (ref 30.0–36.0)
MCV: 95.3 fl (ref 78.0–100.0)
Monocytes Absolute: 0.3 10*3/uL (ref 0.1–1.0)
Monocytes Relative: 8.6 % (ref 3.0–12.0)
Neutro Abs: 2.3 10*3/uL (ref 1.4–7.7)
Neutrophils Relative %: 56.1 % (ref 43.0–77.0)
Platelets: 282 10*3/uL (ref 150.0–400.0)
RBC: 3.4 Mil/uL — ABNORMAL LOW (ref 3.87–5.11)
RDW: 14.5 % (ref 11.5–15.5)
WBC: 4 10*3/uL (ref 4.0–10.5)

## 2022-04-17 LAB — LIPID PANEL
Cholesterol: 125 mg/dL (ref 0–200)
HDL: 46 mg/dL (ref >39.00)
LDL Cholesterol: 49 mg/dL (ref 0–99)
NonHDL: 79.44
Total CHOL/HDL Ratio: 3
Triglycerides: 152 mg/dL — ABNORMAL HIGH (ref 0.0–149.0)
VLDL: 30.4 mg/dL (ref 0.0–40.0)

## 2022-04-17 NOTE — Telephone Encounter (Signed)
I placed order per PCP authorization.  Do you want me to contact pt to schedule a hospital follow up with you?

## 2022-04-17 NOTE — Telephone Encounter (Signed)
Pt came in for lipid panel and said she was also needing a CBC w/diff done by Dr Lovena Le (cardiology). Said they were supposed to have contacted PCP about this.  Pt states they are following up on her hgb after blood loss from recent surgery.  I drew lavender tube in case PCP places order. Advised pt we would contact her if we are unable to order CBC.  Our office is currently not able do any lab orders for Providers outside of [facility] due to billing conflicts.  Please place future order or advise?

## 2022-04-18 ENCOUNTER — Other Ambulatory Visit (HOSPITAL_BASED_OUTPATIENT_CLINIC_OR_DEPARTMENT_OTHER): Payer: Self-pay

## 2022-04-21 ENCOUNTER — Other Ambulatory Visit (HOSPITAL_BASED_OUTPATIENT_CLINIC_OR_DEPARTMENT_OTHER): Payer: Self-pay

## 2022-04-21 ENCOUNTER — Encounter: Payer: Self-pay | Admitting: Internal Medicine

## 2022-04-21 ENCOUNTER — Other Ambulatory Visit (HOSPITAL_COMMUNITY): Payer: Self-pay

## 2022-04-24 ENCOUNTER — Other Ambulatory Visit (HOSPITAL_COMMUNITY): Payer: Self-pay

## 2022-04-24 MED ORDER — RIVAROXABAN 20 MG PO TABS: 20.0000 mg | ORAL_TABLET | Freq: Every day | ORAL | 6 refills | Status: DC

## 2022-04-24 MED ORDER — RIVAROXABAN 20 MG PO TABS
20.0000 mg | ORAL_TABLET | Freq: Every day | ORAL | 6 refills | Status: DC
  Filled 2022-04-24: qty 30, 30d supply, fill #0
  Filled 2022-05-23: qty 30, 30d supply, fill #1
  Filled 2022-06-30: qty 30, 30d supply, fill #2
  Filled 2022-07-29: qty 30, 30d supply, fill #3

## 2022-05-10 ENCOUNTER — Ambulatory Visit
Admission: EM | Admit: 2022-05-10 | Discharge: 2022-05-10 | Disposition: A | Discharge status: Home / Self Care | Payer: 59 | Attending: Urgent Care | Admitting: Urgent Care

## 2022-05-10 ENCOUNTER — Encounter: Payer: Self-pay | Admitting: Emergency Medicine

## 2022-05-10 DIAGNOSIS — R5381 Other malaise: Secondary | ICD-10-CM

## 2022-05-10 DIAGNOSIS — J309 Allergic rhinitis, unspecified: Secondary | ICD-10-CM | POA: Diagnosis not present

## 2022-05-10 DIAGNOSIS — R5383 Other fatigue: Secondary | ICD-10-CM

## 2022-05-10 DIAGNOSIS — R052 Subacute cough: Secondary | ICD-10-CM | POA: Diagnosis not present

## 2022-05-10 DIAGNOSIS — R0989 Other specified symptoms and signs involving the circulatory and respiratory systems: Secondary | ICD-10-CM

## 2022-05-10 DIAGNOSIS — R0981 Nasal congestion: Secondary | ICD-10-CM

## 2022-05-10 DIAGNOSIS — J019 Acute sinusitis, unspecified: Secondary | ICD-10-CM

## 2022-05-10 MED ORDER — AMOXICILLIN-POT CLAVULANATE 875-125 MG PO TABS: 1.0000 | ORAL_TABLET | Freq: Two times a day (BID) | ORAL | 0 refills | Status: DC

## 2022-05-10 NOTE — ED Provider Notes (Signed)
[facility]  Note:  This document was prepared using Systems analyst and may include unintentional dictation errors.  MRN: [id number] DOB: [birth year]  Subjective:   Kari [name] is a 71 y.o. female presenting for 1 week history of persistent malaise, fatigue, sinus congestion, coughing, chest congestion.  Patient works as a Statistician.  She has some exposure but uses PPE.  No history of respiratory disorders.  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  Patient is not a smoker.  She was recently hospitalized in August 2023, referred to the chart for review.   No current facility-administered medications for this encounter.  Current Outpatient Medications:    acetaminophen (TYLENOL) 325 MG tablet, Take 650 mg by mouth every 6 (six) hours as needed for moderate pain or fever., Disp: , Rfl:    CALCIUM PO, Take 1 tablet by mouth daily., Disp: , Rfl:    Cholecalciferol (VITAMIN D3) 125 MCG (5000 UT) CAPS, Take 5,000 Units by mouth daily., Disp: , Rfl:    Cyanocobalamin (VITAMIN B-12) 5000 MCG SUBL, Place 5,000 mcg under the tongue daily., Disp: , Rfl:    diclofenac sodium (VOLTAREN) 1 % GEL, Apply 1-2 g topically 4 (four) times daily as needed (knee pain.)., Disp: , Rfl:    famotidine (PEPCID) 40 MG tablet, Take 40 mg by mouth every morning., Disp: , Rfl:    fluticasone (FLONASE) 50 MCG/ACT nasal spray, Place 1 spray into both nostrils daily as needed for allergies or rhinitis., Disp: , Rfl:    Melatonin 3 MG TABS, Take 6 mg by mouth at bedtime as needed (sleep.)., Disp: , Rfl:    Multiple Vitamin (MULTIVITAMIN WITH MINERALS) TABS, Take 1 tablet by mouth daily., Disp: , Rfl:    Multiple Vitamins-Minerals (AIRBORNE GUMMIES PO), Take 2 each by mouth daily., Disp: , Rfl:    Omega-3 Fatty Acids (FISH OIL ULTRA) 1400 MG CAPS, Take 1,400 mg by mouth  daily., Disp: , Rfl:    oxyCODONE (ROXICODONE) 5 MG immediate release tablet, Take 1 tablet (5 mg total) by mouth every 6 (six) hours as needed for up to 20 doses for breakthrough pain., Disp: 20 tablet, Rfl: 0   Probiotic Product (PROBIOTIC FORMULA PO), Take 1 capsule by mouth daily., Disp: , Rfl:    rivaroxaban (XARELTO) 20 MG TABS tablet, Take 1 tablet (20 mg total) by mouth daily with supper., Disp: 30 tablet, Rfl: 6   rosuvastatin (CRESTOR) 5 MG tablet, Take 1 tablet (5 mg total) by mouth daily., Disp: 30 tablet, Rfl: 3   valACYclovir (VALTREX) 1000 MG tablet, Take 1 tablet (1,000 mg total) by mouth daily as needed. (Patient taking differently: Take 1 g by mouth daily as needed (cold sores outbreak).), Disp: 90 tablet, Rfl: 2   Allergies  Allergen Reactions   Eliquis [Apixaban] Palpitations    Diaphoresis, tachycardia   Bactrim [Sulfamethoxazole-Trimethoprim] Hives   Ceftin Itching    Tolerates amoxicillin   Vancomycin Hives   Levofloxacin Other (See Comments)    "spacey," tolerates Cipro    Past Medical History:  Diagnosis Date   Arthritis    Chronic back pain    Diverticulitis    DVT (deep venous thrombosis) [facility]) age 17   s/p  knee arthroscopy    Herpes zoster conjunctivitis    Menopause    Mobitz type 2 second degree AV block    2:1/notes 02/16/2013  PONV (postoperative nausea and vomiting)    Presence of permanent cardiac pacemaker    Seasonal allergies    "spring & fall; not q year" (02/17/2013)   Shoulder impingement    and left rotator cuff tear     Past Surgical History:  Procedure Laterality Date   ACHILLES TENDON SURGERY     ARTHROGRAM KNEE Right 1975   CHOLECYSTECTOMY  1990's   colonscopy     ESOPHAGOGASTRODUODENOSCOPY     EYE SURGERY     muscle release right eye   INSERT / REPLACE / REMOVE PACEMAKER  02/17/2013   [employer] Advantio dual-chamber pacemaker, model [id number],   KNEE ARTHROSCOPY Right 1982, 1983, 1995, 1996   KNEE ARTHROSCOPY W/ ACL  RECONSTRUCTION Left 1984   LAPAROSCOPIC SIGMOID COLECTOMY N/A 01/01/2015   Procedure: LAPAROSCOPIC SIGMOID COLECTOMY;  Surgeon: Stark Klein, MD;  Location: [place];  Service: General;  Laterality: N/A;   LEAD EXTRACTION N/A 03/24/2022   Procedure: LEAD EXTRACTION;  Surgeon: Evans Lance, MD;  Location: [place] CV LAB;  Service: Cardiovascular;  Laterality: N/A;   PACEMAKER IMPLANT N/A 03/24/2022   Procedure: PACEMAKER IMPLANT;  Surgeon: Evans Lance, MD;  Location: [place] CV [facility];  Service: Cardiovascular;  Laterality: N/A;   PERMANENT PACEMAKER INSERTION N/A 02/17/2013   Procedure: PERMANENT PACEMAKER INSERTION;  Surgeon: Evans Lance, MD;  Location: [facility] CATH LAB;  Service: Cardiovascular;  Laterality: N/A;   SHOULDER ARTHROSCOPY Left 09/05/2016   Procedure: LEFT SHOULDER ARTHROSCOPY , ACROMIOPLASTY AND ROTATOR CUFF REPAIR;  Surgeon: Melrose Nakayama, MD;  Location: [place];  Service: Orthopedics;  Laterality: Left;   TONSILLECTOMY  1961   TOTAL KNEE ARTHROPLASTY Right 2000; 06/2000   "replaced w/appropriate hardware" (02/17/2013)   TOTAL KNEE REVISION Right 04/13/2019   Procedure: Revision Right Knee Arthroplasty;  Surgeon: Frederik Pear, MD;  Location: [facility];  Service: Orthopedics;  Laterality: Right;   TRIGGER FINGER RELEASE Right ~ 2012   "thumb" (02/17/2013)   VAGINAL HYSTERECTOMY  ~ 1994   partial     Family History  Problem Relation Age of Onset   Hypertension Mother    Hyperlipidemia Mother    Macular degeneration Mother    COPD Mother    Lung cancer Father    Cancer Father        Lung   COPD Father    Diabetes Brother    Breast cancer Paternal Aunt     Social History   Tobacco Use   Smoking status: Never   Smokeless tobacco: Never  Vaping Use   Vaping Use: Never used  Substance Use Topics   Alcohol use: Yes    Comment: occasional wine    Drug use: No    ROS   Objective:   Vitals: BP (!) 153/76   Pulse 74   Temp 97.9 F (36.6 C)   Resp 20   SpO2  98%   Physical Exam Constitutional:      General: She is not in acute distress.    Appearance: Normal appearance. She is well-developed. She is not ill-appearing, toxic-appearing or diaphoretic.  HENT:     Head: Normocephalic and atraumatic.     Nose: Nose normal.     Mouth/Throat:     Mouth: Mucous membranes are moist.  Eyes:     General: No scleral icterus.       Right eye: No discharge.        Left eye: No discharge.     Extraocular Movements: Extraocular  movements intact.  Cardiovascular:     Rate and Rhythm: Normal rate and regular rhythm.     Heart sounds: Normal heart sounds. No murmur heard.    No friction rub. No gallop.  Pulmonary:     Effort: Pulmonary effort is normal. No respiratory distress.     Breath sounds: No stridor. No wheezing, rhonchi or rales.  Chest:     Chest wall: No tenderness.  Skin:    General: Skin is warm and dry.  Neurological:     General: No focal deficit present.     Mental Status: She is alert and oriented to person, place, and time.  Psychiatric:        Mood and Affect: Mood normal.        Behavior: Behavior normal.     Assessment and Plan :   PDMP not reviewed this encounter.  1. Acute non-recurrent sinusitis, unspecified location   2. Malaise and fatigue   3. Decreased energy   4. Subacute cough   5. Chest congestion   6. Sinus congestion   7. Allergic rhinitis, unspecified seasonality, unspecified trigger    Offered chest x-ray but patient and I agreed to defer for now given hemodynamically stable vital signs, clear cardiopulmonary exam.  Will start empiric treatment for sinusitis with Augmentin; this is appropriate given her sinus symptoms.  Recommended supportive care otherwise including the use of oral antihistamine. Counseled patient on potential for adverse effects with medications prescribed/recommended today, ER and return-to-clinic precautions discussed, patient verbalized understanding.    Jaynee Eagles, [place] 05/11/22  [phone]

## 2022-05-10 NOTE — ED Triage Notes (Signed)
Pt here with cough, chest congestion, nasal congestion, and extreme malaise x 1 week.

## 2022-05-20 ENCOUNTER — Other Ambulatory Visit (HOSPITAL_BASED_OUTPATIENT_CLINIC_OR_DEPARTMENT_OTHER): Payer: Self-pay | Admitting: Family Medicine

## 2022-05-20 DIAGNOSIS — Z1231 Encounter for screening mammogram for malignant neoplasm of breast: Secondary | ICD-10-CM

## 2022-05-21 ENCOUNTER — Inpatient Hospital Stay (HOSPITAL_BASED_OUTPATIENT_CLINIC_OR_DEPARTMENT_OTHER): Admission: RE | Admit: 2022-05-21 | Payer: 59 | Source: Ambulatory Visit

## 2022-05-22 ENCOUNTER — Telehealth: Payer: 59 | Admitting: Family Medicine

## 2022-05-22 ENCOUNTER — Encounter (INDEPENDENT_AMBULATORY_CARE_PROVIDER_SITE_OTHER): Payer: 59 | Admitting: Family Medicine

## 2022-05-22 DIAGNOSIS — R300 Dysuria: Secondary | ICD-10-CM

## 2022-05-22 DIAGNOSIS — R3989 Other symptoms and signs involving the genitourinary system: Secondary | ICD-10-CM

## 2022-05-22 MED ORDER — NITROFURANTOIN MONOHYD MACRO 100 MG PO CAPS: 100.0000 mg | ORAL_CAPSULE | Freq: Two times a day (BID) | ORAL | 0 refills | Status: AC

## 2022-05-22 MED ORDER — FLUCONAZOLE 150 MG PO TABS: ORAL_TABLET | ORAL | 0 refills | Status: DC

## 2022-05-22 NOTE — Progress Notes (Signed)
[place]   Needs in person for urine sample, recent sinus infection tx with augmentin just completed.  Patient acknowledged agreement and understanding of the plan.

## 2022-05-22 NOTE — Telephone Encounter (Signed)

## 2022-05-23 ENCOUNTER — Other Ambulatory Visit (HOSPITAL_COMMUNITY): Payer: Self-pay

## 2022-05-24 ENCOUNTER — Other Ambulatory Visit: Payer: Self-pay | Admitting: Physician Assistant

## 2022-05-24 DIAGNOSIS — M25511 Pain in right shoulder: Secondary | ICD-10-CM | POA: Diagnosis not present

## 2022-05-24 DIAGNOSIS — S1980XA Other specified injuries of unspecified part of neck, initial encounter: Secondary | ICD-10-CM

## 2022-05-24 DIAGNOSIS — M542 Cervicalgia: Secondary | ICD-10-CM | POA: Diagnosis not present

## 2022-05-27 ENCOUNTER — Telehealth (HOSPITAL_BASED_OUTPATIENT_CLINIC_OR_DEPARTMENT_OTHER): Payer: Self-pay

## 2022-05-28 ENCOUNTER — Ambulatory Visit (HOSPITAL_BASED_OUTPATIENT_CLINIC_OR_DEPARTMENT_OTHER): Payer: 59

## 2022-05-29 ENCOUNTER — Telehealth: Payer: Self-pay

## 2022-05-29 NOTE — Telephone Encounter (Signed)
The patient states she had some kind of strange "electrical flutter". Its not like a heart flutter but feel different. I had her send a transmission for the nurse to review. She thinks it could be something with the leads. I told her to wait for 10 minutes then try again. If it do not work we will call tech support to get additional help.

## 2022-05-30 NOTE — Telephone Encounter (Signed)
I called [employer] with the patient on the phone. Transmission sent. [name], rn looked at the transmission and everything was normal.

## 2022-05-30 NOTE — Telephone Encounter (Signed)
Spoke with Pt.  Reassured her that discomfort in her right breast did not come from her pacemaker.    Advised pacemaker is working normally.  All questions answered.

## 2022-06-02 ENCOUNTER — Ambulatory Visit (HOSPITAL_COMMUNITY)
Admission: RE | Admit: 2022-06-02 | Discharge: 2022-06-02 | Disposition: A | Discharge status: Home / Self Care | Payer: PRIVATE HEALTH INSURANCE | Source: Ambulatory Visit | Attending: Physician Assistant | Admitting: Physician Assistant

## 2022-06-02 DIAGNOSIS — S1980XA Other specified injuries of unspecified part of neck, initial encounter: Secondary | ICD-10-CM | POA: Insufficient documentation

## 2022-06-02 DIAGNOSIS — M4319 Spondylolisthesis, multiple sites in spine: Secondary | ICD-10-CM | POA: Diagnosis not present

## 2022-06-02 DIAGNOSIS — S199XXA Unspecified injury of neck, initial encounter: Secondary | ICD-10-CM | POA: Diagnosis not present

## 2022-06-02 DIAGNOSIS — M25512 Pain in left shoulder: Secondary | ICD-10-CM | POA: Diagnosis not present

## 2022-06-02 DIAGNOSIS — M25511 Pain in right shoulder: Secondary | ICD-10-CM | POA: Diagnosis not present

## 2022-06-04 ENCOUNTER — Ambulatory Visit (HOSPITAL_BASED_OUTPATIENT_CLINIC_OR_DEPARTMENT_OTHER): Payer: 59

## 2022-06-10 ENCOUNTER — Encounter (HOSPITAL_BASED_OUTPATIENT_CLINIC_OR_DEPARTMENT_OTHER): Payer: Self-pay

## 2022-06-10 ENCOUNTER — Ambulatory Visit (HOSPITAL_BASED_OUTPATIENT_CLINIC_OR_DEPARTMENT_OTHER)
Admission: RE | Admit: 2022-06-10 | Discharge: 2022-06-10 | Disposition: A | Discharge status: Home / Self Care | Payer: 59 | Source: Ambulatory Visit | Attending: Family Medicine | Admitting: Family Medicine

## 2022-06-10 DIAGNOSIS — Z1231 Encounter for screening mammogram for malignant neoplasm of breast: Secondary | ICD-10-CM | POA: Insufficient documentation

## 2022-06-24 ENCOUNTER — Ambulatory Visit (INDEPENDENT_AMBULATORY_CARE_PROVIDER_SITE_OTHER): Payer: 59

## 2022-06-24 DIAGNOSIS — Z950 Presence of cardiac pacemaker: Secondary | ICD-10-CM

## 2022-06-24 DIAGNOSIS — I441 Atrioventricular block, second degree: Secondary | ICD-10-CM | POA: Diagnosis not present

## 2022-06-24 LAB — CUP PACEART REMOTE DEVICE CHECK
Battery Remaining Longevity: 126 mo
Battery Remaining Percentage: 100 %
Brady Statistic RA Percent Paced: 37 %
Brady Statistic RV Percent Paced: 100 %
Date Time Interrogation Session: 20231031082400
Implantable Lead Connection Status: 753985
Implantable Lead Connection Status: 753985
Implantable Lead Implant Date: 20230731
Implantable Lead Implant Date: 20230731
Implantable Lead Location: 753859
Implantable Lead Location: 753860
Implantable Lead Model: 7841
Implantable Lead Model: 7842
Implantable Lead Serial Number: 1215821
Implantable Lead Serial Number: 1304218
Implantable Pulse Generator Implant Date: 20230731
Lead Channel Impedance Value: 670 Ohm
Lead Channel Impedance Value: 677 Ohm
Lead Channel Setting Pacing Amplitude: 3.5 V
Lead Channel Setting Pacing Amplitude: 3.5 V
Lead Channel Setting Pacing Pulse Width: 0.4 ms
Lead Channel Setting Sensing Sensitivity: 4 mV
Pulse Gen Serial Number: 600527
Zone Setting Status: 755011

## 2022-06-30 ENCOUNTER — Other Ambulatory Visit: Payer: Self-pay | Admitting: Family Medicine

## 2022-07-01 ENCOUNTER — Other Ambulatory Visit (HOSPITAL_COMMUNITY): Payer: Self-pay

## 2022-07-01 MED ORDER — ROSUVASTATIN CALCIUM 5 MG PO TABS
5.0000 mg | ORAL_TABLET | Freq: Every day | ORAL | 3 refills | Status: DC
  Filled 2022-07-01 – 2022-07-29 (×2): qty 30, 30d supply, fill #0
  Filled 2022-09-04: qty 30, 30d supply, fill #1
  Filled 2022-10-14: qty 30, 30d supply, fill #2
  Filled 2022-11-11: qty 30, 30d supply, fill #3

## 2022-07-04 ENCOUNTER — Other Ambulatory Visit (HOSPITAL_COMMUNITY): Payer: Self-pay

## 2022-07-09 NOTE — Progress Notes (Signed)
Remote pacemaker transmission.   

## 2022-07-10 ENCOUNTER — Ambulatory Visit: Payer: 59 | Attending: Internal Medicine | Admitting: Internal Medicine

## 2022-07-10 ENCOUNTER — Encounter: Payer: Self-pay | Admitting: Internal Medicine

## 2022-07-10 VITALS — BP 140/82 | HR 85 | Ht 68.0 in | Wt 143.0 lb

## 2022-07-10 DIAGNOSIS — I10 Essential (primary) hypertension: Secondary | ICD-10-CM

## 2022-07-10 DIAGNOSIS — I442 Atrioventricular block, complete: Secondary | ICD-10-CM | POA: Diagnosis not present

## 2022-07-10 DIAGNOSIS — Z950 Presence of cardiac pacemaker: Secondary | ICD-10-CM

## 2022-07-10 NOTE — Patient Instructions (Signed)
Medication Instructions:  Your physician recommends that you continue on your current medications as directed. Please refer to the Current Medication list given to you today.  *If you need a refill on your cardiac medications before your next appointment, please call your pharmacy*  Lab Work: None ordered.  If you have labs (blood work) drawn today and your tests are completely normal, you will receive your results only by: [name] (if you have MyChart) OR A paper copy in the mail If you have any lab test that is abnormal or we need to change your treatment, we will call you to review the results.  Testing/Procedures: None ordered.  Follow-Up: At [facility], you and your health needs are our priority.  As part of our continuing mission to provide you with exceptional heart care, we have created designated Provider Care Teams.  These Care Teams include your primary Cardiologist (physician) and Advanced Practice Providers (APPs -  Physician Assistants and Nurse Practitioners) who all work together to provide you with the care you need, when you need it.  We recommend signing up for the patient portal called "MyChart".  Sign up information is provided on this After Visit Summary.  MyChart is used to connect with patients for Virtual Visits (Telemedicine).  Patients are able to view lab/test results, encounter notes, upcoming appointments, etc.  Non-urgent messages can be sent to your provider as well.   To learn more about what you can do with MyChart, go to [email or web address].    Your next appointment:   1 year(s)  The format for your next appointment:   In Person  Provider:   Cristopher Peru, MD{or one of the following Advanced Practice Providers on your designated Care Team:   [name], [place] Legrand Como "Jonni Sanger" Chalmers Cater, [place]  Remote monitoring is used to monitor your Pacemaker from home. This monitoring reduces the number of office visits required to check your device to  one time per year. It allows [place] to keep an eye on the functioning of your device to ensure it is working properly. You are scheduled for a device check from home on 09/23/22. You may send your transmission at any time that day. If you have a wireless device, the transmission will be sent automatically. After your physician reviews your transmission, you will receive a postcard with your next transmission date.  Important Information About [place]

## 2022-07-10 NOTE — Progress Notes (Signed)
HPI Kari [name] returns today for followup. She is a pleasant 71 yo woman with CHB, who developed noise on her ventricular lead resulting in failure to pace and recurrent syncope and near syncope. She underwent extraction of her ventricular lead as well as her atrial lead. She has done well since her new device was placed. She denies chest pain or sob. No syncope.  Allergies  Allergen Reactions   Eliquis [Apixaban] Palpitations    Diaphoresis, tachycardia   Bactrim [Sulfamethoxazole-Trimethoprim] Hives   Ceftin Itching    Tolerates amoxicillin   Vancomycin Hives   Levofloxacin Other (See Comments)    "spacey," tolerates Cipro     Current Outpatient Medications  Medication Sig Dispense Refill   acetaminophen (TYLENOL) 325 MG tablet Take 650 mg by mouth every 6 (six) hours as needed for moderate pain or fever.     CALCIUM PO Take 1 tablet by mouth daily.     Cholecalciferol (VITAMIN D3) 125 MCG (5000 UT) CAPS Take 5,000 Units by mouth daily.     Cyanocobalamin (VITAMIN B-12) 5000 MCG SUBL Place 5,000 mcg under the tongue daily.     diclofenac sodium (VOLTAREN) 1 % GEL Apply 1-2 g topically 4 (four) times daily as needed (knee pain.).     famotidine (PEPCID) 40 MG tablet Take 40 mg by mouth every morning.     fluticasone (FLONASE) 50 MCG/ACT nasal spray Place 1 spray into both nostrils daily as needed for allergies or rhinitis.     Multiple Vitamin (MULTIVITAMIN WITH MINERALS) TABS Take 1 tablet by mouth daily.     Multiple Vitamins-Minerals (AIRBORNE GUMMIES PO) Take 2 each by mouth daily.     Omega-3 Fatty Acids (FISH OIL ULTRA) 1400 MG CAPS Take 1,400 mg by mouth daily.     Probiotic Product (PROBIOTIC FORMULA PO) Take 1 capsule by mouth daily.     rivaroxaban (XARELTO) 20 MG TABS tablet Take 1 tablet (20 mg total) by mouth daily with supper. 30 tablet 6   rosuvastatin (CRESTOR) 5 MG tablet Take 1 tablet (5 mg total) by mouth daily. 30 tablet 3   valACYclovir (VALTREX) 1000  MG tablet Take 1 tablet (1,000 mg total) by mouth daily as needed. (Patient taking differently: Take 1 g by mouth daily as needed (cold sores outbreak).) 90 tablet 2   Melatonin 3 MG TABS Take 6 mg by mouth at bedtime as needed (sleep.).     No current facility-administered medications for this visit.     Past Medical History:  Diagnosis Date   Arthritis    Chronic back pain    Diverticulitis    DVT (deep venous thrombosis) [facility]) age 13   s/p  knee arthroscopy    Herpes zoster conjunctivitis    Menopause    Mobitz type 2 second degree AV block    2:1/notes 02/16/2013   PONV (postoperative nausea and vomiting)    Presence of permanent cardiac pacemaker    Seasonal allergies    "spring & fall; not q year" (02/17/2013)   Shoulder impingement    and left rotator cuff tear    ROS:   All systems reviewed and negative except as noted in the HPI.   Past Surgical History:  Procedure Laterality Date   ACHILLES TENDON SURGERY     ARTHROGRAM KNEE Right 1975   CHOLECYSTECTOMY  1990's   colonscopy     ESOPHAGOGASTRODUODENOSCOPY     EYE SURGERY     muscle release right eye  INSERT / REPLACE / REMOVE PACEMAKER  02/17/2013   [employer] Advantio dual-chamber pacemaker, model [id number],   KNEE ARTHROSCOPY Right 1982, 1983, 1995, 1996   KNEE ARTHROSCOPY W/ ACL RECONSTRUCTION Left 1984   LAPAROSCOPIC SIGMOID COLECTOMY N/A 01/01/2015   Procedure: LAPAROSCOPIC SIGMOID COLECTOMY;  Surgeon: Stark Klein, MD;  Location: [place];  Service: General;  Laterality: N/A;   LEAD EXTRACTION N/A 03/24/2022   Procedure: LEAD EXTRACTION;  Surgeon: Evans Lance, MD;  Location: [place] [facility];  Service: Cardiovascular;  Laterality: N/A;   PACEMAKER IMPLANT N/A 03/24/2022   Procedure: PACEMAKER IMPLANT;  Surgeon: Evans Lance, MD;  Location: [place] [facility];  Service: Cardiovascular;  Laterality: N/A;   PERMANENT PACEMAKER INSERTION N/A 02/17/2013   Procedure: PERMANENT PACEMAKER INSERTION;   Surgeon: Evans Lance, MD;  Location: [facility];  Service: Cardiovascular;  Laterality: N/A;   SHOULDER ARTHROSCOPY Left 09/05/2016   Procedure: LEFT SHOULDER ARTHROSCOPY , ACROMIOPLASTY AND ROTATOR CUFF REPAIR;  Surgeon: Melrose Nakayama, MD;  Location: [place];  Service: Orthopedics;  Laterality: Left;   TONSILLECTOMY  1961   TOTAL KNEE ARTHROPLASTY Right 2000; 06/2000   "replaced w/appropriate hardware" (02/17/2013)   TOTAL KNEE REVISION Right 04/13/2019   Procedure: Revision Right Knee Arthroplasty;  Surgeon: Frederik Pear, MD;  Location: [facility];  Service: Orthopedics;  Laterality: Right;   TRIGGER FINGER RELEASE Right ~ 2012   "thumb" (02/17/2013)   VAGINAL HYSTERECTOMY  ~ 1994   partial      Family History  Problem Relation Age of Onset   Hypertension Mother    Hyperlipidemia Mother    Macular degeneration Mother    COPD Mother    Lung cancer Father    Cancer Father        Lung   COPD Father    Diabetes Brother    Breast cancer Paternal Aunt      Social History   Socioeconomic History   Marital status: Married    Spouse name: Not on file   Number of children: Not on file   Years of education: Not on file   Highest education level: Not on file  Occupational History   Occupation: Programmer, multimedia: [employer]    Comment: [employer]  Tobacco Use   Smoking status: Never   Smokeless tobacco: Never  Vaping Use   Vaping Use: Never used  Substance and Sexual Activity   Alcohol use: Yes    Comment: occasional wine    Drug use: No   Sexual activity: Yes    Birth control/protection: Surgical    Comment: 02/17/2013 "I have a same sex partner"  Other Topics Concern   Not on file  Social History Narrative   Not on file   Social Determinants of Health   Financial Resource Strain: Not on file  Food Insecurity: Not on file  Transportation Needs: Not on file  Physical Activity: Not on file  Stress: Not on file  Social Connections: Not on file   Intimate Partner Violence: Not on file     BP (!) 140/82   Pulse 85   Ht '5\' 8"'$  (1.727 m)   Wt 143 lb (64.9 kg)   SpO2 97%   BMI 21.74 kg/m   Physical Exam:  Well appearing NAD HEENT: Unremarkable Neck:  No JVD, no thyromegally Lymphatics:  No adenopathy Back:  No CVA tenderness Lungs:  Clear with no wheezes HEART:  Regular rate rhythm, no  murmurs, no rubs, no clicks Abd:  soft, positive bowel sounds, no organomegally, no rebound, no guarding Ext:  2 plus pulses, no edema, no cyanosis, no clubbing Skin:  No rashes no nodules Neuro:  CN II through XII intact, motor grossly intact  EKG - NSR with ventricular pacing  DEVICE  Normal device function.  See PaceArt for details.   Assess/Plan:  CHB - she is asymptomatic s/p PPM insertion. PPM - her [employer] DDD PM is workoing normally. She wil lundergo recheck in several months. HTN - her bp is well controlled. No change in meds. Syncope - none since her PPM lead extraction and re-insertion.  Kari Overlie Ayomide Purdy,MD

## 2022-07-11 ENCOUNTER — Telehealth: Payer: Self-pay | Admitting: Internal Medicine

## 2022-07-11 NOTE — Telephone Encounter (Signed)
Pt was called to clarify question regarding her Xarelto 20 mg.   Pt stated she does not want to take medication she does not need.   Pt started taking Eliquis then switched to Xarelto.  She was confused when I told her Dr. Lovena Le wanted her to stay on Xarelto another 3 months, then stop the medication.  Pt wanted me to message Dr. Lovena Le to confirm "another" 3 months - then STOP Xarelto.    I told patient I will clarify with Dr. Lovena Le, and message her back... f/u required.

## 2022-07-11 NOTE — Telephone Encounter (Signed)
Pt c/o medication issue:  1. Name of Medication:   rivaroxaban (XARELTO) 20 MG TABS tablet    2. How are you currently taking this medication (dosage and times per day)? As prescribed.   3. Are you having a reaction (difficulty breathing--STAT)?   4. What is your medication issue? Pt would like a call back to discuss if she is suppose to continue taking this medication. Please advise.

## 2022-07-16 NOTE — Telephone Encounter (Signed)
Please confirm that she stay on for another 3 months which will reduce the risk of recurrent arm swelling. If she has any bleeding she will need to stop xarelto.

## 2022-07-23 NOTE — Telephone Encounter (Signed)
Called patient and left voicemail message confirming, the order for patient to continue Xarelto for another 3 months; to reduce the chance of recurrent arm swelling. Pt was told to stop Xarelto, only if bleeding is present.  Pt told to call [address] with questions.

## 2022-07-29 ENCOUNTER — Other Ambulatory Visit (HOSPITAL_COMMUNITY): Payer: Self-pay

## 2022-08-05 ENCOUNTER — Other Ambulatory Visit (HOSPITAL_COMMUNITY): Payer: Self-pay

## 2022-08-05 ENCOUNTER — Other Ambulatory Visit: Payer: Self-pay | Admitting: Family Medicine

## 2022-08-05 MED ORDER — VALACYCLOVIR HCL 1 G PO TABS
1.0000 g | ORAL_TABLET | Freq: Every day | ORAL | 2 refills | Status: DC | PRN
  Filled 2022-08-05: qty 90, 90d supply, fill #0
  Filled 2023-03-15: qty 90, 90d supply, fill #1
  Filled 2023-05-21 – 2023-05-26 (×3): qty 90, 90d supply, fill #2

## 2022-09-07 ENCOUNTER — Other Ambulatory Visit (HOSPITAL_BASED_OUTPATIENT_CLINIC_OR_DEPARTMENT_OTHER): Payer: Self-pay

## 2022-09-07 ENCOUNTER — Emergency Department (HOSPITAL_BASED_OUTPATIENT_CLINIC_OR_DEPARTMENT_OTHER)
Admission: EM | Admit: 2022-09-07 | Discharge: 2022-09-07 | Disposition: A | Discharge status: Home / Self Care | Payer: Commercial Managed Care - PPO | Attending: Emergency Medicine | Admitting: Emergency Medicine

## 2022-09-07 ENCOUNTER — Emergency Department (HOSPITAL_BASED_OUTPATIENT_CLINIC_OR_DEPARTMENT_OTHER): Payer: Commercial Managed Care - PPO

## 2022-09-07 ENCOUNTER — Other Ambulatory Visit: Payer: Self-pay

## 2022-09-07 ENCOUNTER — Encounter (HOSPITAL_BASED_OUTPATIENT_CLINIC_OR_DEPARTMENT_OTHER): Payer: Self-pay

## 2022-09-07 ENCOUNTER — Emergency Department (HOSPITAL_BASED_OUTPATIENT_CLINIC_OR_DEPARTMENT_OTHER): Payer: Commercial Managed Care - PPO | Admitting: Radiology

## 2022-09-07 DIAGNOSIS — Z20822 Contact with and (suspected) exposure to covid-19: Secondary | ICD-10-CM | POA: Insufficient documentation

## 2022-09-07 DIAGNOSIS — R251 Tremor, unspecified: Secondary | ICD-10-CM | POA: Diagnosis present

## 2022-09-07 DIAGNOSIS — R002 Palpitations: Secondary | ICD-10-CM | POA: Insufficient documentation

## 2022-09-07 DIAGNOSIS — Z7901 Long term (current) use of anticoagulants: Secondary | ICD-10-CM | POA: Diagnosis not present

## 2022-09-07 DIAGNOSIS — R519 Headache, unspecified: Secondary | ICD-10-CM | POA: Insufficient documentation

## 2022-09-07 DIAGNOSIS — R202 Paresthesia of skin: Secondary | ICD-10-CM | POA: Diagnosis not present

## 2022-09-07 LAB — TSH: TSH: 4.074 u[IU]/mL (ref 0.350–4.500)

## 2022-09-07 LAB — CBC
HCT: 40.3 % (ref 36.0–46.0)
Hemoglobin: 13.3 g/dL (ref 12.0–15.0)
MCH: 30.6 pg (ref 26.0–34.0)
MCHC: 33 g/dL (ref 30.0–36.0)
MCV: 92.6 fL (ref 80.0–100.0)
Platelets: 265 10*3/uL (ref 150–400)
RBC: 4.35 MIL/uL (ref 3.87–5.11)
RDW: 16.1 % — ABNORMAL HIGH (ref 11.5–15.5)
WBC: 5.9 10*3/uL (ref 4.0–10.5)
nRBC: 0 % (ref 0.0–0.2)

## 2022-09-07 LAB — RESP PANEL BY RT-PCR (RSV, FLU A&B, COVID)  RVPGX2
Influenza A by PCR: NEGATIVE
Influenza B by PCR: NEGATIVE
Resp Syncytial Virus by PCR: NEGATIVE
SARS Coronavirus 2 by RT PCR: NEGATIVE

## 2022-09-07 LAB — BASIC METABOLIC PANEL
Anion gap: 11 (ref 5–15)
BUN: 13 mg/dL (ref 8–23)
CO2: 28 mmol/L (ref 22–32)
Calcium: 10.1 mg/dL (ref 8.9–10.3)
Chloride: 101 mmol/L (ref 98–111)
Creatinine, Ser: 0.71 mg/dL (ref 0.44–1.00)
GFR, Estimated: >60 mL/min (ref >60)
Glucose, Bld: 114 mg/dL — ABNORMAL HIGH (ref 70–99)
Potassium: 3.6 mmol/L (ref 3.5–5.1)
Sodium: 140 mmol/L (ref 135–145)

## 2022-09-07 LAB — MAGNESIUM: Magnesium: 2 mg/dL (ref 1.7–2.4)

## 2022-09-07 LAB — TROPONIN I (HIGH SENSITIVITY): Troponin I (High Sensitivity): 5 ng/L (ref <18)

## 2022-09-07 MED ORDER — ACETAMINOPHEN 500 MG PO TABS: 1000.0000 mg | ORAL_TABLET | Freq: Once | ORAL | Status: DC

## 2022-09-07 NOTE — ED Notes (Signed)
Pacer interrogation complete. Will expect an auto-send fax report within 15 min.

## 2022-09-07 NOTE — ED Triage Notes (Signed)
Patient here POV from Home.  Endorses Shaking and General Uneasiness that began shortly after Morning Workout and Coffee. No CP. No SOB. No N/V. Some Headache, mostly Left Sided.    NAD Noted during Triage. A&Ox4. GCS 15. Ambulatory.

## 2022-09-07 NOTE — ED Provider Notes (Signed)
[facility] EMERGENCY DEPT Provider Note   CSN: [id number] Arrival date & time: 09/07/22  1157     History  Chief Complaint  Patient presents with   Shaking    Kari [name] is a 72 y.o. female.  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  Patient reports that she woke up drink 20 ounces of coffee which is normal for her and then proceeded to work out.  Reports that she did both 3 weights and was on a treadmill and during this started to feel very jittery.  Says that she had diffuse body shaking.  Says that it lasted until she came into the emergency department and then calm down.  Also reports a left-sided headache.  Says it is mild.  No associated phono or photophobia.  No chest pain or shortness of breath during this episode.  Did not pass out.  No recent illnesses and denies any nausea, vomiting, or diarrhea.  No melena or hematochezia.  No urinary symptoms.  Has started a new supplement called Celsius that she drinks twice per day and 20 ounces of water.  Does work night shift.       Home Medications Prior to Admission medications   Medication Sig Start Date End Date Taking? Authorizing Provider  acetaminophen (TYLENOL) 325 MG tablet Take 650 mg by mouth every 6 (six) hours as needed for moderate pain or fever.    [provider]  CALCIUM PO Take 1 tablet by mouth daily.    [provider]  Cholecalciferol (VITAMIN D3) 125 MCG (5000 UT) CAPS Take 5,000 Units by mouth daily.    [provider]  Cyanocobalamin (VITAMIN B-12) 5000 MCG SUBL Place 5,000 mcg under the tongue daily.    [provider]  diclofenac sodium (VOLTAREN) 1 % GEL Apply 1-2 g topically 4 (four) times daily as needed (knee pain.).    [provider]  famotidine (PEPCID) 40 MG tablet Take 40 mg by mouth  every morning.    [provider]  fluticasone (FLONASE) 50 MCG/ACT nasal spray Place 1 spray into both nostrils daily as needed for allergies or rhinitis.    [provider]  Melatonin 3 MG TABS Take 6 mg by mouth at bedtime as needed (sleep.).    [provider]  Multiple Vitamin (MULTIVITAMIN WITH MINERALS) TABS Take 1 tablet by mouth daily.    [provider]  Multiple Vitamins-Minerals (AIRBORNE GUMMIES PO) Take 2 each by mouth daily.    [provider]  Omega-3 Fatty Acids (FISH OIL ULTRA) 1400 MG CAPS Take 1,400 mg by mouth daily.    [provider]  Probiotic Product (PROBIOTIC FORMULA PO) Take 1 capsule by mouth daily.    [provider]  rivaroxaban (XARELTO) 20 MG TABS tablet Take 1 tablet (20 mg total) by mouth daily with supper. 04/24/22   Evans Lance, MD  rosuvastatin (CRESTOR) 5 MG tablet Take 1 tablet (5 mg total) by mouth daily. 07/01/22   Shelda Pal, DO  valACYclovir (VALTREX) 1000 MG tablet Take 1 tablet (1,000 mg total) by mouth daily as needed. 08/05/22   Shelda Pal, DO      Allergies    Eliquis [apixaban], Bactrim [sulfamethoxazole-trimethoprim], Ceftin, Vancomycin, and Levofloxacin    Review of Systems   Review of Systems  Physical Exam Updated Vital Signs BP Marland Kitchen)  173/95 (BP Location: Right Arm)   Pulse 72   Temp (!) 97.5 F (36.4 C)   Resp 17   Ht '5\' 8"'$  (1.727 m)   Wt 64.9 kg   SpO2 99%   BMI 21.76 kg/m  Physical Exam Vitals and nursing note reviewed.  Constitutional:      General: She is not in acute distress.    Appearance: She is well-developed.  HENT:     Head: Normocephalic and atraumatic.     Right Ear: External ear normal.     Left Ear: External ear normal.     Nose: Nose normal.  Eyes:     Extraocular Movements: Extraocular movements intact.     Conjunctiva/sclera: Conjunctivae normal.     Pupils: Pupils are equal, round, and reactive to light.   Cardiovascular:     Rate and Rhythm: Normal rate and regular rhythm.     Heart sounds: No murmur heard.    Comments: Implanted pacemaker in left upper chest wall Pulmonary:     Effort: Pulmonary effort is normal. No respiratory distress.     Breath sounds: Normal breath sounds.  Musculoskeletal:     Cervical back: Normal range of motion and neck supple.     Right lower leg: No edema.     Left lower leg: No edema.  Skin:    General: Skin is warm and dry.  Neurological:     Mental Status: She is alert and oriented to person, place, and time. Mental status is at baseline.     Comments: MENTAL STATUS: AAOx3 CRANIAL NERVES: II: Pupils equal and reactive 3 mm BL, no RAPD, no VF deficits III, IV, VI: EOM intact, no gaze preference or deviation, no nystagmus. V: normal sensation to light touch in V1, V2, and V3 segments bilaterally VII: no facial weakness or asymmetry, no nasolabial fold flattening VIII: normal hearing to speech and finger friction IX, X: normal palatal elevation, no uvular deviation XI: 5/5 head turn and 5/5 shoulder shrug bilaterally XII: midline tongue protrusion MOTOR: 5/5 strength in R shoulder flexion, elbow flexion and extension, and grip strength. 5/5 strength in L shoulder flexion, elbow flexion and extension, and grip strength.  5/5 strength in R hip and knee flexion, knee extension, ankle plantar and dorsiflexion. 5/5 strength in L hip and knee flexion, knee extension, ankle plantar and dorsiflexion. SENSORY: Normal sensation to light touch in all extremities COORD: Normal finger to nose and heel to shin, no tremor, no dysmetria STATION: normal stance, no truncal ataxia GAIT: Normal   Psychiatric:        Mood and Affect: Mood normal.     ED Results / Procedures / Treatments   Labs (all labs ordered are listed, but only abnormal results are displayed) Labs Reviewed  BASIC METABOLIC PANEL - Abnormal; Notable for the following components:      Result  Value   Glucose, Bld 114 (*)    All other components within normal limits  CBC - Abnormal; Notable for the following components:   RDW 16.1 (*)    All other components within normal limits  RESP PANEL BY RT-PCR (RSV, FLU A&B, COVID)  RVPGX2  MAGNESIUM  TSH  TROPONIN I (HIGH SENSITIVITY)  TROPONIN I (HIGH SENSITIVITY)    EKG EKG Interpretation  Date/Time:  Sunday September 07 2022 12:24:39 EST Ventricular Rate:  72 PR Interval:  204 QRS Duration: 144 QT Interval:  428 QTC Calculation: 468 R Axis:   81 Text Interpretation: Normal sinus rhythm  Left bundle branch block Abnormal ECG When compared with ECG of 01-Apr-2022 09:52, PREVIOUS ECG IS PRESENT Confirmed by Margaretmary Eddy [id number]) on 09/07/2022 2:07:45 PM  Radiology DG Chest 2 View  Result Date: 09/07/2022 CLINICAL DATA:  Left arm tingling. Shaking and generalized uneasiness. EXAM: CHEST - 2 VIEW COMPARISON:  One-view chest x-ray 04/01/2022 FINDINGS: Heart size is normal. Pacing wires are stable. Lungs are clear. No edema or effusion is present. The visualized soft tissues and bony thorax are unremarkable. IMPRESSION: No acute cardiopulmonary disease. Electronically Signed   By: San Morelle M.D.   On: 09/07/2022 12:58   CT Head Wo Contrast  Result Date: 09/07/2022 CLINICAL DATA:  Headache, new onset. EXAM: CT HEAD WITHOUT CONTRAST TECHNIQUE: Contiguous axial images were obtained from the base of the skull through the vertex without intravenous contrast. RADIATION DOSE REDUCTION: This exam was performed according to the departmental dose-optimization program which includes automated exposure control, adjustment of the mA and/or kV according to patient size and/or use of iterative reconstruction technique. COMPARISON:  CT head without contrast 03/20/2022 FINDINGS: Brain: No acute infarct, hemorrhage, or mass lesion is present. No significant white matter lesions are present. Deep brain nuclei are within normal limits. The  ventricles are of normal size. No significant extraaxial fluid collection is present. The brainstem and cerebellum are within normal limits. Midline structures are within normal limits. Vascular: No hyperdense vessel or unexpected calcification. Skull: Calvarium is intact. No focal lytic or blastic lesions are present. No significant extracranial soft tissue lesion is present. Sinuses/Orbits: The paranasal sinuses and mastoid air cells are clear. The globes and orbits are within normal limits. IMPRESSION: Negative CT of the head. Electronically Signed   By: San Morelle M.D.   On: 09/07/2022 12:57    Procedures Procedures   Medications Ordered in ED Medications - No data to display  ED Course/ Medical Decision Making/ A&P Clinical Course as of 09/07/22 1640  Sun Sep 07, 2022  1537 Stable 32 YOF with a chief complaint of tremulousness today. Interrogation of pacemaker pending. HX of LUE DVT treated with Xarelto. Offered CTV per prior provider.  [CC]  6440 Discussed with cardiology. Reviewed labs, EKG, and pacer interrogation.  Unrevealing. Appears WNL.  They feel comfortable with OP care and management per all results. Recommended calling clinic in AM. [CC]  3474 Reevaluated at bedside.  Asymptomatic.  Discussed with cardiology, they feel comfortable outpatient care and management as below [CC]    Clinical Course User Index [CC] Tretha Sciara, MD                            Medical Decision Making  [place] is a 72 y.o. female with comorbidities that complicate the patient evaluation including complete heart block with [employer] DDD pacemaker and left upper extremity DVT on Xarelto who presents to the emergency department with shakiness.   Initial Ddx:  Energy drink side effect, arrhythmia, electrolyte abnormality, MI, rigors, seizures, ICH, migraine  MDM:  The patient may be having symptoms from her energy drink which she recently started.  Also on the  differential would be arrhythmia so we will interrogate her pacemaker.  No symptoms of MI but will obtain troponin as well to ensure this is not an atypical presentation of MI. No infectious symptoms to suggest rigors as the cause. Non-focal neuro exam and with retained consciousness so seizures unlikely.  With her headache unclear what is causing it but  will obtain head CT since she is on blood thinners and does not typically get headaches.  Plan:  Labs  TSH Troponin EKG Chest x-ray CT head Pacemaker interrogation  ED Summary/Re-evaluation:  Patient monitored in the emergency department and remained stable.  She declined pain medication for headache.  Above workup was unremarkable and pacemaker interrogation did not show any evidence of V. tach did show that she had to be paced consistently which is not abnormal for her.  Cardiology was paged and she was signed out to Dr. Oswald Hillock awaiting final disposition and cardiology recommendations.  This patient presents to the ED for concern of complaints listed in HPI, this involves an extensive number of treatment options, and is a complaint that carries with it a high risk of complications and morbidity. Disposition including potential need for admission considered.   Dispo: Pending remainder of workup  Records reviewed ED Visit Notes The following labs were independently interpreted: Chemistry and CBC and show no acute abnormality I independently reviewed the following imaging with scope of interpretation limited to determining acute life threatening conditions related to emergency care: Chest x-ray and agree with the radiologist interpretation with the following exceptions: none I personally reviewed and interpreted cardiac monitoring:  Paced rhythm I personally reviewed and interpreted the pt's EKG: see above for interpretation  I have reviewed the patients home medications and made adjustments as needed Consults: Cardiology   Final  Clinical Impression(s) / ED Diagnoses Final diagnoses:  Palpitations    Rx / DC Orders ED Discharge Orders     None         Fransico Meadow, MD 09/07/22 1640

## 2022-09-07 NOTE — ED Provider Notes (Signed)
Clinical Course as of 09/07/22 2304  Kari [name] Sep 07, 2022  1537 Stable 54 YOF with a chief complaint of tremulousness today. Interrogation of pacemaker pending. HX of LUE DVT treated with Xarelto. Offered CTV per prior provider.  [CC]  0689 Discussed with cardiology. Reviewed labs, EKG, and pacer interrogation.  Unrevealing. Appears WNL.  They feel comfortable with OP care and management per all results. Recommended calling clinic in AM. [CC]  3406 Reevaluated at bedside.  Asymptomatic.  Discussed with cardiology, they feel comfortable outpatient care and management as below [CC]    Clinical Course User Index [CC] Tretha Sciara, MD      Tretha Sciara, MD 09/07/22 2304

## 2022-09-07 NOTE — ED Provider Triage Note (Signed)
Emergency Medicine Provider Triage Evaluation Note  Kari [name] , a 72 y.o. female  was evaluated in triage.  Pt complains of woke up and did work out, had coffee and 1 hour later felt shaky and not feeling well, slight left side headache (still present), no history of similar symptoms previously. Since August, had pacemaker replaced for bad lead, had bleeding, on thinners.  Has [employer] device  Review of Systems  Positive: As above Negative: As above, CP, SHOB, cough, congestion, nausea, vomiting   Physical Exam  BP (!) 173/95 (BP Location: Right Arm)   Pulse 72   Temp (!) 97.5 F (36.4 C)   Resp 17   Ht '5\' 8"'$  (1.727 m)   Wt 64.9 kg   SpO2 99%   BMI 21.76 kg/m  Gen:   Awake, no distress   Resp:  Normal effort  MSK:   Moves extremities without difficulty  Other:    Medical Decision Making  Medically screening exam initiated at 12:21 PM.  Appropriate orders placed.  [place] was informed that the remainder of the evaluation will be completed by another provider, this initial triage assessment does not replace that evaluation, and the importance of remaining in the ED until their evaluation is complete.     Tacy Learn, PA-C 09/07/22 1230

## 2022-09-08 ENCOUNTER — Emergency Department (HOSPITAL_COMMUNITY)
Admission: EM | Admit: 2022-09-08 | Discharge: 2022-09-08 | Disposition: A | Discharge status: Home / Self Care | Payer: Commercial Managed Care - PPO | Attending: Emergency Medicine | Admitting: Emergency Medicine

## 2022-09-08 ENCOUNTER — Encounter (HOSPITAL_COMMUNITY): Payer: Self-pay | Admitting: Emergency Medicine

## 2022-09-08 ENCOUNTER — Other Ambulatory Visit: Payer: Self-pay

## 2022-09-08 DIAGNOSIS — R531 Weakness: Secondary | ICD-10-CM | POA: Diagnosis not present

## 2022-09-08 DIAGNOSIS — I10 Essential (primary) hypertension: Secondary | ICD-10-CM | POA: Diagnosis not present

## 2022-09-08 DIAGNOSIS — R002 Palpitations: Secondary | ICD-10-CM | POA: Diagnosis not present

## 2022-09-08 DIAGNOSIS — Z950 Presence of cardiac pacemaker: Secondary | ICD-10-CM | POA: Insufficient documentation

## 2022-09-08 DIAGNOSIS — R000 Tachycardia, unspecified: Secondary | ICD-10-CM | POA: Diagnosis not present

## 2022-09-08 DIAGNOSIS — Z7901 Long term (current) use of anticoagulants: Secondary | ICD-10-CM | POA: Insufficient documentation

## 2022-09-08 DIAGNOSIS — I499 Cardiac arrhythmia, unspecified: Secondary | ICD-10-CM | POA: Diagnosis not present

## 2022-09-08 LAB — CBG MONITORING, ED: Glucose-Capillary: 103 mg/dL — ABNORMAL HIGH (ref 70–99)

## 2022-09-08 MED ORDER — METOPROLOL SUCCINATE ER 25 MG PO TB24: 12.5000 mg | ORAL_TABLET | Freq: Every day | ORAL | 0 refills | Status: DC

## 2022-09-08 NOTE — ED Provider Notes (Signed)
[facility] EMERGENCY DEPARTMENT Provider Note   CSN: [id number] Arrival date & time: 09/08/22  1808     History  No chief complaint on file.   Kari [name] is a 72 y.o. female.  HPI Patient presents after episode of feeling bad.  History of similar episode yesterday.  The not immediately after exertion but had been working out then went a while without exercise and then began to feel the episode.  Felt her heart going fast.  Reportedly checked her pulse and it was 120 bpm.  Feeling better now.  Similar episode yesterday and had pacemaker interrogated with no clear cause.  Sees Dr. Lovena Le for cardiology.  Is on blood thinners.  No fevers.  Does feel anxiety at times but states he does not feel better now.  Blood pressure is also elevated and states that is unusual for her.   Past Medical History:  Diagnosis Date   Arthritis    Chronic back pain    Diverticulitis    DVT (deep venous thrombosis) [facility]) age 48   s/p  knee arthroscopy    Herpes zoster conjunctivitis    Menopause    Mobitz type 2 second degree AV block    2:1/notes 02/16/2013   PONV (postoperative nausea and vomiting)    Presence of permanent cardiac pacemaker    Seasonal allergies    "spring & fall; not q year" (02/17/2013)   Shoulder impingement    and left rotator cuff tear    Home Medications Prior to Admission medications   Medication Sig Start Date End Date Taking? Authorizing Provider  metoprolol succinate (TOPROL-XL) 25 MG 24 hr tablet Take 0.5 tablets (12.5 mg total) by mouth daily. 09/08/22  Yes Davonna Belling, MD  acetaminophen (TYLENOL) 325 MG tablet Take 650 mg by mouth every 6 (six) hours as needed for moderate pain or fever.    [provider]  CALCIUM PO Take 1 tablet by mouth daily.    [provider]  Cholecalciferol (VITAMIN D3) 125 MCG (5000 UT) CAPS Take 5,000 Units by mouth daily.    [provider]  Cyanocobalamin (VITAMIN B-12) 5000 MCG  SUBL Place 5,000 mcg under the tongue daily.    [provider]  diclofenac sodium (VOLTAREN) 1 % GEL Apply 1-2 g topically 4 (four) times daily as needed (knee pain.).    [provider]  famotidine (PEPCID) 40 MG tablet Take 40 mg by mouth every morning.    [provider]  fluticasone (FLONASE) 50 MCG/ACT nasal spray Place 1 spray into both nostrils daily as needed for allergies or rhinitis.    [provider]  Melatonin 3 MG TABS Take 6 mg by mouth at bedtime as needed (sleep.).    [provider]  Multiple Vitamin (MULTIVITAMIN WITH MINERALS) TABS Take 1 tablet by mouth daily.    [provider]  Multiple Vitamins-Minerals (AIRBORNE GUMMIES PO) Take 2 each by mouth daily.    [provider]  Omega-3 Fatty Acids (FISH OIL ULTRA) 1400 MG CAPS Take 1,400 mg by mouth daily.    [provider]  Probiotic Product (PROBIOTIC FORMULA PO) Take 1 capsule by mouth daily.    [provider]  rivaroxaban (XARELTO) 20 MG TABS tablet Take 1 tablet (20 mg total) by mouth daily with supper. 04/24/22   Evans Lance, MD  rosuvastatin (CRESTOR) 5 MG tablet Take 1 tablet (5 mg total) by mouth daily. 07/01/22   Shelda Pal, DO  valACYclovir (VALTREX) 1000 MG tablet Take 1 tablet (1,000 mg total) by mouth daily as needed. 08/05/22   Shelda Pal, DO      Allergies    Eliquis [apixaban], Bactrim [sulfamethoxazole-trimethoprim], Ceftin, Vancomycin, and Levofloxacin    Review of Systems   Review of Systems  Physical Exam Updated Vital Signs BP 138/71   Pulse 65   Temp 97.6 F (36.4 C) (Oral)   Resp 14   Ht '5\' 8"'$  (1.727 m)   Wt 65 kg   SpO2 100%   BMI 21.79 kg/m  Physical Exam Vitals and nursing note reviewed.  HENT:     Head: Atraumatic.  Cardiovascular:     Rate and Rhythm: Regular rhythm.  Pulmonary:     Effort: Pulmonary effort is normal.  Abdominal:     Tenderness: There is no abdominal  tenderness.  Musculoskeletal:     Cervical back: Neck supple.  Skin:    General: Skin is warm.     Capillary Refill: Capillary refill takes less than 2 seconds.  Neurological:     Mental Status: She is alert and oriented to person, place, and time.     ED Results / Procedures / Treatments   Labs (all labs ordered are listed, but only abnormal results are displayed) Labs Reviewed  CBG MONITORING, ED - Abnormal; Notable for the following components:      Result Value   Glucose-Capillary 103 (*)    All other components within normal limits    EKG EKG Interpretation  Date/Time:  Monday September 08 2022 18:27:44 EST Ventricular Rate:  79 PR Interval:  215 QRS Duration: 128 QT Interval:  410 QTC Calculation: 470 R Axis:   82 Text Interpretation: paced Borderline prolonged PR interval IVCD, consider atypical LBBB Confirmed by Davonna Belling [phone]) on 09/08/2022 6:37:08 PM  Radiology DG Chest 2 View  Result Date: 09/07/2022 CLINICAL DATA:  Left arm tingling. Shaking and generalized uneasiness. EXAM: CHEST - 2 VIEW COMPARISON:  One-view chest x-ray 04/01/2022 FINDINGS: Heart size is normal. Pacing wires are stable. Lungs are clear. No edema or effusion is present. The visualized soft tissues and bony thorax are unremarkable. IMPRESSION: No acute cardiopulmonary disease. Electronically Signed   By: San Morelle M.D.   On: 09/07/2022 12:58   CT Head Wo Contrast  Result Date: 09/07/2022 CLINICAL DATA:  Headache, new onset. EXAM: CT HEAD WITHOUT CONTRAST TECHNIQUE: Contiguous axial images were obtained from the base of the skull through the vertex without intravenous contrast. RADIATION DOSE REDUCTION: This exam was performed according to the departmental dose-optimization program which includes automated exposure control, adjustment of the mA and/or kV according to patient size and/or use of iterative reconstruction technique. COMPARISON:  CT head without contrast 03/20/2022  FINDINGS: Brain: No acute infarct, hemorrhage, or mass lesion is present. No significant white matter lesions are present. Deep brain nuclei are within normal limits. The ventricles are of normal size. No significant extraaxial fluid collection is present. The brainstem and cerebellum are within normal limits. Midline structures are within normal limits. Vascular: No hyperdense vessel or unexpected calcification. Skull: Calvarium is intact. No focal lytic or blastic lesions are present. No significant extracranial soft tissue lesion is present. Sinuses/Orbits: The paranasal sinuses and mastoid air cells are clear. The globes and orbits are within normal limits. IMPRESSION: Negative CT of the head. Electronically Signed   By: San Morelle M.D.   On: 09/07/2022 12:57    Procedures Procedures    Medications Ordered in ED  Medications - No data to display  ED Course/ Medical Decision Making/ A&P                             Medical Decision Making Risk Prescription drug management.   Patient with episode of tachycardia and feeling bad.  Similar episode yesterday.  Lab work reassuring yesterday.  Repeated sugar since it was slightly elevated and still overall reassuring.  Discussed with cardiology Dr. Nancy Nordmann.  Pacemaker reinterrogated.  Overall reassuring interrogation but she later called back and said that potentially could be an atrial tachycardia.  Suggested starting metoprolol.  Will have short-term follow-up with the EP.  Does not appear to require admission in the hospital.  Overall appears stable.  Does not appear to be ventricular tachycardia or atrial fibrillation.  Will discharge home.        Final Clinical Impression(s) / ED Diagnoses Final diagnoses:  Palpitations    Rx / DC Orders ED Discharge Orders          Ordered    metoprolol succinate (TOPROL-XL) 25 MG 24 hr tablet  Daily        09/08/22 Clenton Pare, MD 09/08/22 2150

## 2022-09-08 NOTE — Discharge Instructions (Signed)
They should be able to get you in the device clinic tomorrow.

## 2022-09-08 NOTE — Plan of Care (Signed)
Patient is a 71 y/o F known to have dual chamber PPM presented with episodic palpitations. [place] interrogation strips reviewed, no alerts noted. Patient did have elevation in Hrs between 100-135 multiple times and it is unclear if she is having any atrial tachycardia or if MTR / upper tracking rate needs to be programmed. Discussed with Dr Quentin Ore, EP on call who recommended patient to be seen in the EP clinic for any device programming. Start Metoprolol tartarate 12.5 mg BID and follow up with EP device clinic in one week (Scheduler from [address] alerted).  Kari Seats Fidel Levy, MD [place]  8:06 PM

## 2022-09-08 NOTE — ED Triage Notes (Signed)
Pt arrives via EMS with reports of feeling bad after working out. Pacemaker replaced in August. Pt had episode yesterday and was seen at [facility].

## 2022-09-08 NOTE — ED Notes (Signed)
Pacemaker interrogating

## 2022-09-08 NOTE — ED Notes (Signed)
Pt reports seeing Crissie Sickles for cardiology

## 2022-09-09 ENCOUNTER — Ambulatory Visit (INDEPENDENT_AMBULATORY_CARE_PROVIDER_SITE_OTHER): Payer: Commercial Managed Care - PPO

## 2022-09-09 ENCOUNTER — Ambulatory Visit: Payer: Commercial Managed Care - PPO | Attending: Physician Assistant | Admitting: Physician Assistant

## 2022-09-09 ENCOUNTER — Encounter: Payer: Self-pay | Admitting: Internal Medicine

## 2022-09-09 ENCOUNTER — Encounter: Payer: Self-pay | Admitting: Physician Assistant

## 2022-09-09 ENCOUNTER — Telehealth: Payer: Self-pay | Admitting: Internal Medicine

## 2022-09-09 VITALS — BP 150/88 | HR 62 | Ht 68.0 in | Wt 153.0 lb

## 2022-09-09 DIAGNOSIS — I10 Essential (primary) hypertension: Secondary | ICD-10-CM | POA: Diagnosis not present

## 2022-09-09 DIAGNOSIS — Z950 Presence of cardiac pacemaker: Secondary | ICD-10-CM | POA: Diagnosis not present

## 2022-09-09 DIAGNOSIS — R55 Syncope and collapse: Secondary | ICD-10-CM

## 2022-09-09 DIAGNOSIS — R002 Palpitations: Secondary | ICD-10-CM

## 2022-09-09 LAB — CUP PACEART INCLINIC DEVICE CHECK
Date Time Interrogation Session: 20240116130632
Implantable Lead Connection Status: 753985
Implantable Lead Connection Status: 753985
Implantable Lead Implant Date: 20230731
Implantable Lead Implant Date: 20230731
Implantable Lead Location: 753859
Implantable Lead Location: 753860
Implantable Lead Model: 7841
Implantable Lead Model: 7842
Implantable Lead Serial Number: 1215821
Implantable Lead Serial Number: 1304218
Implantable Pulse Generator Implant Date: 20230731
Lead Channel Pacing Threshold Amplitude: 0.5 V
Lead Channel Pacing Threshold Amplitude: 1 V
Lead Channel Pacing Threshold Pulse Width: 0.4 ms
Lead Channel Pacing Threshold Pulse Width: 0.6 ms
Lead Channel Sensing Intrinsic Amplitude: 7.5 mV
Pulse Gen Serial Number: 600527

## 2022-09-09 NOTE — Telephone Encounter (Signed)
Error

## 2022-09-09 NOTE — Patient Instructions (Addendum)
Medication Instructions:   Your physician recommends that you continue on your current medications as directed. Please refer to the Current Medication list given to you today.  *If you need a refill on your cardiac medications before your next appointment, please call your pharmacy*   Lab Work:  [name]    If you have labs (blood work) drawn today and your tests are completely normal, you will receive your results only by: [name] (if you have MyChart) OR A paper copy in the mail If you have any lab test that is abnormal or we need to change your treatment, we will call you to review the results.   Testing/Procedures: Your physician has recommended that you wear an event monitor. Event monitors are medical devices that record the heart's electrical activity. Doctors most often [place] these monitors to diagnose arrhythmias. Arrhythmias are problems with the speed or rhythm of the heartbeat. The monitor is a small, portable device. You can wear one while you do your normal daily activities. This is usually used to diagnose what is causing palpitations/syncope (passing out).    Follow-Up: At [facility], you and your health needs are our priority.  As part of our continuing mission to provide you with exceptional heart care, we have created designated Provider Care Teams.  These Care Teams include your primary Cardiologist (physician) and Advanced Practice Providers (APPs -  Physician Assistants and Nurse Practitioners) who all work together to provide you with the care you need, when you need it.  We recommend signing up for the patient portal called "MyChart".  Sign up information is provided on this After Visit Summary.  MyChart is used to connect with patients for Virtual Visits (Telemedicine).  Patients are able to view lab/test results, encounter notes, upcoming appointments, etc.  Non-urgent messages can be sent to your provider as well.   To learn more about  what you can do with MyChart, go to [email or web address].    Your next appointment:   1 month(s)  Provider:   You may   Kari Standard, PA-C  Other Instructions  ZIO AT Long term monitor-Live Telemetry  Your physician has requested you wear a ZIO patch monitor for 14 days.  This is a single patch monitor. [employer] supplies one patch monitor per enrollment. Additional  stickers are not available.  Please do not apply patch if you will be having a Nuclear Stress Test, Echocardiogram, Cardiac CT, MRI,  or Chest Xray during the period you would be wearing the monitor. The patch cannot be worn during  these tests. You cannot remove and re-apply the ZIO AT patch monitor.  Your ZIO patch monitor will be mailed 3 day USPS to your address on file. It may take 3-5 days to  receive your monitor after you have been enrolled.  Once you have received your monitor, please review the enclosed instructions. Your monitor has  already been registered assigning a specific monitor serial # to you.   Billing and Patient Assistance Program information  Kari [name] has been supplied with any insurance information on record for billing. [employer] offers a sliding scale Patient Assistance Program for patients without insurance, or whose  insurance does not completely cover the cost of the ZIO patch monitor. You must apply for the  Patient Assistance Program to qualify for the discounted rate. To apply, call [employer] at [phone],  select option 4, select option 2 , ask to apply for the Patient Assistance Program, (you can request an  interpreter if needed). [employer] will ask your household income and how many people are in your  household. [employer] will quote your out-of-pocket cost based on this information. They will also be able  to set up a 12 month interest free payment plan if needed.  Applying the monitor   Shave hair from upper left chest.  Hold the abrader disc by orange tab. Rub the abrader in 40  strokes over left upper chest as indicated in  your monitor instructions.  Clean area with 4 enclosed alcohol pads. Use all pads to ensure the area is cleaned thoroughly. Let  dry.  Apply patch as indicated in monitor instructions. Patch will be placed under collarbone on left side of  chest with arrow pointing upward.  Rub patch adhesive wings for 2 minutes. Remove the white label marked "1". Remove the white label  marked "2". Rub patch adhesive wings for 2 additional minutes.  While looking in a mirror, press and release button in center of patch. A small green light will flash 3-4  times. This will be your only indicator that the monitor has been turned on.  Do not shower for the first 24 hours. You may shower after the first 24 hours.  Press the button if you feel a symptom. You will hear a small click. Record Date, Time and Symptom in  the Patient Log.   Starting the Gateway  In your kit there is a Hydrographic surveyor box the size of a cellphone. This is Airline pilot. It transmits all your  recorded data to [facility]. This box must always stay within 10 feet of you. Open the box and push the *  button. There will be a light that blinks orange and then green a few times. When the light stops  blinking, the Gateway is connected to the ZIO patch. Call [employer] at [phone] to confirm your monitor is transmitting.  Returning your monitor  Remove your patch and place it inside the [place]. In the lower half of the Gateway there is a white  bag with prepaid postage on it. Place Gateway in bag and seal. Mail package back to [place] as soon as  possible. Your physician should have your final report approximately 7 days after you have mailed back  your monitor. Call [place] at [phone] if you have questions regarding your ZIO AT  patch monitor. Call them immediately if you see an orange light blinking on your monitor.  If your monitor falls off in less than 4  days, contact our Monitor department at [phone]. If your  monitor becomes loose or falls off after 4 days call [employer] at [phone] for suggestions on  securing your monitor

## 2022-09-09 NOTE — Telephone Encounter (Signed)
error 

## 2022-09-09 NOTE — Progress Notes (Addendum)
Cardiology Office Note Date:  09/09/2022  Patient ID:  Kari, [name] [birth year], MRN [id number] PCP:  Shelda Pal, DO  Electrophysiologist: Dr. Lovena Le    Chief Complaint:  post ER  History of Present Illness: Kari [name] is a 73 y.o. 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  She saw Dr. Lovena Le last 07/10/22, doing well, no recurrent syncope post device replacement.  No changes were made.  She went to the [facility] 09/07/22 with reports of feeling shaky/tremulous she was found hypertensive, 173/95, HR 72, afebrile, w/u in the ER done including CT head with no concerning findings, including pacer check apparently and discharged from the ER  A second ER visit 09/08/22, the same symptoms BP 138/71, HR 65 Noted she had started a new drink/supplenet otherwise her usual routine Pacemaker reportedly checked without concerning findings perhaps an AT and started on BB, otherwise again, reassuring findings LABS K+ 3.6 BUN/Creat 13/0.71 Mag 2.0 WBC 5.9 H/h 13/40 Plts 265 TSH 4.074  She reports that until Sunday she had been doing fine.  No symptoms or concerns. Sunday she got up did her usual meds, coffee, got on the treadmill, then some free weights nothing unusual. About 59mn later started to feel weak, shaky.  Says her whole body was shaking, not shivering, not like seizure activity, nut shaky.   Felt her heart with pounding beats, and found her pulse high 110's-120's and went to the ER, once there seemed to be settling. She was quite concerned about how high her BP was, but otherwise, agreed with the MD, that everything seemed good to her (she is a working ER MD)  YWilburn MylarAM the same thing happened, but she called EMS and was transported.  She took a 1/2 tab of the metoprolol today She does not have a home BP cuff and did not check her BP at home, but HRs were  via her pulse Ox Their initial BP 190/100, HR 110, 97% >> 190/119, 88bpm One tracing/ECG by them is SR 83 (V paced)   Device information BSCi dual chamber PPM implanted 03/24/22   Past Medical History:  Diagnosis Date   Arthritis    Chronic back pain    Diverticulitis    DVT (deep venous thrombosis) ([facility] age 72  s/p  knee arthroscopy    Herpes zoster conjunctivitis    Menopause    Mobitz type 2 second degree AV block    2:1/notes 02/16/2013   PONV (postoperative nausea and vomiting)    Presence of permanent cardiac pacemaker    Seasonal allergies    "spring & fall; not q year" (02/17/2013)   Shoulder impingement    and left rotator cuff tear    Past Surgical History:  Procedure Laterality Date   ACHILLES TENDON SURGERY     ARTHROGRAM KNEE Right 1975   CHOLECYSTECTOMY  1990's   colonscopy     ESOPHAGOGASTRODUODENOSCOPY     EYE SURGERY     muscle release right eye   INSERT / REPLACE / REMOVE PACEMAKER  02/17/2013   [employer] Advantio dual-chamber pacemaker, model [id number],   KNEE ARTHROSCOPY Right 1982, 1983, 1995, 1996   KNEE ARTHROSCOPY W/ ACL RECONSTRUCTION Left 1984   LAPAROSCOPIC SIGMOID COLECTOMY N/A 01/01/2015   Procedure: LAPAROSCOPIC SIGMOID COLECTOMY;  Surgeon: FStark Klein MD;  [place]  Service: General;  Laterality: N/A;  LEAD EXTRACTION N/A 03/24/2022   Procedure: LEAD EXTRACTION;  Surgeon: Evans Lance, MD;  Location: [facility];  Service: Cardiovascular;  Laterality: N/A;   PACEMAKER IMPLANT N/A 03/24/2022   Procedure: PACEMAKER IMPLANT;  Surgeon: Evans Lance, MD;  Location: [place] [facility];  Service: Cardiovascular;  Laterality: N/A;   PERMANENT PACEMAKER INSERTION N/A 02/17/2013   Procedure: PERMANENT PACEMAKER INSERTION;  Surgeon: Evans Lance, MD;  Location: [facility] CATH LAB;  Service: Cardiovascular;  Laterality: N/A;   SHOULDER ARTHROSCOPY Left 09/05/2016   Procedure: LEFT SHOULDER ARTHROSCOPY , ACROMIOPLASTY AND ROTATOR  CUFF REPAIR;  Surgeon: Melrose Nakayama, MD;  Location: [place];  Service: Orthopedics;  Laterality: Left;   TONSILLECTOMY  1961   TOTAL KNEE ARTHROPLASTY Right 2000; 06/2000   "replaced w/appropriate hardware" (02/17/2013)   TOTAL KNEE REVISION Right 04/13/2019   Procedure: Revision Right Knee Arthroplasty;  Surgeon: Frederik Pear, MD;  Location: [facility];  Service: Orthopedics;  Laterality: Right;   TRIGGER FINGER RELEASE Right ~ 2012   "thumb" (02/17/2013)   VAGINAL HYSTERECTOMY  ~ 1994   partial     Current Outpatient Medications  Medication Sig Dispense Refill   acetaminophen (TYLENOL) 325 MG tablet Take 650 mg by mouth every 6 (six) hours as needed for moderate pain or fever.     CALCIUM PO Take 1 tablet by mouth daily.     Cholecalciferol (VITAMIN D3) 125 MCG (5000 UT) CAPS Take 5,000 Units by mouth daily.     Cyanocobalamin (VITAMIN B-12) 5000 MCG SUBL Place 5,000 mcg under the tongue daily.     diclofenac sodium (VOLTAREN) 1 % GEL Apply 1-2 g topically 4 (four) times daily as needed (knee pain.).     famotidine (PEPCID) 40 MG tablet Take 40 mg by mouth every morning.     fluticasone (FLONASE) 50 MCG/ACT nasal spray Place 1 spray into both nostrils daily as needed for allergies or rhinitis.     Melatonin 3 MG TABS Take 6 mg by mouth at bedtime as needed (sleep.).     metoprolol succinate (TOPROL-XL) 25 MG 24 hr tablet Take 0.5 tablets (12.5 mg total) by mouth daily. 30 tablet 0   Multiple Vitamin (MULTIVITAMIN WITH MINERALS) TABS Take 1 tablet by mouth daily.     Multiple Vitamins-Minerals (AIRBORNE GUMMIES PO) Take 2 each by mouth daily.     Omega-3 Fatty Acids (FISH OIL ULTRA) 1400 MG CAPS Take 1,400 mg by mouth daily.     Probiotic Product (PROBIOTIC FORMULA PO) Take 1 capsule by mouth daily.     rivaroxaban (XARELTO) 20 MG TABS tablet Take 1 tablet (20 mg total) by mouth daily with supper. 30 tablet 6   rosuvastatin (CRESTOR) 5 MG tablet Take 1 tablet (5 mg total) by mouth daily. 30  tablet 3   valACYclovir (VALTREX) 1000 MG tablet Take 1 tablet (1,000 mg total) by mouth daily as needed. 90 tablet 2   No current facility-administered medications for this visit.    Allergies:   Eliquis [apixaban], Bactrim [sulfamethoxazole-trimethoprim], Ceftin, Vancomycin, and Levofloxacin   Social History:  The patient  reports that she has never smoked. She has never used smokeless tobacco. She reports current alcohol use. She reports that she does not use drugs.   Family History:  The patient's family history includes Breast cancer in her paternal aunt; COPD in her father and mother; Cancer in her father; Diabetes in her brother; Hyperlipidemia in her mother; Hypertension in her mother; Lung cancer in  her father; Macular degeneration in her mother.  ROS:  Please see the history of present illness.    All other systems are reviewed and otherwise negative.   PHYSICAL EXAM:  VS:  There were no vitals taken for this visit. BMI: There is no height or weight on file to calculate BMI. Well nourished, well developed, in no acute distress HEENT: normocephalic, atraumatic Neck: no JVD, carotid bruits or masses Cardiac:   RRR; no significant murmurs, no rubs, or gallops Lungs:  CTA b/l, no wheezing, rhonchi or rales Abd: soft, nontender MS: no deformity or atrophy Ext: no edema Skin: warm and dry, no rash Neuro:  No gross deficits appreciated Psych: euthymic mood, full affect  PPM site is stable, no tethering or discomfort   EKG:  not done today ER EKG reviewed: 09/08/22: SR 79bpm, V paced  Device interrogation done today and reviewed by myself:  Battery and lead measurements are good NO arrhythmias noted last 2 days, She did have an Aflutter on 06/06/22 of 27seconds, V paced No VT HR histograms only include as of yesterday's remote transmission  04/01/22: TTE  1. Left ventricular ejection fraction, by estimation, is 60 to 65%. The  left ventricle has normal function. The left  ventricle has no regional  wall motion abnormalities.   2. Right ventricular systolic function is normal. The right ventricular  size is normal. There is normal pulmonary artery systolic pressure. The  estimated right ventricular systolic pressure is 78.2 mmHg.   3. The mitral valve is normal in structure. No evidence of mitral  stenosis.   4. The aortic valve is grossly normal.   Recent Labs: 04/01/2022: ALT 15 09/07/2022: BUN 13; Creatinine, Ser 0.71; Hemoglobin 13.3; Magnesium 2.0; Platelets 265; Potassium 3.6; Sodium 140; TSH 4.074  04/17/2022: Cholesterol 125; HDL 46.00; LDL Cholesterol 49; Total CHOL/HDL Ratio 3; Triglycerides 152.0; VLDL 30.4   Estimated Creatinine Clearance: 67 mL/min (by C-G formula based on SCr of 0.71 mg/dL).   Wt Readings from Last 3 Encounters:  09/08/22 143 lb 4.8 oz (65 kg)  09/07/22 143 lb 1.3 oz (64.9 kg)  07/10/22 143 lb (64.9 kg)     Other studies reviewed: Additional studies/records reviewed today include: summarized above  ASSESSMENT AND PLAN:  PPM Intact function  2. Palpitations, weak/tremulous spells With industry support, confirms no arrhythmias, tachy episodes. Will have her wear a monitor given marked symptoms  NOTE: with a threshold testing at 80bpm, she had no awareness With V auto threshold testing she immediately felt the same pounding sensation, at 80bpm With manual threshold testing  at 120 and 155m AV delays she again reported pounding With pacing at 60bpm and high output she was unaware, vs minimal if any awareness. Unclear the connection, she is 100% V paced/dependent and otherwise unaware of her heart beat today.  3. New HTN? She took 1/2 tab metoprolol today BP elevated, but less then last couple days I advised her to continue it Asked her to invest in a BP cuff and monitor daily and periodic BPs   4. AFlutter One, in October, 27 seconds only Previously seen monitor  Disposition: F/u with u[place]oreain a month, sooner if  needed  Current medicines are reviewed at length with the patient today.  The patient did not have any concerns regarding medicines.  SVenetia Night PA-C 09/09/2022 8:44 AM     [place] [phone](office)  [phone])

## 2022-09-09 NOTE — Progress Notes (Unsigned)
ZIO AT serial # [id number] from office inventory applied to patient.  Dr.Taylor to read.

## 2022-09-10 DIAGNOSIS — R55 Syncope and collapse: Secondary | ICD-10-CM | POA: Diagnosis not present

## 2022-09-10 DIAGNOSIS — R002 Palpitations: Secondary | ICD-10-CM | POA: Diagnosis not present

## 2022-09-13 ENCOUNTER — Telehealth: Payer: Commercial Managed Care - PPO | Admitting: Nurse Practitioner

## 2022-09-13 DIAGNOSIS — J0100 Acute maxillary sinusitis, unspecified: Secondary | ICD-10-CM | POA: Diagnosis not present

## 2022-09-13 MED ORDER — AMOXICILLIN-POT CLAVULANATE 875-125 MG PO TABS: 1.0000 | ORAL_TABLET | Freq: Two times a day (BID) | ORAL | 0 refills | Status: DC

## 2022-09-13 NOTE — Progress Notes (Signed)

## 2022-09-22 ENCOUNTER — Telehealth: Payer: Self-pay | Admitting: Physician Assistant

## 2022-09-22 NOTE — Telephone Encounter (Signed)
Pt states she is returning a call from [facility] about FMLA paperwork pt dropped off. Please advise

## 2022-09-23 ENCOUNTER — Ambulatory Visit (INDEPENDENT_AMBULATORY_CARE_PROVIDER_SITE_OTHER): Payer: 59

## 2022-09-23 DIAGNOSIS — I442 Atrioventricular block, complete: Secondary | ICD-10-CM

## 2022-09-29 LAB — CUP PACEART REMOTE DEVICE CHECK
Battery Remaining Longevity: 150 mo
Battery Remaining Percentage: 100 %
Brady Statistic RA Percent Paced: 36 %
Brady Statistic RV Percent Paced: 100 %
Date Time Interrogation Session: 20240202070400
Implantable Lead Connection Status: 753985
Implantable Lead Connection Status: 753985
Implantable Lead Implant Date: 20230731
Implantable Lead Implant Date: 20230731
Implantable Lead Location: 753859
Implantable Lead Location: 753860
Implantable Lead Model: 7841
Implantable Lead Model: 7842
Implantable Lead Serial Number: 1215821
Implantable Lead Serial Number: 1304218
Implantable Pulse Generator Implant Date: 20230731
Lead Channel Impedance Value: 646 Ohm
Lead Channel Impedance Value: 680 Ohm
Lead Channel Setting Pacing Amplitude: 2 V
Lead Channel Setting Pacing Amplitude: 2.5 V
Lead Channel Setting Pacing Pulse Width: 0.6 ms
Lead Channel Setting Sensing Sensitivity: 4 mV
Pulse Gen Serial Number: 600527
Zone Setting Status: 755011

## 2022-10-02 ENCOUNTER — Other Ambulatory Visit (HOSPITAL_COMMUNITY): Payer: Self-pay

## 2022-10-02 MED ORDER — METOPROLOL SUCCINATE ER 25 MG PO TB24: 12.5000 mg | ORAL_TABLET | Freq: Every day | ORAL | 3 refills | Status: DC

## 2022-10-02 MED ORDER — METOPROLOL SUCCINATE ER 25 MG PO TB24
12.5000 mg | ORAL_TABLET | Freq: Every day | ORAL | 3 refills | Status: DC
  Filled 2022-10-02 – 2022-10-03 (×3): qty 45, 90d supply, fill #0

## 2022-10-03 ENCOUNTER — Other Ambulatory Visit (HOSPITAL_COMMUNITY): Payer: Self-pay

## 2022-10-06 ENCOUNTER — Ambulatory Visit: Payer: Commercial Managed Care - PPO | Admitting: Orthopaedic Surgery

## 2022-10-07 ENCOUNTER — Telehealth: Payer: Self-pay | Admitting: Student

## 2022-10-07 DIAGNOSIS — Z0279 Encounter for issue of other medical certificate: Secondary | ICD-10-CM

## 2022-10-07 NOTE — Telephone Encounter (Signed)
Patient came paid for form to be faxed. [id number]

## 2022-10-17 ENCOUNTER — Other Ambulatory Visit (HOSPITAL_COMMUNITY): Payer: Self-pay

## 2022-10-21 NOTE — Progress Notes (Signed)
Remote pacemaker transmission.   

## 2022-10-23 ENCOUNTER — Encounter: Payer: Commercial Managed Care - PPO | Admitting: Physician Assistant

## 2022-10-23 ENCOUNTER — Ambulatory Visit
Admission: EM | Admit: 2022-10-23 | Discharge: 2022-10-23 | Disposition: A | Discharge status: Home / Self Care | Payer: Commercial Managed Care - PPO | Attending: Nurse Practitioner | Admitting: Nurse Practitioner

## 2022-10-23 ENCOUNTER — Ambulatory Visit (INDEPENDENT_AMBULATORY_CARE_PROVIDER_SITE_OTHER): Payer: Commercial Managed Care - PPO

## 2022-10-23 DIAGNOSIS — M79671 Pain in right foot: Secondary | ICD-10-CM

## 2022-10-23 DIAGNOSIS — S93601A Unspecified sprain of right foot, initial encounter: Secondary | ICD-10-CM

## 2022-10-23 DIAGNOSIS — S99921A Unspecified injury of right foot, initial encounter: Secondary | ICD-10-CM | POA: Diagnosis not present

## 2022-10-23 NOTE — ED Triage Notes (Signed)
Patient states she fell wednesday morning and now has right foot pain.   Home interventions: tylenol

## 2022-10-23 NOTE — Discharge Instructions (Signed)
Continue elevation, ice, Tylenol as needed Ace wrap to the foot as needed Follow-up with your PCP if symptoms do not improve Please go to emergency room if you have any worsening symptoms

## 2022-10-23 NOTE — ED Provider Notes (Signed)
[facility]    CSN: [id number] Arrival date & time: 10/23/22  1421      History   Chief Complaint Chief Complaint  Patient presents with   Foot Pain    HPI S[place]ie is a 72 y.o. female presents for evaluation of foot pain.  Patient states yesterday she tripped over a cord and fell.  No head injury or LOC.  Since then she has been having pain all over her right foot.  She is able to bear weight with some pain.  Denies numbness or tingling.  No bruising or swelling.  Reports a history of a torn Achilles tendon to that foot but otherwise no other foot surgeries or fractures.  She is been taking Tylenol OTC.  No other concerns at this time.   Foot Pain    Past Medical History:  Diagnosis Date   Arthritis    Chronic back pain    Diverticulitis    DVT (deep venous thrombosis) [facility]) age 70   s/p  knee arthroscopy    Herpes zoster conjunctivitis    Menopause    Mobitz type 2 second degree AV block    2:1/notes 02/16/2013   PONV (postoperative nausea and vomiting)    Presence of permanent cardiac pacemaker    Seasonal allergies    "spring & fall; not q year" (02/17/2013)   Shoulder impingement    and left rotator cuff tear    Patient Active Problem List   Diagnosis Date Noted   Hypertension 07/10/2022   Acute blood loss anemia 04/01/2022   DVT of left UE  04/01/2022   Complete heart block ([place]) 03/24/2022   Pacemaker complications [id number]   Aortic atherosclerosis ([place]) 03/07/2022   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 ([place]) 04/13/2019   Painful total knee replacement, right ([place]) 04/12/2019   Near syncope 03/11/2013   Bradycardia 02/21/2013   Pacemaker 02/20/2013   Chest tightness 02/19/2013   Palpitations 02/19/2013   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   Diverticulitis 03/03/2012   Chronic low back pain 08/28/2011   GERD (gastroesophageal reflux disease)    Herpes zoster conjunctivitis    Menopause     Arthritis    Diverticulosis    Allergy     Past Surgical History:  Procedure Laterality Date   ACHILLES TENDON SURGERY     ARTHROGRAM KNEE Right 1975   CHOLECYSTECTOMY  1990's   colonscopy     ESOPHAGOGASTRODUODENOSCOPY     EYE SURGERY     muscle release right eye   INSERT / REPLACE / REMOVE PACEMAKER  02/17/2013   [employer] Advantio dual-chamber pacemaker, model [id number],   KNEE ARTHROSCOPY Right 1982, 1983, 1995, 1996   KNEE ARTHROSCOPY W/ ACL RECONSTRUCTION Left 1984   LAPAROSCOPIC SIGMOID COLECTOMY N/A 01/01/2015   Procedure: LAPAROSCOPIC SIGMOID COLECTOMY;  Surgeon: Stark Klein, MD;  Location: [place];  Service: General;  Laterality: N/A;   LEAD EXTRACTION N/A 03/24/2022   Procedure: LEAD EXTRACTION;  Surgeon: Evans Lance, MD;  Location: [place] CV LAB;  Service: Cardiovascular;  Laterality: N/A;   PACEMAKER IMPLANT N/A 03/24/2022   Procedure: PACEMAKER IMPLANT;  Surgeon: Evans Lance, MD;  Location: [place] CV [facility];  Service: Cardiovascular;  Laterality: N/A;   PERMANENT PACEMAKER INSERTION N/A 02/17/2013   Procedure: PERMANENT PACEMAKER INSERTION;  Surgeon: Evans Lance, MD;  Location: [facility] C[facility] LAB;  Service: Cardiovascular;  Laterality: N/A;   SHOULDER ARTHROSCOPY Left 09/05/2016   Procedure: LEFT  SHOULDER ARTHROSCOPY , ACROMIOPLASTY AND ROTATOR CUFF REPAIR;  Surgeon: Melrose Nakayama, MD;  Location: [place];  Service: Orthopedics;  Laterality: Left;   TONSILLECTOMY  1961   TOTAL KNEE ARTHROPLASTY Right 2000; 06/2000   "replaced w/appropriate hardware" (02/17/2013)   TOTAL KNEE REVISION Right 04/13/2019   Procedure: Revision Right Knee Arthroplasty;  Surgeon: Frederik Pear, MD;  Location: [facility];  Service: Orthopedics;  Laterality: Right;   TRIGGER FINGER RELEASE Right ~ 2012   "thumb" (02/17/2013)   VAGINAL HYSTERECTOMY  ~ 1994   partial     OB History     Gravida  1   Para  1   Term      Preterm      AB      Living         SAB      IAB       Ectopic      Multiple      Live Births               Home Medications    Prior to Admission medications   Medication Sig Start Date End Date Taking? Authorizing Provider  acetaminophen (TYLENOL) 325 MG tablet Take 650 mg by mouth every 6 (six) hours as needed for moderate pain or fever.    [provider]  amoxicillin-clavulanate (AUGMENTIN) 875-125 MG tablet Take 1 tablet by mouth 2 (two) times daily. 09/13/22   Hassell Done, Mary-Margaret, FNP  CALCIUM PO Take 1 tablet by mouth daily.    [provider]  Cholecalciferol (VITAMIN D3) 125 MCG (5000 UT) CAPS Take 5,000 Units by mouth daily.    [provider]  Coenzyme Q10 (COQ10) 200 MG CAPS Take by mouth daily at 6 (six) AM.    [provider]  Cyanocobalamin (VITAMIN B-12) 5000 MCG SUBL Place 5,000 mcg under the tongue daily.    [provider]  diclofenac sodium (VOLTAREN) 1 % GEL Apply 1-2 g topically 4 (four) times daily as needed (knee pain.).    [provider]  famotidine (PEPCID) 40 MG tablet Take 40 mg by mouth every morning.    [provider]  fluticasone (FLONASE) 50 MCG/ACT nasal spray Place 1 spray into both nostrils daily as needed for allergies or rhinitis.    [provider]  metoprolol succinate (TOPROL-XL) 25 MG 24 hr tablet Take 0.5 tablets (12.5 mg total) by mouth daily. 10/02/22   Baldwin Jamaica, PA-C  metoprolol succinate (TOPROL-XL) 25 MG 24 hr tablet Take 1/2 tablet (12.5 mg total) by mouth daily. 10/02/22   Baldwin Jamaica, PA-C  Multiple Vitamin (MULTIVITAMIN WITH MINERALS) TABS Take 1 tablet by mouth daily.    [provider]  Multiple Vitamins-Minerals (AIRBORNE GUMMIES PO) Take 2 each by mouth daily.    [provider]  Omega-3 Fatty Acids (FISH OIL ULTRA) 1400 MG CAPS Take 1,400 mg by mouth daily.    [provider]  Probiotic Product (PROBIOTIC FORMULA PO) Take 1 capsule by mouth daily.    [provider]  rivaroxaban (XARELTO) 20 MG TABS tablet Take 1 tablet (20 mg total) by mouth daily with supper. 04/24/22   Evans Lance, MD  rosuvastatin (CRESTOR) 5 MG tablet Take 1 tablet (5 mg total) by mouth daily. 07/01/22   Shelda Pal, DO  valACYclovir (VALTREX) 1000 MG tablet Take 1 tablet (1,000 mg total) by mouth daily as needed. 08/05/22   Shelda Pal, DO    Family  History Family History  Problem Relation Age of Onset   Hypertension Mother    Hyperlipidemia Mother    Macular degeneration Mother    COPD Mother    Lung cancer Father    Cancer Father        Lung   COPD Father    Diabetes Brother    Breast cancer Paternal Aunt     Social History Social History   Tobacco Use   Smoking status: Never   Smokeless tobacco: Never  Vaping Use   Vaping Use: Never used  Substance Use Topics   Alcohol use: Yes    Comment: occasional wine    Drug use: No     Allergies   Eliquis [apixaban], Bactrim [sulfamethoxazole-trimethoprim], Ceftin, Vancomycin, and Levofloxacin   Review of Systems Review of Systems  Musculoskeletal:        Right foot pain     Physical Exam Triage Vital Signs ED Triage Vitals  Enc Vitals Group     BP 10/23/22 1449 (!) 144/87     Pulse Rate 10/23/22 1449 71     Resp 10/23/22 1449 18     Temp 10/23/22 1449 97.9 F (36.6 C)     Temp Source 10/23/22 1449 Oral     SpO2 10/23/22 1449 95 %     Weight --      Height --      Head Circumference --      Peak Flow --      Pain Score 10/23/22 1447 4     Pain Loc --      Pain Edu? --      Excl. in [place]? --    No data found.  Updated Vital Signs BP (!) 144/87 (BP Location: Right Arm)   Pulse 71   Temp 97.9 F (36.6 C) (Oral)   Resp 18   SpO2 95%   Visual Acuity Right Eye Distance:   Left Eye Distance:   Bilateral Distance:    Right Eye Near:   Left Eye Near:    Bilateral Near:     Physical Exam Vitals and nursing note reviewed.  Constitutional:      Appearance:  Normal appearance.  HENT:     Head: Normocephalic and atraumatic.  Eyes:     Pupils: Pupils are equal, round, and reactive to light.  Cardiovascular:     Rate and Rhythm: Normal rate.  Pulmonary:     Effort: Pulmonary effort is normal.  Musculoskeletal:       Feet:  Feet:     Comments: No swelling or ecchymosis of the right foot.  Tender to palpation to the entire dorsum of the right foot.  No tenderness to palpation to the ankle.  Full range of motion of foot with pain on active dorsi extension.  DP +2 Skin:    General: Skin is warm and dry.  Neurological:     General: No focal deficit present.     Mental Status: She is alert and oriented to person, place, and time.  Psychiatric:        Mood and Affect: Mood normal.        Behavior: Behavior normal.      UC Treatments / Results  Labs (all labs ordered are listed, but only abnormal results are displayed) Labs Reviewed - No data to display  EKG   Radiology DG Foot Complete Right  Result Date: 10/23/2022 CLINICAL DATA:  Fall a with dorsal foot pain. Right foot injury. EXAM: RIGHT  FOOT COMPLETE - 3+ VIEW COMPARISON:  None Available. FINDINGS: There is no evidence of fracture or dislocation. Minor degenerative change of the first metatarsal phalangeal joint. No erosion or periostitis. Soft tissues are unremarkable. IMPRESSION: No fracture or subluxation of the right foot. Electronically Signed   By: Keith Rake M.D.   On: 10/23/2022 15:12    Procedures Procedures (including critical care time)  Medications Ordered in UC Medications - No data to display  Initial Impression / Assessment and Plan / UC Course  I have reviewed the triage vital signs and the nursing notes.  Pertinent labs & imaging results that were available during my care of the patient were reviewed by me and considered in my medical decision making (see chart for details).     X-ray negative for fracture.  Discussed foot sprain Patient states he  has Ace wrap at home and will use RICE therapy and continue OTC Tylenol as needed PCP follow-up if symptoms do not improve ER precautions reviewed and patient verbalized understanding Final Clinical Impressions(s) / UC Diagnoses   Final diagnoses:  Right foot pain  Sprain of right foot, initial encounter     Discharge Instructions      Continue elevation, ice, Tylenol as needed Ace wrap to the foot as needed Follow-up with your PCP if symptoms do not improve Please go to emergency room if you have any worsening symptoms     ED Prescriptions   None    PDMP not reviewed this encounter.   Melynda Ripple, NP 10/23/22 [phone]

## 2022-10-24 ENCOUNTER — Telehealth: Payer: Self-pay

## 2022-10-24 NOTE — Telephone Encounter (Signed)
Completed [employer] form scanned to patient's documents and sent to [employer].  Originals were left at front desk for patient to pick up.

## 2022-10-24 NOTE — Telephone Encounter (Signed)
Pt called and made aware of Dr. Lovena Le signing her FMLA paperwork, and that it is now complete.   FMLA paperwork will be faxed, and scanned into the chart.   Pt requested a copy of the FMLA paperwork be mailed to her house.

## 2022-10-28 NOTE — Progress Notes (Unsigned)
  Cardiology Office Note:   Date:  10/29/2022  ID:  Kari [name], DOB 1950/10/08, MRN [id number]  Primary Cardiologist: Cristopher Peru, MD Electrophysiologist: Cristopher Peru, MD   History of Present Illness:   Kari [name] is a 72 y.o. female with DVT on [place], CHB s/p PPM, h/o lead failure and system extraction / new implant 03/24/2022.   She went to the [place] ER 09/07/22 with reports of feeling shaky/tremulous she was found hypertensive, 173/95, HR 72, afebrile, w/u in the ER done including CT head with no concerning findings, including pacer check apparently and discharged from the ER   A second ER visit 09/08/22, the same symptoms. BP 138/71, HR 65 Noted she had started a new drink/supplenet otherwise her usual routine Pacemaker reportedly checked without concerning findings perhaps an AT and started on BB, otherwise again, reassuring findings  Seen by [facility]16 for same. Device checked in depth with normal function and no evidence of arhythmia. Pt did NOT have cardiac awareness during device checks.   Pt wore monitor that did not show any sustained arrhythmias. 1 solitary episode of PMT.  seen today for routine electrophysiology followup. Since last being seen in our clinic the patient reports doing very well. She has had no further "episodes". She does still have some fatigue with a activity despite appropriate histograms.   Review of systems complete and found to be negative unless listed in HPI.   DEVICE HISTORY:  Engineer, agricultural PPM implanted 02/17/2013 for Complete Heart Block Extraction and re-implant 8/1 with BSx DDD PPM  Studies Reviewed:    EKG is not ordered today  Risk Assessment/Calculations:        Physical Exam:   VS:  BP 112/76   Pulse 72   Ht '5\' 8"'$  (1.727 m)   SpO2 98%   BMI 23.26 kg/m    Wt Readings from Last 3 Encounters:  09/09/22 153 lb (69.4 kg)  09/08/22 143 lb 4.8 oz (65 kg)  09/07/22 143 lb 1.3 oz (64.9 kg)     GEN:  Well nourished, well developed in no acute distress NECK: No JVD; No carotid bruits CARDIAC: Regular rate and rhythm, no murmurs, rubs, gallops RESPIRATORY:  Clear to auscultation without rales, wheezing or rhonchi  ABDOMEN: Soft, non-tender, non-distended EXTREMITIES:  No edema; No deformity   ASSESSMENT AND PLAN:    CHB s/p extraction and reimplantation of device due to lead malfunction Pacemaker in situ In depth interrogation 09/09/22 reviewed and stable. Not checked today.  She has had some fatigue with exertion but has appropriate histograms without demonstration of chronotropic incompetence.    Palpitations Weak tremulous spells So far, cardiac work up has been unrevealing.  She clarifies that these were two discrete but solitary episodes.   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  Can revisit if she has clinically significant fib/flutter in the future.   H/o provoked DVT of left UE Off Xarelto now 6 months s/p.    Follow up with Dr. Lovena Le in 12 months, sooner with issues.   Signed, Shirley Friar, PA-C atr

## 2022-10-29 ENCOUNTER — Encounter: Payer: Self-pay | Admitting: Student

## 2022-10-29 ENCOUNTER — Ambulatory Visit: Payer: Commercial Managed Care - PPO | Attending: Physician Assistant | Admitting: Student

## 2022-10-29 VITALS — BP 112/76 | HR 72 | Ht 68.0 in

## 2022-10-29 DIAGNOSIS — R002 Palpitations: Secondary | ICD-10-CM | POA: Diagnosis not present

## 2022-10-29 DIAGNOSIS — I442 Atrioventricular block, complete: Secondary | ICD-10-CM | POA: Diagnosis not present

## 2022-10-29 DIAGNOSIS — Z950 Presence of cardiac pacemaker: Secondary | ICD-10-CM

## 2022-10-29 DIAGNOSIS — I10 Essential (primary) hypertension: Secondary | ICD-10-CM

## 2022-10-29 NOTE — Patient Instructions (Signed)
Medication Instructions:  Your physician recommends that you continue on your current medications as directed. Please refer to the Current Medication list given to you today.  *If you need a refill on your cardiac medications before your next appointment, please call your pharmacy*   Lab Work: None If you have labs (blood work) drawn today and your tests are completely normal, you will receive your results only by: [place] (if you have MyChart) OR A paper copy in the mail If you have any lab test that is abnormal or we need to change your treatment, we will call you to review the results.   Follow-Up: At [facility], you and your health needs are our priority.  As part of our continuing mission to provide you with exceptional heart care, we have created designated Provider Care Teams.  These Care Teams include your primary Cardiologist (physician) and Advanced Practice Providers (APPs -  Physician Assistants and Nurse Practitioners) who all work together to provide you with the care you need, when you need it.  Your next appointment:   1 year(s)  Provider:   Cristopher Peru, MD

## 2022-10-31 ENCOUNTER — Encounter: Payer: Self-pay | Admitting: Family Medicine

## 2022-10-31 ENCOUNTER — Ambulatory Visit: Payer: Commercial Managed Care - PPO | Admitting: Family Medicine

## 2022-10-31 ENCOUNTER — Telehealth: Payer: Self-pay | Admitting: Family Medicine

## 2022-10-31 ENCOUNTER — Other Ambulatory Visit (HOSPITAL_COMMUNITY): Payer: Self-pay

## 2022-10-31 VITALS — BP 121/80 | HR 63 | Temp 97.5°F

## 2022-10-31 DIAGNOSIS — R5383 Other fatigue: Secondary | ICD-10-CM | POA: Diagnosis not present

## 2022-10-31 LAB — TSH: TSH: 4.77 u[IU]/mL (ref 0.35–5.50)

## 2022-10-31 LAB — T4, FREE: Free T4: 0.78 ng/dL (ref 0.60–1.60)

## 2022-10-31 MED ORDER — NITROFURANTOIN MONOHYD MACRO 100 MG PO CAPS
100.0000 mg | ORAL_CAPSULE | Freq: Two times a day (BID) | ORAL | 0 refills | Status: AC
  Filled 2022-10-31: qty 20, 10d supply, fill #0

## 2022-10-31 NOTE — Telephone Encounter (Signed)
Called left detailed message of PCP response

## 2022-10-31 NOTE — Telephone Encounter (Signed)
Called left message of PCP response.

## 2022-10-31 NOTE — Telephone Encounter (Signed)
Just sent the [place] for now. Waiting on her results before sending anything else.

## 2022-10-31 NOTE — Patient Instructions (Addendum)
Give [place] 2-3 business days to get the results of your labs back.   Keep the diet clean and stay active.  Try to drink 55-60 oz of water daily outside of exercise.  Let us know if you need anything.

## 2022-10-31 NOTE — Telephone Encounter (Signed)
Patient would like to know if she was supposed to start a new medication today, she stated she received one medication but thought it was going to be two. Please advise.

## 2022-10-31 NOTE — Progress Notes (Signed)
Chief Complaint  Patient presents with   TSH symptoms    Subjective: Patient is a 72 y.o. female here for fatigue.  Over the past few mo, has been having worsening fatigue, cold intolerance, slower BM's, and wt gain (w/o dietary/exercise changes). TSH has been trending up over past few years. Diet healthy, still exercising. 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   Past Medical History:  Diagnosis Date   Arthritis    Chronic back pain    Diverticulitis    DVT (deep venous thrombosis) [facility]) age 48   s/p  knee arthroscopy    Herpes zoster conjunctivitis    Menopause    Mobitz type 2 second degree AV block    2:1/notes 02/16/2013   PONV (postoperative nausea and vomiting)    Presence of permanent cardiac pacemaker    Seasonal allergies    "spring & fall; not q year" (02/17/2013)   Shoulder impingement    and left rotator cuff tear    Objective: BP 121/80 (BP Location: Left Arm, Patient Position: Sitting, Cuff Size: Normal)   Pulse 63   Temp (!) 97.5 F (36.4 C) (Oral)   SpO2 97%  General: Awake, appears stated age Heart: RRR, no LE edema Lungs: CTAB, no rales, wheezes or rhonchi. No accessory muscle use Eyes: No exophthalmos Mouth: Mucous membranes are dry Skin: No scaling or dimpling of the skin on exposed surfaces Neck: Supple, symmetric, no appreciable thyromegaly or masses Psych: Age appropriate judgment and insight, normal affect and mood  Assessment and Plan: Fatigue, unspecified type - Plan: TSH, T4, free, VITAMIN D 25 Hydroxy (Vit-D Deficiency, Fractures)  Check TSH and free T4 along with vitamin D.  Stay hydrated.  Try to get adequate amounts of sleep.  Based off of the lab results, could consider a low-dose levothyroxine if the T4 is on the lower end of normal.  This to be under the observation that her thyroid level has been steadily decreasing given the increase in TSH. The patient voiced understanding and agreement to the plan.  I spent 30 minutes with  the patient discussing the above plan in addition to reviewing her chart on the same day of the visit.  [place], DO 10/31/22  11:18 AM

## 2022-11-01 LAB — VITAMIN D 25 HYDROXY (VIT D DEFICIENCY, FRACTURES): VITD: 31.38 ng/mL (ref 30.00–100.00)

## 2022-11-02 ENCOUNTER — Other Ambulatory Visit: Payer: Self-pay | Admitting: Family Medicine

## 2022-11-02 ENCOUNTER — Encounter: Payer: Self-pay | Admitting: Family Medicine

## 2022-11-02 MED ORDER — LEVOTHYROXINE SODIUM 25 MCG PO TABS
25.0000 ug | ORAL_TABLET | Freq: Every day | ORAL | 3 refills | Status: DC
  Filled 2022-11-02: qty 30, 30d supply, fill #0
  Filled 2022-11-28: qty 30, 30d supply, fill #1
  Filled 2022-12-30: qty 30, 30d supply, fill #2
  Filled 2023-01-30: qty 30, 30d supply, fill #3

## 2022-11-03 ENCOUNTER — Other Ambulatory Visit: Payer: Self-pay

## 2022-11-03 ENCOUNTER — Other Ambulatory Visit: Payer: Self-pay | Admitting: Family Medicine

## 2022-11-03 DIAGNOSIS — R5383 Other fatigue: Secondary | ICD-10-CM

## 2022-11-11 ENCOUNTER — Emergency Department (HOSPITAL_BASED_OUTPATIENT_CLINIC_OR_DEPARTMENT_OTHER)
Admission: EM | Admit: 2022-11-11 | Discharge: 2022-11-11 | Disposition: A | Discharge status: Home / Self Care | Payer: Commercial Managed Care - PPO | Attending: Emergency Medicine | Admitting: Emergency Medicine

## 2022-11-11 ENCOUNTER — Other Ambulatory Visit (HOSPITAL_BASED_OUTPATIENT_CLINIC_OR_DEPARTMENT_OTHER): Payer: Self-pay

## 2022-11-11 ENCOUNTER — Emergency Department (HOSPITAL_BASED_OUTPATIENT_CLINIC_OR_DEPARTMENT_OTHER): Payer: Commercial Managed Care - PPO

## 2022-11-11 ENCOUNTER — Encounter (HOSPITAL_BASED_OUTPATIENT_CLINIC_OR_DEPARTMENT_OTHER): Payer: Self-pay

## 2022-11-11 DIAGNOSIS — M7989 Other specified soft tissue disorders: Secondary | ICD-10-CM | POA: Diagnosis not present

## 2022-11-11 DIAGNOSIS — I82622 Acute embolism and thrombosis of deep veins of left upper extremity: Secondary | ICD-10-CM | POA: Insufficient documentation

## 2022-11-11 DIAGNOSIS — I82402 Acute embolism and thrombosis of unspecified deep veins of left lower extremity: Secondary | ICD-10-CM | POA: Diagnosis not present

## 2022-11-11 DIAGNOSIS — R2232 Localized swelling, mass and lump, left upper limb: Secondary | ICD-10-CM | POA: Diagnosis present

## 2022-11-11 MED ORDER — RIVAROXABAN (XARELTO) VTE STARTER PACK (15 & 20 MG)
ORAL_TABLET | ORAL | 0 refills | Status: DC
  Filled 2022-11-11: qty 51, 30d supply, fill #0

## 2022-11-11 NOTE — ED Provider Notes (Signed)
[place] EMERGENCY DEPARTMENT AT [facility] [place] Provider Note   CSN: [id number] Arrival date & time: 11/11/22  0710     History Chief Complaint  Patient presents with   Arm Swelling    HPI Kari [name] is a pleasant 72 y.o. female presenting for left upper extremity swelling.  States that she has a history of DVT in this extremity.  States she was just taken off Xarelto recently preprocedurally and started having pain and swelling in her left arm again.  Denies fevers chills nausea vomiting syncope shortness of breath any other symptoms at this time.  Denies any other localizing signs or symptoms.   Patient's recorded medical, surgical, social, medication list and allergies were reviewed in the Snapshot window as part of the initial history.   Review of Systems   Review of Systems  Constitutional:  Negative for chills and fever.  HENT:  Negative for ear pain and sore throat.   Eyes:  Negative for pain and visual disturbance.  Respiratory:  Negative for cough and shortness of breath.   Cardiovascular:  Negative for chest pain and palpitations.  Gastrointestinal:  Negative for abdominal pain and vomiting.  Genitourinary:  Negative for dysuria and hematuria.  Musculoskeletal:  Negative for arthralgias and back pain.  Skin:  Negative for color change and rash.  Neurological:  Negative for seizures and syncope.  All other systems reviewed and are negative.   Physical Exam Updated Vital Signs BP (!) 152/86   Pulse 67   Temp 97.6 F (36.4 C)   Resp 18   Ht 5\' 8"  (1.727 m)   Wt 64.9 kg   SpO2 98%   BMI 21.74 kg/m  Physical Exam Vitals and nursing note reviewed.  Constitutional:      General: She is not in acute distress.    Appearance: She is well-developed.  HENT:     Head: Normocephalic and atraumatic.  Eyes:     Conjunctiva/sclera: Conjunctivae normal.  Cardiovascular:     Rate and Rhythm: Normal rate and regular rhythm.     Heart sounds: No  murmur heard. Pulmonary:     Effort: Pulmonary effort is normal. No respiratory distress.     Breath sounds: Normal breath sounds.  Abdominal:     General: There is no distension.     Palpations: Abdomen is soft.     Tenderness: There is no abdominal tenderness. There is no right CVA tenderness or left CVA tenderness.  Musculoskeletal:        General: No swelling or tenderness. Normal range of motion.     Cervical back: Neck supple.  Skin:    General: Skin is warm and dry.  Neurological:     General: No focal deficit present.     Mental Status: She is alert and oriented to person, place, and time. Mental status is at baseline.     Cranial Nerves: No cranial nerve deficit.      ED Course/ Medical Decision Making/ A&P    Procedures Procedures   Medications Ordered in ED Medications - No data to display  Medical Decision Making:    Kari [name] is a 72 y.o. female who presented to the ED today with LUE swelling detailed above.     Initial Assessment:   Left upper extremity swelling and pain.  Possible musculoskeletal etiology.  Given history of DVT and current discontinuation of anticoagulation will evaluate for upper extremity DVT. Results showed interval continuation of symptoms and left upper extremity  DVT.  She is symptomatic with her left arm pain and worsening swelling overnight.  I would feel more comfortable with the patient being anticoagulated until followed up by primary care.  She likely needs testing for hypercoagulability though this is likely caused by vascular disruption from her procedure at the site.  Given symptomatic worsening, disrupted flow identified on ultrasound, would recommend outpatient follow-up with ongoing care management.  Disposition:  Considering all of the above, I believe this patient is stable for discharge at this time.  Emergency Department Medication Summary:   Medications - No data to display      Clinical Impression:  1.  Acute deep vein thrombosis (DVT) of other vein of left upper extremity [facility])      Discharge   Final Clinical Impression(s) / ED Diagnoses Final diagnoses:  Acute deep vein thrombosis (DVT) of other vein of left upper extremity ([place])    Rx / DC Orders ED Discharge Orders          Ordered    RIVAROXABAN (XARELTO) VTE STARTER PACK (15 & 20 MG)        11/11/22 [id number]              Tretha Sciara, MD 11/11/22 [phone]

## 2022-11-11 NOTE — ED Triage Notes (Signed)
Pt reports left arm swelling. Off xarelto 2 weeks with hx of clots in left arm

## 2022-11-11 NOTE — ED Notes (Signed)
Reviewed discharge instructions with pt. Pt states understanding

## 2022-12-04 DIAGNOSIS — K5730 Diverticulosis of large intestine without perforation or abscess without bleeding: Secondary | ICD-10-CM | POA: Diagnosis not present

## 2022-12-04 DIAGNOSIS — Z1211 Encounter for screening for malignant neoplasm of colon: Secondary | ICD-10-CM | POA: Diagnosis not present

## 2022-12-04 DIAGNOSIS — K219 Gastro-esophageal reflux disease without esophagitis: Secondary | ICD-10-CM | POA: Diagnosis not present

## 2022-12-04 DIAGNOSIS — R1032 Left lower quadrant pain: Secondary | ICD-10-CM | POA: Diagnosis not present

## 2022-12-05 ENCOUNTER — Encounter: Payer: Self-pay | Admitting: Family Medicine

## 2022-12-08 ENCOUNTER — Other Ambulatory Visit (INDEPENDENT_AMBULATORY_CARE_PROVIDER_SITE_OTHER): Payer: Commercial Managed Care - PPO

## 2022-12-08 DIAGNOSIS — R5383 Other fatigue: Secondary | ICD-10-CM | POA: Diagnosis not present

## 2022-12-08 LAB — TSH: TSH: 1.95 u[IU]/mL (ref 0.35–5.50)

## 2022-12-09 LAB — T4: T4, Total: 9.9 ug/dL (ref 5.1–11.9)

## 2022-12-10 ENCOUNTER — Encounter (HOSPITAL_BASED_OUTPATIENT_CLINIC_OR_DEPARTMENT_OTHER): Payer: Self-pay | Admitting: Emergency Medicine

## 2022-12-10 ENCOUNTER — Emergency Department (HOSPITAL_BASED_OUTPATIENT_CLINIC_OR_DEPARTMENT_OTHER)
Admission: EM | Admit: 2022-12-10 | Discharge: 2022-12-10 | Disposition: A | Discharge status: Home / Self Care | Payer: Commercial Managed Care - PPO | Attending: Emergency Medicine | Admitting: Emergency Medicine

## 2022-12-10 ENCOUNTER — Emergency Department (HOSPITAL_BASED_OUTPATIENT_CLINIC_OR_DEPARTMENT_OTHER): Payer: Commercial Managed Care - PPO

## 2022-12-10 DIAGNOSIS — Z043 Encounter for examination and observation following other accident: Secondary | ICD-10-CM | POA: Diagnosis not present

## 2022-12-10 DIAGNOSIS — S59902A Unspecified injury of left elbow, initial encounter: Secondary | ICD-10-CM | POA: Diagnosis present

## 2022-12-10 DIAGNOSIS — W01198A Fall on same level from slipping, tripping and stumbling with subsequent striking against other object, initial encounter: Secondary | ICD-10-CM | POA: Insufficient documentation

## 2022-12-10 DIAGNOSIS — Z7901 Long term (current) use of anticoagulants: Secondary | ICD-10-CM | POA: Insufficient documentation

## 2022-12-10 DIAGNOSIS — Y93H2 Activity, gardening and landscaping: Secondary | ICD-10-CM | POA: Diagnosis not present

## 2022-12-10 DIAGNOSIS — M7022 Olecranon bursitis, left elbow: Secondary | ICD-10-CM | POA: Diagnosis not present

## 2022-12-10 DIAGNOSIS — W19XXXA Unspecified fall, initial encounter: Secondary | ICD-10-CM

## 2022-12-10 MED ORDER — OXYCODONE HCL 5 MG PO TABS: 2.5000 mg | ORAL_TABLET | ORAL | 0 refills | Status: DC | PRN

## 2022-12-10 NOTE — ED Triage Notes (Signed)
Left elbow pain after tripping over rock feature at home about 03:00 pm. Pt takes xarelto.

## 2022-12-10 NOTE — Discharge Instructions (Signed)
You are seen for elbow bursitis.  Likely traumatic in nature. Please follow-up with your primary care provider, orthopedic as needed. Immobilize your elbow for pain.  I have sent a low-dose narcotic to help with pain control for the next few days.  Titrate to Tylenol primarily over the next 2 to 3 days. Participate in the exercises we discussed.

## 2022-12-10 NOTE — ED Provider Notes (Signed)
[place] [facility] [facility] Provider Note   CSN: [id number] Arrival date & time: 12/10/22  2211     History Chief Complaint  Patient presents with   Elbow Injury    HPI Kari [name] is a 72 y.o. female presenting for left upper extremity pain.  States that she was working in her garden today when she lost her balance fell hit her left arm.  She was recently started on Xarelto for a DVT she denies fevers chills nausea vomiting syncope shortness of breath.  Did not hit her head did not hit of her neck.  States that she has no other injuries but is struggling to extend her left elbow.   Patient's recorded medical, surgical, social, medication list and allergies were reviewed in the Snapshot window as part of the initial history.   Review of Systems   Review of Systems  Constitutional:  Negative for chills and fever.  HENT:  Negative for ear pain and sore throat.   Eyes:  Negative for pain and visual disturbance.  Respiratory:  Negative for cough and shortness of breath.   Cardiovascular:  Negative for chest pain and palpitations.  Gastrointestinal:  Negative for abdominal pain and vomiting.  Genitourinary:  Negative for dysuria and hematuria.  Musculoskeletal:  Negative for arthralgias and back pain.  Skin:  Negative for color change and rash.  Neurological:  Negative for seizures and syncope.  All other systems reviewed and are negative.   Physical Exam Updated Vital Signs BP (!) 146/87 (BP Location: Right Arm)   Pulse 68   Temp 98.1 F (36.7 C) (Oral)   Resp 20   Ht  (1.727 m)   Wt 64 kg   SpO2 96%   BMI 21.45 kg/m  Physical Exam Vitals and nursing note reviewed.  Constitutional:      General: She is not in acute distress.    Appearance: She is well-developed.  HENT:     Head: Normocephalic and atraumatic.  Eyes:     Conjunctiva/sclera: Conjunctivae normal.  Cardiovascular:     Rate and Rhythm: Normal rate and regular  rhythm.     Heart sounds: No murmur heard. Pulmonary:     Effort: Pulmonary effort is normal. No respiratory distress.     Breath sounds: Normal breath sounds.  Abdominal:     General: There is no distension.     Palpations: Abdomen is soft.     Tenderness: There is no abdominal tenderness. There is no right CVA tenderness or left CVA tenderness.  Musculoskeletal:        General: Tenderness (Obvious deformity to left elbow.  Floridly swollen olecranon bursitis) and deformity present. No swelling. Normal range of motion.     Cervical back: Neck supple.  Skin:    General: Skin is warm and dry.  Neurological:     General: No focal deficit present.     Mental Status: She is alert and oriented to person, place, and time. Mental status is at baseline.     Cranial Nerves: No cranial nerve deficit.      ED Course/ Medical Decision Making/ A&P    Procedures Procedures   Medications Ordered in ED Medications - No data to display  Medical Decision Making:    Kari [name] is a 72 y.o. female who presented to the ED today with mechanical fall detailed above.    Patient's history of present onset physical exam findings not reveal any acute pathology.  She is ambulatory tolerating p.o. intake in no acute distress. She has a large left-sided bursitis.  We discussed drainage for symptomatic control however recent Xarelto initiation changes the risk-benefit evaluation.  More likely to pose harm than benefit with needle aspiration.  Will x-ray for underlying fracture. Radiology images reviewed and no acute fracture identified.  Will treat for bursitis with sling support, pain control in the outpatient setting.  Disposition:  I have considered need for hospitalization, however, considering all of the above, I believe this patient is stable for discharge at this time.  Patient/family educated about specific return precautions for given chief complaint and symptoms.  Patient/family educated  about follow-up with PCP and orthopedics.     Patient/family expressed understanding of return precautions and need for follow-up. Patient spoken to regarding all imaging and laboratory results and appropriate follow up for these results. All education provided in verbal form with additional information in written form. Time was allowed for answering of patient questions. Patient discharged.    Emergency Department Medication Summary:   Medications - No data to display      Clinical Impression:  1. Fall, initial encounter   2. Olecranon bursitis of left elbow      Discharge   Final Clinical Impression(s) / ED Diagnoses Final diagnoses:  Fall, initial encounter  Olecranon bursitis of left elbow    Rx / DC Orders ED Discharge Orders          Ordered    oxyCODONE (ROXICODONE) 5 MG immediate release tablet  Every 4 hours PRN        12/10/22 2306              Glyn Ade, MD 12/10/22 2307

## 2022-12-11 ENCOUNTER — Other Ambulatory Visit (HOSPITAL_COMMUNITY): Payer: Self-pay

## 2022-12-11 ENCOUNTER — Other Ambulatory Visit: Payer: Self-pay | Admitting: Family Medicine

## 2022-12-11 MED ORDER — ROSUVASTATIN CALCIUM 5 MG PO TABS
5.0000 mg | ORAL_TABLET | Freq: Every day | ORAL | 3 refills | Status: DC
  Filled 2022-12-11: qty 30, 30d supply, fill #0
  Filled 2023-01-08: qty 30, 30d supply, fill #1
  Filled 2023-02-06: qty 30, 30d supply, fill #2
  Filled 2023-03-11: qty 30, 30d supply, fill #3

## 2022-12-18 ENCOUNTER — Telehealth: Payer: Self-pay | Admitting: Internal Medicine

## 2022-12-18 ENCOUNTER — Other Ambulatory Visit: Payer: Self-pay

## 2022-12-18 ENCOUNTER — Other Ambulatory Visit (HOSPITAL_COMMUNITY): Payer: Self-pay

## 2022-12-18 ENCOUNTER — Other Ambulatory Visit: Payer: Self-pay | Admitting: Family Medicine

## 2022-12-18 ENCOUNTER — Other Ambulatory Visit: Payer: Self-pay | Admitting: *Deleted

## 2022-12-18 DIAGNOSIS — I82622 Acute embolism and thrombosis of deep veins of left upper extremity: Secondary | ICD-10-CM

## 2022-12-18 MED ORDER — RIVAROXABAN 20 MG PO TABS
20.0000 mg | ORAL_TABLET | Freq: Every day | ORAL | 3 refills | Status: DC
  Filled 2022-12-18: qty 90, 90d supply, fill #0
  Filled 2023-03-15: qty 90, 90d supply, fill #1

## 2022-12-18 NOTE — Telephone Encounter (Signed)
Xarelto  refill request received. Pt is 72 years old, weight-64kg, Crea-0.71 on 09/07/22, last seen by [name] on 10/29/22, Diagnosis-DVT x2 (taken off & recently restarted), CrCl-74.49 mL/min; Dose is appropriate based on dosing criteria. Will send in refill to requested pharmacy.     Per 09/04/22 message to pt:  [name], MD  to [facility] "[name]"    12/07/22  9:42 AM Kari [name], I did not see the ER information. Not sure what happened. I would recommend long term xarelto, 20 mg daily. If you would like me to arrange for you to see another electrical heart doctor, we have 3 who will be around for the foreseeable future. Dr. Elberta Fortis, Lalla Brothers, and Mealor. Or you can transfer to [facility] or [facility] but they are of course out of town. Hope that helps. GT

## 2022-12-18 NOTE — Telephone Encounter (Signed)
Error

## 2022-12-18 NOTE — Telephone Encounter (Signed)
Pt called back per message received from refill team.    Pt stated about to run out of Xarelto.  Pt was diagnosed with DVT LUE, was seen in ER 11/11/22.  Per Dr. Ladona Ridgel, ok to refill order for Xarelto 20 mg, PO, once daily.    Script sent to [facility] pharmacy per Pt request.  Pt appreciated call, and refill prior to day ending.

## 2022-12-18 NOTE — Telephone Encounter (Signed)
*  STAT* If patient is at the pharmacy, call can be transferred to refill team.   1. Which medications need to be refilled? (please list name of each medication and dose if known)   RIVAROXABAN (XARELTO) VTE STARTER PACK (15 & 20 MG)  2. Which pharmacy/location (including street and city if local pharmacy) is medication to be sent to?  [place] [facility]   3. Do they need a 30 day or 90 day supply?     Patient stated she has 7 tablets left and will be going out of town.

## 2022-12-19 ENCOUNTER — Other Ambulatory Visit (HOSPITAL_COMMUNITY): Payer: Self-pay

## 2022-12-23 ENCOUNTER — Ambulatory Visit (INDEPENDENT_AMBULATORY_CARE_PROVIDER_SITE_OTHER): Payer: Commercial Managed Care - PPO

## 2022-12-23 DIAGNOSIS — I442 Atrioventricular block, complete: Secondary | ICD-10-CM | POA: Diagnosis not present

## 2022-12-28 LAB — CUP PACEART REMOTE DEVICE CHECK
Battery Remaining Longevity: 144 mo
Battery Remaining Percentage: 100 %
Brady Statistic RA Percent Paced: 38 %
Brady Statistic RV Percent Paced: 100 %
Date Time Interrogation Session: 20240503212700
Implantable Lead Connection Status: 753985
Implantable Lead Connection Status: 753985
Implantable Lead Implant Date: 20230731
Implantable Lead Implant Date: 20230731
Implantable Lead Location: 753859
Implantable Lead Location: 753860
Implantable Lead Model: 7841
Implantable Lead Model: 7842
Implantable Lead Serial Number: 1215821
Implantable Lead Serial Number: 1304218
Implantable Pulse Generator Implant Date: 20230731
Lead Channel Impedance Value: 626 Ohm
Lead Channel Impedance Value: 628 Ohm
Lead Channel Setting Pacing Amplitude: 2 V
Lead Channel Setting Pacing Amplitude: 2.5 V
Lead Channel Setting Pacing Pulse Width: 0.6 ms
Lead Channel Setting Sensing Sensitivity: 4 mV
Pulse Gen Serial Number: 600527
Zone Setting Status: 755011

## 2022-12-30 ENCOUNTER — Other Ambulatory Visit: Payer: Self-pay | Admitting: Physician Assistant

## 2022-12-30 ENCOUNTER — Other Ambulatory Visit (HOSPITAL_COMMUNITY): Payer: Self-pay

## 2022-12-31 ENCOUNTER — Other Ambulatory Visit (HOSPITAL_COMMUNITY): Payer: Self-pay

## 2022-12-31 ENCOUNTER — Other Ambulatory Visit: Payer: Self-pay

## 2022-12-31 MED ORDER — METOPROLOL SUCCINATE ER 25 MG PO TB24
12.5000 mg | ORAL_TABLET | Freq: Every day | ORAL | 3 refills | Status: DC
  Filled 2022-12-31: qty 45, 90d supply, fill #0
  Filled 2023-03-25: qty 45, 90d supply, fill #1
  Filled 2023-06-27: qty 45, 90d supply, fill #2
  Filled 2023-10-09: qty 45, 90d supply, fill #3

## 2022-12-31 NOTE — Telephone Encounter (Signed)
Spoke with [facility].  Prescription for [facility] was canceled at March office visit as it was a duplicate. Prescription for [facility] was kept as active prescription.  Will send new prescription to [facility] pharmacy

## 2023-01-05 ENCOUNTER — Other Ambulatory Visit (HOSPITAL_COMMUNITY): Payer: Self-pay

## 2023-01-08 ENCOUNTER — Telehealth: Payer: Self-pay | Admitting: *Deleted

## 2023-01-08 NOTE — Telephone Encounter (Signed)
   Pre-operative Risk Assessment    Patient Name: Kari [name]  DOB: 06-17-1951 MRN: [id number]      Request for Surgical Clearance    Procedure:   COLONOSCOPY  Date of Surgery:  Clearance 03/27/23                                 Surgeon:  DR. MANN Surgeon's Group or Practice Name:  [facility] Phone number:  [phone] Fax number:  [phone]   Type of Clearance Requested:   - Medical  - Pharmacy:  Hold Rivaroxaban (Xarelto)     Type of Anesthesia:   PROPOFOL   Additional requests/questions:    Elpidio Anis   01/08/2023, 6:03 PM

## 2023-01-12 ENCOUNTER — Telehealth: Payer: Self-pay

## 2023-01-12 NOTE — Telephone Encounter (Signed)
  Patient Consent for Virtual Visit        Kari [name] has provided verbal consent on 01/12/2023 for a virtual visit (video or telephone).   CONSENT FOR VIRTUAL VISIT FOR:  Kari [name]  By participating in this virtual visit I agree to the following:  I hereby voluntarily request, consent and authorize [facility] and its employed or contracted physicians, physician assistants, nurse practitioners or other licensed health care professionals (the Practitioner), to provide me with telemedicine health care services (the "Services") as deemed necessary by the treating Practitioner. I acknowledge and consent to receive the Services by the Practitioner via telemedicine. I understand that the telemedicine visit will involve communicating with the Practitioner through live audiovisual communication technology and the disclosure of certain medical information by electronic transmission. I acknowledge that I have been given the opportunity to request an in-person assessment or other available alternative prior to the telemedicine visit and am voluntarily participating in the telemedicine visit.  I understand that I have the right to withhold or withdraw my consent to the use of telemedicine in the course of my care at any time, without affecting my right to future care or treatment, and that the Practitioner or I may terminate the telemedicine visit at any time. I understand that I have the right to inspect all information obtained and/or recorded in the course of the telemedicine visit and may receive copies of available information for a reasonable fee.  I understand that some of the potential risks of receiving the Services via telemedicine include:  Delay or interruption in medical evaluation due to technological equipment failure or disruption; Information transmitted may not be sufficient (e.g. poor resolution of images) to allow for appropriate medical decision making by the  Practitioner; and/or  In rare instances, security protocols could fail, causing a breach of personal health information.  Furthermore, I acknowledge that it is my responsibility to provide information about my medical history, conditions and care that is complete and accurate to the best of my ability. I acknowledge that Practitioner's advice, recommendations, and/or decision may be based on factors not within their control, such as incomplete or inaccurate data provided by me or distortions of diagnostic images or specimens that may result from electronic transmissions. I understand that the practice of medicine is not an exact science and that Practitioner makes no warranties or guarantees regarding treatment outcomes. I acknowledge that a copy of this consent can be made available to me via my patient portal [facility] MyChart), or I can request a printed copy by calling the office of [facility].    I understand that my insurance will be billed for this visit.   I have read or had this consent read to me. I understand the contents of this consent, which adequately explains the benefits and risks of the Services being provided via telemedicine.  I have been provided ample opportunity to ask questions regarding this consent and the Services and have had my questions answered to my satisfaction. I give my informed consent for the services to be provided through the use of telemedicine in my medical care

## 2023-01-12 NOTE — Telephone Encounter (Signed)
I left a message for the patient to call our office to schedule a tele visit for pre-op 

## 2023-01-12 NOTE — Telephone Encounter (Signed)
   Name: Kari [name]  DOB: 1950-11-10  MRN: [id number]  Primary Cardiologist: Lewayne Bunting, MD   Preoperative team, please contact this patient and set up a phone call appointment for further preoperative risk assessment. Please obtain consent and complete medication review. Thank you for your help.  I confirm that guidance regarding antiplatelet and oral anticoagulation therapy has been completed and, if necessary, noted below.   Given hx of recurrent DVT when off of the anticoagulation hold Xarelto for 24 hours prior to procedure.  Please resume Xarelto as soon as possible postprocedure, at the discretion of the surgeon.      Joylene Grapes, NP 01/12/2023, 3:21 PM [facility]

## 2023-01-12 NOTE — Telephone Encounter (Signed)
Spoke with patient who is agreeable to do a tele visit on 7/26 at 3 pm. Consent given but meds need to be reviewed.

## 2023-01-12 NOTE — Telephone Encounter (Signed)
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     **This guidance is not considered finalized until pre-operative APP has relayed final recommendations.**

## 2023-01-14 NOTE — Progress Notes (Signed)
Remote pacemaker transmission.   

## 2023-01-15 ENCOUNTER — Ambulatory Visit (INDEPENDENT_AMBULATORY_CARE_PROVIDER_SITE_OTHER): Payer: Commercial Managed Care - PPO | Admitting: Orthopaedic Surgery

## 2023-01-15 DIAGNOSIS — M25552 Pain in left hip: Secondary | ICD-10-CM

## 2023-01-15 MED ORDER — TRIAMCINOLONE ACETONIDE 40 MG/ML IJ SUSP
80.0000 mg | INTRAMUSCULAR | Status: AC | PRN
Start: 2023-01-15 — End: 2023-01-15
  Administered 2023-01-15: 80 mg via INTRA_ARTICULAR

## 2023-01-15 MED ORDER — LIDOCAINE HCL 1 % IJ SOLN
4.0000 mL | INTRAMUSCULAR | Status: AC | PRN
Start: 2023-01-15 — End: 2023-01-15
  Administered 2023-01-15: 4 mL

## 2023-01-15 NOTE — Progress Notes (Signed)
Chief Complaint: Hip pain     History of Present Illness:    [name] is a 72 y.o. female presents with ongoing left hip pain for the last 6 months.  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  She does enjoy golfing and camping all of these activities are limited by the hip and groin pain.  She experiences the pain in the C shaped distribution.  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  She does have a pacemaker in place for this.  She was previously seen with Dr. Magnus Ivan who referred her today for further discussion and assessment with me    Surgical History:   none  PMH/PSH/Family History/Social History/Meds/Allergies:    Past Medical History:  Diagnosis Date  . Arthritis   . Chronic back pain   . Diverticulitis   . DVT (deep venous thrombosis) [facility]) age 40   s/p  knee arthroscopy   . Herpes zoster conjunctivitis   . Menopause   . Mobitz type 2 second degree AV block    2:1/notes 02/16/2013  . PONV (postoperative nausea and vomiting)   . Presence of permanent cardiac pacemaker   . Seasonal allergies    "spring & fall; not q year" (02/17/2013)  . Shoulder impingement    and left rotator cuff tear   Past Surgical History:  Procedure Laterality Date  . ACHILLES TENDON SURGERY    . ARTHROGRAM KNEE Right 1975  . CHOLECYSTECTOMY  1990's  . colonscopy    . ESOPHAGOGASTRODUODENOSCOPY    . EYE SURGERY     muscle release right eye  . INSERT / REPLACE / REMOVE PACEMAKER  02/17/2013   [employer] Advantio dual-chamber pacemaker, model [id number],  . KNEE ARTHROSCOPY Right 1982, 1983, 1995, 1996  . KNEE ARTHROSCOPY W/ ACL RECONSTRUCTION Left 1984  . LAPAROSCOPIC SIGMOID COLECTOMY N/A 01/01/2015   Procedure: LAPAROSCOPIC SIGMOID COLECTOMY;  Surgeon: Almond Lint, MD;  [facility]: [facility];  Service: General;  Laterality: N/A;  . LEAD EXTRACTION N/A  03/24/2022   Procedure: LEAD EXTRACTION;  Surgeon: [name], MD;  Location: [facility] INVASIVE CV LAB;  Service: Cardiovascular;  Laterality: N/A;  . PACEMAKER IMPLANT N/A 03/24/2022   Procedure: PACEMAKER IMPLANT;  Surgeon: [name], MD;  [facility]: [facility] INVAS[facility] CV LAB;  Service: Cardiovascular;  Laterality: N/A;  . PERMANENT PACEMAKER INSERTION N/A 02/17/2013   Procedure: PERMANENT PACEMAKER INSERTION;  Surgeon: [name], MD;  Location: [facility] CATH LAB;  Service: Cardiovascular;  Laterality: N/A;  . SHOULDER ARTHROSCOPY Left 09/05/2016   Procedure: LEFT SHOULDER ARTHROSCOPY , ACROMIOPLASTY AND ROTATOR CUFF REPAIR;  Surgeon: Marcene Corning, MD;  Location: [facility] OR;  Service: Orthopedics;  Laterality: Left;  . TONSILLECTOMY  1961  . TOTAL KNEE ARTHROPLASTY Right 2000; 06/2000   "replaced w/appropriate hardware" (02/17/2013)  . TOTAL KNEE REVISION Right 04/13/2019   Procedure: Revision Right Knee Arthroplasty;  Surgeon: Gean Birchwood, MD;  Location: [facility] ORS;  Service: Orthopedics;  Laterality: Right;  . TRIGGER FINGER RELEASE Right ~ 2012   "thumb" (02/17/2013)  . VAGINAL HYSTERECTOMY  ~ 1994   partial    Social History   Socioeconomic History  . Marital status: Married  Spouse name: Not on file  . Number of children: Not on file  . Years of education: Not on file  . Highest education level: Not on file  Occupational History  . Occupation: Teacher, 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[employer]    Comment: Administrator, Civil Service  Tobacco Use  . Smoking status: Never  . Smokeless tobacco: Never  Vaping Use  . Vaping Use: Never used  Substance and Sexual Activity  . Alcohol use: Yes    Comment: occasional wine   . Drug use: No  . Sexual activity: Yes    Birth control/protection: Surgical    Comment: 02/17/2013 "I have a same sex partner"  Other Topics Concern  . Not on file  Social History Narrative  . Not on file   Social Determinants of Health   Financial Resource Strain: Not on file  Food  Insecurity: Not on file  Transportation Needs: Not on file  Physical Activity: Not on file  Stress: Not on file  Social Connections: Not on file   Family History  Problem Relation Age of Onset  . Hypertension Mother   . Hyperlipidemia Mother   . Macular degeneration Mother   . COPD Mother   . Lung cancer Father   . Cancer Father        Lung  . COPD Father   . Diabetes Brother   . Breast cancer Paternal Aunt    Allergies  Allergen Reactions  . Eliquis [Apixaban] Palpitations    Diaphoresis, tachycardia  . Bactrim [Sulfamethoxazole-Trimethoprim] Hives  . Ceftin Itching    Tolerates amoxicillin  . Vancomycin Hives  . Levofloxacin Other (See Comments)    "spacey," tolerates Cipro   Current Outpatient Medications  Medication Sig Dispense Refill  . CALCIUM PO Take 1 tablet by mouth daily.    . Cholecalciferol (VITAMIN D3) 125 MCG (5000 UT) CAPS Take 5,000 Units by mouth daily.    . Coenzyme Q10 (COQ10) 200 MG CAPS Take by mouth daily at 6 (six) AM.    . Cyanocobalamin (VITAMIN B-12) 5000 MCG SUBL Place under the tongue daily. 5000 I U daily per patient    . diclofenac sodium (VOLTAREN) 1 % GEL Apply 1-2 g topically 4 (four) times daily as needed (knee pain.).    Marland Kitchen famotidine (PEPCID) 40 MG tablet Take 40 mg by mouth every morning.    . fluticasone (FLONASE) 50 MCG/ACT nasal spray Place 1 spray into both nostrils daily as needed for allergies or rhinitis.    Marland Kitchen levothyroxine (SYNTHROID) 25 MCG tablet Take 1 tablet (25 mcg total) by mouth daily. 30 tablet 3  . metoprolol succinate (TOPROL-XL) 25 MG 24 hr tablet Take 1/2 tablet (12.5 mg total) by mouth daily. 45 tablet 3  . Multiple Vitamin (MULTIVITAMIN WITH MINERALS) TABS Take 1 tablet by mouth daily.    . Multiple Vitamins-Minerals (AIRBORNE GUMMIES PO) Take 2 each by mouth daily.    . Omega-3 Fatty Acids (FISH OIL ULTRA) 1400 MG CAPS Take 1,400 mg by mouth daily.    Marland Kitchen oxyCODONE (ROXICODONE) 5 MG immediate release tablet Take  0.5-1 tablets (2.5-5 mg total) by mouth every 4 (four) hours as needed for severe pain. 15 tablet 0  . Probiotic Product (PROBIOTIC FORMULA PO) Take 1 capsule by mouth daily.    . rivaroxaban (XARELTO) 20 MG TABS tablet Take 1 tablet (20 mg total) by mouth daily with supper. 90 tablet 3  . rosuvastatin (CRESTOR) 5 MG tablet Take 1 tablet (  5 mg total) by mouth daily. 30 tablet 3  . valACYclovir (VALTREX) 1000 MG tablet Take 1 tablet (1,000 mg total) by mouth daily as needed. 90 tablet 2   No current facility-administered medications for this visit.   No results found.  Review of Systems:   A ROS was performed including pertinent positives and negatives as documented in the HPI.  Physical Exam :   Constitutional: NAD and appears stated age Neurological: Alert and oriented Psych: Appropriate affect and cooperative There were no vitals taken for this visit.   Comprehensive Musculoskeletal Exam:    Tenderness about the femoral acetabular joint.  She does have pain with flexion internal rotation to 20 degrees.  35 degrees of external rotation is without pain.  There is no crepitus.  She does have minimal pain with resisted abduction of the hip.  Positive FADIR  Imaging:     CT (left hip): CT chest abdomen pelvis in which the hip is visualized does show some narrowing of the left femoral acetabular joint with sclerotic changes consistent with early osteoarthritic findings  I personally reviewed and interpreted the radiographs.   Assessment:   72 y.o. female with left hip pain consistent with early or osteoarthritic findings.  That being said she has never had an injection and she is extremely active.  I do believe that an injection may get her many many months of relief at which point she can be more active this summer including golfing and going on trips.  I did discuss that with regard to treatment options I do not necessarily believe that an MRI would be helpful because given her tonus  grade 1-2 changes ultimately I believe that arthroplasty would be the correct surgical decision to get her definitive treatment and solution if needed.  That being said I think that there is a high likelihood of her getting many months of relief from this injection and delaying the need for further surgical intervention.  Plan :    -Left hip ultrasound-guided injection performed after verbal consent obtained    Procedure Note  Patient: Kari [name]             Date of Birth: [birth year]           MRN: [id number]             Visit Date: 01/15/2023  Procedures: Visit Diagnoses: No diagnosis found.  Large Joint Inj: L hip joint on 01/15/2023 11:43 AM Indications: pain Details: 22 G 3.5 in needle, ultrasound-guided anterolateral approach  Arthrogram: No  Medications: 4 mL lidocaine 1 %; 80 mg triamcinolone acetonide 40 MG/ML Outcome: tolerated well, no immediate complications Procedure, treatment alternatives, risks and benefits explained, specific risks discussed. Consent was given by the patient. Immediately prior to procedure a time out was called to verify the correct patient, procedure, equipment, support staff and site/side marked as required. Patient was prepped and draped in the usual sterile fashion.          I personally saw and evaluated the patient, and participated in the management and treatment plan.  Huel Cote, MD Attending Physician, Orthopedic Surgery  This document was dictated using Dragon voice recognition software. A reasonable attempt at proof reading has been made to minimize errors.

## 2023-01-18 ENCOUNTER — Encounter (HOSPITAL_BASED_OUTPATIENT_CLINIC_OR_DEPARTMENT_OTHER): Payer: Self-pay | Admitting: Orthopaedic Surgery

## 2023-01-20 ENCOUNTER — Other Ambulatory Visit (HOSPITAL_BASED_OUTPATIENT_CLINIC_OR_DEPARTMENT_OTHER): Payer: Self-pay | Admitting: Orthopaedic Surgery

## 2023-01-20 DIAGNOSIS — M25552 Pain in left hip: Secondary | ICD-10-CM

## 2023-01-30 ENCOUNTER — Other Ambulatory Visit (HOSPITAL_COMMUNITY): Payer: Self-pay

## 2023-02-14 ENCOUNTER — Telehealth: Payer: Self-pay | Admitting: Internal Medicine

## 2023-02-14 DIAGNOSIS — K5732 Diverticulitis of large intestine without perforation or abscess without bleeding: Secondary | ICD-10-CM

## 2023-02-14 MED ORDER — METRONIDAZOLE 500 MG PO TABS
500.0000 mg | ORAL_TABLET | Freq: Three times a day (TID) | ORAL | 0 refills | Status: AC
Start: 2023-02-14 — End: 2023-02-24

## 2023-02-14 MED ORDER — CIPROFLOXACIN HCL 500 MG PO TABS
500.0000 mg | ORAL_TABLET | Freq: Two times a day (BID) | ORAL | 0 refills | Status: DC
Start: 2023-02-14 — End: 2023-04-13

## 2023-02-14 NOTE — Telephone Encounter (Signed)
Received a page to the [facility] on call pager covering for [place] group. After eating at [employer], patient start developed diarrhea for a week afterwards and then she took old doses of Flagyl and Augmentin to try help with her symptoms. The diarrhea has improved, but now abdominal pain has gotten worse. She has had diverticulitis in the past, and this feels like a diverticulitis flare. Will send ciprofloxacin and flagyl for 10 days. Will CC Dr. Loreta Ave to this telephone note.

## 2023-02-17 DIAGNOSIS — K5730 Diverticulosis of large intestine without perforation or abscess without bleeding: Secondary | ICD-10-CM | POA: Diagnosis not present

## 2023-02-17 DIAGNOSIS — Z1211 Encounter for screening for malignant neoplasm of colon: Secondary | ICD-10-CM | POA: Diagnosis not present

## 2023-02-17 DIAGNOSIS — K219 Gastro-esophageal reflux disease without esophagitis: Secondary | ICD-10-CM | POA: Diagnosis not present

## 2023-02-17 DIAGNOSIS — R194 Change in bowel habit: Secondary | ICD-10-CM | POA: Diagnosis not present

## 2023-02-24 ENCOUNTER — Other Ambulatory Visit (HOSPITAL_COMMUNITY): Payer: Self-pay

## 2023-02-24 ENCOUNTER — Other Ambulatory Visit: Payer: Self-pay | Admitting: Family Medicine

## 2023-02-24 MED ORDER — LEVOTHYROXINE SODIUM 25 MCG PO TABS
25.0000 ug | ORAL_TABLET | Freq: Every day | ORAL | 0 refills | Status: DC
  Filled 2023-02-24: qty 30, 30d supply, fill #0

## 2023-02-28 ENCOUNTER — Telehealth: Payer: Commercial Managed Care - PPO | Admitting: Nurse Practitioner

## 2023-02-28 ENCOUNTER — Encounter: Payer: Commercial Managed Care - PPO | Admitting: Nurse Practitioner

## 2023-02-28 DIAGNOSIS — R399 Unspecified symptoms and signs involving the genitourinary system: Secondary | ICD-10-CM | POA: Diagnosis not present

## 2023-02-28 MED ORDER — PHENAZOPYRIDINE HCL 100 MG PO TABS: 100.0000 mg | ORAL_TABLET | Freq: Three times a day (TID) | ORAL | 0 refills | Status: DC | PRN

## 2023-02-28 MED ORDER — NITROFURANTOIN MONOHYD MACRO 100 MG PO CAPS: 100.0000 mg | ORAL_CAPSULE | Freq: Two times a day (BID) | ORAL | 0 refills | Status: DC

## 2023-02-28 NOTE — Addendum Note (Signed)
Addended by: Bennie Pierini on: 02/28/2023 01:02 PM   Modules accepted: Level of Service

## 2023-02-28 NOTE — Progress Notes (Signed)
Already addressed previously.

## 2023-02-28 NOTE — Progress Notes (Signed)

## 2023-02-28 NOTE — Addendum Note (Signed)
Addended by: Bennie Pierini on: 02/28/2023 01:15 PM   Modules accepted: Orders

## 2023-03-02 ENCOUNTER — Ambulatory Visit (HOSPITAL_COMMUNITY)
Admission: RE | Admit: 2023-03-02 | Discharge: 2023-03-02 | Disposition: A | Discharge status: Home / Self Care | Payer: Commercial Managed Care - PPO | Source: Ambulatory Visit | Attending: Orthopaedic Surgery | Admitting: Orthopaedic Surgery

## 2023-03-02 DIAGNOSIS — M25452 Effusion, left hip: Secondary | ICD-10-CM | POA: Diagnosis not present

## 2023-03-02 DIAGNOSIS — M25552 Pain in left hip: Secondary | ICD-10-CM | POA: Diagnosis not present

## 2023-03-02 DIAGNOSIS — M1612 Unilateral primary osteoarthritis, left hip: Secondary | ICD-10-CM | POA: Diagnosis not present

## 2023-03-02 NOTE — Progress Notes (Signed)
Per order and with [facility] rep, Joey, Changed device settings for MRI to DOO 70 for duration of study.    Tachy-therapies to off if applicable.   Will program device back to pre-MRI settings after completion of exam.

## 2023-03-02 NOTE — Progress Notes (Signed)
Informed of MRI for today.   Device system confirmed to be MRI conditional, with implant date > 6 weeks ago, and no evidence of abandoned or epicardial leads in review of most recent CXR Interrogation from today performed with assistance from industry.   Change device settings for MRI to DOO 70 for duration of study.    Tachy-therapies to off if applicable.  Program device back to pre-MRI settings after completion of exam.  Kari [name]  03/02/2023 1:42 PM

## 2023-03-06 ENCOUNTER — Other Ambulatory Visit (HOSPITAL_COMMUNITY): Payer: Self-pay

## 2023-03-16 ENCOUNTER — Telehealth: Payer: Self-pay | Admitting: Family Medicine

## 2023-03-16 ENCOUNTER — Other Ambulatory Visit: Payer: Self-pay | Admitting: Family Medicine

## 2023-03-16 DIAGNOSIS — Z0000 Encounter for general adult medical examination without abnormal findings: Secondary | ICD-10-CM

## 2023-03-16 NOTE — Telephone Encounter (Signed)
Patient scheduled for her physical on 04/24/23 at 3:30 but patient would like to come in before her physical to do her labs. Please place orders for labs and advice

## 2023-03-16 NOTE — Telephone Encounter (Signed)
Called and scheduled lab appt/8/21 Put in future orders.

## 2023-03-17 ENCOUNTER — Other Ambulatory Visit (HOSPITAL_COMMUNITY): Payer: Self-pay

## 2023-03-17 ENCOUNTER — Encounter: Payer: Self-pay | Admitting: Family Medicine

## 2023-03-17 ENCOUNTER — Other Ambulatory Visit: Payer: Self-pay | Admitting: Family Medicine

## 2023-03-17 DIAGNOSIS — E2839 Other primary ovarian failure: Secondary | ICD-10-CM

## 2023-03-17 MED ORDER — GOLYTELY 236 G PO SOLR
ORAL | 0 refills | Status: DC
  Filled 2023-03-17: qty 4000, 2d supply, fill #0

## 2023-03-18 ENCOUNTER — Telehealth (HOSPITAL_BASED_OUTPATIENT_CLINIC_OR_DEPARTMENT_OTHER): Payer: Self-pay

## 2023-03-19 NOTE — Progress Notes (Addendum)
Virtual Visit via Telephone Note   Because of Kari [name]'s co-morbid illnesses, she is at least at moderate risk for complications without adequate follow up.  This format is felt to be most appropriate for this patient at this time.  The patient did not have access to video technology/had technical difficulties with video requiring transitioning to audio format only (telephone).  All issues noted in this document were discussed and addressed.  No physical exam could be performed with this format.  Please refer to the patient's chart for her consent to telehealth for [facility].  Evaluation Performed:  Preoperative cardiovascular risk assessment _____________   Date:  03/20/2023   Patient ID:  Kari [name], DOB 02-Feb-1951, MRN [id number] Patient Location:  Home Provider location:   Office  Primary Care Provider:  Sharlene Dory, DO Primary Cardiologist:  Lewayne Bunting, MD  Chief Complaint / Patient Profile   72 y.o. y/o female with a h/o DVT on Xarelto, complete heart block status post pacemaker, history of lead failure and system extraction with new implant on 03/24/2022, hypertension,, palpitations, atrial tachycardia/atrial flutter, who is pending colonoscopy with Dr. Loreta Ave with [facility] on 03/27/2023 and presents today for telephonic preoperative cardiovascular risk assessment.  History of Present Illness    Kari Kari [name] is a 72 y.o. female who presents via audio/video conferencing for a telehealth visit today.  Pt was last seen in cardiology clinic on 10/29/2022 by [name], PA.  At that time [name] was doing well but continued to have some complaints of weakness which were not found to be cardiac in etiology..  The patient is now pending procedure as outlined above. Since her last visit, she has been in the ED  twice, currently in the ED for near syncope being worked up.    Past Medical History    Past Medical  History:  Diagnosis Date   Arthritis    Chronic back pain    Diverticulitis    DVT (deep venous thrombosis) [facility]) age 11   s/p  knee arthroscopy    Herpes zoster conjunctivitis    Menopause    Mobitz type 2 second degree AV block    2:1/notes 02/16/2013   PONV (postoperative nausea and vomiting)    Presence of permanent cardiac pacemaker    Seasonal allergies    "spring & fall; not q year" (02/17/2013)   Shoulder impingement    and left rotator cuff tear   Past Surgical History:  Procedure Laterality Date   ACHILLES TENDON SURGERY     ARTHROGRAM KNEE Right 1975   CHOLECYSTECTOMY  1990's   colonscopy     ESOPHAGOGASTRODUODENOSCOPY     EYE SURGERY     muscle release right eye   INSERT / REPLACE / REMOVE PACEMAKER  02/17/2013   [employer] Advantio dual-chamber pacemaker, model [id number],   KNEE ARTHROSCOPY Right 1982, 1983, 1995, 1996   KNEE ARTHROSCOPY W/ ACL RECONSTRUCTION Left 1984   LAPAROSCOPIC SIGMOID COLECTOMY N/A 01/01/2015   Procedure: LAPAROSCOPIC SIGMOID COLECTOMY;  Surgeon: Almond Lint, MD;  Location: [facility] OR;  Service: General;  Laterality: N/A;   LEAD EXTRACTION N/A 03/24/2022   Procedure: LEAD EXTRACTION;  Surgeon: [name], MD;  Location: [facility] INVASIVE CV LAB;  Service: Cardiovascular;  Laterality: N/A;   PACEMAKER IMPLANT N/A 03/24/2022   Procedure: PACEMAKER IMPLANT;  Surgeon: [name], MD;  [facility]: [facility] INVASIVE CV LAB;  Service: Cardiovascular;  Laterality: N/A;   PERMANENT  PACEMAKER INSERTION N/A 02/17/2013   Procedure: PERMANENT PACEMAKER INSERTION;  Surgeon: [name], MD;  Location[facility] CATH LAB;  Service: Cardiovascular;  Laterality: N/A;   SHOULDER ARTHROSCOPY Left 09/05/2016   Procedure: LEFT SHOULDER ARTHROSCOPY , ACROMIOPLASTY AND ROTATOR CUFF REPAIR;  Surgeon: Marcene Corning, MD;  Location: [facility] OR;  Service: Orthopedics;  Laterality: Left;   TONSILLECTOMY  1961   TOTAL KNEE ARTHROPLASTY Right 2000; 06/2000   "replaced w/appropriate  hardware" (02/17/2013)   TOTAL KNEE REVISION Right 04/13/2019   Procedure: Revision Right Knee Arthroplasty;  Surgeon: Gean Birchwood, MD;  Location: [facility] ORS;  Service: Orthopedics;  Laterality: Right;   TRIGGER FINGER RELEASE Right ~ 2012   "thumb" (02/17/2013)   VAGINAL HYSTERECTOMY  ~ 1994   partial     Allergies  Allergies  Allergen Reactions   Eliquis [Apixaban] Palpitations    Diaphoresis, tachycardia   Bactrim [Sulfamethoxazole-Trimethoprim] Hives   Ceftin Itching    Tolerates amoxicillin   Vancomycin Hives   Levofloxacin Other (See Comments)    "spacey," tolerates Cipro    Home Medications    Prior to Admission medications   Medication Sig Start Date End Date Taking? Authorizing Provider  CALCIUM PO Take 1 tablet by mouth daily.    [provider]  Cholecalciferol (VITAMIN D3) 125 MCG (5000 UT) CAPS Take 5,000 Units by mouth daily.    [provider]  ciprofloxacin (CIPRO) 500 MG tablet Take 1 tablet (500 mg total) by mouth 2 (two) times daily. 02/14/23   Kari Burn, MD  Coenzyme Q10 (COQ10) 200 MG CAPS Take by mouth daily at 6 (six) AM.    [provider]  Cyanocobalamin (VITAMIN B-12) 5000 MCG SUBL Place under the tongue daily. 5000 I U daily per patient    [provider]  diclofenac sodium (VOLTAREN) 1 % GEL Apply 1-2 g topically 4 (four) times daily as needed (knee pain.).    [provider]  famotidine (PEPCID) 40 MG tablet Take 40 mg by mouth every morning.    [provider]  fluticasone (FLONASE) 50 MCG/ACT nasal spray Place 1 spray into both nostrils daily as needed for allergies or rhinitis.    [provider]  levothyroxine (SYNTHROID) 25 MCG tablet Take 1 tablet (25 mcg total) by mouth daily before breakfast. 02/24/23   Kari, Jilda Roche, DO  metoprolol succinate (TOPROL-XL) 25 MG 24 hr tablet Take 1/2 tablet (12.5 mg total) by mouth daily. 12/31/22   [name], MD  Multiple Vitamin  (MULTIVITAMIN WITH MINERALS) TABS Take 1 tablet by mouth daily.    [provider]  Multiple Vitamins-Minerals (AIRBORNE GUMMIES PO) Take 2 each by mouth daily.    [provider]  nitrofurantoin, macrocrystal-monohydrate, (MACROBID) 100 MG capsule Take 1 capsule (100 mg total) by mouth 2 (two) times daily. 02/28/23   Daphine Deutscher Mary-Margaret, FNP  Omega-3 Fatty Acids (FISH OIL ULTRA) 1400 MG CAPS Take 1,400 mg by mouth daily.    [provider]  oxyCODONE (ROXICODONE) 5 MG immediate release tablet Take 0.5-1 tablets (2.5-5 mg total) by mouth every 4 (four) hours as needed for severe pain. 12/10/22   Glyn Ade, MD  phenazopyridine (PYRIDIUM) 100 MG tablet Take 1 tablet (100 mg total) by mouth 3 (three) times daily as needed for pain. 02/28/23   Daphine Deutscher, Mary-Margaret, FNP  polyethylene glycol (GOLYTELY) 236 g solution Use as directed 03/17/23     Probiotic Product (PROBIOTIC FORMULA PO) Take 1 capsule by mouth daily.  [provider]  rivaroxaban (XARELTO) 20 MG TABS tablet Take 1 tablet (20 mg total) by mouth daily with supper. 12/18/22   [name], MD  rosuvastatin (CRESTOR) 5 MG tablet Take 1 tablet (5 mg total) by mouth daily. 12/11/22   Sharlene Dory, DO  valACYclovir (VALTREX) 1000 MG tablet Take 1 tablet (1,000 mg total) by mouth daily as needed. 08/05/22   Sharlene Dory, DO    Physical Exam    Vital Signs:  Roque Cash does not have vital signs available for review today.  Given telephonic nature of communication, physical exam is limited. AAOx3. NAD. Normal affect.  Speech and respirations are unlabored.  Accessory Clinical Findings    None  Assessment & Plan    1.  Preoperative Cardiovascular Risk Assessment:  According to the Revised Cardiac Risk Index (RCRI), her Perioperative Risk of Major Cardiac Event is (%): 0.4  Patient is not cleared by cardiology to proceed with colonoscopy until work up in ED is  complete and she is seen again by cardiology.  I have spent 10 minutes on this encounter.    Joni Reining, NP  03/20/2023, 3:07 PM

## 2023-03-20 ENCOUNTER — Other Ambulatory Visit: Payer: Self-pay

## 2023-03-20 ENCOUNTER — Emergency Department (HOSPITAL_COMMUNITY): Payer: Commercial Managed Care - PPO

## 2023-03-20 ENCOUNTER — Ambulatory Visit: Payer: Commercial Managed Care - PPO | Attending: Cardiology | Admitting: Adult Health

## 2023-03-20 ENCOUNTER — Emergency Department (HOSPITAL_COMMUNITY)
Admission: EM | Admit: 2023-03-20 | Discharge: 2023-03-20 | Disposition: A | Discharge status: Home / Self Care | Payer: Commercial Managed Care - PPO | Attending: Emergency Medicine | Admitting: Emergency Medicine

## 2023-03-20 ENCOUNTER — Ambulatory Visit (HOSPITAL_BASED_OUTPATIENT_CLINIC_OR_DEPARTMENT_OTHER): Payer: Commercial Managed Care - PPO | Admitting: Orthopaedic Surgery

## 2023-03-20 ENCOUNTER — Encounter (HOSPITAL_COMMUNITY): Payer: Self-pay

## 2023-03-20 DIAGNOSIS — R531 Weakness: Secondary | ICD-10-CM | POA: Insufficient documentation

## 2023-03-20 DIAGNOSIS — Z1152 Encounter for screening for COVID-19: Secondary | ICD-10-CM | POA: Diagnosis not present

## 2023-03-20 DIAGNOSIS — Z7901 Long term (current) use of anticoagulants: Secondary | ICD-10-CM | POA: Insufficient documentation

## 2023-03-20 DIAGNOSIS — Z01810 Encounter for preprocedural cardiovascular examination: Secondary | ICD-10-CM | POA: Diagnosis not present

## 2023-03-20 DIAGNOSIS — R42 Dizziness and giddiness: Secondary | ICD-10-CM

## 2023-03-20 DIAGNOSIS — R55 Syncope and collapse: Secondary | ICD-10-CM | POA: Diagnosis not present

## 2023-03-20 DIAGNOSIS — R519 Headache, unspecified: Secondary | ICD-10-CM | POA: Diagnosis not present

## 2023-03-20 DIAGNOSIS — Z01818 Encounter for other preprocedural examination: Secondary | ICD-10-CM

## 2023-03-20 DIAGNOSIS — Z950 Presence of cardiac pacemaker: Secondary | ICD-10-CM | POA: Diagnosis not present

## 2023-03-20 DIAGNOSIS — I4892 Unspecified atrial flutter: Secondary | ICD-10-CM

## 2023-03-20 DIAGNOSIS — I6782 Cerebral ischemia: Secondary | ICD-10-CM | POA: Diagnosis not present

## 2023-03-20 LAB — CBC
HCT: 42.2 % (ref 36.0–46.0)
Hemoglobin: 13.6 g/dL (ref 12.0–15.0)
MCH: 31.3 pg (ref 26.0–34.0)
MCHC: 32.2 g/dL (ref 30.0–36.0)
MCV: 97 fL (ref 80.0–100.0)
Platelets: 244 10*3/uL (ref 150–400)
RBC: 4.35 MIL/uL (ref 3.87–5.11)
RDW: 14.6 % (ref 11.5–15.5)
WBC: 5.5 10*3/uL (ref 4.0–10.5)
nRBC: 0 % (ref 0.0–0.2)

## 2023-03-20 LAB — BASIC METABOLIC PANEL
Anion gap: 9 (ref 5–15)
BUN: 18 mg/dL (ref 8–23)
CO2: 27 mmol/L (ref 22–32)
Calcium: 9.3 mg/dL (ref 8.9–10.3)
Chloride: 103 mmol/L (ref 98–111)
Creatinine, Ser: 0.76 mg/dL (ref 0.44–1.00)
GFR, Estimated: >60 mL/min (ref >60)
Glucose, Bld: 112 mg/dL — ABNORMAL HIGH (ref 70–99)
Potassium: 4.2 mmol/L (ref 3.5–5.1)
Sodium: 139 mmol/L (ref 135–145)

## 2023-03-20 LAB — URINALYSIS, W/ REFLEX TO CULTURE (INFECTION SUSPECTED)
Bacteria, UA: NONE SEEN
Bilirubin Urine: NEGATIVE
Glucose, UA: NEGATIVE mg/dL
Hgb urine dipstick: NEGATIVE
Ketones, ur: NEGATIVE mg/dL
Leukocytes,Ua: NEGATIVE
Nitrite: NEGATIVE
Protein, ur: NEGATIVE mg/dL
Specific Gravity, Urine: 1.009 (ref 1.005–1.030)
pH: 5 (ref 5.0–8.0)

## 2023-03-20 LAB — TROPONIN I (HIGH SENSITIVITY)
Troponin I (High Sensitivity): 4 ng/L (ref <18)
Troponin I (High Sensitivity): 3 ng/L (ref <18)

## 2023-03-20 LAB — SARS CORONAVIRUS 2 BY RT PCR: SARS Coronavirus 2 by RT PCR: NEGATIVE

## 2023-03-20 LAB — TSH: TSH: 0.927 u[IU]/mL (ref 0.350–4.500)

## 2023-03-20 LAB — MAGNESIUM: Magnesium: 2.1 mg/dL (ref 1.7–2.4)

## 2023-03-20 MED ORDER — SODIUM CHLORIDE 0.9 % IV BOLUS: 1000.0000 mL | Freq: Once | INTRAVENOUS | Status: DC

## 2023-03-20 NOTE — Discharge Instructions (Signed)
An A1c, and thyroid panel is pending and you can check that on your MyChart.  Call Dr. Carmelia Roller so he can follow-up to make sure there is not more endocrine testing that needs to be done.  Also keep a log of your blood pressures to see if he needs to adjust your medications.

## 2023-03-20 NOTE — ED Provider Triage Note (Signed)
Emergency Medicine Provider Triage Evaluation Note  Kari [name] , a 72 y.o. female  was evaluated in triage.  Pt complains of fatigue, general weakness, some vague left-sided neck discomfort.  The symptoms started this morning although she felt a little bad on Tuesday morning as well.  She has had previous issues with her pacemaker and it was recently changed out.  She is concerned about the pacemaker.  She also had prior history of blood clots after the pacemaker placement.  She is on Xarelto.  No fevers, cough, cold symptoms.  Review of Systems  Positive: General fatigue, dizziness, left-sided neck discomfort Negative: Chest pain, shortness of breath, fevers, cough, cold symptoms, urinary symptoms.  Physical Exam  BP (!) 170/98 (BP Location: Right Arm)   Pulse 68   Temp (!) 97.5 F (36.4 C) (Oral)   Resp 16   Ht 5\' 8"  (1.727 m)   Wt 64 kg   SpO2 96%   BMI 21.45 kg/m  Gen:   Awake, no distress    Resp:  Normal effort   MSK:   Moves extremities without difficulty   Other:     Medical Decision Making  Medically screening exam initiated at 9:48 AM.  Appropriate orders placed.  Kari [name] was informed that the remainder of the evaluation will be completed by another provider, this initial triage assessment does not replace that evaluation, and the importance of remaining in the ED until their evaluation is complete.      Rolan Bucco, MD 03/20/23 [phone]

## 2023-03-20 NOTE — ED Triage Notes (Signed)
Pt c/o shakiness, cold hands and states, "I just don't feel good." Pt states this started this morning

## 2023-03-20 NOTE — ED Provider Notes (Signed)
[place] EMERGENCY DEPARTMENT AT [facility] Provider Note   CSN: [id number] Arrival date & time: 03/20/23  1607     History  No chief complaint on file.   [place] L Milbauer is a 72 y.o. female.  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  She had her pacemaker replaced about a year ago.  Following that she had acute blood loss anemia and subsequently had DVT.  She was started on anticoagulants which she is currently on.  She states that over the last few days she has had 2 episodes where she felt like she had something wash over her and felt like she might pass out.  She said her hands got cold.  She did not have any palpitations.  She felt her heart rate it was normal.  She has not had any recent illnesses.  No cough or cold symptoms.  No associated chest pain or tightness.  No fevers.  No urinary symptoms.  She had a similar type event about a year ago when she had had her pacemaker replaced.  It seems that the wire was dislodged.       Home Medications Prior to Admission medications   Medication Sig Start Date End Date Taking? Authorizing Provider  CALCIUM PO Take 1 tablet by mouth daily.    [provider]  Cholecalciferol (VITAMIN D3) 125 MCG (5000 UT) CAPS Take 5,000 Units by mouth daily.    [provider]  ciprofloxacin (CIPRO) 500 MG tablet Take 1 tablet (500 mg total) by mouth 2 (two) times daily. 02/14/23   Imogene Burn, MD  Coenzyme Q10 (COQ10) 200 MG CAPS Take by mouth daily at 6 (six) AM.    [provider]  Cyanocobalamin (VITAMIN B-12) 5000 MCG SUBL Place under the tongue daily. 5000 I U daily per patient    [provider]  diclofenac sodium (VOLTAREN) 1 % GEL Apply 1-2 g topically 4 (four) times daily as needed (knee pain.).    [provider]  famotidine (PEPCID) 40 MG tablet Take 40 mg  by mouth every morning.    [provider]  fluticasone (FLONASE) 50 MCG/ACT nasal spray Place 1 spray into both nostrils daily as needed for allergies or rhinitis.    [provider]  levothyroxine (SYNTHROID) 25 MCG tablet Take 1 tablet (25 mcg total) by mouth daily before breakfast. 02/24/23   Wendling, Jilda Roche, DO  metoprolol succinate (TOPROL-XL) 25 MG 24 hr tablet Take 1/2 tablet (12.5 mg total) by mouth daily. 12/31/22   [name], MD  Multiple Vitamin (MULTIVITAMIN WITH MINERALS) TABS Take 1 tablet by mouth daily.    [provider]  Multiple Vitamins-Minerals (AIRBORNE GUMMIES PO) Take 2 each by mouth daily.    [provider]  nitrofurantoin, macrocrystal-monohydrate, (MACROBID) 100 MG capsule Take 1 capsule (100 mg total) by mouth 2 (two) times daily. 02/28/23   Daphine Deutscher Mary-Margaret, FNP  Omega-3 Fatty Acids (FISH OIL ULTRA) 1400 MG CAPS Take 1,400 mg by mouth daily.    [provider]  oxyCODONE (ROXICODONE) 5 MG immediate release tablet Take 0.5-1 tablets (2.5-5 mg total) by mouth every 4 (four) hours as needed for severe pain. 12/10/22   Glyn Ade, MD  phenazopyridine (PYRIDIUM) 100 MG tablet Take 1 tablet (100 mg total) by mouth 3 (three) times daily as needed for pain. 02/28/23   Daphine Deutscher,  Mary-Margaret, FNP  polyethylene glycol (GOLYTELY) 236 g solution Use as directed 03/17/23     Probiotic Product (PROBIOTIC FORMULA PO) Take 1 capsule by mouth daily.    [provider]  rivaroxaban (XARELTO) 20 MG TABS tablet Take 1 tablet (20 mg total) by mouth daily with supper. 12/18/22   [name], MD  rosuvastatin (CRESTOR) 5 MG tablet Take 1 tablet (5 mg total) by mouth daily. 12/11/22   Sharlene Dory, DO  valACYclovir (VALTREX) 1000 MG tablet Take 1 tablet (1,000 mg total) by mouth daily as needed. 08/05/22   Sharlene Dory, DO      Allergies    Eliquis [apixaban], Bactrim [sulfamethoxazole-trimethoprim],  Ceftin, Vancomycin, and Levofloxacin    Review of Systems   Review of Systems  Constitutional:  Positive for fatigue. Negative for chills, diaphoresis and fever.  HENT:  Negative for congestion, rhinorrhea and sneezing.   Eyes: Negative.   Respiratory:  Negative for cough, chest tightness and shortness of breath.   Cardiovascular:  Negative for chest pain and leg swelling.  Gastrointestinal:  Negative for abdominal pain, diarrhea, nausea and vomiting.  Genitourinary:  Negative for difficulty urinating.  Musculoskeletal:  Negative for arthralgias and back pain.  Neurological:  Positive for light-headedness. Negative for dizziness, speech difficulty, weakness, numbness and headaches.    Physical Exam Updated Vital Signs BP (!) 145/80   Pulse 60   Temp (!) 97.5 F (36.4 C) (Oral)   Resp 18   Ht 5\' 8"  (1.727 m)   Wt 64 kg   SpO2 97%   BMI 21.45 kg/m  Physical Exam Constitutional:      Appearance: She is well-developed.  HENT:     Head: Normocephalic and atraumatic.  Eyes:     Pupils: Pupils are equal, round, and reactive to light.  Cardiovascular:     Rate and Rhythm: Normal rate and regular rhythm.     Heart sounds: Normal heart sounds.  Pulmonary:     Effort: Pulmonary effort is normal. No respiratory distress.     Breath sounds: Normal breath sounds. No wheezing or rales.  Chest:     Chest wall: No tenderness.  Abdominal:     General: Bowel sounds are normal.     Palpations: Abdomen is soft.     Tenderness: There is no abdominal tenderness. There is no guarding or rebound.  Musculoskeletal:        General: Normal range of motion.     Cervical back: Normal range of motion and neck supple.  Lymphadenopathy:     Cervical: No cervical adenopathy.  Skin:    General: Skin is warm and dry.     Findings: No rash.  Neurological:     General: No focal deficit present.     Mental Status: She is alert and oriented to person, place, and time.     ED Results / Procedures  / Treatments   Labs (all labs ordered are listed, but only abnormal results are displayed) Labs Reviewed  BASIC METABOLIC PANEL - Abnormal; Notable for the following components:      Result Value   Glucose, Bld 112 (*)    All other components within normal limits  URINALYSIS, W/ REFLEX TO CULTURE (INFECTION SUSPECTED) - Abnormal; Notable for the following components:   Color, Urine STRAW (*)    All other components within normal limits  SARS CORONAVIRUS 2 BY RT PCR  CBC  TROPONIN I (HIGH SENSITIVITY)  TROPONIN I (HIGH SENSITIVITY)  EKG EKG Interpretation Date/Time:  Friday March 20 2023 09:33:05 EDT Ventricular Rate:  74 PR Interval:  210 QRS Duration:  142 QT Interval:  426 QTC Calculation: 472 R Axis:   65  Text Interpretation: Atrial-sensed ventricular-paced rhythm with prolonged AV conduction Abnormal ECG When compared with ECG of 08-Sep-2022 18:27, PREVIOUS ECG IS PRESENT since last tracing no significant change Confirmed by Rolan Bucco [phone]) on 03/20/2023 9:36:41 AM  Radiology CT Head Wo Contrast  Result Date: 03/20/2023 CLINICAL DATA:  Headache, new onset (Age >= 51y) EXAM: CT HEAD WITHOUT CONTRAST TECHNIQUE: Contiguous axial images were obtained from the base of the skull through the vertex without intravenous contrast. RADIATION DOSE REDUCTION: This exam was performed according to the departmental dose-optimization program which includes automated exposure control, adjustment of the mA and/or kV according to patient size and/or use of iterative reconstruction technique. COMPARISON:  CT Head 09/07/22 FINDINGS: Brain: No evidence of acute infarction, hemorrhage, hydrocephalus, extra-axial collection or mass lesion/mass effect. Sequela of mild chronic microvascular ischemic change. 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 Vascular: No hyperdense vessel or unexpected calcification. Skull: Normal. Negative for fracture or focal lesion. Sinuses/Orbits: No middle ear  or mastoid effusion. Paranasal sinuses are clear. Orbits are unremarkable. Other: None. IMPRESSION: No acute intracranial abnormality. Electronically Signed   By: Lorenza Cambridge M.D.   On: 03/20/2023 12:26   DG Chest 2 View  Result Date: 03/20/2023 CLINICAL DATA:  shaky EXAM: CHEST - 2 VIEW COMPARISON:  09/07/2022. FINDINGS: Bilateral lung fields are clear. Bilateral costophrenic angles are clear. Normal cardio-mediastinal silhouette. There is a left sided 2-lead pacemaker. No acute osseous abnormalities. The soft tissues are within normal limits. IMPRESSION: *No active cardiopulmonary disease. Electronically Signed   By: Jules Schick M.D.   On: 03/20/2023 11:00    Procedures Procedures    Medications Ordered in ED Medications  sodium chloride 0.9 % bolus 1,000 mL (has no administration in time range)    ED Course/ Medical Decision Making/ A&P                             Medical Decision Making Amount and/or Complexity of Data Reviewed Labs: ordered. Radiology: ordered.   Patient is a 72 year old female who presents with near syncopal type event today.  She had another type episode 3 days ago while she was at work.  Her EKG shows a paced rhythm.  Her labs are nonconcerning.  She had a chest x-ray two-view which shows no acute abnormalities.  This was interpreted by me and confirmed by the radiologist.  Head CT does not show any acute abnormality.  She does not have any suggestions of infection.  She does not have any neurologic deficits or concerns for an acute stroke.  She is concerned about her pacemaker given that she had a similar type event when her pacemaker had to be replaced.  Will plan on interrogating her pacemaker.  I spoke with the cardiology team who will evaluate the patient.  Care turned over to Dr. Anitra Lauth pending cardiology evaluation.  Final Clinical Impression(s) / ED Diagnoses Final diagnoses:  Near syncope    Rx / DC Orders ED Discharge Orders     None          Rolan Bucco, MD 03/20/23 1620

## 2023-03-20 NOTE — ED Notes (Signed)
Labs added on to specimen sent at 1139.

## 2023-03-20 NOTE — ED Provider Notes (Signed)
Patient was seen by cardiology and at this time pacemaker is normal without evidence of dysrhythmia or malfunctioning.  Labs are reassuring.  Unclear what is causing the symptoms.  Possible endocrine in nature.  She has been compliant with her medications.  Is hypertensive here however unclear if that is due to being in the hospital or more consistent issue.  She will monitor at home.  Will also check an A1c.  At this time no indication for admission but will have her follow-up with her PCP.   Gwyneth Sprout, MD 03/20/23 [phone]

## 2023-03-20 NOTE — Consult Note (Addendum)
Cardiology Consultation   Patient ID: Kari [name] MRN: [id number]; DOB: 09-21-1950  Admit date: 03/20/2023 Date of Consult: 03/20/2023  PCP:  Sharlene Dory, DO   [facility] Providers Cardiologist:  Lewayne Bunting, MD  Electrophysiologist:  Lewayne Bunting, MD  {  Patient Profile:   Kari [name] is a 72 y.o. female with a hx of DVT 03/2022 on [facility], complete heart block status post pacemaker implanted initially in 2014, history of lead failure and system extraction with new implant 03/24/2022 who is being seen 03/20/2023 for the evaluation of syncope at the request of Dr. Fredderick Phenix.  History of Present Illness:   Kari [name] is mainly followed by EP as an outpatient.  Echo in July 2023 showed LVEF 65 to 70%, mild LVH, normal RV SF.  Patient presented to [facility] 09/07/2022 with reports of feeling shaky/tremulous and she was found to be hypertensive.  Workup in the ER was unremarkable.  She had a second ER visit September 08, 2022 with the same symptoms.  Heart rate 65.  She noted she started a new drink/supplement otherwise her usual routine.  Pacemaker was checked without concerning findings.  She was seen in the clinic 116 for the same symptoms.  Device check in depth was normal.  Patient wore a monitor that did not show sustained arrhythmias, 1 solitary episode of PMT.  Patient was last seen 10/29/2022 and was overall doing okay.  Patient presented to the ER 03/20/2023 for presyncope.  For the last 2 days she has felt as if she is going to pass out. She feels weak and shaky. She denies significant dizziness or lightheadedness. NO chest pain, SOB, LLE. She is a nurse in the ED and when she had initial symptoms, she sat down and EKG/vitals were normal.   In the ER blood pressure 145/80, pulse 60, afebrile, respiratory rate 18, 97% O2.  EKG shows paced rhythm.  HS trop normal. Hgb 13.6, K4.2, Scr 0.76, BUN 18. She was given IV fluids. EKG showed AV paced rhythm.  We are awaiting pacemaker evaluation.  Past Medical History:  Diagnosis Date   Arthritis    Chronic back pain    Diverticulitis    DVT (deep venous thrombosis) [facility]) age 44   s/p  knee arthroscopy    Herpes zoster conjunctivitis    Menopause    Mobitz type 2 second degree AV block    2:1/notes 02/16/2013   PONV (postoperative nausea and vomiting)    Presence of permanent cardiac pacemaker    Seasonal allergies    "spring & fall; not q year" (02/17/2013)   Shoulder impingement    and left rotator cuff tear    Past Surgical History:  Procedure Laterality Date   ACHILLES TENDON SURGERY     ARTHROGRAM KNEE Right 1975   CHOLECYSTECTOMY  1990's   colonscopy     ESOPHAGOGASTRODUODENOSCOPY     EYE SURGERY     muscle release right eye   INSERT / REPLACE / REMOVE PACEMAKER  02/17/2013   [employer] Advantio dual-chamber pacemaker, model [id number],   KNEE ARTHROSCOPY Right 1982, 1983, 1995, 1996   KNEE ARTHROSCOPY W/ ACL RECONSTRUCTION Left 1984   LAPAROSCOPIC SIGMOID COLECTOMY N/A 01/01/2015   Procedure: LAPAROSCOPIC SIGMOID COLECTOMY;  Surgeon: Almond Lint, MD;  Location: [facility] OR;  Service: General;  Laterality: N/A;   LEAD EXTRACTION N/A 03/24/2022   Procedure: LEAD EXTRACTION;  Surgeon: [name], MD;  Location: [facility] INVASIVE CV LAB;  Service:  Cardiovascular;  Laterality: N/A;   PACEMAKER IMPLANT N/A 03/24/2022   Procedure: PACEMAKER IMPLANT;  Surgeon: [name], MD;  Location: [facility] INVASIVE CV LAB;  Service: Cardiovascular;  Laterality: N/A;   PERMANENT PACEMAKER INSERTION N/A 02/17/2013   Procedure: PERMANENT PACEMAKER INSERTION;  Surgeon: [name], MD;  Location: [facility] CATH LAB;  Service: Cardiovascular;  Laterality: N/A;   SHOULDER ARTHROSCOPY Left 09/05/2016   Procedure: LEFT SHOULDER ARTHROSCOPY , ACROMIOPLASTY AND ROTATOR CUFF REPAIR;  Surgeon: Marcene Corning, MD;  Location: [facility] OR;  Service: Orthopedics;  Laterality: Left;   TONSILLECTOMY  1961   TOTAL KNEE  ARTHROPLASTY Right 2000; 06/2000   "replaced w/appropriate hardware" (02/17/2013)   TOTAL KNEE REVISION Right 04/13/2019   Procedure: Revision Right Knee Arthroplasty;  Surgeon: Gean Birchwood, MD;  Location: [facility] ORS;  Service: Orthopedics;  Laterality: Right;   TRIGGER FINGER RELEASE Right ~ 2012   "thumb" (02/17/2013)   VAGINAL HYSTERECTOMY  ~ 1994   partial      Home Medications:  Prior to Admission medications   Medication Sig Start Date End Date Taking? Authorizing Provider  CALCIUM PO Take 1 tablet by mouth daily.    [provider]  Cholecalciferol (VITAMIN D3) 125 MCG (5000 UT) CAPS Take 5,000 Units by mouth daily.    [provider]  ciprofloxacin (CIPRO) 500 MG tablet Take 1 tablet (500 mg total) by mouth 2 (two) times daily. 02/14/23   Kari Burn, MD  Coenzyme Q10 (COQ10) 200 MG CAPS Take by mouth daily at 6 (six) AM.    [provider]  Cyanocobalamin (VITAMIN B-12) 5000 MCG SUBL Place under the tongue daily. 5000 I U daily per patient    [provider]  diclofenac sodium (VOLTAREN) 1 % GEL Apply 1-2 g topically 4 (four) times daily as needed (knee pain.).    [provider]  famotidine (PEPCID) 40 MG tablet Take 40 mg by mouth every morning.    [provider]  fluticasone (FLONASE) 50 MCG/ACT nasal spray Place 1 spray into both nostrils daily as needed for allergies or rhinitis.    [provider]  levothyroxine (SYNTHROID) 25 MCG tablet Take 1 tablet (25 mcg total) by mouth daily before breakfast. 02/24/23   Wendling, Jilda Roche, DO  metoprolol succinate (TOPROL-XL) 25 MG 24 hr tablet Take 1/2 tablet (12.5 mg total) by mouth daily. 12/31/22   [name], MD  Multiple Vitamin (MULTIVITAMIN WITH MINERALS) TABS Take 1 tablet by mouth daily.    [provider]  Multiple Vitamins-Minerals (AIRBORNE GUMMIES PO) Take 2 each by mouth daily.    [provider]  nitrofurantoin, macrocrystal-monohydrate,  (MACROBID) 100 MG capsule Take 1 capsule (100 mg total) by mouth 2 (two) times daily. 02/28/23   Daphine Deutscher Mary-Margaret, FNP  Omega-3 Fatty Acids (FISH OIL ULTRA) 1400 MG CAPS Take 1,400 mg by mouth daily.    [provider]  oxyCODONE (ROXICODONE) 5 MG immediate release tablet Take 0.5-1 tablets (2.5-5 mg total) by mouth every 4 (four) hours as needed for severe pain. 12/10/22   Glyn Ade, MD  phenazopyridine (PYRIDIUM) 100 MG tablet Take 1 tablet (100 mg total) by mouth 3 (three) times daily as needed for pain. 02/28/23   Daphine Deutscher, Mary-Margaret, FNP  polyethylene glycol (GOLYTELY) 236 g solution Use as directed 03/17/23     Probiotic Product (PROBIOTIC FORMULA PO) Take 1 capsule by mouth daily.    [provider]  rivaroxaban (XARELTO) 20 MG TABS tablet Take 1  tablet (20 mg total) by mouth daily with supper. 12/18/22   [name], MD  rosuvastatin (CRESTOR) 5 MG tablet Take 1 tablet (5 mg total) by mouth daily. 12/11/22   Sharlene Dory, DO  valACYclovir (VALTREX) 1000 MG tablet Take 1 tablet (1,000 mg total) by mouth daily as needed. 08/05/22   Sharlene Dory, DO    Inpatient Medications: Scheduled Meds:  Continuous Infusions:  sodium chloride     PRN Meds:   Allergies:    Allergies  Allergen Reactions   Eliquis [Apixaban] Palpitations    Diaphoresis, tachycardia   Bactrim [Sulfamethoxazole-Trimethoprim] Hives   Ceftin Itching    Tolerates amoxicillin   Vancomycin Hives   Levofloxacin Other (See Comments)    "spacey," tolerates Cipro    Social History:   Social History   Socioeconomic History   Marital status: Married    Spouse name: Not on file   Number of children: Not on file   Years of education: Not on file   Highest education level: Not on file  Occupational History   Occupation: Teacher, 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[employer]    Comment: [employer]  Tobacco Use   Smoking status: Never   Smokeless tobacco: Never  Vaping  Use   Vaping status: Never Used  Substance and Sexual Activity   Alcohol use: Yes    Comment: occasional wine    Drug use: No   Sexual activity: Yes    Birth control/protection: Surgical    Comment: 02/17/2013 "I have a same sex partner"  Other Topics Concern   Not on file  Social History Narrative   Not on file   Social Determinants of Health   Financial Resource Strain: Not on file  Food Insecurity: Not on file  Transportation Needs: Not on file  Physical Activity: Not on file  Stress: Not on file  Social Connections: Not on file  Intimate Partner Violence: Not on file    Family History:    Family History  Problem Relation Age of Onset   Hypertension Mother    Hyperlipidemia Mother    Macular degeneration Mother    COPD Mother    Lung cancer Father    Cancer Father        Lung   COPD Father    Diabetes Brother    Breast cancer Paternal Aunt      ROS:  Please see the history of present illness.   All other ROS reviewed and negative.     Physical Exam/Data:   Vitals:   03/20/23 0930 03/20/23 0932 03/20/23 1348  BP: (!) 170/98  (!) 145/80  Pulse: 68  60  Resp: 16  18  Temp: (!) 97.5 F (36.4 C)  (!) 97.5 F (36.4 C)  TempSrc: Oral  Oral  SpO2: 96%  97%  Weight:  64 kg   Height:  5\' 8"  (1.727 m)    No intake or output data in the 24 hours ending 03/20/23 1616    03/20/2023    9:32 AM 12/10/2022   10:14 PM 11/11/2022    7:19 AM  Last 3 Weights  Weight (lbs) 141 lb 1.5 oz 141 lb 1.5 oz 143 lb  Weight (kg) 64 kg 64 kg 64.864 kg     Body mass index is 21.45 kg/m.  General:  Well nourished, well developed, in no acute distress HEENT: normal Neck: no JVD Vascular: No carotid bruits; Distal pulses 2+ bilaterally Cardiac:  normal  S1, S2; RRR; no murmur  Lungs:  clear to auscultation bilaterally, no wheezing, rhonchi or rales  Abd: soft, nontender, no hepatomegaly  Ext: no edema Musculoskeletal:  No deformities, BUE and BLE strength normal and  equal Skin: warm and dry  Neuro:  CNs 2-12 intact, no focal abnormalities noted Psych:  Normal affect   EKG:  The EKG was personally reviewed and demonstrates:   Telemetry:  Telemetry was personally reviewed and demonstrates:  No tele  Relevant CV Studies:  Echo limited 03/2022  1. Left ventricular ejection fraction, by estimation, is 60 to 65%. The  left ventricle has normal function. The left ventricle has no regional  wall motion abnormalities.   2. Right ventricular systolic function is normal. The right ventricular  size is normal. There is normal pulmonary artery systolic pressure. The  estimated right ventricular systolic pressure is 22.4 mmHg.   3. The mitral valve is normal in structure. No evidence of mitral  stenosis.   4. The aortic valve is grossly normal.   Echo 02/2022 1. Left ventricular ejection fraction, by estimation, is 65 to 70%. The  left ventricle has normal function. The left ventricle has no regional  wall motion abnormalities. There is mild left ventricular hypertrophy.  Left ventricular diastolic parameters  are indeterminate.   2. Right ventricular systolic function is normal. The right ventricular  size is normal.   3. The mitral valve is normal in structure. No evidence of mitral valve  regurgitation. No evidence of mitral stenosis.   4. The aortic valve has an indeterminant number of cusps. Aortic valve  regurgitation is not visualized. No aortic stenosis is present.   5. IVC is small suggesting low RA pressure and hypovolemia.   Laboratory Data:  High Sensitivity Troponin:   Recent Labs  Lab 03/20/23 0959 03/20/23 1139  TROPONINIHS 4 3     Chemistry Recent Labs  Lab 03/20/23 0959  NA 139  K 4.2  CL 103  CO2 27  GLUCOSE 112*  BUN 18  CREATININE 0.76  CALCIUM 9.3  GFRNONAA >60  ANIONGAP 9    No results for input(s): "PROT", "ALBUMIN", "AST", "ALT", "ALKPHOS", "BILITOT" in the last 168 hours. Lipids No results for input(s):  "CHOL", "TRIG", "HDL", "LABVLDL", "LDLCALC", "CHOLHDL" in the last 168 hours.  Hematology Recent Labs  Lab 03/20/23 0959  WBC 5.5  RBC 4.35  HGB 13.6  HCT 42.2  MCV 97.0  MCH 31.3  MCHC 32.2  RDW 14.6  PLT 244   Thyroid No results for input(s): "TSH", "FREET4" in the last 168 hours.  BNPNo results for input(s): "BNP", "PROBNP" in the last 168 hours.  DDimer No results for input(s): "DDIMER" in the last 168 hours.   Radiology/Studies:  CT Head Wo Contrast  Result Date: 03/20/2023 CLINICAL DATA:  Headache, new onset (Age >= 51y) EXAM: CT HEAD WITHOUT CONTRAST TECHNIQUE: Contiguous axial images were obtained from the base of the skull through the vertex without intravenous contrast. RADIATION DOSE REDUCTION: This exam was performed according to the departmental dose-optimization program which includes automated exposure control, adjustment of the mA and/or kV according to patient size and/or use of iterative reconstruction technique. COMPARISON:  CT Head 09/07/22 FINDINGS: Brain: No evidence of acute infarction, hemorrhage, hydrocephalus, extra-axial collection or mass lesion/mass effect. Sequela of mild chronic microvascular ischemic change. 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 Vascular: No hyperdense vessel or unexpected calcification. Skull: Normal. Negative for fracture or focal lesion. Sinuses/Orbits: No middle ear  or mastoid effusion. Paranasal sinuses are clear. Orbits are unremarkable. Other: None. IMPRESSION: No acute intracranial abnormality. Electronically Signed   By: Lorenza Cambridge M.D.   On: 03/20/2023 12:26   DG Chest 2 View  Result Date: 03/20/2023 CLINICAL DATA:  shaky EXAM: CHEST - 2 VIEW COMPARISON:  09/07/2022. FINDINGS: Bilateral lung fields are clear. Bilateral costophrenic angles are clear. Normal cardio-mediastinal silhouette. There is a left sided 2-lead pacemaker. No acute osseous abnormalities. The soft tissues are within normal limits. IMPRESSION: *No  active cardiopulmonary disease. Electronically Signed   By: Jules Schick M.D.   On: 03/20/2023 11:00     Assessment and Plan:   Pre-syncope - patient reports episodes of weakness and shakiness. No lightheadedness, dizziness, palpitations, fever, chills, chest pain or SOB reported - appears she has been worked up in the ED for similar episodes earlier this year - work-up this admission so far normal. Labs unremarkable - CT head normal - interrogation of her device has not been completed yet - most recent interrogation showed 6 atrial arrhythmias, longest 83 episodes, AF burden less than 1% - check orthostatics - check echo - check TSH and Mag - consider US carotids - continue tele  CHB s/p PPM - device interrogation pending - followed by EP as OP  AT/Aflutter - most recent device check showed less than 1% burden - episode in 05/2022, only 27 seconds  - CHADSVASC of 2 for female, HTN. She is on Xarelto 20mg  daily - Toprol 12.5mg  daily for rate control  For questions or updates, please contact [facility] Please consult [email or web address] for contact info under    Signed, Cadence David Stall, PA-C  03/20/2023 4:16 PM   I have examined the patient and reviewed assessment and plan and discussed with patient.  Agree with above as stated.    Normal cardiac exam.  I spoke with the [facility] Scientific device rep.  Pacemaker interrogation was normal.  No evidence of any arrhythmias based on the pacemaker.  Symptoms have correlated to when she was eating but no other GI symptoms.  No abdominal pain, nausea, vomiting, diarrhea.  Unclear etiology of her symptoms.  No cardiac etiology as her cardiac exam is normal.  Will inform Dr. Ladona Ridgel.  No further cardiac testing planned.  Lance Muss

## 2023-03-24 ENCOUNTER — Ambulatory Visit (HOSPITAL_BASED_OUTPATIENT_CLINIC_OR_DEPARTMENT_OTHER)
Admission: RE | Admit: 2023-03-24 | Discharge: 2023-03-24 | Disposition: A | Discharge status: Home / Self Care | Payer: Commercial Managed Care - PPO | Source: Ambulatory Visit | Attending: Family Medicine | Admitting: Family Medicine

## 2023-03-24 DIAGNOSIS — E2839 Other primary ovarian failure: Secondary | ICD-10-CM | POA: Insufficient documentation

## 2023-03-24 DIAGNOSIS — Z780 Asymptomatic menopausal state: Secondary | ICD-10-CM | POA: Diagnosis not present

## 2023-03-24 DIAGNOSIS — M85852 Other specified disorders of bone density and structure, left thigh: Secondary | ICD-10-CM | POA: Diagnosis not present

## 2023-03-25 ENCOUNTER — Encounter (INDEPENDENT_AMBULATORY_CARE_PROVIDER_SITE_OTHER): Payer: Self-pay

## 2023-03-29 ENCOUNTER — Other Ambulatory Visit: Payer: Self-pay | Admitting: Family Medicine

## 2023-03-30 ENCOUNTER — Other Ambulatory Visit (HOSPITAL_COMMUNITY): Payer: Self-pay

## 2023-03-30 MED ORDER — LEVOTHYROXINE SODIUM 25 MCG PO TABS
25.0000 ug | ORAL_TABLET | Freq: Every day | ORAL | 0 refills | Status: DC
  Filled 2023-03-30: qty 30, 30d supply, fill #0

## 2023-04-08 ENCOUNTER — Other Ambulatory Visit: Payer: Self-pay | Admitting: Family Medicine

## 2023-04-08 ENCOUNTER — Other Ambulatory Visit (HOSPITAL_COMMUNITY): Payer: Self-pay

## 2023-04-08 MED ORDER — ROSUVASTATIN CALCIUM 5 MG PO TABS
5.0000 mg | ORAL_TABLET | Freq: Every day | ORAL | 3 refills | Status: DC
  Filled 2023-04-08: qty 30, 30d supply, fill #0
  Filled 2023-05-10: qty 30, 30d supply, fill #1
  Filled 2023-06-09: qty 30, 30d supply, fill #2
  Filled 2023-07-08: qty 30, 30d supply, fill #3

## 2023-04-09 ENCOUNTER — Other Ambulatory Visit (HOSPITAL_COMMUNITY): Payer: Self-pay

## 2023-04-13 ENCOUNTER — Telehealth: Payer: Commercial Managed Care - PPO | Admitting: Physician Assistant

## 2023-04-13 DIAGNOSIS — B379 Candidiasis, unspecified: Secondary | ICD-10-CM

## 2023-04-13 DIAGNOSIS — T3695XA Adverse effect of unspecified systemic antibiotic, initial encounter: Secondary | ICD-10-CM | POA: Diagnosis not present

## 2023-04-13 MED ORDER — FLUCONAZOLE 150 MG PO TABS: 150.0000 mg | ORAL_TABLET | ORAL | 0 refills | Status: DC | PRN

## 2023-04-13 NOTE — Progress Notes (Signed)

## 2023-04-14 ENCOUNTER — Ambulatory Visit: Payer: Commercial Managed Care - PPO | Admitting: Internal Medicine

## 2023-04-14 ENCOUNTER — Encounter: Payer: Self-pay | Admitting: Internal Medicine

## 2023-04-14 VITALS — BP 152/84 | HR 71 | Ht 68.0 in | Wt 155.0 lb

## 2023-04-14 DIAGNOSIS — I442 Atrioventricular block, complete: Secondary | ICD-10-CM

## 2023-04-14 LAB — CUP PACEART INCLINIC DEVICE CHECK
Date Time Interrogation Session: 20240820204102
Implantable Lead Connection Status: 753985
Implantable Lead Connection Status: 753985
Implantable Lead Implant Date: 20230731
Implantable Lead Implant Date: 20230731
Implantable Lead Location: 753859
Implantable Lead Location: 753860
Implantable Lead Model: 7841
Implantable Lead Model: 7842
Implantable Lead Serial Number: 1215821
Implantable Lead Serial Number: 1304218
Implantable Pulse Generator Implant Date: 20230731
Pulse Gen Serial Number: 600527

## 2023-04-14 NOTE — Patient Instructions (Addendum)
Medication Instructions:  Your physician has recommended you make the following change in your medication:  STOP your Xarelto in mid Sept.  Lab Work: None ordered.  If you have labs (blood work) drawn today and your tests are completely normal, you will receive your results only by: MyChart Message (if you have MyChart) OR A paper copy in the mail If you have any lab test that is abnormal or we need to change your treatment, we will call you to review the results.  Testing/Procedures: None ordered.  Follow-Up: At [facility], you and your health needs are our priority.  As part of our continuing mission to provide you with exceptional heart care, we have created designated Provider Care Teams.  These Care Teams include your primary Cardiologist (physician) and Advanced Practice Providers (APPs -  Physician Assistants and Nurse Practitioners) who all work together to provide you with the care you need, when you need it.  Your next appointment:   1 year(s)  The format for your next appointment:   In Person  Provider:   Lewayne Bunting, MD{or one of the following Advanced Practice Providers on your designated Care Team:   Francis Dowse, [place] Casimiro Needle "Mardelle Matte" [place], [place] Earnest Rosier, NP   Important Information About Sugar

## 2023-04-14 NOTE — Progress Notes (Signed)
HPI Mrs. Kari [name] returns today for followup. She is a pleasant 72 yo woman with CHB, who developed noise on her ventricular lead resulting in failure to pace and recurrent syncope and near syncope. She underwent extraction of her ventricular lead as well as her atrial lead. She has done well since her new device was placed. She denies chest pain or sob. No syncope. She did get a left arm DVT after the procedure. She has been on xarelto. No additional syncope.  Allergies  Allergen Reactions   Eliquis [Apixaban] Palpitations    Diaphoresis, tachycardia   Bactrim [Sulfamethoxazole-Trimethoprim] Hives   Ceftin Itching    Tolerates amoxicillin   Vancomycin Hives   Levofloxacin Other (See Comments)    "spacey," tolerates Cipro     Current Outpatient Medications  Medication Sig Dispense Refill   CALCIUM PO Take 1 tablet by mouth daily.     Cholecalciferol (VITAMIN D3) 125 MCG (5000 UT) CAPS Take 5,000 Units by mouth daily.     Coenzyme Q10 (COQ10) 200 MG CAPS Take by mouth daily at 6 (six) AM.     Cyanocobalamin (VITAMIN B-12) 5000 MCG SUBL Place under the tongue daily. 5000 I U daily per patient     diclofenac sodium (VOLTAREN) 1 % GEL Apply 1-2 g topically 4 (four) times daily as needed (knee pain.).     famotidine (PEPCID) 40 MG tablet Take 40 mg by mouth every morning.     fluconazole (DIFLUCAN) 150 MG tablet Take 1 tablet (150 mg total) by mouth every 3 (three) days as needed. 2 tablet 0   fluticasone (FLONASE) 50 MCG/ACT nasal spray Place 1 spray into both nostrils daily as needed for allergies or rhinitis.     levothyroxine (SYNTHROID) 25 MCG tablet Take 1 tablet (25 mcg total) by mouth daily before breakfast. 30 tablet 0   metoprolol succinate (TOPROL-XL) 25 MG 24 hr tablet Take 1/2 tablet (12.5 mg total) by mouth daily. 45 tablet 3   Multiple Vitamin (MULTIVITAMIN WITH MINERALS) TABS Take 1 tablet by mouth daily.     Multiple Vitamins-Minerals (AIRBORNE GUMMIES PO) Take 2  each by mouth daily.     Omega-3 Fatty Acids (FISH OIL ULTRA) 1400 MG CAPS Take 1,400 mg by mouth daily.     Probiotic Product (PROBIOTIC FORMULA PO) Take 1 capsule by mouth daily.     rivaroxaban (XARELTO) 20 MG TABS tablet Take 1 tablet (20 mg total) by mouth daily with supper. 90 tablet 3   rosuvastatin (CRESTOR) 5 MG tablet Take 1 tablet (5 mg total) by mouth daily. 30 tablet 3   valACYclovir (VALTREX) 1000 MG tablet Take 1 tablet (1,000 mg total) by mouth daily as needed. 90 tablet 2   No current facility-administered medications for this visit.     Past Medical History:  Diagnosis Date   Arthritis    Chronic back pain    Diverticulitis    DVT (deep venous thrombosis) [facility]) age 58   s/p  knee arthroscopy    Herpes zoster conjunctivitis    Menopause    Mobitz type 2 second degree AV block    2:1/notes 02/16/2013   PONV (postoperative nausea and vomiting)    Presence of permanent cardiac pacemaker    Seasonal allergies    "spring & fall; not q year" (02/17/2013)   Shoulder impingement    and left rotator cuff tear    ROS:   All systems reviewed and negative except as noted in the  HPI.   Past Surgical History:  Procedure Laterality Date   ACHILLES TENDON SURGERY     ARTHROGRAM KNEE Right 1975   CHOLECYSTECTOMY  1990's   colonscopy     ESOPHAGOGASTRODUODENOSCOPY     EYE SURGERY     muscle release right eye   INSERT / REPLACE / REMOVE PACEMAKER  02/17/2013   [employer] Advantio dual-chamber pacemaker, model [id number],   KNEE ARTHROSCOPY Right 1982, 1983, 1995, 1996   KNEE ARTHROSCOPY W/ ACL RECONSTRUCTION Left 1984   LAPAROSCOPIC SIGMOID COLECTOMY N/A 01/01/2015   Procedure: LAPAROSCOPIC SIGMOID COLECTOMY;  Surgeon: Almond Lint, MD;  Location: [facility] OR;  Service: General;  Laterality: N/A;   LEAD EXTRACTION N/A 03/24/2022   Procedure: LEAD EXTRACTION;  Surgeon: [name], MD;  Location: [facility] INVASIVE CV LAB;  Service: Cardiovascular;  Laterality: N/A;   PACEMAKER  IMPLANT N/A 03/24/2022   Procedure: PACEMAKER IMPLANT;  Surgeon: [name], MD;  Location: [facility] INVASIVE CV LAB;  Service: Cardiovascular;  Laterality: N/A;   PERMANENT PACEMAKER INSERTION N/A 02/17/2013   Procedure: PERMANENT PACEMAKER INSERTION;  Surgeon: [name], MD;  Location: [facility] CATH LAB;  Service: Cardiovascular;  Laterality: N/A;   SHOULDER ARTHROSCOPY Left 09/05/2016   Procedure: LEFT SHOULDER ARTHROSCOPY , ACROMIOPLASTY AND ROTATOR CUFF REPAIR;  Surgeon: Marcene Corning, MD;  Location: [facility] OR;  Service: Orthopedics;  Laterality: Left;   TONSILLECTOMY  1961   TOTAL KNEE ARTHROPLASTY Right 2000; 06/2000   "replaced w/appropriate hardware" (02/17/2013)   TOTAL KNEE REVISION Right 04/13/2019   Procedure: Revision Right Knee Arthroplasty;  Surgeon: Gean Birchwood, MD;  Location: [facility] ORS;  Service: Orthopedics;  Laterality: Right;   TRIGGER FINGER RELEASE Right ~ 2012   "thumb" (02/17/2013)   VAGINAL HYSTERECTOMY  ~ 1994   partial      Family History  Problem Relation Age of Onset   Hypertension Mother    Hyperlipidemia Mother    Macular degeneration Mother    COPD Mother    Lung cancer Father    Cancer Father        Lung   COPD Father    Diabetes Brother    Breast cancer Paternal Aunt      Social History   Socioeconomic History   Marital status: Married    Spouse name: Not on file   Number of children: Not on file   Years of education: Not on file   Highest education level: Not on file  Occupational History   Occupation: Teacher, 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[employer]    Comment: [employer]  Tobacco Use   Smoking status: Never   Smokeless tobacco: Never  Vaping Use   Vaping status: Never Used  Substance and Sexual Activity   Alcohol use: Yes    Comment: occasional wine    Drug use: No   Sexual activity: Yes    Birth control/protection: Surgical    Comment: 02/17/2013 "I have a same sex partner"  Other Topics Concern   Not on file  Social History  Narrative   Not on file   Social Determinants of Health   Financial Resource Strain: Not on file  Food Insecurity: Not on file  Transportation Needs: Not on file  Physical Activity: Not on file  Stress: Not on file  Social Connections: Not on file  Intimate Partner Violence: Not on file     BP (!) 152/84   Pulse 71   Ht 5\' 8"  (1.727 m)  Wt 155 lb (70.3 kg)   SpO2 97%   BMI 23.57 kg/m   Physical Exam:  Well appearing NAD HEENT: Unremarkable Neck:  No JVD, no thyromegally Lymphatics:  No adenopathy Back:  No CVA tenderness Lungs:  Clear with no wheezes HEART:  Regular rate rhythm, no murmurs, no rubs, no clicks Abd:  soft, positive bowel sounds, no organomegally, no rebound, no guarding Ext:  2 plus pulses, no edema, no cyanosis, no clubbing Skin:  No rashes no nodules Neuro:  CN II through XII intact, motor grossly intact  EKG - NSR with ventricular pacing  DEVICE  Normal device function.  See PaceArt for details.   Assess/Plan:  CHB - she is asymptomatic s/p PPM insertion. No escape. PPM - her [employer] DDD PM is working normally. She will undergo recheck in several months. HTN - her bp is well controlled. No change in meds. Syncope - none since her PPM lead extraction and re-insertion. Left arm DVT - she will continue xarelto until the middle of September then is instructed to stop the xarelto.    Kari Gowda Makenzie Weisner,MD

## 2023-04-15 ENCOUNTER — Other Ambulatory Visit: Payer: Commercial Managed Care - PPO

## 2023-04-16 ENCOUNTER — Encounter: Payer: Self-pay | Admitting: Internal Medicine

## 2023-04-17 ENCOUNTER — Other Ambulatory Visit (INDEPENDENT_AMBULATORY_CARE_PROVIDER_SITE_OTHER): Payer: Commercial Managed Care - PPO

## 2023-04-17 DIAGNOSIS — Z0000 Encounter for general adult medical examination without abnormal findings: Secondary | ICD-10-CM | POA: Diagnosis not present

## 2023-04-17 LAB — LIPID PANEL
Cholesterol: 151 mg/dL (ref 0–200)
HDL: 52.3 mg/dL (ref >39.00)
LDL Cholesterol: 71 mg/dL (ref 0–99)
NonHDL: 99.09
Total CHOL/HDL Ratio: 3
Triglycerides: 138 mg/dL (ref 0.0–149.0)
VLDL: 27.6 mg/dL (ref 0.0–40.0)

## 2023-04-24 ENCOUNTER — Other Ambulatory Visit (HOSPITAL_COMMUNITY): Payer: Self-pay

## 2023-04-24 ENCOUNTER — Ambulatory Visit (INDEPENDENT_AMBULATORY_CARE_PROVIDER_SITE_OTHER): Payer: Commercial Managed Care - PPO | Admitting: Family Medicine

## 2023-04-24 ENCOUNTER — Other Ambulatory Visit: Payer: Self-pay | Admitting: Family Medicine

## 2023-04-24 ENCOUNTER — Encounter: Payer: Self-pay | Admitting: Family Medicine

## 2023-04-24 VITALS — BP 121/80 | HR 61 | Temp 98.4°F | Ht 68.0 in

## 2023-04-24 DIAGNOSIS — Z0000 Encounter for general adult medical examination without abnormal findings: Secondary | ICD-10-CM | POA: Diagnosis not present

## 2023-04-24 DIAGNOSIS — N3090 Cystitis, unspecified without hematuria: Secondary | ICD-10-CM | POA: Diagnosis not present

## 2023-04-24 MED ORDER — LEVOTHYROXINE SODIUM 25 MCG PO TABS: 25.0000 ug | ORAL_TABLET | Freq: Every day | ORAL | 3 refills | Status: DC

## 2023-04-24 MED ORDER — LEVOTHYROXINE SODIUM 25 MCG PO TABS
25.0000 ug | ORAL_TABLET | Freq: Every day | ORAL | 0 refills | Status: DC
  Filled 2023-04-24: qty 30, 30d supply, fill #0

## 2023-04-24 MED ORDER — NITROFURANTOIN MONOHYD MACRO 100 MG PO CAPS
100.0000 mg | ORAL_CAPSULE | Freq: Two times a day (BID) | ORAL | 0 refills | Status: AC
Start: 2023-04-24 — End: 2023-05-04

## 2023-04-24 MED ORDER — METHYLPREDNISOLONE 4 MG PO TBPK: ORAL_TABLET | ORAL | 0 refills | Status: DC

## 2023-04-24 MED ORDER — FLUCONAZOLE 150 MG PO TABS: ORAL_TABLET | ORAL | 0 refills | Status: DC

## 2023-04-24 NOTE — Patient Instructions (Signed)
Keep the diet clean and stay active.  I recommend getting the flu shot in mid October. This suggestion would change if the CDC comes out with a different recommendation.   Let us know if you need anything.

## 2023-04-24 NOTE — Progress Notes (Signed)
Chief Complaint  Patient presents with   Annual Exam    Low back pain      Well Woman [name] is here for a complete physical.   Her last physical was >1 year ago.  Current diet: in general, a "healthy" diet. Current exercise: lifting wts, active at work, on treadmill. Weight is stable and she denies daytime fatigue. Seatbelt? Yes Advanced directive? Yes  Health Maintenance Colonoscopy- Yes Shingrix- Yes DEXA- Yes Mammogram- Yes Tetanus- Yes Pneumonia- Yes Hep C screen- Yes  Past Medical History:  Diagnosis Date   Arthritis    Chronic back pain    Diverticulitis    DVT (deep venous thrombosis) [facility]) age 27   s/p  knee arthroscopy    Herpes zoster conjunctivitis    Menopause    Mobitz type 2 second degree AV block    2:1/notes 02/16/2013   PONV (postoperative nausea and vomiting)    Presence of permanent cardiac pacemaker    Seasonal allergies    "spring & fall; not q year" (02/17/2013)   Shoulder impingement    and left rotator cuff tear     Past Surgical History:  Procedure Laterality Date   ACHILLES TENDON SURGERY     ARTHROGRAM KNEE Right 1975   CHOLECYSTECTOMY  1990's   colonscopy     ESOPHAGOGASTRODUODENOSCOPY     EYE SURGERY     muscle release right eye   INSERT / REPLACE / REMOVE PACEMAKER  02/17/2013   [employer] Advantio dual-chamber pacemaker, model [id number],   KNEE ARTHROSCOPY Right 1982, 1983, 1995, 1996   KNEE ARTHROSCOPY W/ ACL RECONSTRUCTION Left 1984   LAPAROSCOPIC SIGMOID COLECTOMY N/A 01/01/2015   Procedure: LAPAROSCOPIC SIGMOID COLECTOMY;  Surgeon: Almond Lint, MD;  Location: [facility] OR;  Service: General;  Laterality: N/A;   LEAD EXTRACTION N/A 03/24/2022   Procedure: LEAD EXTRACTION;  Surgeon: [name], MD;  Location: [facility] INVASIVE CV LAB;  Service: Cardiovascular;  Laterality: N/A;   PACEMAKER IMPLANT N/A 03/24/2022   Procedure: PACEMAKER IMPLANT;  Surgeon: [name], MD;  Location: [facility] INVASIVE CV LAB;  Service:  Cardiovascular;  Laterality: N/A;   PERMANENT PACEMAKER INSERTION N/A 02/17/2013   Procedure: PERMANENT PACEMAKER INSERTION;  Surgeon: [name], MD;  Location: [facility] CATH LAB;  Service: Cardiovascular;  Laterality: N/A;   SHOULDER ARTHROSCOPY Left 09/05/2016   Procedure: LEFT SHOULDER ARTHROSCOPY , ACROMIOPLASTY AND ROTATOR CUFF REPAIR;  Surgeon: Marcene Corning, MD;  Location: [facility] OR;  Service: Orthopedics;  Laterality: Left;   TONSILLECTOMY  1961   TOTAL KNEE ARTHROPLASTY Right 2000; 06/2000   "replaced w/appropriate hardware" (02/17/2013)   TOTAL KNEE REVISION Right 04/13/2019   Procedure: Revision Right Knee Arthroplasty;  Surgeon: Gean Birchwood, MD;  Location: [facility] ORS;  Service: Orthopedics;  Laterality: Right;   TRIGGER FINGER RELEASE Right ~ 2012   "thumb" (02/17/2013)   VAGINAL HYSTERECTOMY  ~ 1994   partial     Medications  Current Outpatient Medications on File Prior to Visit  Medication Sig Dispense Refill   CALCIUM PO Take 1 tablet by mouth daily.     Cholecalciferol (VITAMIN D3) 125 MCG (5000 UT) CAPS Take 5,000 Units by mouth daily.     Coenzyme Q10 (COQ10) 200 MG CAPS Take by mouth daily at 6 (six) AM.     Cyanocobalamin (VITAMIN B-12) 5000 MCG SUBL Place under the tongue daily. 5000 I U daily per patient     diclofenac sodium (VOLTAREN) 1 % GEL Apply 1-2 g  topically 4 (four) times daily as needed (knee pain.).     famotidine (PEPCID) 40 MG tablet Take 40 mg by mouth every morning.     fluticasone (FLONASE) 50 MCG/ACT nasal spray Place 1 spray into both nostrils daily as needed for allergies or rhinitis.     metoprolol succinate (TOPROL-XL) 25 MG 24 hr tablet Take 1/2 tablet (12.5 mg total) by mouth daily. 45 tablet 3   Multiple Vitamin (MULTIVITAMIN WITH MINERALS) TABS Take 1 tablet by mouth daily.     Multiple Vitamins-Minerals (AIRBORNE GUMMIES PO) Take 2 each by mouth daily.     Omega-3 Fatty Acids (FISH OIL ULTRA) 1400 MG CAPS Take 1,400 mg by mouth daily.     Probiotic  Product (PROBIOTIC FORMULA PO) Take 1 capsule by mouth daily.     rosuvastatin (CRESTOR) 5 MG tablet Take 1 tablet (5 mg total) by mouth daily. 30 tablet 3   valACYclovir (VALTREX) 1000 MG tablet Take 1 tablet (1,000 mg total) by mouth daily as needed. 90 tablet 2   Allergies Allergies  Allergen Reactions   Eliquis [Apixaban] Palpitations    Diaphoresis, tachycardia   Bactrim [Sulfamethoxazole-Trimethoprim] Hives   Ceftin Itching    Tolerates amoxicillin   Vancomycin Hives   Levofloxacin Other (See Comments)    "spacey," tolerates Cipro    Review of Systems: Constitutional:  no fevers Eye:  no recent significant change in vision Ears:  No changes in hearing Nose/Mouth/Throat:  no complaints of nasal congestion, no sore throat Cardiovascular: no chest pain Respiratory:  No shortness of breath Gastrointestinal:  No change in bowel habits GU:  Female:+ dysuria Integumentary:  no abnormal skin lesions reported Neurologic:  no headaches Endocrine:  denies unexplained weight changes  Exam BP 121/80 (BP Location: Left Arm, Patient Position: Sitting, Cuff Size: Normal)   Pulse 61   Temp 98.4 F (36.9 C) (Oral)   Ht 5\' 8"  (1.727 m)   SpO2 95%   BMI 23.57 kg/m  General:  well developed, well nourished, in no apparent distress Skin:  no significant moles, warts, or growths Head:  no masses, lesions, or tenderness Eyes:  pupils equal and round, sclera anicteric without injection Ears:  canals without lesions, TMs shiny without retraction, no obvious effusion, no erythema Nose:  nares patent, mucosa normal, and no drainage Throat/Pharynx:  lips and gingiva without lesion; tongue and uvula midline; non-inflamed pharynx; no exudates or postnasal drainage Neck: neck supple without adenopathy, thyromegaly, or masses Lungs:  clear to auscultation, breath sounds equal bilaterally, no respiratory distress Cardio:  regular rate and rhythm, no bruits or LE edema Abdomen:  abdomen soft,  nontender; bowel sounds normal; no masses or organomegaly Genital: Deferred Neuro:  gait normal; deep tendon reflexes normal and symmetric Psych: well oriented with normal range of affect and appropriate judgment/insight  Assessment and Plan  Well adult exam  Cystitis - Plan: nitrofurantoin, macrocrystal-monohydrate, (MACROBID) 100 MG capsule, fluconazole (DIFLUCAN) 150 MG tablet   Well 72 y.o. female. Counseled on diet and exercise. 10 d of Macrobid as this duration typically works better for her. Diflucan should she need it.  Other orders as above. Follow up in 1 yr. The patient voiced understanding and agreement to the plan.  Jilda Roche [place], [place] 04/24/23 4:33 PM

## 2023-04-29 ENCOUNTER — Other Ambulatory Visit: Payer: Self-pay | Admitting: Family Medicine

## 2023-04-29 ENCOUNTER — Other Ambulatory Visit (HOSPITAL_COMMUNITY): Payer: Self-pay

## 2023-04-30 ENCOUNTER — Other Ambulatory Visit (HOSPITAL_COMMUNITY): Payer: Self-pay

## 2023-05-18 ENCOUNTER — Encounter: Payer: Self-pay | Admitting: Family Medicine

## 2023-05-19 ENCOUNTER — Other Ambulatory Visit: Payer: Self-pay | Admitting: Family Medicine

## 2023-05-19 ENCOUNTER — Other Ambulatory Visit (HOSPITAL_BASED_OUTPATIENT_CLINIC_OR_DEPARTMENT_OTHER): Payer: Self-pay | Admitting: Family Medicine

## 2023-05-19 ENCOUNTER — Other Ambulatory Visit (HOSPITAL_BASED_OUTPATIENT_CLINIC_OR_DEPARTMENT_OTHER): Payer: Self-pay

## 2023-05-19 DIAGNOSIS — Z1231 Encounter for screening mammogram for malignant neoplasm of breast: Secondary | ICD-10-CM

## 2023-05-19 MED ORDER — METHYLPREDNISOLONE 4 MG PO TBPK
ORAL_TABLET | ORAL | 0 refills | Status: DC
  Filled 2023-05-19 – 2023-05-22 (×2): qty 21, 6d supply, fill #0

## 2023-05-21 ENCOUNTER — Other Ambulatory Visit (HOSPITAL_COMMUNITY): Payer: Self-pay

## 2023-05-22 ENCOUNTER — Other Ambulatory Visit (HOSPITAL_BASED_OUTPATIENT_CLINIC_OR_DEPARTMENT_OTHER): Payer: Self-pay

## 2023-05-22 ENCOUNTER — Other Ambulatory Visit (HOSPITAL_COMMUNITY): Payer: Self-pay

## 2023-05-25 ENCOUNTER — Other Ambulatory Visit (HOSPITAL_COMMUNITY): Payer: Self-pay

## 2023-06-01 ENCOUNTER — Other Ambulatory Visit (HOSPITAL_COMMUNITY): Payer: Self-pay

## 2023-06-01 MED ORDER — LEVOTHYROXINE SODIUM 25 MCG PO TABS
25.0000 ug | ORAL_TABLET | Freq: Every day | ORAL | 3 refills | Status: DC
Start: 2023-04-24 — End: 2024-05-02
  Filled 2023-06-01: qty 90, 90d supply, fill #0
  Filled 2023-09-03: qty 90, 90d supply, fill #1
  Filled 2023-11-29: qty 90, 90d supply, fill #2
  Filled 2024-02-24: qty 60, 60d supply, fill #3
  Filled 2024-03-07: qty 30, 30d supply, fill #3
  Filled 2024-04-02 – 2024-04-05 (×2): qty 30, 30d supply, fill #4
  Filled 2024-04-06 (×2): qty 30, 30d supply, fill #0

## 2023-06-08 ENCOUNTER — Encounter (HOSPITAL_BASED_OUTPATIENT_CLINIC_OR_DEPARTMENT_OTHER): Payer: Self-pay | Admitting: Emergency Medicine

## 2023-06-08 ENCOUNTER — Emergency Department (HOSPITAL_BASED_OUTPATIENT_CLINIC_OR_DEPARTMENT_OTHER): Payer: Commercial Managed Care - PPO

## 2023-06-08 ENCOUNTER — Emergency Department (HOSPITAL_BASED_OUTPATIENT_CLINIC_OR_DEPARTMENT_OTHER)
Admission: EM | Admit: 2023-06-08 | Discharge: 2023-06-08 | Disposition: A | Discharge status: Home / Self Care | Payer: Commercial Managed Care - PPO | Attending: Emergency Medicine | Admitting: Emergency Medicine

## 2023-06-08 ENCOUNTER — Other Ambulatory Visit: Payer: Self-pay

## 2023-06-08 DIAGNOSIS — S0990XA Unspecified injury of head, initial encounter: Secondary | ICD-10-CM | POA: Insufficient documentation

## 2023-06-08 DIAGNOSIS — W19XXXA Unspecified fall, initial encounter: Secondary | ICD-10-CM | POA: Diagnosis not present

## 2023-06-08 DIAGNOSIS — Z79899 Other long term (current) drug therapy: Secondary | ICD-10-CM | POA: Insufficient documentation

## 2023-06-08 DIAGNOSIS — R519 Headache, unspecified: Secondary | ICD-10-CM | POA: Diagnosis not present

## 2023-06-08 DIAGNOSIS — S199XXA Unspecified injury of neck, initial encounter: Secondary | ICD-10-CM | POA: Diagnosis not present

## 2023-06-08 NOTE — ED Notes (Signed)
Pt. Reports a fall yesterday and hitting the back of her head with a poss. Bounce hitting the back of her head x 2.  Pt. Had no LOC and was aware of the fall with no vomiting last night and no nausea.    Pt. Here today for feeling a little different this morning and having some R visual blurriness.

## 2023-06-08 NOTE — ED Provider Notes (Signed)
[facility] Provider Note   CSN: [id number] Arrival date & time: 06/08/23  1437     History {Add pertinent medical, surgical, social history, OB history to HPI:1} Chief Complaint  Patient presents with   Head Injury    Lamar Blinks Cavazos is a 72 y.o. female who presents emergency department chief complaint of fall.  Patient states that she lost her footing and fell backward over a pale yesterday.  She states she skinned her right elbow and hit her head on the cement.  She states that her head bounced on the cement.  She did not have any immediate symptoms and did not have any severe headache confusion bleeding or hematoma.  This morning she woke up and had increased abnormal symptoms including some neck pain, blurry vision in her right eye, and just overall feeling "off."  Patient was recently on Xarelto was taken off 1 month ago.   Head Injury      Home Medications Prior to Admission medications   Medication Sig Start Date End Date Taking? Authorizing Provider  CALCIUM PO Take 1 tablet by mouth daily.    [provider]  Cholecalciferol (VITAMIN D3) 125 MCG (5000 UT) CAPS Take 5,000 Units by mouth daily.    [provider]  Coenzyme Q10 (COQ10) 200 MG CAPS Take by mouth daily at 6 (six) AM.    [provider]  Cyanocobalamin (VITAMIN B-12) 5000 MCG SUBL Place under the tongue daily. 5000 I U daily per patient    [provider]  diclofenac sodium (VOLTAREN) 1 % GEL Apply 1-2 g topically 4 (four) times daily as needed (knee pain.).    [provider]  famotidine (PEPCID) 40 MG tablet Take 40 mg by mouth every morning.    [provider]  fluconazole (DIFLUCAN) 150 MG tablet Take 1 tab, repeat in 72 hours if no improvement. 04/24/23   Sharlene Dory, DO  fluticasone (FLONASE) 50 MCG/ACT nasal spray Place 1 spray into both nostrils daily as needed for allergies or rhinitis.     [provider]  levothyroxine (SYNTHROID) 25 MCG tablet Take 1 tablet (25 mcg total) by mouth daily before breakfast. 04/24/23   Wendling, Jilda Roche, DO  levothyroxine (SYNTHROID) 25 MCG tablet Take 1 tablet (25 mcg total) by mouth daily before breakfast. 04/24/23     methylPREDNISolone (MEDROL DOSEPAK) 4 MG TBPK tablet Follow instructions on package. 05/19/23   Sharlene Dory, DO  metoprolol succinate (TOPROL-XL) 25 MG 24 hr tablet Take 1/2 tablet (12.5 mg total) by mouth daily. 12/31/22   [name], MD  Multiple Vitamin (MULTIVITAMIN WITH MINERALS) TABS Take 1 tablet by mouth daily.    [provider]  Multiple Vitamins-Minerals (AIRBORNE GUMMIES PO) Take 2 each by mouth daily.    [provider]  Omega-3 Fatty Acids (FISH OIL ULTRA) 1400 MG CAPS Take 1,400 mg by mouth daily.    [provider]  Probiotic Product (PROBIOTIC FORMULA PO) Take 1 capsule by mouth daily.    [provider]  rosuvastatin (CRESTOR) 5 MG tablet Take 1 tablet (5 mg total) by mouth daily. 04/08/23   Sharlene Dory, DO  valACYclovir (VALTREX) 1000 MG tablet Take 1 tablet (1,000 mg total) by mouth daily as needed. 08/05/22   Sharlene Dory, DO      Allergies    Eliquis [apixaban], Bactrim [sulfamethoxazole-trimethoprim], Ceftin, Vancomycin, and Levofloxacin    Review of Systems  Review of Systems  Physical Exam Updated Vital Signs BP (!) 157/82 (BP Location: Left Arm)   Pulse 64   Temp 97.7 F (36.5 C) (Oral)   Resp 18   Wt 70.3 kg   SpO2 97%   BMI 23.57 kg/m  Physical Exam Vitals and nursing note reviewed.  Constitutional:      General: She is not in acute distress.    Appearance: She is well-developed. She is not diaphoretic.  HENT:     Head: Normocephalic and atraumatic.     Right Ear: External ear normal.     Left Ear: External ear normal.     Nose: Nose normal.     Mouth/Throat:     Mouth: Mucous membranes are moist.   Eyes:     General: No scleral icterus.    Extraocular Movements: Extraocular movements intact.     Conjunctiva/sclera: Conjunctivae normal.     Pupils: Pupils are equal, round, and reactive to light.  Cardiovascular:     Rate and Rhythm: Normal rate and regular rhythm.     Heart sounds: Normal heart sounds. No murmur heard.    No friction rub. No gallop.  Pulmonary:     Effort: Pulmonary effort is normal. No respiratory distress.     Breath sounds: Normal breath sounds.  Abdominal:     General: Bowel sounds are normal. There is no distension.     Palpations: Abdomen is soft. There is no mass.     Tenderness: There is no abdominal tenderness. There is no guarding.  Musculoskeletal:     Cervical back: Normal range of motion.  Skin:    General: Skin is warm and dry.  Neurological:     General: No focal deficit present.     Mental Status: She is alert and oriented to person, place, and time.     Cranial Nerves: No cranial nerve deficit.     Sensory: No sensory deficit.     Motor: No weakness.     Gait: Gait normal.     Deep Tendon Reflexes: Reflexes normal.  Psychiatric:        Behavior: Behavior normal.     ED Results / Procedures / Treatments   Labs (all labs ordered are listed, but only abnormal results are displayed) Labs Reviewed - No data to display  EKG None  Radiology No results found.  Procedures Procedures  {Document cardiac monitor, telemetry assessment procedure when appropriate:1}  Medications Ordered in ED Medications - No data to display  ED Course/ Medical Decision Making/ A&P   {   Click here for ABCD2, HEART and other calculatorsREFRESH Note before signing :1}                              Medical Decision Making  ***  {Document critical care time when appropriate:1} {Document review of labs and clinical decision tools ie heart score, Chads2Vasc2 etc:1}  {Document your independent review of radiology images, and any outside  records:1} {Document your discussion with family members, caretakers, and with consultants:1} {Document social determinants of health affecting pt's care:1} {Document your decision making why or why not admission, treatments were needed:1} Final Clinical Impression(s) / ED Diagnoses Final diagnoses:  None    Rx / DC Orders ED Discharge Orders     None

## 2023-06-08 NOTE — Discharge Instructions (Signed)
Contact a health care provider if: You have headaches that do not go away. You have dizziness that does not go away. You have double vision or vision changes that do not go away. You have difficulty sleeping. You have changes in your mood. You have new symptoms. Get help right away if: You have sudden: Severe headache. Severe vomiting. Unequal pupil size. One is bigger than the other. Vision problems. Confusion or irritability. You have a seizure. Your symptoms get worse. You have clear or bloody fluid coming from your nose or ears. These symptoms may be an emergency. Get help right away. Call 911. Do not wait to see if the symptoms will go away. Do not drive yourself to the hospital.

## 2023-06-08 NOTE — ED Triage Notes (Signed)
Fell after tripped . yesterday at 4 pm , head injury  against the pavement . Woke up this morning shaky , headache , no NV ,  Was on Xoralto until 05/10/23. On 81 mg Aspirin daily .  Adds neck pain .  Alert and oriented x 4 .

## 2023-06-11 ENCOUNTER — Telehealth: Payer: Self-pay

## 2023-06-11 NOTE — Telephone Encounter (Signed)
...     Pre-operative Risk Assessment    Patient Name: Kari [name]  DOB: [birth year] MRN: [id number]      Request for Surgical Clearance    Procedure:   COLONSCOPY  Date of Surgery:  Clearance TBD                                 Surgeon:  DR Loreta Ave Surgeon's Group or Practice Name:  [facility] Phone number:  [phone] Fax number:  [phone]   Type of Clearance Requested:   - Medical    Type of Anesthesia:   PROPOFOL   Additional requests/questions:   LAST O/V 04/14/23, NO NEW APPT SCHEDULE  Signed, Renee Ramus   06/11/2023, 10:34 AM

## 2023-06-12 NOTE — Telephone Encounter (Signed)
Dr. Ladona Ridgel,  You saw this patient on 04/14/2023. Per office protocol, will you please comment on medical clearance for colonoscopy?  Please route your response to P CV DIV Preop. I will communicate with requesting office once you have given recommendations.   Thank you!  Carlos Levering, NP

## 2023-06-14 NOTE — Telephone Encounter (Signed)
Ok to proceed with colonoscopy. GT

## 2023-06-15 NOTE — Telephone Encounter (Signed)
     Primary Cardiologist: Lewayne Bunting, MD  Chart reviewed as part of pre-operative protocol coverage. Given past medical history and time since last visit, based on ACC/AHA guidelines, [place] would be at acceptable risk for the planned procedure without further cardiovascular testing.     I will route this recommendation to the requesting party via Epic fax function and remove from pre-op pool.  Please call with questions.  Thomasene Ripple. Alizah Sills NP-C     06/15/2023, 10:28 AM [facility] [address] Office [phone] Fax [phone]

## 2023-06-16 ENCOUNTER — Inpatient Hospital Stay (HOSPITAL_BASED_OUTPATIENT_CLINIC_OR_DEPARTMENT_OTHER): Admission: RE | Admit: 2023-06-16 | Payer: Commercial Managed Care - PPO | Source: Ambulatory Visit

## 2023-06-27 ENCOUNTER — Telehealth: Payer: Commercial Managed Care - PPO

## 2023-06-27 DIAGNOSIS — R399 Unspecified symptoms and signs involving the genitourinary system: Secondary | ICD-10-CM

## 2023-06-27 MED ORDER — NITROFURANTOIN MONOHYD MACRO 100 MG PO CAPS
100.0000 mg | ORAL_CAPSULE | Freq: Two times a day (BID) | ORAL | 0 refills | Status: AC
Start: 2023-06-27 — End: 2023-07-04

## 2023-06-27 NOTE — Progress Notes (Signed)
E-Visit for Urinary Problems  We are sorry that you are not feeling well.  Here is how we plan to help!  Based on what you shared with me it looks like you most likely have a simple urinary tract infection.  A UTI (Urinary Tract Infection) is a bacterial infection of the bladder.  Most cases of urinary tract infections are simple to treat but a key part of your care is to encourage you to drink plenty of fluids and watch your symptoms carefully.  I have prescribed MacroBid 100 mg twice a day for 7 days.  Your symptoms should gradually improve. Call us if the burning in your urine worsens, you develop worsening fever, back pain or pelvic pain or if your symptoms do not resolve after completing the antibiotic.  Urinary tract infections can be prevented by drinking plenty of water to keep your body hydrated.  Also be sure when you wipe, wipe from front to back and don't hold it in!  If possible, empty your bladder every 4 hours.  HOME CARE Drink plenty of fluids Compete the full course of the antibiotics even if the symptoms resolve Remember, when you need to go.go. Holding in your urine can increase the likelihood of getting a UTI! GET HELP RIGHT AWAY IF: You cannot urinate You get a high fever Worsening back pain occurs You see blood in your urine You feel sick to your stomach or throw up You feel like you are going to pass out  MAKE SURE YOU  Understand these instructions. Will watch your condition. Will get help right away if you are not doing well or get worse.   Thank you for choosing an e-visit.  Your e-visit answers were reviewed by a board certified advanced clinical practitioner to complete your personal care plan. Depending upon the condition, your plan could have included both over the counter or prescription medications.  Please review your pharmacy choice. Make sure the pharmacy is open so you can pick up prescription now. If there is a problem, you may contact your  provider through MyChart messaging and have the prescription routed to another pharmacy.  Your safety is important to us. If you have drug allergies check your prescription carefully.   For the next 24 hours you can use MyChart to ask questions about today's visit, request a non-urgent call back, or ask for a work or school excuse. You will get an email in the next two days asking about your experience. I hope that your e-visit has been valuable and will speed your recovery.   Approximately 5 minutes was spent documenting and reviewing patient's chart.   

## 2023-06-29 LAB — CUP PACEART REMOTE DEVICE CHECK
Battery Remaining Longevity: 138 mo
Battery Remaining Percentage: 100 %
Brady Statistic RA Percent Paced: 46 %
Brady Statistic RV Percent Paced: 100 %
Date Time Interrogation Session: 20241102073200
Implantable Lead Connection Status: 753985
Implantable Lead Connection Status: 753985
Implantable Lead Implant Date: 20230731
Implantable Lead Implant Date: 20230731
Implantable Lead Location: 753859
Implantable Lead Location: 753860
Implantable Lead Model: 7841
Implantable Lead Model: 7842
Implantable Lead Serial Number: 1215821
Implantable Lead Serial Number: 1304218
Implantable Pulse Generator Implant Date: 20230731
Lead Channel Impedance Value: 633 Ohm
Lead Channel Impedance Value: 792 Ohm
Lead Channel Setting Pacing Amplitude: 2 V
Lead Channel Setting Pacing Amplitude: 2.5 V
Lead Channel Setting Pacing Pulse Width: 0.6 ms
Lead Channel Setting Sensing Sensitivity: 4 mV
Pulse Gen Serial Number: 600527
Zone Setting Status: 755011

## 2023-07-06 ENCOUNTER — Other Ambulatory Visit (HOSPITAL_COMMUNITY): Payer: Self-pay

## 2023-07-10 ENCOUNTER — Encounter: Payer: Self-pay | Admitting: Family Medicine

## 2023-07-14 ENCOUNTER — Encounter (HOSPITAL_BASED_OUTPATIENT_CLINIC_OR_DEPARTMENT_OTHER): Payer: Self-pay

## 2023-07-14 ENCOUNTER — Ambulatory Visit (HOSPITAL_BASED_OUTPATIENT_CLINIC_OR_DEPARTMENT_OTHER)
Admission: RE | Admit: 2023-07-14 | Discharge: 2023-07-14 | Disposition: A | Discharge status: Home / Self Care | Payer: Commercial Managed Care - PPO | Source: Ambulatory Visit | Attending: Family Medicine | Admitting: Family Medicine

## 2023-07-14 DIAGNOSIS — Z1231 Encounter for screening mammogram for malignant neoplasm of breast: Secondary | ICD-10-CM | POA: Diagnosis not present

## 2023-07-22 ENCOUNTER — Telehealth: Payer: Commercial Managed Care - PPO | Admitting: Nurse Practitioner

## 2023-07-22 DIAGNOSIS — J0110 Acute frontal sinusitis, unspecified: Secondary | ICD-10-CM

## 2023-07-22 MED ORDER — DOXYCYCLINE HYCLATE 100 MG PO TABS
100.0000 mg | ORAL_TABLET | Freq: Two times a day (BID) | ORAL | 0 refills | Status: AC
Start: 2023-07-22 — End: 2023-07-29

## 2023-07-22 NOTE — Progress Notes (Signed)
E-Visit for Sinus Problems  We are sorry that you are not feeling well.  Here is how we plan to help!  Based on what you have shared with me it looks like you have sinusitis.  Sinusitis is inflammation and infection in the sinus cavities of the head.  Based on your presentation I believe you most likely have Acute Bacterial Sinusitis.  This is an infection caused by bacteria and is treated with antibiotics. I have prescribed Doxycycline 100mg  by mouth twice a day for 7 days. You may use an oral decongestant such as Mucinex D or if you have glaucoma or high blood pressure use plain Mucinex. Saline nasal spray help and can safely be used as often as needed for congestion.  If you develop worsening sinus pain, fever or notice severe headache and vision changes, or if symptoms are not better after completion of antibiotic, please schedule an appointment with a health care provider.    Sinus infections are not as easily transmitted as other respiratory infection, however we still recommend that you avoid close contact with loved ones, especially the very young and elderly.  Remember to wash your hands thoroughly throughout the day as this is the number one way to prevent the spread of infection!  Home Care: Only take medications as instructed by your medical team. Complete the entire course of an antibiotic. Do not take these medications with alcohol. A steam or ultrasonic humidifier can help congestion.  You can place a towel over your head and breathe in the steam from hot water coming from a faucet. Avoid close contacts especially the very young and the elderly. Cover your mouth when you cough or sneeze. Always remember to wash your hands.  Get Help Right Away If: You develop worsening fever or sinus pain. You develop a severe head ache or visual changes. Your symptoms persist after you have completed your treatment plan.  Make sure you Understand these instructions. Will watch your  condition. Will get help right away if you are not doing well or get worse.  Thank you for choosing an e-visit.  Your e-visit answers were reviewed by a board certified advanced clinical practitioner to complete your personal care plan. Depending upon the condition, your plan could have included both over the counter or prescription medications.  Please review your pharmacy choice. Make sure the pharmacy is open so you can pick up prescription now. If there is a problem, you may contact your provider through [employer] and have the prescription routed to another pharmacy.  Your safety is important to [place]. If you have drug allergies check your prescription carefully.   For the next 24 hours you can use MyChart to ask questions about today's visit, request a non-urgent call back, or ask for a work or school excuse. You will get an email in the next two days asking about your experience. I hope that your e-visit has been valuable and will speed your recovery.   Meds ordered this encounter  Medications   doxycycline (VIBRA-TABS) 100 MG tablet    Sig: Take 1 tablet (100 mg total) by mouth 2 (two) times daily for 7 days.    Dispense:  14 tablet    Refill:  0     I spent approximately 5 minutes reviewing the patient's history, current symptoms and coordinating their care today.

## 2023-07-29 ENCOUNTER — Other Ambulatory Visit (HOSPITAL_COMMUNITY): Payer: Self-pay

## 2023-07-29 MED ORDER — AMOXICILLIN 500 MG PO CAPS
500.0000 mg | ORAL_CAPSULE | Freq: Three times a day (TID) | ORAL | 0 refills | Status: DC
Start: 2023-06-16 — End: 2023-10-13
  Filled 2023-07-29: qty 21, 7d supply, fill #0

## 2023-07-31 ENCOUNTER — Encounter (HOSPITAL_BASED_OUTPATIENT_CLINIC_OR_DEPARTMENT_OTHER): Payer: Self-pay | Admitting: Emergency Medicine

## 2023-07-31 ENCOUNTER — Emergency Department (HOSPITAL_BASED_OUTPATIENT_CLINIC_OR_DEPARTMENT_OTHER)
Admission: EM | Admit: 2023-07-31 | Discharge: 2023-07-31 | Disposition: A | Discharge status: Home / Self Care | Payer: Commercial Managed Care - PPO | Attending: Emergency Medicine | Admitting: Emergency Medicine

## 2023-07-31 ENCOUNTER — Other Ambulatory Visit (HOSPITAL_BASED_OUTPATIENT_CLINIC_OR_DEPARTMENT_OTHER): Payer: Self-pay

## 2023-07-31 ENCOUNTER — Other Ambulatory Visit: Payer: Self-pay

## 2023-07-31 DIAGNOSIS — I10 Essential (primary) hypertension: Secondary | ICD-10-CM | POA: Insufficient documentation

## 2023-07-31 DIAGNOSIS — Z79899 Other long term (current) drug therapy: Secondary | ICD-10-CM | POA: Insufficient documentation

## 2023-07-31 DIAGNOSIS — T7840XA Allergy, unspecified, initial encounter: Secondary | ICD-10-CM | POA: Diagnosis not present

## 2023-07-31 MED ORDER — EPINEPHRINE 0.3 MG/0.3ML IJ SOAJ
0.3000 mg | INTRAMUSCULAR | 0 refills | Status: AC | PRN
  Filled 2023-07-31: qty 2, 30d supply, fill #0
  Filled 2023-09-04: qty 2, 2d supply, fill #0

## 2023-07-31 MED ORDER — METHYLPREDNISOLONE SODIUM SUCC 125 MG IJ SOLR
125.0000 mg | Freq: Once | INTRAMUSCULAR | Status: AC
  Administered 2023-07-31: 125 mg via INTRAVENOUS
  Filled 2023-07-31: qty 2

## 2023-07-31 MED ORDER — LIDOCAINE VISCOUS HCL 2 % MT SOLN
15.0000 mL | Freq: Once | OROMUCOSAL | Status: DC
  Filled 2023-07-31: qty 15

## 2023-07-31 MED ORDER — FAMOTIDINE IN NACL 20-0.9 MG/50ML-% IV SOLN
20.0000 mg | Freq: Once | INTRAVENOUS | Status: AC
  Administered 2023-07-31: 20 mg via INTRAVENOUS
  Filled 2023-07-31: qty 50

## 2023-07-31 NOTE — ED Notes (Signed)
Discharge paperwork given and verbally understood. 

## 2023-07-31 NOTE — Discharge Instructions (Signed)
As discussed, will send an EpiPen to use if concerns for anaphylactic reaction occur.  Recommend continue use of antihistamines at home as needed for treatment of symptoms.  Please do not hesitate to return if the worrisome signs and symptoms we discussed become apparent.

## 2023-07-31 NOTE — ED Notes (Signed)
Pt removed IV prior to this medic arriving with paperwork.

## 2023-07-31 NOTE — ED Notes (Addendum)
Pt given fluids.. No N/V and seemed to be able to keep it down... Provider informed.Marland KitchenMarland Kitchen

## 2023-07-31 NOTE — ED Triage Notes (Signed)
Pt c/o concern for allergic reaction to amoxicillin. Pt c/o difficulty swallowing this am. Took 20mg  pepcid, 25mg  benadryl

## 2023-07-31 NOTE — ED Notes (Signed)
Provider wanted to hold the lido until after about an hour from the steroid.

## 2023-07-31 NOTE — ED Notes (Signed)
Pt refused the Lido, Provider informed.Marland KitchenMarland Kitchen

## 2023-07-31 NOTE — ED Provider Notes (Signed)
[facility] EMERGENCY DEPARTMENT AT [facility] Provider Note   CSN: [id number] Arrival date & time: 07/31/23  8657     History  Chief Complaint  Patient presents with   Allergic Reaction    Kari [name] is a 72 y.o. female.   Allergic Reaction   72 year old female presents to the emergency department with concern for allergic reaction.  Patient states that she was placed on amoxicillin prophylactically after root canal was performed.  Root canal was performed last week and patient was subsequently diagnosed with frontal sinusitis on 07/22/2023 and placed on amoxicillin.  States that she has taken 4 doses of medication prior without any allergic type symptoms.  States that she got home from work earlier this morning after working a third shift and took amoxicillin when she ate.  When she laid down to sleep, felt like her throat was swelling and had difficulty swallowing.  Denies any difficulty breathing.  Denies any globus sensation.  Denies any rash, abdominal discomfort, nausea, vomiting, itching sensation, fever, chills.  Past medical history significant for DVT, chronic back pain, diverticulitis, movements to, seasonal allergies, anaphylaxis  Home Medications Prior to Admission medications   Medication Sig Start Date End Date Taking? Authorizing Provider  EPINEPHrine 0.3 mg/0.3 mL IJ SOAJ injection Inject 0.3 mg into the muscle as needed for anaphylaxis. 07/31/23  Yes Sherian Maroon A, PA  amoxicillin (AMOXIL) 500 MG capsule Take 1 capsule (500 mg total) by mouth 3 (three) times daily for 7 days 06/16/23     CALCIUM PO Take 1 tablet by mouth daily.    [provider]  Cholecalciferol (VITAMIN D3) 125 MCG (5000 UT) CAPS Take 5,000 Units by mouth daily.    [provider]  Coenzyme Q10 (COQ10) 200 MG CAPS Take by mouth daily at 6 (six) AM.    [provider]  Cyanocobalamin (VITAMIN B-12) 5000 MCG SUBL Place under the tongue daily. 5000 I U  daily per patient    [provider]  diclofenac sodium (VOLTAREN) 1 % GEL Apply 1-2 g topically 4 (four) times daily as needed (knee pain.).    [provider]  fluticasone (FLONASE) 50 MCG/ACT nasal spray Place 1 spray into both nostrils daily as needed for allergies or rhinitis.    [provider]  levothyroxine (SYNTHROID) 25 MCG tablet Take 1 tablet (25 mcg total) by mouth daily before breakfast. 04/24/23   Wendling, Jilda Roche, DO  levothyroxine (SYNTHROID) 25 MCG tablet Take 1 tablet (25 mcg total) by mouth daily before breakfast. 04/24/23     metoprolol succinate (TOPROL-XL) 25 MG 24 hr tablet Take 1/2 tablet (12.5 mg total) by mouth daily. 12/31/22   [name], MD  Multiple Vitamin (MULTIVITAMIN WITH MINERALS) TABS Take 1 tablet by mouth daily.    [provider]  Multiple Vitamins-Minerals (AIRBORNE GUMMIES PO) Take 2 each by mouth daily.    [provider]  Omega-3 Fatty Acids (FISH OIL ULTRA) 1400 MG CAPS Take 1,400 mg by mouth daily.    [provider]  Probiotic Product (PROBIOTIC FORMULA PO) Take 1 capsule by mouth daily.    [provider]  rosuvastatin (CRESTOR) 5 MG tablet Take 1 tablet (5 mg total) by mouth daily. 04/08/23   Sharlene Dory, DO  valACYclovir (VALTREX) 1000 MG tablet Take 1 tablet (1,000 mg total) by mouth daily as needed. 08/05/22   Sharlene Dory, DO      Allergies    Eliquis [apixaban], Bactrim [  sulfamethoxazole-trimethoprim], Ceftin, Vancomycin, Amoxicillin, and Levofloxacin    Review of Systems   Review of Systems  All other systems reviewed and are negative.   Physical Exam Updated Vital Signs BP (!) 179/74   Pulse 62   Resp 18   SpO2 98%  Physical Exam Vitals and nursing note reviewed.  Constitutional:      General: She is not in acute distress.    Appearance: She is well-developed.  HENT:     Head: Normocephalic and atraumatic.     Mouth/Throat:      Comments: No obvious sublingual extremity swelling.  Tonsils 1+ bilaterally.  Uvula midline rise symmetric with phonation.  No trismus. Eyes:     Conjunctiva/sclera: Conjunctivae normal.  Cardiovascular:     Rate and Rhythm: Normal rate and regular rhythm.  Pulmonary:     Effort: Pulmonary effort is normal. No respiratory distress.     Breath sounds: Normal breath sounds. No stridor. No wheezing, rhonchi or rales.  Abdominal:     Palpations: Abdomen is soft.     Tenderness: There is no abdominal tenderness. There is no guarding.  Musculoskeletal:        General: No swelling.     Cervical back: Neck supple.  Skin:    General: Skin is warm and dry.     Capillary Refill: Capillary refill takes less than 2 seconds.  Neurological:     Mental Status: She is alert.  Psychiatric:        Mood and Affect: Mood normal.     ED Results / Procedures / Treatments   Labs (all labs ordered are listed, but only abnormal results are displayed) Labs Reviewed - No data to display  EKG None  Radiology No results found.  Procedures Procedures    Medications Ordered in ED Medications  methylPREDNISolone sodium succinate (SOLU-MEDROL) 125 mg/2 mL injection 125 mg (125 mg Intravenous Given 07/31/23 1010)  famotidine (PEPCID) IVPB 20 mg premix (0 mg Intravenous Stopped 07/31/23 1104)    ED Course/ Medical Decision Making/ A&P Clinical Course as of 07/31/23 1149  Fri Jul 31, 2023  1144 Revaluation of the patient showed significant improvement of symptoms.  Patient tolerating p.o. without difficulty.  Will discharge. [CR]    Clinical Course User Index [CR] Peter Garter, PA                                 Medical Decision Making Risk Prescription drug management.   This patient presents to the ED for concern of difficulty swallowing/allergic reaction, this involves an extensive number of treatment options, and is a complaint that carries with it a high risk of complications and  morbidity.  The differential diagnosis includes anaphylaxis, angioedema, pill esophagitis, GERD, other   Co morbidities that complicate the patient evaluation  See HPI   Additional history obtained:  Additional history obtained from EMR External records from outside source obtained and reviewed including hospital   Lab Tests:  N/a   Imaging Studies ordered:  N/a   Cardiac Monitoring: / EKG:  The patient was maintained on a cardiac monitor.  I personally viewed and interpreted the cardiac monitored which showed an underlying rhythm of: sinus rhythm   Consultations Obtained:  N/a   Problem List / ED Course / Critical interventions / Medication management  Allergic reaction I ordered medication including Pepcid, Solu-Medrol   Reevaluation of the patient after these medicines showed that the  patient improved I have reviewed the patients home medicines and have made adjustments as needed   Social Determinants of Health:  Denies tobacco, licit drug use   Test / Admission - Considered:  Allergic reaction Vitals signs significant for hypertension. Otherwise within normal range and stable throughout visit. 72 year old female presents emergency department with concerns for allergic reaction.  Patient has been taking amoxicillin prophylactically for prevention of infection after root canal was performed.  States that after eating as well as taking antibiotic this morning, developed worsening throat swelling.  On exam, patient without appreciable rash, stridor.  Oropharynx unremarkable.  Treated with IV Solu-Medrol as well as Pepcid given the patient already took Benadryl and 1 dose of Pepcid in the outpatient setting.  Patient subsequently observed in the ED for 2+ hours with significant improvement of symptoms and subsequent tolerance of p.o.  Suspect that patient's symptoms could be secondary to pill esophagitis versus allergy etiology.  Will prescribe EpiPen if concern for  development of anaphylaxis in the outpatient setting.  Strict return precautions discussed at length.  Patient overall well-appearing, afebrile, tolerating p.o. without difficulty in no acute distress. Worrisome signs and symptoms were discussed with the patient, and the patient acknowledged understanding to return to the ED if noticed. Patient was stable upon discharge.          Final Clinical Impression(s) / ED Diagnoses Final diagnoses:  Allergic reaction, initial encounter    Rx / DC Orders ED Discharge Orders          Ordered    EPINEPHrine 0.3 mg/0.3 mL IJ SOAJ injection  As needed        07/31/23 1141              Peter Garter, [place] 07/31/23 1149    Melene Plan, DO 07/31/23 1421

## 2023-08-10 ENCOUNTER — Other Ambulatory Visit (HOSPITAL_BASED_OUTPATIENT_CLINIC_OR_DEPARTMENT_OTHER): Payer: Self-pay

## 2023-08-12 ENCOUNTER — Other Ambulatory Visit (HOSPITAL_COMMUNITY): Payer: Self-pay

## 2023-08-12 ENCOUNTER — Other Ambulatory Visit: Payer: Self-pay | Admitting: Family Medicine

## 2023-08-12 MED ORDER — ROSUVASTATIN CALCIUM 5 MG PO TABS
5.0000 mg | ORAL_TABLET | Freq: Every day | ORAL | 3 refills | Status: DC
  Filled 2023-08-12: qty 30, 30d supply, fill #0
  Filled 2023-09-18 – 2023-09-22 (×3): qty 30, 30d supply, fill #1
  Filled 2023-10-17: qty 30, 30d supply, fill #2

## 2023-08-13 ENCOUNTER — Other Ambulatory Visit (HOSPITAL_COMMUNITY): Payer: Self-pay

## 2023-08-23 ENCOUNTER — Telehealth: Payer: Commercial Managed Care - PPO | Admitting: Family Medicine

## 2023-08-23 DIAGNOSIS — R3989 Other symptoms and signs involving the genitourinary system: Secondary | ICD-10-CM

## 2023-08-23 MED ORDER — NITROFURANTOIN MONOHYD MACRO 100 MG PO CAPS: 100.0000 mg | ORAL_CAPSULE | Freq: Two times a day (BID) | ORAL | 0 refills | Status: AC

## 2023-08-23 NOTE — Progress Notes (Signed)
E-Visit for Urinary Problems  We are sorry that you are not feeling well.  Here is how we plan to help!  Based on what you shared with me it looks like you most likely have a simple urinary tract infection.  A UTI (Urinary Tract Infection) is a bacterial infection of the bladder.  Most cases of urinary tract infections are simple to treat but a key part of your care is to encourage you to drink plenty of fluids and watch your symptoms carefully.  I have prescribed macrobid for 10 days.   Your symptoms should gradually improve. Call us if the burning in your urine worsens, you develop worsening fever, back pain or pelvic pain or if your symptoms do not resolve after completing the antibiotic.  Urinary tract infections can be prevented by drinking plenty of water to keep your body hydrated.  Also be sure when you wipe, wipe from front to back and don't hold it in!  If possible, empty your bladder every 4 hours.  HOME CARE Drink plenty of fluids Compete the full course of the antibiotics even if the symptoms resolve Remember, when you need to gogo. Holding in your urine can increase the likelihood of getting a UTI! GET HELP RIGHT AWAY IF: You cannot urinate You get a high fever Worsening back pain occurs You see blood in your urine You feel sick to your stomach or throw up You feel like you are going to pass out  MAKE SURE YOU  Understand these instructions. Will watch your condition. Will get help right away if you are not doing well or get worse.   Thank you for choosing an e-visit.  Your e-visit answers were reviewed by a board certified advanced clinical practitioner to complete your personal care plan. Depending upon the condition, your plan could have included both over the counter or prescription medications.  Please review your pharmacy choice. Make sure the pharmacy is open so you can pick up prescription now. If there is a problem, you may contact your provider through  [employer] and have the prescription routed to another pharmacy.  Your safety is important to [place]. If you have drug allergies check your prescription carefully.   For the next 24 hours you can use MyChart to ask questions about today's visit, request a non-urgent call back, or ask for a work or school excuse. You will get an email in the next two days asking about your experience. I hope that your e-visit has been valuable and will speed your recovery.    have provided 5 minutes of non face to face time during this encounter for chart review and documentation.

## 2023-09-04 ENCOUNTER — Other Ambulatory Visit (HOSPITAL_COMMUNITY): Payer: Self-pay

## 2023-09-19 ENCOUNTER — Other Ambulatory Visit (HOSPITAL_BASED_OUTPATIENT_CLINIC_OR_DEPARTMENT_OTHER): Payer: Self-pay

## 2023-09-22 ENCOUNTER — Other Ambulatory Visit (HOSPITAL_COMMUNITY): Payer: Self-pay

## 2023-09-22 ENCOUNTER — Other Ambulatory Visit (HOSPITAL_BASED_OUTPATIENT_CLINIC_OR_DEPARTMENT_OTHER): Payer: Self-pay

## 2023-09-22 ENCOUNTER — Ambulatory Visit: Payer: Commercial Managed Care - PPO

## 2023-09-22 DIAGNOSIS — I442 Atrioventricular block, complete: Secondary | ICD-10-CM | POA: Diagnosis not present

## 2023-09-24 LAB — CUP PACEART REMOTE DEVICE CHECK
Battery Remaining Longevity: 138 mo
Battery Remaining Percentage: 100 %
Brady Statistic RA Percent Paced: 43 %
Brady Statistic RV Percent Paced: 100 %
Date Time Interrogation Session: 20250128083800
Implantable Lead Connection Status: 753985
Implantable Lead Connection Status: 753985
Implantable Lead Implant Date: 20230731
Implantable Lead Implant Date: 20230731
Implantable Lead Location: 753859
Implantable Lead Location: 753860
Implantable Lead Model: 7841
Implantable Lead Model: 7842
Implantable Lead Serial Number: 1215821
Implantable Lead Serial Number: 1304218
Implantable Pulse Generator Implant Date: 20230731
Lead Channel Impedance Value: 651 Ohm
Lead Channel Impedance Value: 881 Ohm
Lead Channel Setting Pacing Amplitude: 2 V
Lead Channel Setting Pacing Amplitude: 2.5 V
Lead Channel Setting Pacing Pulse Width: 0.6 ms
Lead Channel Setting Sensing Sensitivity: 4 mV
Pulse Gen Serial Number: 600527
Zone Setting Status: 755011

## 2023-09-25 DIAGNOSIS — M1612 Unilateral primary osteoarthritis, left hip: Secondary | ICD-10-CM | POA: Diagnosis not present

## 2023-09-28 ENCOUNTER — Encounter: Payer: Self-pay | Admitting: Internal Medicine

## 2023-10-09 ENCOUNTER — Other Ambulatory Visit (HOSPITAL_COMMUNITY): Payer: Self-pay

## 2023-10-09 ENCOUNTER — Telehealth: Payer: Self-pay

## 2023-10-09 ENCOUNTER — Telehealth: Payer: Self-pay | Admitting: *Deleted

## 2023-10-09 NOTE — Telephone Encounter (Signed)
Pt has been scheduled tele preop appt 10/26/23. Med rec and consent are done.     Patient Consent for Virtual Visit        Kari [name] has provided verbal consent on 10/09/2023 for a virtual visit (video or telephone).   CONSENT FOR VIRTUAL VISIT FOR:  [name]  By participating in this virtual visit I agree to the following:  I hereby voluntarily request, consent and authorize [facility] and its employed or contracted physicians, physician assistants, nurse practitioners or other licensed health care professionals (the Practitioner), to provide me with telemedicine health care services (the "Services") as deemed necessary by the treating Practitioner. I acknowledge and consent to receive the Services by the Practitioner via telemedicine. I understand that the telemedicine visit will involve communicating with the Practitioner through live audiovisual communication technology and the disclosure of certain medical information by electronic transmission. I acknowledge that I have been given the opportunity to request an in-person assessment or other available alternative prior to the telemedicine visit and am voluntarily participating in the telemedicine visit.  I understand that I have the right to withhold or withdraw my consent to the use of telemedicine in the course of my care at any time, without affecting my right to future care or treatment, and that the Practitioner or I may terminate the telemedicine visit at any time. I understand that I have the right to inspect all information obtained and/or recorded in the course of the telemedicine visit and may receive copies of available information for a reasonable fee.  I understand that some of the potential risks of receiving the Services via telemedicine include:  Delay or interruption in medical evaluation due to technological equipment failure or disruption; Information transmitted may not be sufficient (e.g. poor  resolution of images) to allow for appropriate medical decision making by the Practitioner; and/or  In rare instances, security protocols could fail, causing a breach of personal health information.  Furthermore, I acknowledge that it is my responsibility to provide information about my medical history, conditions and care that is complete and accurate to the best of my ability. I acknowledge that Practitioner's advice, recommendations, and/or decision may be based on factors not within their control, such as incomplete or inaccurate data provided by me or distortions of diagnostic images or specimens that may result from electronic transmissions. I understand that the practice of medicine is not an exact science and that Practitioner makes no warranties or guarantees regarding treatment outcomes. I acknowledge that a copy of this consent can be made available to me via my patient portal [facility] MyChart), or I can request a printed copy by calling the office of [facility].    I understand that my insurance will be billed for this visit.   I have read or had this consent read to me. I understand the contents of this consent, which adequately explains the benefits and risks of the Services being provided via telemedicine.  I have been provided ample opportunity to ask questions regarding this consent and the Services and have had my questions answered to my satisfaction. I give my informed consent for the services to be provided through the use of telemedicine in my medical care

## 2023-10-09 NOTE — Telephone Encounter (Signed)
   Name: Kari [name]  DOB: [birth year]  MRN: [id number]  Primary Cardiologist: Lewayne Bunting, MD   Preoperative team, please contact this patient and set up a phone call appointment for further preoperative risk assessment. Please obtain consent and complete medication review. Thank you for your help.  I confirm that guidance regarding antiplatelet and oral anticoagulation therapy has been completed and, if necessary, noted below.  N/A  I also confirmed the patient resides in the state of [place]. As per [facility] Medical Board telemedicine laws, the patient must reside in the state in which the provider is licensed.   Napoleon Form, Leodis Rains, NP 10/09/2023, 11:11 AM [facility]

## 2023-10-09 NOTE — Telephone Encounter (Signed)
...     Pre-operative Risk Assessment    Patient Name: Kari [name]  DOB: [birth year] MRN: [id number]   Date of last office visit: 04/14/23 Date of next office visit: NONE   Request for Surgical Clearance    Procedure:   LEFT TOTAL ANTERIO RHIP ARTHROPLASTY  Date of Surgery:  Clearance TBD                                Surgeon:  DR Marcene Corning Surgeon's Group or Practice Name:  [facility] Phone number:  [phone] Fax number:  [phone]   Type of Clearance Requested:   - Medical    Type of Anesthesia:  Spinal   Additional requests/questions:    SignedRenee Ramus   10/09/2023, 11:03 AM

## 2023-10-09 NOTE — Telephone Encounter (Signed)
Pt has been scheduled tele preop appt 10/26/23. Med rec and consent are done

## 2023-10-13 ENCOUNTER — Telehealth: Payer: Commercial Managed Care - PPO | Admitting: Physician Assistant

## 2023-10-13 DIAGNOSIS — B9689 Other specified bacterial agents as the cause of diseases classified elsewhere: Secondary | ICD-10-CM

## 2023-10-13 DIAGNOSIS — J208 Acute bronchitis due to other specified organisms: Secondary | ICD-10-CM | POA: Diagnosis not present

## 2023-10-13 MED ORDER — BENZONATATE 100 MG PO CAPS
100.0000 mg | ORAL_CAPSULE | Freq: Three times a day (TID) | ORAL | 0 refills | Status: DC | PRN
Start: 2023-10-13 — End: 2023-12-09

## 2023-10-13 MED ORDER — AZITHROMYCIN 250 MG PO TABS
ORAL_TABLET | ORAL | 0 refills | Status: AC
Start: 2023-10-13 — End: 2023-10-18

## 2023-10-13 MED ORDER — DOXYCYCLINE HYCLATE 100 MG PO TABS
100.0000 mg | ORAL_TABLET | Freq: Two times a day (BID) | ORAL | 0 refills | Status: DC
Start: 2023-10-13 — End: 2023-10-13

## 2023-10-13 NOTE — Progress Notes (Signed)

## 2023-10-13 NOTE — Addendum Note (Signed)
 Addended by: Waldon Merl on: 10/13/2023 12:38 PM   Modules accepted: Orders

## 2023-10-13 NOTE — Progress Notes (Signed)
 I have spent 5 minutes in review of e-visit questionnaire, review and updating patient chart, medical decision making and response to patient.   Piedad Climes, PA-C

## 2023-10-14 ENCOUNTER — Other Ambulatory Visit (HOSPITAL_BASED_OUTPATIENT_CLINIC_OR_DEPARTMENT_OTHER): Payer: Self-pay

## 2023-10-14 ENCOUNTER — Other Ambulatory Visit: Payer: Self-pay

## 2023-10-14 ENCOUNTER — Emergency Department (HOSPITAL_BASED_OUTPATIENT_CLINIC_OR_DEPARTMENT_OTHER)
Admission: EM | Admit: 2023-10-14 | Discharge: 2023-10-14 | Disposition: A | Discharge status: Home / Self Care | Payer: Commercial Managed Care - PPO | Attending: Emergency Medicine | Admitting: Emergency Medicine

## 2023-10-14 ENCOUNTER — Encounter (HOSPITAL_BASED_OUTPATIENT_CLINIC_OR_DEPARTMENT_OTHER): Payer: Self-pay

## 2023-10-14 DIAGNOSIS — R9431 Abnormal electrocardiogram [ECG] [EKG]: Secondary | ICD-10-CM | POA: Diagnosis not present

## 2023-10-14 DIAGNOSIS — Z79899 Other long term (current) drug therapy: Secondary | ICD-10-CM | POA: Diagnosis not present

## 2023-10-14 DIAGNOSIS — J101 Influenza due to other identified influenza virus with other respiratory manifestations: Secondary | ICD-10-CM | POA: Diagnosis not present

## 2023-10-14 DIAGNOSIS — E039 Hypothyroidism, unspecified: Secondary | ICD-10-CM | POA: Diagnosis not present

## 2023-10-14 LAB — URINALYSIS, ROUTINE W REFLEX MICROSCOPIC
Bacteria, UA: NONE SEEN
Bilirubin Urine: NEGATIVE
Glucose, UA: NEGATIVE mg/dL
Ketones, ur: NEGATIVE mg/dL
Nitrite: NEGATIVE
Protein, ur: NEGATIVE mg/dL
RBC / HPF: >50 RBC/hpf (ref 0–5)
Specific Gravity, Urine: 1.018 (ref 1.005–1.030)
WBC, UA: >50 WBC/hpf (ref 0–5)
pH: 5 (ref 5.0–8.0)

## 2023-10-14 LAB — CBC WITH DIFFERENTIAL/PLATELET
Abs Immature Granulocytes: 0.01 10*3/uL (ref 0.00–0.07)
Basophils Absolute: 0 10*3/uL (ref 0.0–0.1)
Basophils Relative: 0 %
Eosinophils Absolute: 0.1 10*3/uL (ref 0.0–0.5)
Eosinophils Relative: 1 %
HCT: 39.3 % (ref 36.0–46.0)
Hemoglobin: 13.1 g/dL (ref 12.0–15.0)
Immature Granulocytes: 0 %
Lymphocytes Relative: 18 %
Lymphs Abs: 1.2 10*3/uL (ref 0.7–4.0)
MCH: 31.3 pg (ref 26.0–34.0)
MCHC: 33.3 g/dL (ref 30.0–36.0)
MCV: 93.8 fL (ref 80.0–100.0)
Monocytes Absolute: 0.4 10*3/uL (ref 0.1–1.0)
Monocytes Relative: 6 %
Neutro Abs: 4.8 10*3/uL (ref 1.7–7.7)
Neutrophils Relative %: 75 %
Platelets: 218 10*3/uL (ref 150–400)
RBC: 4.19 MIL/uL (ref 3.87–5.11)
RDW: 13.3 % (ref 11.5–15.5)
WBC: 6.5 10*3/uL (ref 4.0–10.5)
nRBC: 0 % (ref 0.0–0.2)

## 2023-10-14 LAB — BASIC METABOLIC PANEL
Anion gap: 8 (ref 5–15)
BUN: 13 mg/dL (ref 8–23)
CO2: 25 mmol/L (ref 22–32)
Calcium: 9.3 mg/dL (ref 8.9–10.3)
Chloride: 99 mmol/L (ref 98–111)
Creatinine, Ser: 0.7 mg/dL (ref 0.44–1.00)
GFR, Estimated: >60 mL/min (ref >60)
Glucose, Bld: 104 mg/dL — ABNORMAL HIGH (ref 70–99)
Potassium: 4.1 mmol/L (ref 3.5–5.1)
Sodium: 132 mmol/L — ABNORMAL LOW (ref 135–145)

## 2023-10-14 LAB — RESP PANEL BY RT-PCR (RSV, FLU A&B, COVID)  RVPGX2
Influenza A by PCR: POSITIVE — AB
Influenza B by PCR: NEGATIVE
Resp Syncytial Virus by PCR: NEGATIVE
SARS Coronavirus 2 by RT PCR: NEGATIVE

## 2023-10-14 LAB — TSH: TSH: 2.173 u[IU]/mL (ref 0.350–4.500)

## 2023-10-14 MED ORDER — SODIUM CHLORIDE 0.9 % IV BOLUS
1000.0000 mL | Freq: Once | INTRAVENOUS | Status: DC
  Administered 2023-10-14: 1000 mL via INTRAVENOUS

## 2023-10-14 MED ORDER — CIPROFLOXACIN HCL 500 MG PO TABS
500.0000 mg | ORAL_TABLET | Freq: Two times a day (BID) | ORAL | 0 refills | Status: AC
  Filled 2023-10-14: qty 10, 5d supply, fill #0

## 2023-10-14 NOTE — ED Provider Notes (Signed)
 [place] EMERGENCY DEPARTMENT AT [facility] Provider Note   CSN: [id number] Arrival date & time: 10/14/23  1040     History  Chief Complaint  Patient presents with   Allergic Reaction    Kari [name] is a 73 y.o. female.  73 year old female is here today for "not feeling right."  Patient reports URI type symptoms that began 4 days ago.  She did a telehealth visit and was prescribed azithromycin.  She says she woke up this morning, and felt as though she had a UTI.  She started taking Macrobid.  She says she began to feel a "sensation of euphoria", and felt as though "something was off."  Patient describes these symptoms as feeling like a wave of hot and cold that goes down her entire body, like "every neuron is firing."  She denies any pain in the chest, shortness of breath, abdominal pain.  No difficulty walking.   Allergic Reaction      Home Medications Prior to Admission medications   Medication Sig Start Date End Date Taking? Authorizing Provider  azithromycin (ZITHROMAX) 250 MG tablet Take 2 tablets on day 1, then 1 tablet daily on days 2 through 5 10/13/23 10/18/23  Waldon Merl, PA-C  benzonatate (TESSALON) 100 MG capsule Take 1 capsule (100 mg total) by mouth 3 (three) times daily as needed for cough. 10/13/23   Waldon Merl, PA-C  CALCIUM PO Take 1 tablet by mouth daily.    [provider]  Cholecalciferol (VITAMIN D3) 125 MCG (5000 UT) CAPS Take 5,000 Units by mouth daily.    [provider]  Coenzyme Q10 (COQ10) 200 MG CAPS Take by mouth daily at 6 (six) AM.    [provider]  Cyanocobalamin (VITAMIN B-12) 5000 MCG SUBL Place under the tongue daily. 5000 I U daily per patient    [provider]  diclofenac sodium (VOLTAREN) 1 % GEL Apply 1-2 g topically 4 (four) times daily as needed (knee pain.).    [provider]  EPINEPHrine 0.3 mg/0.3 mL IJ SOAJ injection Inject 0.3 mg into the muscle as  needed for anaphylaxis. 07/31/23   Peter Garter, PA  fluticasone (FLONASE) 50 MCG/ACT nasal spray Place 1 spray into both nostrils daily as needed for allergies or rhinitis.    [provider]  levothyroxine (SYNTHROID) 25 MCG tablet Take 1 tablet (25 mcg total) by mouth daily before breakfast. 04/24/23   Wendling, Jilda Roche, DO  levothyroxine (SYNTHROID) 25 MCG tablet Take 1 tablet (25 mcg total) by mouth daily before breakfast. 04/24/23     metoprolol succinate (TOPROL-XL) 25 MG 24 hr tablet Take 1/2 tablet (12.5 mg total) by mouth daily. 12/31/22   [name], MD  Multiple Vitamin (MULTIVITAMIN WITH MINERALS) TABS Take 1 tablet by mouth daily.    [provider]  Multiple Vitamins-Minerals (AIRBORNE GUMMIES PO) Take 2 each by mouth daily.    [provider]  Omega-3 Fatty Acids (FISH OIL ULTRA) 1400 MG CAPS Take 1,400 mg by mouth daily.    [provider]  Probiotic Product (PROBIOTIC FORMULA PO) Take 1 capsule by mouth daily.    [provider]  rosuvastatin (CRESTOR) 5 MG tablet Take 1 tablet (5 mg total) by mouth daily. 08/12/23   Sharlene Dory, DO  valACYclovir (VALTREX) 1000 MG tablet Take 1 tablet (1,000 mg total) by mouth daily as needed. 08/05/22   Sharlene Dory, DO      Allergies  Eliquis [apixaban], Bactrim [sulfamethoxazole-trimethoprim], Ceftin, Vancomycin, Amoxicillin, Doxycycline, and Levofloxacin    Review of Systems   Review of Systems  Physical Exam Updated Vital Signs BP (!) 170/78 (BP Location: Left Arm)   Pulse 60   Temp (!) 97.5 F (36.4 C) (Oral)   Resp 13   Ht 5\' 8"  (1.727 m)   Wt 68 kg   SpO2 97%   BMI 22.81 kg/m  Physical Exam Vitals reviewed.  Constitutional:      General: She is not in acute distress.    Appearance: She is not toxic-appearing.  Cardiovascular:     Rate and Rhythm: Normal rate.  Pulmonary:     Effort: Pulmonary effort is normal.     Breath sounds: Normal  breath sounds.  Musculoskeletal:        General: Normal range of motion.  Skin:    General: Skin is warm and dry.  Neurological:     Mental Status: She is alert.     ED Results / Procedures / Treatments   Labs (all labs ordered are listed, but only abnormal results are displayed) Labs Reviewed  RESP PANEL BY RT-PCR (RSV, FLU A&B, COVID)  RVPGX2 - Abnormal; Notable for the following components:      Result Value   Influenza A by PCR POSITIVE (*)    All other components within normal limits  URINALYSIS, ROUTINE W REFLEX MICROSCOPIC - Abnormal; Notable for the following components:   APPearance HAZY (*)    Hgb urine dipstick MODERATE (*)    Leukocytes,Ua MODERATE (*)    All other components within normal limits  BASIC METABOLIC PANEL - Abnormal; Notable for the following components:   Sodium 132 (*)    Glucose, Bld 104 (*)    All other components within normal limits  URINE CULTURE  CBC WITH DIFFERENTIAL/PLATELET  TSH    EKG None  Radiology No results found.  Procedures Procedures    Medications Ordered in ED Medications - No data to display  ED Course/ Medical Decision Making/ A&P                                 Medical Decision Making 73 year old female here today for feeling unwell.  Differential diagnoses include viral syndrome, cystitis, less likely arrhythmia, less likely hyperthyroid.  Plan # with the patient's past medical history of pacemaker placement, hypothyroid, will obtain TSH, EKG.  Basic labs including flu panel ordered.  Patient with a overall reassuring exam.  Mildly tachycardic, but normal pulse, afebrile, clear lung sounds, normal heart sounds.  Patient ambulated into the emergency department without any assistance.  Reassessment 12:10 PM-patient is flu positive.  Mildly hyponatremic.  I am giving her some normal saline and oral Pedialyte to offset the isotonic fluid.  Discussed patient's findings.  She is agreeable with the plan.  Amount  and/or Complexity of Data Reviewed Labs: ordered.           Final Clinical Impression(s) / ED Diagnoses Final diagnoses:  Influenza A    Rx / DC Orders ED Discharge Orders     None         Anders Simmonds T, DO 10/14/23 1231

## 2023-10-14 NOTE — ED Triage Notes (Signed)
 Pt POV from home, c/o possible allergic reaction. Currently on Macrobid and Zithromax for UTI. C/o inside mouth and both arms "feeling weird". No rash or itching, no ShOB, no CP, "just dont feel right"

## 2023-10-14 NOTE — Discharge Instructions (Addendum)
 You tested positive for the flu.  This is likely contributing to your symptoms.  Your sodium was a little bit low at 132.  Try to drink 1 Gatorade tomorrow, that should take care of that.  Your urine showed a questionable infection, but with your symptoms I think it is reasonable take Macrobid.  You will be called if your urine culture is positive.  I did send a prescription for ciprofloxacin to your pharmacy.  If you feel as though your urinary symptoms are improving on Macrobid, you can begin taking this.  Like we discussed, there are some risks with these medications such as tendon rupture, however the risk decreases if you avoid strenuous activity while taking those medicines.   Your thyroid function was normal.  Your EKG was normal.  I do recommend taking Tylenol, 650-1000mg  every 8 hours over the next couple of days while you are getting over the flu.  You can stop taking the azithromycin.  Follow-up with your PCP within 1 week if you continue to have symptoms.

## 2023-10-14 NOTE — ED Notes (Signed)
 Pt given Gatorlyte solution for oral rehydration

## 2023-10-15 LAB — URINE CULTURE: Culture: NO GROWTH

## 2023-10-16 ENCOUNTER — Telehealth: Payer: Self-pay

## 2023-10-16 NOTE — Telephone Encounter (Signed)
 Called pt was advised she will need appt for Pre-op  And appt schedule.

## 2023-10-20 ENCOUNTER — Encounter: Payer: Self-pay | Admitting: Internal Medicine

## 2023-10-21 ENCOUNTER — Ambulatory Visit: Payer: Commercial Managed Care - PPO | Admitting: Family Medicine

## 2023-10-24 ENCOUNTER — Other Ambulatory Visit (HOSPITAL_BASED_OUTPATIENT_CLINIC_OR_DEPARTMENT_OTHER): Payer: Self-pay

## 2023-10-26 ENCOUNTER — Ambulatory Visit: Payer: Commercial Managed Care - PPO | Admitting: Family Medicine

## 2023-10-26 ENCOUNTER — Encounter: Payer: Self-pay | Admitting: Family Medicine

## 2023-10-26 ENCOUNTER — Other Ambulatory Visit (HOSPITAL_COMMUNITY): Payer: Self-pay

## 2023-10-26 ENCOUNTER — Ambulatory Visit: Payer: Commercial Managed Care - PPO | Attending: Cardiology | Admitting: Nurse Practitioner

## 2023-10-26 VITALS — BP 130/82 | HR 61

## 2023-10-26 DIAGNOSIS — Z01810 Encounter for preprocedural cardiovascular examination: Secondary | ICD-10-CM | POA: Diagnosis not present

## 2023-10-26 DIAGNOSIS — Z01818 Encounter for other preprocedural examination: Secondary | ICD-10-CM | POA: Diagnosis not present

## 2023-10-26 MED ORDER — ROSUVASTATIN CALCIUM 5 MG PO TABS
5.0000 mg | ORAL_TABLET | Freq: Every day | ORAL | 3 refills | Status: DC
  Filled 2023-10-26: qty 100, 100d supply, fill #0
  Filled 2023-11-17: qty 90, 90d supply, fill #0

## 2023-10-26 NOTE — Progress Notes (Signed)
 Subjective:  CC: Pre-op  [place]  is here for a Pre-operative physical at the request of Dr. Jerl Santos.   She  is having L hip surgery L hip OA.  Personal or family hx of adverse outcome to anesthesia? No  Chipped, cracked, missing, or loose teeth? No  Decreased ROM of neck? No  Able to walk up 2 flights of stairs without becoming significantly short of breath or having chest pain? 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   Aortic atherosclerosis (HCC) 03/07/2022   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   Painful total knee replacement, right (HCC) 04/12/2019   Near syncope 03/11/2013   Bradycardia 02/21/2013   Pacemaker 02/20/2013   Chest tightness 02/19/2013   Palpitations 02/19/2013   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   Diverticulitis 03/03/2012   Chronic low back pain 08/28/2011   GERD (gastroesophageal reflux disease)    Herpes zoster conjunctivitis    Menopause    Arthritis    Diverticulosis    Allergy    Past Medical History:  Diagnosis Date   Arthritis    Chronic back pain    Diverticulitis    DVT (deep venous thrombosis) [facility]) age 11   s/p  knee arthroscopy    Herpes zoster conjunctivitis    Menopause    Mobitz type 2 second degree AV block    2:1/notes 02/16/2013   PONV (postoperative nausea and vomiting)    Presence of permanent cardiac pacemaker    Seasonal allergies    "spring & fall; not q  year" (02/17/2013)   Shoulder impingement    and left rotator cuff tear    Past Surgical History:  Procedure Laterality Date   ACHILLES TENDON SURGERY     ARTHROGRAM KNEE Right 1975   CHOLECYSTECTOMY  1990's   colonscopy     ESOPHAGOGASTRODUODENOSCOPY     EYE SURGERY     muscle release right eye   INSERT / REPLACE / REMOVE PACEMAKER  02/17/2013   [employer] Advantio dual-chamber pacemaker, model [id number],   KNEE ARTHROSCOPY Right 1982, 1983, 1995, 1996   KNEE ARTHROSCOPY W/ ACL RECONSTRUCTION Left 1984   LAPAROSCOPIC SIGMOID COLECTOMY N/A 01/01/2015   Procedure: LAPAROSCOPIC SIGMOID COLECTOMY;  Surgeon: Almond Lint, MD;  Location: [facility] OR;  Service: General;  Laterality: N/A;   LEAD EXTRACTION N/A 03/24/2022   Procedure: LEAD EXTRACTION;  Surgeon: [name], MD;  [facility]: [facility] INVASIVE CV LAB;  Service: Cardiovascular;  Laterality: N/A;   PACEMAKER IMPLANT N/A 03/24/2022   Procedure: PACEMAKER IMPLANT;  Surgeon: [name], MD;  [facility]: [facility] INVAS[facility] CV LAB;  Service: Cardiovascular;  Laterality: N/A;   PERMANENT PACEMAKER INSERTION N/A 02/17/2013   Procedure: PERMANENT PACEMAKER INSERTION;  Surgeon: [name], MD;  Location: [facility] CATH LAB;  Service: Cardiovascular;  Laterality: N/A;   SHOULDER ARTHROSCOPY Left 09/05/2016   Procedure: LEFT SHOULDER ARTHROSCOPY , ACROMIOPLASTY AND ROTATOR CUFF REPAIR;  Surgeon: Marcene Corning,  MD;  Location: [facility] OR;  Service: Orthopedics;  Laterality: Left;   TONSILLECTOMY  1961   TOTAL KNEE ARTHROPLASTY Right 2000; 06/2000   "replaced w/appropriate hardware" (02/17/2013)   TOTAL KNEE REVISION Right 04/13/2019   Procedure: Revision Right Knee Arthroplasty;  Surgeon: Gean Birchwood, MD;  Location: [facility] ORS;  Service: Orthopedics;  Laterality: Right;   TRIGGER FINGER RELEASE Right ~ 2012   "thumb" (02/17/2013)   VAGINAL HYSTERECTOMY  ~ 1994   partial     Current Outpatient Medications  Medication Sig Dispense Refill   benzonatate (TESSALON) 100  MG capsule Take 1 capsule (100 mg total) by mouth 3 (three) times daily as needed for cough. 30 capsule 0   CALCIUM PO Take 1 tablet by mouth daily.     Cholecalciferol (VITAMIN D3) 125 MCG (5000 UT) CAPS Take 5,000 Units by mouth daily.     Coenzyme Q10 (COQ10) 200 MG CAPS Take by mouth daily at 6 (six) AM.     Cyanocobalamin (VITAMIN B-12) 5000 MCG SUBL Place under the tongue daily. 5000 I U daily per patient     diclofenac sodium (VOLTAREN) 1 % GEL Apply 1-2 g topically 4 (four) times daily as needed (knee pain.).     EPINEPHrine 0.3 mg/0.3 mL IJ SOAJ injection Inject 0.3 mg into the muscle as needed for anaphylaxis. 2 each 0   fluticasone (FLONASE) 50 MCG/ACT nasal spray Place 1 spray into both nostrils daily as needed for allergies or rhinitis.     levothyroxine (SYNTHROID) 25 MCG tablet Take 1 tablet (25 mcg total) by mouth daily before breakfast. 90 tablet 3   levothyroxine (SYNTHROID) 25 MCG tablet Take 1 tablet (25 mcg total) by mouth daily before breakfast. 90 tablet 3   metoprolol succinate (TOPROL-XL) 25 MG 24 hr tablet Take 1/2 tablet (12.5 mg total) by mouth daily. 45 tablet 3   Multiple Vitamin (MULTIVITAMIN WITH MINERALS) TABS Take 1 tablet by mouth daily.     Multiple Vitamins-Minerals (AIRBORNE GUMMIES PO) Take 2 each by mouth daily.     Omega-3 Fatty Acids (FISH OIL ULTRA) 1400 MG CAPS Take 1,400 mg by mouth daily.     Probiotic Product (PROBIOTIC FORMULA PO) Take 1 capsule by mouth daily.     rosuvastatin (CRESTOR) 5 MG tablet Take 1 tablet (5 mg total) by mouth daily. 100 tablet 3   valACYclovir (VALTREX) 1000 MG tablet Take 1 tablet (1,000 mg total) by mouth daily as needed. 90 tablet 2   Allergies  Allergen Reactions   Eliquis [Apixaban] Palpitations    Diaphoresis, tachycardia   Bactrim [Sulfamethoxazole-Trimethoprim] Hives   Ceftin Itching    Tolerates amoxicillin   Vancomycin Hives   Amoxicillin Cough    Difficulty swallowing   Doxycycline     Difficulty  swallowing.    Levofloxacin Other (See Comments)    "spacey," tolerates Cipro    Family History  Problem Relation Age of Onset   Hypertension Mother    Hyperlipidemia Mother    Macular degeneration Mother    COPD Mother    Lung cancer Father    Cancer Father        Lung   COPD Father    Diabetes Brother    Breast cancer Paternal Aunt      Review of Systems:  Constitutional:  no fevers Eye:  no recent significant change in vision Ear:  no hearing loss Nose/Mouth/Throat:  No dental complaints Neck/Thyroid:  no lumps or masses Pulmonary:  No shortness of breath  Cardiovascular:  no chest pain Gastrointestinal:  no abdominal pain GU:  negative for dysuria Musculoskeletal/Extremities:  +L hip pain Skin/Integumentary ROS:  no abnormal skin lesions reported Neurologic:  no HA   Objective:   Vitals:   10/26/23 1516  BP: 130/82  Pulse: 61  SpO2: 98%   There is no height or weight on file to calculate BMI.  General:  well developed, well nourished, in no apparent distress Skin:  warm, no pallor or diaphoresis Head:  normocephalic, atraumatic Eyes:  pupils equal and round, sclera anicteric without injection Ears:  canals without lesions, TMs shiny without retraction, no obvious effusion, no erythema Throat/Pharynx:  lips and gingiva without lesion; tongue and uvula midline; non-inflamed pharynx; no exudates or postnasal drainage Neck: neck supple without adenopathy, thyromegaly, or masses, no bruits, no jugular venous distention Lungs:  clear to auscultation, breath sounds equal bilaterally, no respiratory distress Cardio:  regular rate and rhythm without murmurs Abdomen:  abdomen soft, nontender; bowel sounds normal; no masses, hepatomegaly or splenomegaly Musculoskeletal:  symmetrical muscle groups noted without atrophy or deformity Extremities:  no clubbing, cyanosis, or edema, no deformities, no skin discoloration Neuro:  gait antalgic; deep tendon reflexes normal and  symmetric and alert and oriented to person, place, and time Psych: Age appropriate judgment and insight; normal mood   Assessment:   Pre-op exam   Plan:   She has already been cleared by cards.  She is optimized from a medicine standpoint. No pharm adjustments pre-surgery. The patient is deemed low risk for the proposed procedure.  The patient voiced understanding and agreement to the plan.  Jilda Roche [place], DO 10/26/23  4:31 PM

## 2023-10-26 NOTE — Progress Notes (Signed)
 Virtual Visit via Telephone Note   Because of Kari [name] co-morbid illnesses, she is at least at moderate risk for complications without adequate follow up.  This format is felt to be most appropriate for this patient at this time.  Due to technical limitations with video connection Web designer), today's appointment will be conducted as an audio only telehealth visit, and Kari [name] verbally agreed to proceed in this manner.   All issues noted in this document were discussed and addressed.  No physical exam could be performed with this format.  Evaluation Performed:  Preoperative cardiovascular risk assessment _____________   Date:  10/26/2023   Patient ID:  Kari [name], DOB [birth year], MRN [id number] Patient Location:  Home Provider location:   Office  Primary Care Provider:  Sharlene Dory, DO Primary Cardiologist:  Lewayne Bunting, MD  Chief Complaint / Patient Profile   73 y.o. y/o female with a h/o complete heart block s/p PPM, syncope, hypertension, and DVT who is pending  LEFT TOTAL ANTERIO R HIP ARTHROPLASTY with Dr. Marcene Corning of [facility] and presents today for telephonic preoperative cardiovascular risk assessment.  History of Present Illness    Kari [name] is a 73 y.o. female who presents via audio/video conferencing for a telehealth visit today.  Pt was last seen in cardiology clinic on 04/14/2023 by Dr. Ladona Ridgel.  At that time [name] was doing well.  The patient is now pending procedure as outlined above. Since her last visit, she has done well from a cardiac standpoint.  She recently had influenza A, she has noticed some mildly elevated BP in the setting.  She denies chest pain, palpitations, dyspnea, pnd, orthopnea, n, v, dizziness, syncope, edema, weight gain, or early satiety. All other systems reviewed and are otherwise negative except as noted above.   Past Medical History    Past Medical History:   Diagnosis Date   Arthritis    Chronic back pain    Diverticulitis    DVT (deep venous thrombosis) [facility]) age 45   s/p  knee arthroscopy    Herpes zoster conjunctivitis    Menopause    Mobitz type 2 second degree AV block    2:1/notes 02/16/2013   PONV (postoperative nausea and vomiting)    Presence of permanent cardiac pacemaker    Seasonal allergies    "spring & fall; not q year" (02/17/2013)   Shoulder impingement    and left rotator cuff tear   Past Surgical History:  Procedure Laterality Date   ACHILLES TENDON SURGERY     ARTHROGRAM KNEE Right 1975   CHOLECYSTECTOMY  1990's   colonscopy     ESOPHAGOGASTRODUODENOSCOPY     EYE SURGERY     muscle release right eye   INSERT / REPLACE / REMOVE PACEMAKER  02/17/2013   [employer] Advantio dual-chamber pacemaker, model [id number],   KNEE ARTHROSCOPY Right 1982, 1983, 1995, 1996   KNEE ARTHROSCOPY W/ ACL RECONSTRUCTION Left 1984   LAPAROSCOPIC SIGMOID COLECTOMY N/A 01/01/2015   Procedure: LAPAROSCOPIC SIGMOID COLECTOMY;  Surgeon: Almond Lint, MD;  [facility] OR;  Service: General;  Laterality: N/A;   LEAD EXTRACTION N/A 03/24/2022   Procedure: LEAD EXTRACTION;  Surgeon: [name], MD;  [facility] INVASIVE CV LAB;  Service: Cardiovascular;  Laterality: N/A;   PACEMAKER IMPLANT N/A 03/24/2022   Procedure: PACEMAKER IMPLANT;  Surgeon: [name], MD;  [facility] INV[facility] CV LAB;  Service: Cardiovascular;  Laterality: N/A;   PERMANENT  PACEMAKER INSERTION N/A 02/17/2013   Procedure: PERMANENT PACEMAKER INSERTION;  Surgeon: [name], MD;  Location: [facility] CATH LAB;  Service: Cardiovascular;  Laterality: N/A;   SHOULDER ARTHROSCOPY Left 09/05/2016   Procedure: LEFT SHOULDER ARTHROSCOPY , ACROMIOPLASTY AND ROTATOR CUFF REPAIR;  Surgeon: Marcene Corning, MD;  Location: [facility] OR;  Service: Orthopedics;  Laterality: Left;   TONSILLECTOMY  1961   TOTAL KNEE ARTHROPLASTY Right 2000; 06/2000   "replaced w/appropriate hardware"  (02/17/2013)   TOTAL KNEE REVISION Right 04/13/2019   Procedure: Revision Right Knee Arthroplasty;  Surgeon: Gean Birchwood, MD;  Location: [facility];  Service: Orthopedics;  Laterality: Right;   TRIGGER FINGER RELEASE Right ~ 2012   "thumb" (02/17/2013)   VAGINAL HYSTERECTOMY  ~ 1994   partial     Allergies  Allergies  Allergen Reactions   Eliquis [Apixaban] Palpitations    Diaphoresis, tachycardia   Bactrim [Sulfamethoxazole-Trimethoprim] Hives   Ceftin Itching    Tolerates amoxicillin   Vancomycin Hives   Amoxicillin Cough    Difficulty swallowing   Doxycycline     Difficulty swallowing.    Levofloxacin Other (See Comments)    "spacey," tolerates Cipro    Home Medications    Prior to Admission medications   Medication Sig Start Date End Date Taking? Authorizing Provider  benzonatate (TESSALON) 100 MG capsule Take 1 capsule (100 mg total) by mouth 3 (three) times daily as needed for cough. 10/13/23   Waldon Merl, PA-C  CALCIUM PO Take 1 tablet by mouth daily.    [provider]  Cholecalciferol (VITAMIN D3) 125 MCG (5000 UT) CAPS Take 5,000 Units by mouth daily.    [provider]  Coenzyme Q10 (COQ10) 200 MG CAPS Take by mouth daily at 6 (six) AM.    [provider]  Cyanocobalamin (VITAMIN B-12) 5000 MCG SUBL Place under the tongue daily. 5000 I U daily per patient    [provider]  diclofenac sodium (VOLTAREN) 1 % GEL Apply 1-2 g topically 4 (four) times daily as needed (knee pain.).    [provider]  EPINEPHrine 0.3 mg/0.3 mL IJ SOAJ injection Inject 0.3 mg into the muscle as needed for anaphylaxis. 07/31/23   Peter Garter, PA  fluticasone (FLONASE) 50 MCG/ACT nasal spray Place 1 spray into both nostrils daily as needed for allergies or rhinitis.    [provider]  levothyroxine (SYNTHROID) 25 MCG tablet Take 1 tablet (25 mcg total) by mouth daily before breakfast. 04/24/23   Wendling, Jilda Roche, DO   levothyroxine (SYNTHROID) 25 MCG tablet Take 1 tablet (25 mcg total) by mouth daily before breakfast. 04/24/23     metoprolol succinate (TOPROL-XL) 25 MG 24 hr tablet Take 1/2 tablet (12.5 mg total) by mouth daily. 12/31/22   [name], MD  Multiple Vitamin (MULTIVITAMIN WITH MINERALS) TABS Take 1 tablet by mouth daily.    [provider]  Multiple Vitamins-Minerals (AIRBORNE GUMMIES PO) Take 2 each by mouth daily.    [provider]  Omega-3 Fatty Acids (FISH OIL ULTRA) 1400 MG CAPS Take 1,400 mg by mouth daily.    [provider]  Probiotic Product (PROBIOTIC FORMULA PO) Take 1 capsule by mouth daily.    [provider]  rosuvastatin (CRESTOR) 5 MG tablet Take 1 tablet (5 mg total) by mouth daily. 08/12/23   Sharlene Dory, DO  valACYclovir (VALTREX) 1000 MG tablet Take 1 tablet (1,000 mg total) by mouth daily as needed. 08/05/22   Wendling,  Jilda Roche, DO    Physical Exam    Vital Signs:  Roque Cash does not have vital signs available for review today.  Given telephonic nature of communication, physical exam is limited. AAOx3. NAD. Normal affect.  Speech and respirations are unlabored.  Accessory Clinical Findings    None  Assessment & Plan    1.  Preoperative Cardiovascular Risk Assessment:  According to the Revised Cardiac Risk Index (RCRI), her Perioperative Risk of Major Cardiac Event is (%): 0.4. Her Functional Capacity in METs is: 7.01 according to the Duke Activity Status Index (DASI). Therefore, based on ACC/AHA guidelines, patient would be at acceptable risk for the planned procedure without further cardiovascular testing.  The patient was advised that if she develops new symptoms prior to surgery to contact our office to arrange for a follow-up visit, and she verbalized understanding.  Patient has a history of permanent pacemaker.  I will route the original clearance request to our device clinic for any additional  perioperative recommendations.  A copy of this note will be routed to requesting surgeon.  Time:   Today, I have spent 6 minutes with the patient with telehealth technology discussing medical history, symptoms, and management plan.     Joylene Grapes, NP  10/26/2023, 9:36 AM

## 2023-10-27 ENCOUNTER — Telehealth (HOSPITAL_BASED_OUTPATIENT_CLINIC_OR_DEPARTMENT_OTHER): Payer: Self-pay | Admitting: Orthopaedic Surgery

## 2023-10-27 NOTE — Telephone Encounter (Signed)
 Patient does not want to resch appointment with Dr B that she cx via [facility]

## 2023-10-27 NOTE — Telephone Encounter (Signed)
 Left patient a message to call the office back to make an appointment

## 2023-11-02 ENCOUNTER — Encounter (HOSPITAL_BASED_OUTPATIENT_CLINIC_OR_DEPARTMENT_OTHER): Payer: Self-pay

## 2023-11-02 ENCOUNTER — Telehealth (HOSPITAL_BASED_OUTPATIENT_CLINIC_OR_DEPARTMENT_OTHER): Payer: Self-pay | Admitting: Orthopaedic Surgery

## 2023-11-02 NOTE — Telephone Encounter (Signed)
 Patient wants to know if Dr B will do her hip surgery or he will like to discuss surgery with her? She prefers not to see Dr Magnus Ivan. 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 Best contact number for patient [phone]

## 2023-11-02 NOTE — Progress Notes (Signed)
 Remote pacemaker transmission.

## 2023-11-02 NOTE — Addendum Note (Signed)
 Addended by: Geralyn Flash D on: 11/02/2023 03:07 PM   Modules accepted: Orders

## 2023-11-03 ENCOUNTER — Other Ambulatory Visit (HOSPITAL_BASED_OUTPATIENT_CLINIC_OR_DEPARTMENT_OTHER): Payer: Self-pay | Admitting: Orthopaedic Surgery

## 2023-11-03 DIAGNOSIS — M25552 Pain in left hip: Secondary | ICD-10-CM

## 2023-11-03 NOTE — Telephone Encounter (Signed)
 Referral placed in chart for Dr. Roda Shutters

## 2023-11-05 ENCOUNTER — Ambulatory Visit (HOSPITAL_BASED_OUTPATIENT_CLINIC_OR_DEPARTMENT_OTHER): Admitting: Orthopaedic Surgery

## 2023-11-05 ENCOUNTER — Ambulatory Visit (HOSPITAL_BASED_OUTPATIENT_CLINIC_OR_DEPARTMENT_OTHER): Payer: Commercial Managed Care - PPO | Admitting: Orthopaedic Surgery

## 2023-11-10 ENCOUNTER — Other Ambulatory Visit (INDEPENDENT_AMBULATORY_CARE_PROVIDER_SITE_OTHER): Payer: Self-pay

## 2023-11-10 ENCOUNTER — Ambulatory Visit: Admitting: Orthopaedic Surgery

## 2023-11-10 ENCOUNTER — Encounter: Payer: Self-pay | Admitting: Orthopaedic Surgery

## 2023-11-10 DIAGNOSIS — M1612 Unilateral primary osteoarthritis, left hip: Secondary | ICD-10-CM

## 2023-11-10 DIAGNOSIS — M25552 Pain in left hip: Secondary | ICD-10-CM | POA: Diagnosis not present

## 2023-11-10 NOTE — Progress Notes (Signed)
 Office Visit Note   Patient: Kari [name]           Date of Birth: 02-07-1951           MRN: [id number] Visit Date: 11/10/2023              Requested by: Huel Cote, MD [address] [place],  [place] [address] PCP: Sharlene Dory, DO   Assessment & Plan: Visit Diagnoses:  1. Primary osteoarthritis of left hip     Plan: Impression is severe left hip degenerative joint disease secondary to Osteoarthritis.  Imaging shows bone on bone joint space narrowing.  At this point, conservative treatments fail to provide any significant relief and the pain is severely affecting ADLs and quality of life.  Based on treatment options, the patient has elected to move forward with a hip replacement.  We have discussed the surgical risks that include but are not limited to infection, DVT, leg length discrepancy, numbness, tingling, incomplete relief of pain.  Recovery and prognosis were also reviewed.    Once we have the necessary clearances Eunice Blase will call the patient to schedule surgery ASAP.  Current anticoagulants: No antithrombotic Postop anticoagulation: Aspirin 81 mg Diabetic: No  Prior DVT/PE: No Tobacco use: No Clearances needed for surgery: Lewayne Bunting cardiologist; Arva Chafe PCP Anticipate discharge dispo: home   Total face to face encounter time was greater than 25 minutes and over half of this time was spent in counseling and/or coordination of care.  Follow-Up Instructions: No follow-ups on file.   Orders:  Orders Placed This Encounter  Procedures   XR HIP UNILAT W OR W/O PELVIS 2-3 VIEWS LEFT   No orders of the defined types were placed in this encounter.     Procedures: No procedures performed   Clinical Data: No additional findings.   Subjective: Chief Complaint  Patient presents with   Left Hip - Pain    HPI Kari [name] is a 73 year old female comes in for chronic severe left hip pain.  Her symptoms have gotten much worse  in the last year.  She reports circumferential hip and groin pain is worse with ADLs and while she is at work.  She works as a Engineer, civil (consulting) at [employer].  She had a prior cortisone injection under ultrasound that did not provide significant relief.  She had an MRI in July 2024 which showed high-grade chondromalacia. Review of Systems  Constitutional: Negative.   HENT: Negative.    Eyes: Negative.   Respiratory: Negative.    Cardiovascular: Negative.   Endocrine: Negative.   Musculoskeletal: Negative.   Neurological: Negative.   Hematological: Negative.   Psychiatric/Behavioral: Negative.    All other systems reviewed and are negative.    Objective: Vital Signs: There were no vitals taken for this visit.  Physical Exam Vitals and nursing note reviewed.  Constitutional:      Appearance: She is well-developed.  HENT:     Head: Normocephalic and atraumatic.  Pulmonary:     Effort: Pulmonary effort is normal.  Abdominal:     Palpations: Abdomen is soft.  Musculoskeletal:     Cervical back: Neck supple.  Skin:    General: Skin is warm.     Capillary Refill: Capillary refill takes less than 2 seconds.  Neurological:     Mental Status: She is alert and oriented to person, place, and time.  Psychiatric:        Behavior: Behavior normal.  Thought Content: Thought content normal.        Judgment: Judgment normal.     Ortho Exam Examination of the left hip shows significant pain with hip flexion and internal rotation.  Slight tenderness to the trochanteric area.  No sciatic tension signs.  Pain with straight leg raise.  Positive Stinchfield sign. Specialty Comments:  No specialty comments available.  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   Painful total knee replacement, right (HCC) 04/12/2019   Near syncope 03/11/2013   Bradycardia 02/21/2013   Pacemaker 02/20/2013   Chest tightness 02/19/2013   Palpitations 02/19/2013   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   Diverticulitis 03/03/2012   Chronic low back pain 08/28/2011   GERD (gastroesophageal reflux disease)    Herpes zoster conjunctivitis    Menopause    Arthritis    Diverticulosis    Allergy    Past Medical History:  Diagnosis Date   Arthritis    Chronic back pain    Diverticulitis    DVT (deep venous thrombosis) (HCC) age 73   s/p  knee arthroscopy    Herpes zoster conjunctivitis    Menopause    Mobitz type 2 second degree AV block    2:1/notes 02/16/2013   PONV (postoperative nausea and vomiting)    Presence of permanent cardiac pacemaker    Seasonal allergies    "spring & fall; not q year" (02/17/2013)   Shoulder impingement    and left rotator cuff tear    Family History  Problem Relation Age of Onset   Hypertension Mother    Hyperlipidemia Mother    Macular degeneration Mother    COPD Mother    Lung cancer Father    Cancer Father        Lung   COPD Father    Diabetes Brother    Breast cancer Paternal Aunt     Past Surgical History:  Procedure Laterality Date   ACHILLES TENDON SURGERY     ARTHROGRAM KNEE Right 1975   CHOLECYSTECTOMY  1990's   colonscopy     ESOPHAGOGASTRODUODENOSCOPY     EYE SURGERY     muscle release right eye   INSERT / REPLACE / REMOVE PACEMAKER  02/17/2013   [employer] Advantio dual-chamber pacemaker, model [id number],   KNEE ARTHROSCOPY Right 1982, 1983, 1995, 1996   KNEE ARTHROSCOPY W/ ACL RECONSTRUCTION Left 1984    LAPAROSCOPIC SIGMOID COLECTOMY N/A 01/01/2015   Procedure: LAPAROSCOPIC SIGMOID COLECTOMY;  Surgeon: Almond Lint, MD;  Location: [facility] OR;  Service: General;  Laterality: N/A;   LEAD EXTRACTION N/A 03/24/2022   Procedure: LEAD EXTRACTION;  Surgeon: [name], MD;  Location: [facility] INVASIVE CV [facility];  Service: Cardiovascular;  Laterality: N/A;   PACEMAKER IMPLANT N/A 03/24/2022   Procedure: PACEMAKER IMPLANT;  Surgeon: [name], MD;  Location: [facility]AS[facility];  Service: Cardiovascular;  Laterality: N/A;   PERMANENT PACEMAKER INSERTION N/A 02/17/2013   Procedure: PERMANENT PACEMAKER INSERTION;  Surgeon: [name], MD;  Location: [facility] CATH [facility];  Service: Cardiovascular;  Laterality: N/A;   SHOULDER ARTHROSCOPY Left 09/05/2016   Procedure: LEFT SHOULDER ARTHROSCOPY , ACROMIOPLASTY AND ROTATOR CUFF REPAIR;  Surgeon: Marcene Corning, MD;  Location: [facility] OR;  Service: Orthopedics;  Laterality: Left;   TONSILLECTOMY  1961   TOTAL KNEE ARTHROPLASTY Right 2000; 06/2000   "replaced w/appropriate hardware" (02/17/2013)   TOTAL KNEE REVISION Right 04/13/2019   Procedure: Revision Right Knee Arthroplasty;  Surgeon: Gean Birchwood, MD;  Location: [facility]S;  Service: Orthopedics;  Laterality: Right;   TRIGGER FINGER RELEASE Right ~ 2012   "thumb" (02/17/2013)   VAGINAL HYSTERECTOMY  ~ 1994   partial    Social History   Occupational History   Occupation: Teacher, 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[employer]    Comment: Administrator, Civil Service  Tobacco Use   Smoking status: Never   Smokeless tobacco: Never  Vaping Use   Vaping status: Never Used  Substance and Sexual Activity   Alcohol use: Yes    Comment: occasional wine    Drug use: No   Sexual activity: Yes    Birth control/protection: Surgical    Comment: 02/17/2013 "I have a same sex partner"

## 2023-11-11 ENCOUNTER — Other Ambulatory Visit: Payer: Self-pay

## 2023-11-11 ENCOUNTER — Encounter (HOSPITAL_COMMUNITY): Payer: Self-pay | Admitting: Emergency Medicine

## 2023-11-11 ENCOUNTER — Encounter: Payer: Self-pay | Admitting: Internal Medicine

## 2023-11-11 ENCOUNTER — Emergency Department (HOSPITAL_COMMUNITY)

## 2023-11-11 ENCOUNTER — Other Ambulatory Visit (HOSPITAL_COMMUNITY): Payer: Self-pay

## 2023-11-11 ENCOUNTER — Emergency Department (HOSPITAL_COMMUNITY)
Admission: EM | Admit: 2023-11-11 | Discharge: 2023-11-11 | Disposition: A | Discharge status: Home / Self Care | Attending: Emergency Medicine | Admitting: Emergency Medicine

## 2023-11-11 ENCOUNTER — Ambulatory Visit: Admitting: Orthopaedic Surgery

## 2023-11-11 DIAGNOSIS — R61 Generalized hyperhidrosis: Secondary | ICD-10-CM | POA: Diagnosis not present

## 2023-11-11 DIAGNOSIS — Z950 Presence of cardiac pacemaker: Secondary | ICD-10-CM | POA: Diagnosis not present

## 2023-11-11 DIAGNOSIS — I10 Essential (primary) hypertension: Secondary | ICD-10-CM | POA: Diagnosis not present

## 2023-11-11 DIAGNOSIS — Z79899 Other long term (current) drug therapy: Secondary | ICD-10-CM | POA: Diagnosis not present

## 2023-11-11 DIAGNOSIS — R079 Chest pain, unspecified: Secondary | ICD-10-CM

## 2023-11-11 DIAGNOSIS — R0789 Other chest pain: Secondary | ICD-10-CM | POA: Diagnosis not present

## 2023-11-11 LAB — BASIC METABOLIC PANEL
Anion gap: 10 (ref 5–15)
BUN: 10 mg/dL (ref 8–23)
CO2: 24 mmol/L (ref 22–32)
Calcium: 9.2 mg/dL (ref 8.9–10.3)
Chloride: 104 mmol/L (ref 98–111)
Creatinine, Ser: 0.78 mg/dL (ref 0.44–1.00)
GFR, Estimated: >60 mL/min (ref >60)
Glucose, Bld: 113 mg/dL — ABNORMAL HIGH (ref 70–99)
Potassium: 3.4 mmol/L — ABNORMAL LOW (ref 3.5–5.1)
Sodium: 138 mmol/L (ref 135–145)

## 2023-11-11 LAB — CBC
HCT: 39.7 % (ref 36.0–46.0)
Hemoglobin: 12.9 g/dL (ref 12.0–15.0)
MCH: 30.9 pg (ref 26.0–34.0)
MCHC: 32.5 g/dL (ref 30.0–36.0)
MCV: 95.2 fL (ref 80.0–100.0)
Platelets: 265 10*3/uL (ref 150–400)
RBC: 4.17 MIL/uL (ref 3.87–5.11)
RDW: 13.9 % (ref 11.5–15.5)
WBC: 5.6 10*3/uL (ref 4.0–10.5)
nRBC: 0 % (ref 0.0–0.2)

## 2023-11-11 LAB — HEPATIC FUNCTION PANEL
ALT: 24 U/L (ref 0–44)
AST: 27 U/L (ref 15–41)
Albumin: 3.7 g/dL (ref 3.5–5.0)
Alkaline Phosphatase: 54 U/L (ref 38–126)
Bilirubin, Direct: 0.1 mg/dL (ref 0.0–0.2)
Indirect Bilirubin: 0.5 mg/dL (ref 0.3–0.9)
Total Bilirubin: 0.6 mg/dL (ref 0.0–1.2)
Total Protein: 6.9 g/dL (ref 6.5–8.1)

## 2023-11-11 LAB — TROPONIN I (HIGH SENSITIVITY)
Troponin I (High Sensitivity): 4 ng/L (ref <18)
Troponin I (High Sensitivity): 4 ng/L (ref <18)

## 2023-11-11 LAB — LIPASE, BLOOD: Lipase: 27 U/L (ref 11–51)

## 2023-11-11 MED ORDER — METOPROLOL SUCCINATE ER 25 MG PO TB24
25.0000 mg | ORAL_TABLET | Freq: Every day | ORAL | 3 refills | Status: AC
  Filled 2023-11-11 – 2023-12-03 (×5): qty 90, 90d supply, fill #0
  Filled 2024-02-28: qty 90, 90d supply, fill #1
  Filled 2024-06-02: qty 90, 90d supply, fill #2
  Filled 2024-08-26: qty 90, 90d supply, fill #3
  Filled 2024-09-06: qty 90, 90d supply, fill #0

## 2023-11-11 MED ORDER — ASPIRIN 81 MG PO CHEW
81.0000 mg | CHEWABLE_TABLET | Freq: Once | ORAL | Status: AC
  Administered 2023-11-11: 81 mg via ORAL
  Filled 2023-11-11: qty 1

## 2023-11-11 MED ORDER — NITROGLYCERIN 0.4 MG SL SUBL
0.4000 mg | SUBLINGUAL_TABLET | SUBLINGUAL | Status: DC | PRN
  Administered 2023-11-11 (×2): 0.4 mg via SUBLINGUAL
  Filled 2023-11-11: qty 1

## 2023-11-11 NOTE — ED Triage Notes (Signed)
 Pt c/o states that she woke up with central chest pressure, sweaty, hypertensive, indigestion x 1 hour.

## 2023-11-11 NOTE — ED Provider Notes (Signed)
 [place] [facility]MENT AT [facility] Provider Note   CSN: [id number] Arrival date & time: 11/11/23  0257     History  Chief Complaint  Patient presents with   Chest Pain    Kari [name] is a 73 y.o. female.  73 yo F w/ h/o bradycardia s/p pacemaker who presents to the ED with chest pain, nausea, dyspnea, diaphoresis and indigestion feeling that woke her from sleep. Was hypertensive and and relatively tachycardic at the time. No h/o same. H/o provoked DVT after surgery but no h/o PE or other dvt's. No longer anticoagulated.    Chest Pain      Home Medications Prior to Admission medications   Medication Sig Start Date End Date Taking? Authorizing Provider  benzonatate (TESSALON) 100 MG capsule Take 1 capsule (100 mg total) by mouth 3 (three) times daily as needed for cough. 10/13/23   Waldon Merl, PA-C  CALCIUM PO Take 1 tablet by mouth daily.    [provider]  Cholecalciferol (VITAMIN D3) 125 MCG (5000 UT) CAPS Take 5,000 Units by mouth daily.    [provider]  Coenzyme Q10 (COQ10) 200 MG CAPS Take by mouth daily at 6 (six) AM.    [provider]  Cyanocobalamin (VITAMIN B-12) 5000 MCG SUBL Place under the tongue daily. 5000 I U daily per patient    [provider]  diclofenac sodium (VOLTAREN) 1 % GEL Apply 1-2 g topically 4 (four) times daily as needed (knee pain.).    [provider]  EPINEPHrine 0.3 mg/0.3 mL IJ SOAJ injection Inject 0.3 mg into the muscle as needed for anaphylaxis. 07/31/23   Peter Garter, PA  fluticasone (FLONASE) 50 MCG/ACT nasal spray Place 1 spray into both nostrils daily as needed for allergies or rhinitis.    [provider]  levothyroxine (SYNTHROID) 25 MCG tablet Take 1 tablet (25 mcg total) by mouth daily before breakfast. 04/24/23   Wendling, Jilda Roche, DO  levothyroxine (SYNTHROID) 25 MCG tablet Take 1 tablet (25 mcg total) by mouth daily before  breakfast. 04/24/23     metoprolol succinate (TOPROL-XL) 25 MG 24 hr tablet Take 1/2 tablet (12.5 mg total) by mouth daily. 12/31/22   [name], MD  Multiple Vitamin (MULTIVITAMIN WITH MINERALS) TABS Take 1 tablet by mouth daily.    [provider]  Multiple Vitamins-Minerals (AIRBORNE GUMMIES PO) Take 2 each by mouth daily.    [provider]  Omega-3 Fatty Acids (FISH OIL ULTRA) 1400 MG CAPS Take 1,400 mg by mouth daily.    [provider]  Probiotic Product (PROBIOTIC FORMULA PO) Take 1 capsule by mouth daily.    [provider]  rosuvastatin (CRESTOR) 5 MG tablet Take 1 tablet (5 mg total) by mouth daily. 10/26/23   Sharlene Dory, DO  valACYclovir (VALTREX) 1000 MG tablet Take 1 tablet (1,000 mg total) by mouth daily as needed. 08/05/22   Sharlene Dory, DO      Allergies    Eliquis [apixaban], Bactrim [sulfamethoxazole-trimethoprim], Ceftin, Vancomycin, Amoxicillin, Doxycycline, and Levofloxacin    Review of Systems   Review of Systems  Cardiovascular:  Positive for chest pain.    Physical Exam Updated Vital Signs BP 132/73   Pulse 60   Temp (!) 97.4 F (36.3 C)   Resp 14   Ht 5\' 8"  (1.727 m)   Wt 70.3 kg   SpO2 94%   BMI 23.57 kg/m  Physical Exam Vitals and nursing  note reviewed.  Constitutional:      Appearance: She is well-developed.  HENT:     Head: Normocephalic and atraumatic.  Cardiovascular:     Rate and Rhythm: Normal rate and regular rhythm.  Pulmonary:     Effort: No respiratory distress.     Breath sounds: No stridor.  Chest:     Chest wall: No mass or tenderness.  Abdominal:     General: There is no distension.     Palpations: Abdomen is soft.  Musculoskeletal:     Cervical back: Normal range of motion.  Neurological:     Mental Status: She is alert.     ED Results / Procedures / Treatments   Labs (all labs ordered are listed, but only abnormal results are displayed) Labs Reviewed   BASIC METABOLIC PANEL - Abnormal; Notable for the following components:      Result Value   Potassium 3.4 (*)    Glucose, Bld 113 (*)    All other components within normal limits  CBC  HEPATIC FUNCTION PANEL  LIPASE, BLOOD  TROPONIN I (HIGH SENSITIVITY)  TROPONIN I (HIGH SENSITIVITY)    EKG EKG Interpretation Date/Time:  Wednesday November 11 2023 03:04:41 EDT Ventricular Rate:  88 PR Interval:  206 QRS Duration:  146 QT Interval:  416 QTC Calculation: 503 R Axis:   80  Text Interpretation: Atrial-sensed ventricular-paced rhythm Abnormal ECG When compared with ECG of 14-Oct-2023 11:36, No significant change was found Confirmed by Dione Booze ([id number]) on 11/11/2023 3:26:11 AM  Radiology DG Chest 2 View Result Date: 11/11/2023 CLINICAL DATA:  Chest pain EXAM: CHEST - 2 VIEW COMPARISON:  03/20/2023 FINDINGS: Left chest wall pacemaker. Normal cardiomediastinal silhouette. No focal consolidation, pleural effusion, or pneumothorax. No displaced rib fractures. IMPRESSION: No active cardiopulmonary disease. Electronically Signed   By: Minerva Fester M.D.   On: 11/11/2023 03:24   XR HIP UNILAT W OR W/O PELVIS 2-3 VIEWS LEFT Result Date: 11/10/2023 Advanced degenerative joint disease with bone-on-bone joint space narrowing of the left hip joint   Procedures Procedures    Medications Ordered in ED Medications  nitroGLYCERIN (NITROSTAT) SL tablet 0.4 mg (0.4 mg Sublingual Given 11/11/23 0418)  aspirin chewable tablet 81 mg (81 mg Oral Given 11/11/23 0347)    ED Course/ Medical Decision Making/ A&P                                 Medical Decision Making Amount and/or Complexity of Data Reviewed Labs: ordered. Radiology: ordered.  Risk OTC drugs. Prescription drug management.   Symptoms improved with NTG and improved BP. Ecg baseline. Trops normal. D/w cardiology, doubt ACS, can fu outpatient.  Considered PE however no tachy, dyspnea and symptoms inconsistent with same.   Considered dissection however with improvement iwht her BP, normal cxr and intact symmetric dp/radial pulses thought it to be unlikely.   Final Clinical Impression(s) / ED Diagnoses Final diagnoses:  Nonspecific chest pain  Hypertension, unspecified type    Rx / DC Orders ED Discharge Orders     None         Skyeler Scalese, Barbara Cower, MD 11/11/23 [phone]

## 2023-11-16 ENCOUNTER — Telehealth: Payer: Self-pay

## 2023-11-16 ENCOUNTER — Encounter: Payer: Self-pay | Admitting: Internal Medicine

## 2023-11-16 NOTE — Telephone Encounter (Signed)
 Spoke patient who informed me that she has already had a virtual visit on 10/26/23 for this procedure she just decided to go with a different provider.

## 2023-11-16 NOTE — Telephone Encounter (Signed)
   Primary Cardiologist: Lewayne Bunting, MD  Chart reviewed as part of pre-operative protocol coverage. Given past medical history and time since last visit, based on ACC/AHA guidelines, [place] would be at acceptable risk for the planned procedure without further cardiovascular testing.   Patient was advised that if she develops new symptoms prior to surgery to contact our office to arrange a follow-up appointment. She verbalized understanding.  I will route this recommendation to the requesting party via Epic fax function and remove from pre-op pool.  Please call with questions.  Levi Aland, NP-C  11/16/2023, 2:14 PM [address] Office [phone] Fax [phone]

## 2023-11-16 NOTE — Telephone Encounter (Signed)
 Primary Cardiologist:Gregg Ladona Ridgel, MD   Preoperative team, please contact this patient and set up a phone call appointment for further preoperative risk assessment. Please obtain consent and complete medication review. Thank you for your help.   I confirm that guidance regarding antiplatelet and oral anticoagulation therapy has been completed and, if necessary, noted below (none requested).   Routing to device clinic for their awareness.   I also confirmed the patient resides in the state of [place]. As per [facility] Medical Board telemedicine laws, the patient must reside in the state in which the provider is licensed.   Levi Aland, NP-C  11/16/2023, 12:59 PM [address], [address] Office [phone] Fax [phone]

## 2023-11-16 NOTE — Progress Notes (Signed)
 PERIOPERATIVE PRESCRIPTION FOR IMPLANTED CARDIAC DEVICE PROGRAMMING   Patient Information:  Patient: Kari [name]  MRN: [id number]  Date of Birth: 05-Dec-1950   Procedure:   Left Total Hip Arthroplasty Date of Surgery:  Clearance TBD                              Surgeon:  Cheral Almas, MD Surgeon's Group or Practice Name:  [facility] number:  [phone] Fax number:  [phone]   Device Information:   Clinic EP Physician:   Dr. Lewayne Bunting Device Type:  Pacemaker Manufacturer and Phone #:  [employer]: [id number] Pacemaker Dependent?:  Yes Date of Last Device Check:  09/22/23         Normal Device Function?:  Yes     Electrophysiologist's Recommendations:   Have magnet available. Provide continuous ECG monitoring when magnet is used or reprogramming is to be performed.  Procedure should not interfere with device function.  No device programming or magnet placement needed.  Per Device Clinic Standing Orders, Lenor Coffin  11/16/2023 2:53 PM

## 2023-11-16 NOTE — Telephone Encounter (Signed)
   Pre-operative Risk Assessment    Patient Name: Kari [name]  DOB: 12-30-1950 MRN: [id number]   Date of last office visit: 04/14/23 Date of next office visit: Not scheduled   Request for Surgical Clearance    Procedure:   Left Total Hip Arthroplasty  Date of Surgery:  Clearance TBD                                Surgeon:  Cheral Almas, MD Surgeon's Group or Practice Name:  [facility] at [facility] number:  [phone] Fax number:  [phone]   Type of Clearance Requested:   - Medical    Type of Anesthesia:  Spinal   Additional requests/questions:    Garrel Ridgel   11/16/2023, 11:49 AM

## 2023-11-17 ENCOUNTER — Other Ambulatory Visit (HOSPITAL_COMMUNITY): Payer: Self-pay

## 2023-11-17 ENCOUNTER — Other Ambulatory Visit: Payer: Self-pay

## 2023-11-20 ENCOUNTER — Other Ambulatory Visit (HOSPITAL_COMMUNITY): Payer: Self-pay

## 2023-11-20 ENCOUNTER — Telehealth: Admitting: Physician Assistant

## 2023-11-20 DIAGNOSIS — N3090 Cystitis, unspecified without hematuria: Secondary | ICD-10-CM | POA: Diagnosis not present

## 2023-11-20 MED ORDER — NITROFURANTOIN MONOHYD MACRO 100 MG PO CAPS
100.0000 mg | ORAL_CAPSULE | Freq: Two times a day (BID) | ORAL | 0 refills | Status: AC
  Filled 2023-11-20: qty 6, 3d supply, fill #0
  Filled 2023-11-20: qty 4, 2d supply, fill #0

## 2023-11-20 NOTE — Progress Notes (Signed)
E-Visit for Urinary Problems  We are sorry that you are not feeling well.  Here is how we plan to help!  Based on what you shared with me it looks like you most likely have a simple urinary tract infection.  A UTI (Urinary Tract Infection) is a bacterial infection of the bladder.  Most cases of urinary tract infections are simple to treat but a key part of your care is to encourage you to drink plenty of fluids and watch your symptoms carefully.  I have prescribed MacroBid 100 mg twice a day for 5 days.  Your symptoms should gradually improve. Call us if the burning in your urine worsens, you develop worsening fever, back pain or pelvic pain or if your symptoms do not resolve after completing the antibiotic.  Urinary tract infections can be prevented by drinking plenty of water to keep your body hydrated.  Also be sure when you wipe, wipe from front to back and don't hold it in!  If possible, empty your bladder every 4 hours.  HOME CARE Drink plenty of fluids Compete the full course of the antibiotics even if the symptoms resolve Remember, when you need to go.go. Holding in your urine can increase the likelihood of getting a UTI! GET HELP RIGHT AWAY IF: You cannot urinate You get a high fever Worsening back pain occurs You see blood in your urine You feel sick to your stomach or throw up You feel like you are going to pass out  MAKE SURE YOU  Understand these instructions. Will watch your condition. Will get help right away if you are not doing well or get worse.   Thank you for choosing an e-visit.  Your e-visit answers were reviewed by a board certified advanced clinical practitioner to complete your personal care plan. Depending upon the condition, your plan could have included both over the counter or prescription medications.  Please review your pharmacy choice. Make sure the pharmacy is open so you can pick up prescription now. If there is a problem, you may contact your  provider through MyChart messaging and have the prescription routed to another pharmacy.  Your safety is important to us. If you have drug allergies check your prescription carefully.   For the next 24 hours you can use MyChart to ask questions about today's visit, request a non-urgent call back, or ask for a work or school excuse. You will get an email in the next two days asking about your experience. I hope that your e-visit has been valuable and will speed your recovery.  Approximately 5 minutes was spent documenting and reviewing patient's chart.   

## 2023-11-23 ENCOUNTER — Telehealth: Payer: Self-pay | Admitting: Orthopaedic Surgery

## 2023-11-23 NOTE — Telephone Encounter (Signed)
 Patient called. Says she has not heard anything about her surgery.

## 2023-11-24 ENCOUNTER — Telehealth: Payer: Self-pay

## 2023-11-24 NOTE — Telephone Encounter (Signed)
 Copied from CRM [id number]. Topic: General - Other >> Nov 24, 2023 12:49 PM Cammy Copa D wrote: Reason for CRM: Debbie from [facility] calling in regards to surgery clearance that needs signing by Dr. Carmelia Roller. Form was faxed 3/24 at 11:34am, just needs to be signed by PCP.   Debbie from [facility] - Dr. Warren Danes Office  Returned Debbie's call and she was advised that form was faxed on 11/18/23 and going to refax the form today, 11/24/23.

## 2023-11-25 ENCOUNTER — Encounter: Payer: Self-pay | Admitting: Orthopaedic Surgery

## 2023-11-25 NOTE — Telephone Encounter (Signed)
See message please . Thanks.

## 2023-11-26 ENCOUNTER — Ambulatory Visit
Admission: EM | Admit: 2023-11-26 | Discharge: 2023-11-26 | Disposition: A | Discharge status: Home / Self Care | Attending: Family Medicine | Admitting: Family Medicine

## 2023-11-26 DIAGNOSIS — N3001 Acute cystitis with hematuria: Secondary | ICD-10-CM | POA: Insufficient documentation

## 2023-11-26 LAB — POCT URINALYSIS DIP (MANUAL ENTRY)
Bilirubin, UA: NEGATIVE
Glucose, UA: NEGATIVE mg/dL
Ketones, POC UA: NEGATIVE mg/dL
Nitrite, UA: POSITIVE — AB
Protein Ur, POC: NEGATIVE mg/dL
Spec Grav, UA: <=1.005 — AB (ref 1.010–1.025)
Urobilinogen, UA: 0.2 U/dL
pH, UA: 5 (ref 5.0–8.0)

## 2023-11-26 MED ORDER — CIPROFLOXACIN HCL 500 MG PO TABS: 500.0000 mg | ORAL_TABLET | Freq: Two times a day (BID) | ORAL | 0 refills | Status: DC

## 2023-11-26 NOTE — ED Provider Notes (Signed)
 [facility] URGENT [facility]ARE CENTER  Note:  This document was prepared using Conservation officer, historic buildings and may include unintentional dictation errors.  MRN: [id number] DOB: [birth year]  Subjective:   Kari [name] is a 73 y.o. female presenting for 5-day history of persistent urinary frequency and urgency.  Patient has longstanding history of recurrent UTIs.  She currently does not see an urologist.  Typically has to undergo 10-day course of Macrobid.  She had done an e-visit and was prescribed 5 days of Macrobid.  Her symptoms did not resolve.  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  This was the case this past week.  No fever, flank pain, hematuria.  Has used Azo for symptomatic relief.  No current facility-administered medications for this encounter.  Current Outpatient Medications:    benzonatate (TESSALON) 100 MG capsule, Take 1 capsule (100 mg total) by mouth 3 (three) times daily as needed for cough., Disp: 30 capsule, Rfl: 0   CALCIUM PO, Take 1 tablet by mouth daily., Disp: , Rfl:    Cholecalciferol (VITAMIN D3) 125 MCG (5000 UT) CAPS, Take 5,000 Units by mouth daily., Disp: , Rfl:    Coenzyme Q10 (COQ10) 200 MG CAPS, Take by mouth daily at 6 (six) AM., Disp: , Rfl:    Cyanocobalamin (VITAMIN B-12) 5000 MCG SUBL, Place under the tongue daily. 5000 I U daily per patient, Disp: , Rfl:    diclofenac sodium (VOLTAREN) 1 % GEL, Apply 1-2 g topically 4 (four) times daily as needed (knee pain.)., Disp: , Rfl:    EPINEPHrine 0.3 mg/0.3 mL IJ SOAJ injection, Inject 0.3 mg into the muscle as needed for anaphylaxis., Disp: 2 each, Rfl: 0   fluticasone (FLONASE) 50 MCG/ACT nasal spray, Place 1 spray into both nostrils daily as needed for allergies or rhinitis., Disp: , Rfl:    levothyroxine (SYNTHROID) 25 MCG tablet, Take 1 tablet (25 mcg total) by mouth daily before breakfast., Disp: 90 tablet, Rfl: 3    levothyroxine (SYNTHROID) 25 MCG tablet, Take 1 tablet (25 mcg total) by mouth daily before breakfast., Disp: 90 tablet, Rfl: 3   metoprolol succinate (TOPROL XL) 25 MG 24 hr tablet, Take 1 tablet (25 mg total) by mouth daily., Disp: 90 tablet, Rfl: 3   Multiple Vitamin (MULTIVITAMIN WITH MINERALS) TABS, Take 1 tablet by mouth daily., Disp: , Rfl:    Multiple Vitamins-Minerals (AIRBORNE GUMMIES PO), Take 2 each by mouth daily., Disp: , Rfl:    Omega-3 Fatty Acids (FISH OIL ULTRA) 1400 MG CAPS, Take 1,400 mg by mouth daily., Disp: , Rfl:    Probiotic Product (PROBIOTIC FORMULA PO), Take 1 capsule by mouth daily., Disp: , Rfl:    rosuvastatin (CRESTOR) 5 MG tablet, Take 1 tablet (5 mg total) by mouth daily., Disp: 100 tablet, Rfl: 3   valACYclovir (VALTREX) 1000 MG tablet, Take 1 tablet (1,000 mg total) by mouth daily as needed., Disp: 90 tablet, Rfl: 2   Allergies  Allergen Reactions   Eliquis [Apixaban] Palpitations    Diaphoresis, tachycardia   Bactrim [Sulfamethoxazole-Trimethoprim] Hives   Ceftin Itching    Tolerates amoxicillin   Vancomycin Hives   Amoxicillin Cough    Difficulty swallowing   Doxycycline     Difficulty swallowing.    Levofloxacin Other (See Comments)    "spacey," tolerates Cipro    Past Medical History:  Diagnosis Date   Arthritis    Chronic back  pain    Diverticulitis    DVT (deep venous thrombosis) [facility]) age 19   s/p  knee arthroscopy    Herpes zoster conjunctivitis    Menopause    Mobitz type 2 second degree AV block    2:1/notes 02/16/2013   PONV (postoperative nausea and vomiting)    Presence of permanent cardiac pacemaker    Seasonal allergies    "spring & fall; not q year" (02/17/2013)   Shoulder impingement    and left rotator cuff tear     Past Surgical History:  Procedure Laterality Date   ACHILLES TENDON SURGERY     ARTHROGRAM KNEE Right 1975   CHOLECYSTECTOMY  1990's   colonscopy     ESOPHAGOGASTRODUODENOSCOPY     EYE SURGERY      muscle release right eye   INSERT / REPLACE / REMOVE PACEMAKER  02/17/2013   [employer] Scientific Advantio dual-chamber pacemaker, model [id number],   KNEE ARTHROSCOPY Right 1982, 1983, 1995, 1996   KNEE ARTHROSCOPY W/ ACL RECONSTRUCTION Left 1984   LAPAROSCOPIC SIGMOID COLECTOMY N/A 01/01/2015   Procedure: LAPAROSCOPIC SIGMOID COLECTOMY;  Surgeon: Almond Lint, MD;  Location: [facility] OR;  Service: General;  Laterality: N/A;   LEAD EXTRACTION N/A 03/24/2022   Procedure: LEAD EXTRACTION;  Surgeon: [name], MD;  Location: [facility] INVASIVE CV LAB;  Service: Cardiovascular;  Laterality: N/A;   PACEMAKER IMPLANT N/A 03/24/2022   Procedure: PACEMAKER IMPLANT;  Surgeon: [name], MD;  Location: [facility] INVASIVE CV LAB;  Service: Cardiovascular;  Laterality: N/A;   PERMANENT PACEMAKER INSERTION N/A 02/17/2013   Procedure: PERMANENT PACEMAKER INSERTION;  Surgeon: [name], MD;  Location: [facility] CATH LAB;  Service: Cardiovascular;  Laterality: N/A;   SHOULDER ARTHROSCOPY Left 09/05/2016   Procedure: LEFT SHOULDER ARTHROSCOPY , ACROMIOPLASTY AND ROTATOR CUFF REPAIR;  Surgeon: Marcene Corning, MD;  Location: [facility] OR;  Service: Orthopedics;  Laterality: Left;   TONSILLECTOMY  1961   TOTAL KNEE ARTHROPLASTY Right 2000; 06/2000   "replaced w/appropriate hardware" (02/17/2013)   TOTAL KNEE REVISION Right 04/13/2019   Procedure: Revision Right Knee Arthroplasty;  Surgeon: Gean Birchwood, MD;  Location: [facility] ORS;  Service: Orthopedics;  Laterality: Right;   TRIGGER FINGER RELEASE Right ~ 2012   "thumb" (02/17/2013)   VAGINAL HYSTERECTOMY  ~ 1994   partial     Family History  Problem Relation Age of Onset   Hypertension Mother    Hyperlipidemia Mother    Macular degeneration Mother    COPD Mother    Lung cancer Father    Cancer Father        Lung   COPD Father    Diabetes Brother    Breast cancer Paternal Aunt     Social History   Tobacco Use   Smoking status: Never   Smokeless tobacco: Never  Vaping Use    Vaping status: Never Used  Substance Use Topics   Alcohol use: Yes    Comment: occasional wine    Drug use: No    ROS   Objective:   Vitals: BP (!) 164/98 (BP Location: Right Arm)   Pulse 61   Temp 98 F (36.7 C) (Oral)   Resp 16   SpO2 95%   Physical Exam Constitutional:      General: She is not in acute distress.    Appearance: Normal appearance. She is well-developed. She is not ill-appearing, toxic-appearing or diaphoretic.  HENT:     Head: Normocephalic and atraumatic.     Nose: Nose  normal.     Mouth/Throat:     Mouth: Mucous membranes are moist.  Eyes:     General: No scleral icterus.       Right eye: No discharge.        Left eye: No discharge.     Extraocular Movements: Extraocular movements intact.     Conjunctiva/sclera: Conjunctivae normal.  Cardiovascular:     Rate and Rhythm: Normal rate.  Pulmonary:     Effort: Pulmonary effort is normal.  Abdominal:     General: Bowel sounds are normal. There is no distension.     Palpations: Abdomen is soft. There is no mass.     Tenderness: There is no abdominal tenderness. There is no right CVA tenderness, left CVA tenderness, guarding or rebound.  Skin:    General: Skin is warm and dry.  Neurological:     General: No focal deficit present.     Mental Status: She is alert and oriented to person, place, and time.  Psychiatric:        Mood and Affect: Mood normal.        Behavior: Behavior normal.        Thought Content: Thought content normal.        Judgment: Judgment normal.     Results for orders placed or performed during the hospital encounter of 11/26/23 (from the past 24 hours)  POCT urinalysis dipstick     Status: Abnormal   Collection Time: 11/26/23  7:05 PM  Result Value Ref Range   Color, UA orange (A) yellow   Clarity, UA clear clear   Glucose, UA negative negative mg/dL   Bilirubin, UA negative negative   Ketones, POC UA negative negative mg/dL   Spec Grav, UA <=6.295 (A) 1.010 - 1.025    Blood, UA trace-intact (A) negative   pH, UA 5.0 5.0 - 8.0   Protein Ur, POC negative negative mg/dL   Urobilinogen, UA 0.2 0.2 or 1.0 E.U./dL   Nitrite, UA Positive (A) Negative   Leukocytes, UA Trace (A) Negative    Assessment and Plan :   PDMP not reviewed this encounter.  1. Acute cystitis with hematuria    Start ciprofloxacin to cover for acute cystitis, urine culture pending.  Advised patient to request a consult and establish care with and urologist through her PCP.  Recommended aggressive hydration, limiting urinary irritants. Counseled patient on potential for adverse effects with medications prescribed/recommended today, ER and return-to-clinic precautions discussed, patient verbalized understanding.    Wallis Bamberg, [place] 11/26/23 2841

## 2023-11-26 NOTE — ED Triage Notes (Signed)
 Pt c/o urinary freq-had an evist-completed macrobid 5 days of med-states her baseline normally requires 10 days-NAD-steady gait

## 2023-11-26 NOTE — Discharge Instructions (Signed)
 Please start ciprofloxacin to address an urinary tract infection. Make sure you hydrate very well with plain water and a quantity of 80 ounces of water a day.  Please limit drinks that are considered urinary irritants such as soda, sweet tea, coffee, energy drinks, alcohol.  These can worsen your urinary and genital symptoms but also be the source of them.  I will let you know about your urine culture results through MyChart to see if we need to prescribe or change your antibiotics based off of those results.

## 2023-11-27 ENCOUNTER — Other Ambulatory Visit (HOSPITAL_COMMUNITY): Payer: Self-pay

## 2023-11-27 LAB — URINE CULTURE: Culture: <10000 — AB

## 2023-11-29 ENCOUNTER — Other Ambulatory Visit (HOSPITAL_COMMUNITY): Payer: Self-pay

## 2023-11-30 ENCOUNTER — Other Ambulatory Visit: Payer: Self-pay

## 2023-11-30 ENCOUNTER — Other Ambulatory Visit (HOSPITAL_COMMUNITY): Payer: Self-pay

## 2023-12-01 ENCOUNTER — Telehealth: Payer: Self-pay

## 2023-12-01 ENCOUNTER — Other Ambulatory Visit: Payer: Self-pay

## 2023-12-01 ENCOUNTER — Other Ambulatory Visit: Payer: Self-pay | Admitting: Family Medicine

## 2023-12-01 ENCOUNTER — Other Ambulatory Visit (HOSPITAL_COMMUNITY): Payer: Self-pay

## 2023-12-01 MED ORDER — VALACYCLOVIR HCL 1 G PO TABS
1.0000 g | ORAL_TABLET | Freq: Every day | ORAL | 1 refills | Status: AC | PRN
  Filled 2023-12-01: qty 90, 90d supply, fill #0

## 2023-12-01 NOTE — Telephone Encounter (Signed)
 Reviewed with Canary Brim, NP: As there is risk and in office appt is recommended to fully discuss.   Patient would like to keep the appt in the am with Brandi to review.

## 2023-12-01 NOTE — Telephone Encounter (Addendum)
 Alert remote transmission:   Atrial Arrhythmia Burden of at least 6.0 hours in a 24 hour period. 6 ATR's 4/7 for AF, longest duration 6hr , controlled rates, burden <1%, no OAC per EPIC - total of 8 hours of AF on 11/30/23.   Recent acute cystitis, is on antibiotics.  Also, reports being under significant life stress and pain with plans for hip replacement in 1 month. (Hx of DVT post PPM, lead extraction 02/2022).   Holding am spot with Canary Brim, NP to discuss [facility] options if appropriate. Forwarding for her review in Dr. Lubertha Basque absence.

## 2023-12-02 ENCOUNTER — Encounter: Admitting: Pulmonary Disease

## 2023-12-02 ENCOUNTER — Emergency Department (HOSPITAL_COMMUNITY)

## 2023-12-02 ENCOUNTER — Other Ambulatory Visit: Payer: Self-pay

## 2023-12-02 ENCOUNTER — Emergency Department (HOSPITAL_COMMUNITY)
Admission: EM | Admit: 2023-12-02 | Discharge: 2023-12-02 | Discharge status: Against Medical Advice | Attending: Emergency Medicine | Admitting: Emergency Medicine

## 2023-12-02 DIAGNOSIS — I4891 Unspecified atrial fibrillation: Secondary | ICD-10-CM | POA: Diagnosis not present

## 2023-12-02 DIAGNOSIS — Z5321 Procedure and treatment not carried out due to patient leaving prior to being seen by health care provider: Secondary | ICD-10-CM | POA: Insufficient documentation

## 2023-12-02 DIAGNOSIS — R42 Dizziness and giddiness: Secondary | ICD-10-CM | POA: Diagnosis not present

## 2023-12-02 DIAGNOSIS — R519 Headache, unspecified: Secondary | ICD-10-CM | POA: Insufficient documentation

## 2023-12-02 DIAGNOSIS — R531 Weakness: Secondary | ICD-10-CM | POA: Diagnosis not present

## 2023-12-02 LAB — CBC WITH DIFFERENTIAL/PLATELET
Abs Immature Granulocytes: 0.01 10*3/uL (ref 0.00–0.07)
Basophils Absolute: 0 10*3/uL (ref 0.0–0.1)
Basophils Relative: 0 %
Eosinophils Absolute: 0.1 10*3/uL (ref 0.0–0.5)
Eosinophils Relative: 2 %
HCT: 40.4 % (ref 36.0–46.0)
Hemoglobin: 13.4 g/dL (ref 12.0–15.0)
Immature Granulocytes: 0 %
Lymphocytes Relative: 36 %
Lymphs Abs: 1.6 10*3/uL (ref 0.7–4.0)
MCH: 31.4 pg (ref 26.0–34.0)
MCHC: 33.2 g/dL (ref 30.0–36.0)
MCV: 94.6 fL (ref 80.0–100.0)
Monocytes Absolute: 0.6 10*3/uL (ref 0.1–1.0)
Monocytes Relative: 13 %
Neutro Abs: 2.2 10*3/uL (ref 1.7–7.7)
Neutrophils Relative %: 49 %
Platelets: 233 10*3/uL (ref 150–400)
RBC: 4.27 MIL/uL (ref 3.87–5.11)
RDW: 13.5 % (ref 11.5–15.5)
WBC: 4.5 10*3/uL (ref 4.0–10.5)
nRBC: 0 % (ref 0.0–0.2)

## 2023-12-02 LAB — I-STAT CHEM 8, ED
BUN: 14 mg/dL (ref 8–23)
Calcium, Ion: 1.14 mmol/L — ABNORMAL LOW (ref 1.15–1.40)
Chloride: 101 mmol/L (ref 98–111)
Creatinine, Ser: 0.8 mg/dL (ref 0.44–1.00)
Glucose, Bld: 117 mg/dL — ABNORMAL HIGH (ref 70–99)
HCT: 42 % (ref 36.0–46.0)
Hemoglobin: 14.3 g/dL (ref 12.0–15.0)
Potassium: 3.8 mmol/L (ref 3.5–5.1)
Sodium: 137 mmol/L (ref 135–145)
TCO2: 26 mmol/L (ref 22–32)

## 2023-12-02 LAB — BASIC METABOLIC PANEL WITH GFR
Anion gap: 11 (ref 5–15)
BUN: 13 mg/dL (ref 8–23)
CO2: 25 mmol/L (ref 22–32)
Calcium: 9.3 mg/dL (ref 8.9–10.3)
Chloride: 99 mmol/L (ref 98–111)
Creatinine, Ser: 0.71 mg/dL (ref 0.44–1.00)
GFR, Estimated: >60 mL/min (ref >60)
Glucose, Bld: 118 mg/dL — ABNORMAL HIGH (ref 70–99)
Potassium: 3.7 mmol/L (ref 3.5–5.1)
Sodium: 135 mmol/L (ref 135–145)

## 2023-12-02 LAB — CBG MONITORING, ED: Glucose-Capillary: 111 mg/dL — ABNORMAL HIGH (ref 70–99)

## 2023-12-02 NOTE — ED Notes (Signed)
 Pt. Stated "I will follow up with my doctor". Pt. Moved OTF

## 2023-12-02 NOTE — Progress Notes (Unsigned)
 Electrophysiology Office Note:   Date:  12/02/2023  ID:  Kari [name], DOB Apr 04, 1951, MRN [id number]  Primary Cardiologist: Lewayne Bunting, MD Primary Heart Failure: None Electrophysiologist: Lewayne Bunting, MD   {Click to update primary MD,subspecialty MD or APP then REFRESH:1}    History of Present Illness:   Kari [name] is a 73 y.o. 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    The [facility] was alerted on 12/01/23 of an episode of atrial arrhythmia burden - lasting 6 hours in a 24 hour period on 11/30/23.  Longest duration 6h 20 minutes, controlled ventricular rates, <1% burden.  Total of 8 hours of AF on 11/30/23.  Patient was notified and she indicated she had a recent acute cystitis and is on antibiotics, has significant life stress & pain in setting of pending hip replacement. She was held a spot in clinic for 4/9 visit but woke that day and "felt spacey & lightheaded" and reported to the ER for evaluation.  It appears by documentation she left AMA.   Since discharge from hospital the patient reports doing ***.  she denies chest pain, palpitations, dyspnea, PND, orthopnea, nausea, vomiting, dizziness, syncope, edema, weight gain, or early satiety.   Review of systems complete and found to be negative unless listed in HPI.   EP Information / Studies Reviewed:    EKG is ordered today. Personal review as below.      PPM Interrogation-  reviewed in detail today,  See PACEART report.  Device History: Field seismologist PPM implanted 03/24/22 for CHB Hx of dual chamber PPM s/p extraction due to noise on ventricular lead resulting in failure to pace, near syncope / syncope.  Complicated by LUE DVT, treated with xarelto  Risk Assessment/Calculations:    CHA2DS2-VASc Score = 3  {Confirm score is correct.  If not, click here to update score.  REFRESH note.  :1} This indicates a  3.2% annual risk of stroke. The patient's score is based upon: CHF History: 0 HTN History: 1 Diabetes History: 0 Stroke History: 0 Vascular Disease History: 0 Age Score: 1 Gender Score: 1   {This patient has a significant risk of stroke if diagnosed with atrial fibrillation.  Please consider VKA or DOAC agent for anticoagulation if the bleeding risk is acceptable.   You can also use the SmartPhrase .[email or web address] for documentation.   :[id number]}          Physical Exam:   VS:  There were no vitals taken for this visit.   Wt Readings from Last 3 Encounters:  12/02/23 150 lb (68 kg)  11/11/23 155 lb (70.3 kg)  10/14/23 150 lb (68 kg)     GEN: Well nourished, well developed in no acute distress NECK: No JVD; No carotid bruits CARDIAC: {EPRHYTHM:28826}, no murmurs, rubs, gallops RESPIRATORY:  Clear to auscultation without rales, wheezing or rhonchi  ABDOMEN: Soft, non-tender, non-distended EXTREMITIES:  No edema; No deformity   ASSESSMENT AND PLAN:    CHB s/p [employer] PPM  -Normal PPM function -See Pace Art report -No changes today  Device Detected Atrial Fibrillation  CHA2DS2-VASc 3  -new onset 11/30/23, 8 hours total  -recent illness, on abx -OAC discussed with patient ***  Hypertension  -well controlled on current regimen ***   Disposition:   Follow up with Dr. Ladona Ridgel {EPFOLLOW NW:29562}  Signed, Canary Brim, NP-C, AGACNP-BC  [facility] -  Electrophysiology  12/02/2023, 8:21 PM

## 2023-12-02 NOTE — ED Triage Notes (Signed)
 Pt. Stated, I have a Pacer about my AV Node and around 200 in morning I went into A-Fib, but I felt fine . This morning I feel light headed and just dont feel good.

## 2023-12-02 NOTE — ED Provider Triage Note (Signed)
 Emergency Medicine Provider Triage Evaluation Note  Kari [name] , a 73 y.o. female  was evaluated in triage.  Pt complains of feeling off.  She was told by cardiology over the phone that she was in A-fib on 4/7 though she did not feel it.  Felt fine yesterday, this morning has felt off and spacey and lightheaded.  Mild on and off headache but no focal weakness.  No chest pain.  Review of Systems  Positive: Lightheaded, mild headache Negative: Focal weakness, numbness, chest pain, shortness of breath  Physical Exam  BP (!) 174/89   Pulse 75   Temp 98.1 F (36.7 C)   Resp 18   Ht 5\' 8"  (1.727 m)   Wt 68 kg   SpO2 98%   BMI 22.81 kg/m  Gen:   Awake, no distress   Resp:  Normal effort  MSK:   Moves extremities without difficulty, no weakness or drift.  Able to ambulate without ataxia Other:  Heart regular rate and rhythm  Medical Decision Making  Medically screening exam initiated at 9:31 AM.  Appropriate orders placed.  [place] M[place] was informed that the remainder of the evaluation will be completed by another provider, this initial triage assessment does not replace that evaluation, and the importance of remaining in the ED until their evaluation is complete.  Labs, pacemaker interrogation, chest x-ray and EKG.   Pricilla Loveless, MD 12/02/23 [phone]

## 2023-12-02 NOTE — ED Notes (Signed)
 Poison controlled called for information

## 2023-12-03 ENCOUNTER — Telehealth: Payer: Self-pay | Admitting: Cardiology

## 2023-12-03 ENCOUNTER — Encounter: Payer: Self-pay | Admitting: Pulmonary Disease

## 2023-12-03 ENCOUNTER — Other Ambulatory Visit (HOSPITAL_COMMUNITY): Payer: Self-pay

## 2023-12-03 ENCOUNTER — Ambulatory Visit: Attending: Pulmonary Disease | Admitting: Pulmonary Disease

## 2023-12-03 VITALS — BP 128/78 | HR 66 | Ht 68.0 in | Wt 150.0 lb

## 2023-12-03 DIAGNOSIS — I10 Essential (primary) hypertension: Secondary | ICD-10-CM | POA: Diagnosis not present

## 2023-12-03 DIAGNOSIS — I442 Atrioventricular block, complete: Secondary | ICD-10-CM

## 2023-12-03 DIAGNOSIS — R072 Precordial pain: Secondary | ICD-10-CM | POA: Diagnosis not present

## 2023-12-03 DIAGNOSIS — Z950 Presence of cardiac pacemaker: Secondary | ICD-10-CM | POA: Diagnosis not present

## 2023-12-03 LAB — CUP PACEART INCLINIC DEVICE CHECK
Date Time Interrogation Session: 20250410175518
Implantable Lead Connection Status: 753985
Implantable Lead Connection Status: 753985
Implantable Lead Implant Date: 20230731
Implantable Lead Implant Date: 20230731
Implantable Lead Location: 753859
Implantable Lead Location: 753860
Implantable Lead Model: 7841
Implantable Lead Model: 7842
Implantable Lead Serial Number: 1215821
Implantable Lead Serial Number: 1304218
Implantable Pulse Generator Implant Date: 20230731
Lead Channel Impedance Value: 777 Ohm
Lead Channel Impedance Value: 829 Ohm
Lead Channel Pacing Threshold Amplitude: 0.5 V
Lead Channel Pacing Threshold Amplitude: 0.9 V
Lead Channel Pacing Threshold Pulse Width: 0.4 ms
Lead Channel Pacing Threshold Pulse Width: 0.6 ms
Lead Channel Sensing Intrinsic Amplitude: 7.1 mV
Lead Channel Setting Pacing Amplitude: 2 V
Lead Channel Setting Pacing Amplitude: 2.5 V
Lead Channel Setting Pacing Pulse Width: 0.6 ms
Lead Channel Setting Sensing Sensitivity: 4 mV
Pulse Gen Serial Number: 600527
Zone Setting Status: 755011

## 2023-12-03 MED ORDER — RIVAROXABAN 20 MG PO TABS
20.0000 mg | ORAL_TABLET | Freq: Every day | ORAL | 11 refills | Status: DC
  Filled 2023-12-03: qty 30, 30d supply, fill #0

## 2023-12-03 MED ORDER — METOPROLOL TARTRATE 100 MG PO TABS
100.0000 mg | ORAL_TABLET | Freq: Once | ORAL | 0 refills | Status: DC
  Filled 2023-12-03: qty 1, 1d supply, fill #0

## 2023-12-03 MED ORDER — RIVAROXABAN 20 MG PO TABS: 20.0000 mg | ORAL_TABLET | Freq: Every day | ORAL | 0 refills | Status: DC

## 2023-12-03 NOTE — Patient Instructions (Addendum)
 Medication Instructions:  1.Start Xarelto 20 mg daily *If you need a refill on your cardiac medications before your next appointment, please call your pharmacy*  DAY OF CORONARY CTA SCAN: HOLD Metoprolol Succinate (Toprol-XL) and take your ONCE TIME DOSE of Metoprolol Tartrate (Lopressor) 100 mg 2 hours before scan. Restart your Metoprolol Succinate (Toprol-XL) the day after the scan.  Lab Work:  If you have labs (blood work) drawn today and your tests are completely normal, you will receive your results only by: MyChart Message (if you have MyChart) OR A paper copy in the mail If you have any lab test that is abnormal or we need to change your treatment, we will call you to review the results.  Testing/Procedures: Your physician has requested that you have cardiac CTA. Cardiac computed tomography (CT) is a painless test that uses an x-ray machine to take clear, detailed pictures of your heart. For further information please visit [email or web address] Please follow instruction sheet as given.    Follow-Up: At [facility], you and your health needs are our priority.  As part of our continuing mission to provide you with exceptional heart care, our providers are all part of one team.  This team includes your primary Cardiologist (physician) and Advanced Practice Providers or APPs (Physician Assistants and Nurse Practitioners) who all work together to provide you with the care you need, when you need it.  Your next appointment:   Next available  Provider:   Nobie Putnam, MD to discuss ablation and watchman  ---------------------------------------------------------------------------   Your cardiac CT will be scheduled at one of the below locations:   [facility] [address] [place], [place] [address] [phone]  If scheduled at [facility], please arrive at the [facility] and Children's Entrance (Entrance C2) of [facility] 30 minutes prior  to test start time. You can use the FREE valet parking offered at entrance C (encouraged to control the heart rate for the test)  Proceed to the [facility] Radiology Department (first floor) to check-in and test prep.  All radiology patients and guests should use entrance C2 at [facility], accessed from [facility], even though the hospital's physical address listed is [address]     Please follow these instructions carefully (unless otherwise directed):  An IV will be required for this test and Nitroglycerin will be given.   On the Night Before the Test: Be sure to Drink plenty of water. Do not consume any caffeinated/decaffeinated beverages or chocolate 12 hours prior to your test. Do not take any antihistamines 12 hours prior to your test.  On the Day of the Test: Drink plenty of water until 1 hour prior to the test. Do not eat any food 1 hour prior to test. You may take your regular medications prior to the test.  Take metoprolol (Lopressor) two hours prior to test. FEMALES- please wear underwire-free bra if available, avoid dresses & tight clothing      After the Test: Drink plenty of water. After receiving IV contrast, you may experience a mild flushed feeling. This is normal. On occasion, you may experience a mild rash up to 24 hours after the test. This is not dangerous. If this occurs, you can take Benadryl 25 mg and increase your fluid intake. If you experience trouble breathing, this can be serious. If it is severe call 911 IMMEDIATELY. If it is mild, please call our office.  We will call to schedule your  test 2-4 weeks out understanding that some insurance companies will need an authorization prior to the service being performed.   For more information and frequently asked questions, please visit our website : [email or web address]  For non-scheduling related questions, please contact the cardiac imaging nurse navigator  should you have any questions/concerns: Cardiac Imaging Nurse Navigators Direct Office Dial: [phone]   For scheduling needs, including cancellations and rescheduling, please call [place], [phone].     1st Floor: - Lobby - Registration  - Pharmacy  - Lab - Cafe  2nd Floor: - PV Lab - Diagnostic Testing (echo, CT, nuclear med)  3rd Floor: - Vacant  4th Floor: - TCTS (cardiothoracic surgery) - AFib Clinic - Structural Heart Clinic - Vascular Surgery  - Vascular Ultrasound  5th Floor: - HeartCare Cardiology (general and EP) - Clinical Pharmacy for coumadin, hypertension, lipid, weight-loss medications, and med management appointments    Valet parking services will be available as well.

## 2023-12-03 NOTE — Telephone Encounter (Signed)
 Patient called in reporting she was started on Xarelto 20mg  daily for atrial fibrillation today. Took initial dose at 2pm and several hours later noted a small amount of bright red blood in the right nare. No true epistasis and no further bleeding. I advised she could use Afrin as needed if she developed bleeding. Given no true epistasis or other bleeding issues at this time, strongly advise to continue on Xarelto for stroke risk reduction with elevated ChadsVasc score. She was agreeable to do so. Advised I would route to ordering provider regarding her call/concerns and she will update up [place] if there are any recurrent issues.

## 2023-12-06 ENCOUNTER — Encounter: Payer: Self-pay | Admitting: Family Medicine

## 2023-12-07 ENCOUNTER — Other Ambulatory Visit (HOSPITAL_BASED_OUTPATIENT_CLINIC_OR_DEPARTMENT_OTHER): Payer: Self-pay

## 2023-12-07 ENCOUNTER — Other Ambulatory Visit (HOSPITAL_COMMUNITY): Payer: Self-pay

## 2023-12-07 ENCOUNTER — Telehealth: Payer: Self-pay | Admitting: Orthopaedic Surgery

## 2023-12-07 NOTE — Telephone Encounter (Signed)
 Patient called. Says she is on a blood thinner. Would like to know when she should stop taking it before surgery. [phone]

## 2023-12-09 ENCOUNTER — Emergency Department (HOSPITAL_BASED_OUTPATIENT_CLINIC_OR_DEPARTMENT_OTHER)
Admission: EM | Admit: 2023-12-09 | Discharge: 2023-12-09 | Disposition: A | Discharge status: Home / Self Care | Attending: Emergency Medicine | Admitting: Emergency Medicine

## 2023-12-09 ENCOUNTER — Other Ambulatory Visit: Payer: Self-pay

## 2023-12-09 DIAGNOSIS — R102 Pelvic and perineal pain: Secondary | ICD-10-CM | POA: Diagnosis present

## 2023-12-09 DIAGNOSIS — N3090 Cystitis, unspecified without hematuria: Secondary | ICD-10-CM | POA: Insufficient documentation

## 2023-12-09 LAB — BASIC METABOLIC PANEL WITH GFR
Anion gap: 9 (ref 5–15)
BUN: 9 mg/dL (ref 8–23)
CO2: 25 mmol/L (ref 22–32)
Calcium: 10.1 mg/dL (ref 8.9–10.3)
Chloride: 103 mmol/L (ref 98–111)
Creatinine, Ser: 0.64 mg/dL (ref 0.44–1.00)
GFR, Estimated: >60 mL/min (ref >60)
Glucose, Bld: 96 mg/dL (ref 70–99)
Potassium: 3.7 mmol/L (ref 3.5–5.1)
Sodium: 137 mmol/L (ref 135–145)

## 2023-12-09 LAB — URINALYSIS, ROUTINE W REFLEX MICROSCOPIC
Bilirubin Urine: NEGATIVE
Glucose, UA: NEGATIVE mg/dL
Hgb urine dipstick: NEGATIVE
Ketones, ur: NEGATIVE mg/dL
Leukocytes,Ua: NEGATIVE
Nitrite: NEGATIVE
Protein, ur: NEGATIVE mg/dL
Specific Gravity, Urine: <1.005 — ABNORMAL LOW (ref 1.005–1.030)
pH: 6 (ref 5.0–8.0)

## 2023-12-09 LAB — CBC
HCT: 37.8 % (ref 36.0–46.0)
Hemoglobin: 12.9 g/dL (ref 12.0–15.0)
MCH: 31.5 pg (ref 26.0–34.0)
MCHC: 34.1 g/dL (ref 30.0–36.0)
MCV: 92.4 fL (ref 80.0–100.0)
Platelets: 240 10*3/uL (ref 150–400)
RBC: 4.09 MIL/uL (ref 3.87–5.11)
RDW: 13.3 % (ref 11.5–15.5)
WBC: 6.6 10*3/uL (ref 4.0–10.5)
nRBC: 0 % (ref 0.0–0.2)

## 2023-12-09 MED ORDER — HYDROXYZINE HCL 25 MG PO TABS: 25.0000 mg | ORAL_TABLET | Freq: Three times a day (TID) | ORAL | 0 refills | Status: DC | PRN

## 2023-12-09 MED ORDER — PHENAZOPYRIDINE HCL 200 MG PO TABS: 200.0000 mg | ORAL_TABLET | Freq: Three times a day (TID) | ORAL | 0 refills | Status: DC

## 2023-12-09 MED ORDER — CEFPODOXIME PROXETIL 200 MG PO TABS: 200.0000 mg | ORAL_TABLET | Freq: Two times a day (BID) | ORAL | 0 refills | Status: AC

## 2023-12-09 NOTE — ED Provider Notes (Signed)
 [place] EMERGENCY DEPARTMENT AT [facility] Provider Note   CSN: [id number] Arrival date & time: 12/09/23  1752     History No chief complaint on file.   HPI Kari [name] is a 73 y.o. female presenting for chief complaint of pelvic pain, burning and fatigue. Some severe urge for 10 days  Patient's recorded medical, surgical, social, medication list and allergies were reviewed in the Snapshot window as part of the initial history.   Review of Systems   Review of Systems  Constitutional:  Negative for chills and fever.  HENT:  Negative for ear pain and sore throat.   Eyes:  Negative for pain and visual disturbance.  Respiratory:  Negative for cough and shortness of breath.   Cardiovascular:  Negative for chest pain and palpitations.  Gastrointestinal:  Negative for abdominal pain and vomiting.  Genitourinary:  Positive for pelvic pain and urgency. Negative for dysuria and hematuria.  Musculoskeletal:  Negative for arthralgias and back pain.  Skin:  Negative for color change and rash.  Neurological:  Negative for seizures and syncope.  All other systems reviewed and are negative.   Physical Exam Updated Vital Signs BP (!) 161/83 (BP Location: Right Arm)   Pulse 77   Temp 98 F (36.7 C) (Oral)   Resp 16   SpO2 96%  Physical Exam Vitals and nursing note reviewed.  Constitutional:      General: She is not in acute distress.    Appearance: She is well-developed.  HENT:     Head: Normocephalic and atraumatic.  Eyes:     Conjunctiva/sclera: Conjunctivae normal.  Cardiovascular:     Rate and Rhythm: Normal rate and regular rhythm.     Heart sounds: No murmur heard. Pulmonary:     Effort: Pulmonary effort is normal. No respiratory distress.     Breath sounds: Normal breath sounds.  Abdominal:     General: There is no distension.     Palpations: Abdomen is soft.     Tenderness: There is no abdominal tenderness. There is no right CVA tenderness or left  CVA tenderness.  Musculoskeletal:        General: No swelling or tenderness. Normal range of motion.     Cervical back: Neck supple.  Skin:    General: Skin is warm and dry.  Neurological:     General: No focal deficit present.     Mental Status: She is alert and oriented to person, place, and time. Mental status is at baseline.     Cranial Nerves: No cranial nerve deficit.      ED Course/ Medical Decision Making/ A&P    Procedures Procedures   Medications Ordered in ED Medications - No data to display  Medical Decision Making:   Kari [name] is a 73 y.o. female who presented to the ED today with chief complaint of urinary discomfort detailed above.    Complete initial physical exam performed, notably the patient  was hemodynamically stable no acute distress.    Reviewed and confirmed nursing documentation for past medical history, family history, social history.    Initial Assessment:   With the patient's presentation of urinary discomfort, most likely diagnosis is infectious cystitis failing outpatient therapy versus interstitial cystitis. Other diagnoses were considered including (but not limited to) pelvic floor dysfunction. These are considered less likely due to history of present illness and physical exam findings.   This is most consistent with an acute life/limb threatening illness complicated by underlying chronic conditions.  Initial Plan:  Screening labs including CBC and Metabolic panel to evaluate for infectious or metabolic etiology of disease.  Urinalysis with reflex culture ordered to evaluate for UTI or relevant urologic/nephrologic pathology.  Objective evaluation as below reviewed   Initial Study Results:   Laboratory  All laboratory results reviewed without evidence of clinically relevant pathology.     EKG EKG was reviewed independently. Rate, rhythm, axis, intervals all examined and without medically relevant abnormality. ST segments without  concerns for elevations.    Radiology:  All images reviewed independently. Agree with radiology report at this time.   CUP PACEART INCLINIC DEVICE CHECK Result Date: 12/03/2023 Normal in-clinic dual chamber pacemaker check. Presenting Rhythm: ASVP. Routine testing of thresholds, sensing, and impedance demonstrate stable parameters and no programming changes needed at this time. Episodes of AF on 4/8 in a cluster totaling 8.6 hours of AF.  OAC started at visit.  Estimated longevity 11 years . Pt enrolled in remote follow-up. bo  DG Chest 2 View Result Date: 12/02/2023 CLINICAL DATA:  Weakness and dizziness. EXAM: CHEST - 2 VIEW COMPARISON:  Chest radiograph dated 11/11/2023. FINDINGS: No focal consolidation, pleural effusion, pneumothorax. The cardiac silhouette is within normal limits. Left pectoral pacemaker device. No acute osseous pathology. IMPRESSION: No active cardiopulmonary disease. Electronically Signed   By: Angus Bark M.D.   On: 12/02/2023 11:31   DG Chest 2 View Result Date: 11/11/2023 CLINICAL DATA:  Chest pain EXAM: CHEST - 2 VIEW COMPARISON:  03/20/2023 FINDINGS: Left chest wall pacemaker. Normal cardiomediastinal silhouette. No focal consolidation, pleural effusion, or pneumothorax. No displaced rib fractures. IMPRESSION: No active cardiopulmonary disease. Electronically Signed   By: Rozell Cornet M.D.   On: 11/11/2023 03:24   XR HIP UNILAT W OR W/O PELVIS 2-3 VIEWS LEFT Result Date: 11/10/2023 Advanced degenerative joint disease with bone-on-bone joint space narrowing of the left hip joint   Reassessment and Plan:   Patient's UA likely sterilized from Macrobid, however she is having severe symptoms of burning on urination, urge incontinence and discomfort.  I question whether this is ongoing infection or developing interstitial cystitis in the postinfectious window. Her symptoms are severe, she is unable to continue working at the level she wants to as she is running to the  bathroom frequently, she states relatively severe discomfort when she does use the bathroom. She also endorses fever fatigue malaise and delirium where she is feeling confused. Her clinical syndrome seems most consistent with ongoing infection though it is difficult to interpret her UA as she has been on Macrobid for such a protracted amount of time.  Shared medical decision making with patient, she would like to trial a different bacteriocidal rather than Macrobid given failure of symptom improvement. Will also treat for interstitial cystitis with hydroxyzine and Pyridium and provide referral to urology for further diagnostic care and management.  Urine culture collected, patient ambulatory tolerating p.o. intake in no acute distress on the serial reassessment.  She feels comfort with outpatient care management.  Disposition:  I have considered need for hospitalization, however, considering all of the above, I believe this patient is stable for discharge at this time.  Patient/family educated about specific return precautions for given chief complaint and symptoms.  Patient/family educated about follow-up with PCP.     Patient/family expressed understanding of return precautions and need for follow-up. Patient spoken to regarding all imaging and laboratory results and appropriate follow up for these results. All education provided in verbal form with additional information in  written form. Time was allowed for answering of patient questions. Patient discharged.    Emergency Department Medication Summary:   Medications - No data to display       Clinical Impression:  1. Cystitis      Discharge   Final Clinical Impression(s) / ED Diagnoses Final diagnoses:  Cystitis    Rx / DC Orders ED Discharge Orders          Ordered    Ambulatory referral to Urology        12/09/23 1957    phenazopyridine (PYRIDIUM) 200 MG tablet  3 times daily        12/09/23 2006    hydrOXYzine (ATARAX) 25  MG tablet  Every 8 hours PRN        12/09/23 2006    cefpodoxime (VANTIN) 200 MG tablet  2 times daily        12/09/23 2006              Onetha Bile, MD 12/09/23 2320

## 2023-12-09 NOTE — ED Triage Notes (Signed)
 Chills, fatigue, Finished Cipro-taking macrobid. Continued polyuria and dysuria. Afebrile. -n/-v/+d.

## 2023-12-11 LAB — URINE CULTURE: Culture: NO GROWTH

## 2023-12-15 ENCOUNTER — Other Ambulatory Visit (HOSPITAL_COMMUNITY): Payer: Self-pay

## 2023-12-18 ENCOUNTER — Other Ambulatory Visit: Payer: Self-pay

## 2023-12-18 ENCOUNTER — Emergency Department (HOSPITAL_COMMUNITY)

## 2023-12-18 ENCOUNTER — Emergency Department (HOSPITAL_COMMUNITY)
Admission: EM | Admit: 2023-12-18 | Discharge: 2023-12-18 | Disposition: A | Discharge status: Home / Self Care | Attending: Emergency Medicine | Admitting: Emergency Medicine

## 2023-12-18 DIAGNOSIS — R29818 Other symptoms and signs involving the nervous system: Secondary | ICD-10-CM | POA: Diagnosis not present

## 2023-12-18 DIAGNOSIS — R002 Palpitations: Secondary | ICD-10-CM | POA: Insufficient documentation

## 2023-12-18 DIAGNOSIS — R42 Dizziness and giddiness: Secondary | ICD-10-CM | POA: Insufficient documentation

## 2023-12-18 DIAGNOSIS — Z7901 Long term (current) use of anticoagulants: Secondary | ICD-10-CM | POA: Diagnosis not present

## 2023-12-18 LAB — CBC WITH DIFFERENTIAL/PLATELET
Abs Immature Granulocytes: 0.01 10*3/uL (ref 0.00–0.07)
Basophils Absolute: 0 10*3/uL (ref 0.0–0.1)
Basophils Relative: 1 %
Eosinophils Absolute: 0.1 10*3/uL (ref 0.0–0.5)
Eosinophils Relative: 1 %
HCT: 40 % (ref 36.0–46.0)
Hemoglobin: 13 g/dL (ref 12.0–15.0)
Immature Granulocytes: 0 %
Lymphocytes Relative: 26 %
Lymphs Abs: 1.4 10*3/uL (ref 0.7–4.0)
MCH: 31 pg (ref 26.0–34.0)
MCHC: 32.5 g/dL (ref 30.0–36.0)
MCV: 95.5 fL (ref 80.0–100.0)
Monocytes Absolute: 0.6 10*3/uL (ref 0.1–1.0)
Monocytes Relative: 11 %
Neutro Abs: 3.3 10*3/uL (ref 1.7–7.7)
Neutrophils Relative %: 61 %
Platelets: 244 10*3/uL (ref 150–400)
RBC: 4.19 MIL/uL (ref 3.87–5.11)
RDW: 13.6 % (ref 11.5–15.5)
WBC: 5.4 10*3/uL (ref 4.0–10.5)
nRBC: 0 % (ref 0.0–0.2)

## 2023-12-18 LAB — COMPREHENSIVE METABOLIC PANEL WITH GFR
ALT: 16 U/L (ref 0–44)
AST: 22 U/L (ref 15–41)
Albumin: 3.8 g/dL (ref 3.5–5.0)
Alkaline Phosphatase: 56 U/L (ref 38–126)
Anion gap: 9 (ref 5–15)
BUN: 12 mg/dL (ref 8–23)
CO2: 25 mmol/L (ref 22–32)
Calcium: 9.5 mg/dL (ref 8.9–10.3)
Chloride: 102 mmol/L (ref 98–111)
Creatinine, Ser: 0.72 mg/dL (ref 0.44–1.00)
GFR, Estimated: >60 mL/min (ref >60)
Glucose, Bld: 124 mg/dL — ABNORMAL HIGH (ref 70–99)
Potassium: 4.2 mmol/L (ref 3.5–5.1)
Sodium: 136 mmol/L (ref 135–145)
Total Bilirubin: 1 mg/dL (ref 0.0–1.2)
Total Protein: 7.4 g/dL (ref 6.5–8.1)

## 2023-12-18 LAB — URINALYSIS, ROUTINE W REFLEX MICROSCOPIC
Bilirubin Urine: NEGATIVE
Glucose, UA: NEGATIVE mg/dL
Hgb urine dipstick: NEGATIVE
Ketones, ur: NEGATIVE mg/dL
Leukocytes,Ua: NEGATIVE
Nitrite: NEGATIVE
Protein, ur: NEGATIVE mg/dL
Specific Gravity, Urine: 1.002 — ABNORMAL LOW (ref 1.005–1.030)
pH: 5 (ref 5.0–8.0)

## 2023-12-18 LAB — CBG MONITORING, ED: Glucose-Capillary: 139 mg/dL — ABNORMAL HIGH (ref 70–99)

## 2023-12-18 LAB — BRAIN NATRIURETIC PEPTIDE: B Natriuretic Peptide: 26.8 pg/mL (ref 0.0–100.0)

## 2023-12-18 LAB — TROPONIN I (HIGH SENSITIVITY)
Troponin I (High Sensitivity): 4 ng/L (ref <18)
Troponin I (High Sensitivity): 4 ng/L (ref <18)

## 2023-12-18 LAB — TSH: TSH: 1.586 u[IU]/mL (ref 0.350–4.500)

## 2023-12-18 NOTE — ED Provider Notes (Signed)
 [place] EMERGENCY DEPARTMENT AT [facility] Provider Note   CSN: [id number] Arrival date & time: 12/18/23  5638     History  Chief Complaint  Patient presents with   Shaking   "spacey"   Headache    Kari [name] is a 73 y.o. female.   Headache    Patient has a history of heart block, permanent pacemaker, back pain, diverticulitis, DVT.  Patient presents to the ED with complaints of episodes of feeling lightheaded palpitations in the center of her chest feeling diaphoretic.  Patient states these episodes are coming and going.  She was recently diagnosed with atrial fibrillation based on interrogation of her pacemaker she had not noticed any symptoms.  She was started on Xarelto .  Patient states last week she had some symptoms of UTI but she did have some urgent burning at that time.  She was treated with antibiotics.  Today the patient started having the same symptoms again.  She came to the ED for further evaluation.  She is not having trouble with her speech.  No headaches.  No vomiting or diarrhea.  No fevers or chills  Home Medications Prior to Admission medications   Medication Sig Start Date End Date Taking? Authorizing Provider  B Complex-C (SUPER B COMPLEX PO) Take 2 capsules by mouth every evening.    [provider]  Calcium -Magnesium -Vitamin D  (CITRACAL CALCIUM +D) 600-40-500 MG-MG-UNIT TB24 Take 1 tablet by mouth every evening.    [provider]  Cholecalciferol  (VITAMIN D3) 125 MCG (5000 UT) CAPS Take 5,000 Units by mouth every evening.    [provider]  Coenzyme Q10 (COQ10) 200 MG CAPS Take 200 mg by mouth every evening.    [provider]  Cyanocobalamin  (VITAMIN B-12 SL) Take 1 tablet by mouth every evening.    [provider]  diclofenac  sodium (VOLTAREN ) 1 % GEL Apply 1-2 g topically 4 (four) times daily as needed (knee pain.).    [provider]  EPINEPHrine  0.3 mg/0.3 mL IJ SOAJ injection  Inject 0.3 mg into the muscle as needed for anaphylaxis. 07/31/23   Brent Butter, PA  esomeprazole (NEXIUM) 20 MG capsule Take 20 mg by mouth daily in the afternoon.    [provider]  fluticasone  (FLONASE ) 50 MCG/ACT nasal spray Place 1 spray into both nostrils daily as needed for allergies or rhinitis.    [provider]  hydrOXYzine  (ATARAX ) 25 MG tablet Take 1 tablet (25 mg total) by mouth every 8 (eight) hours as needed (Bladder spasms). Patient not taking: Reported on 12/16/2023 12/09/23   Onetha Bile, MD  levothyroxine  (SYNTHROID ) 25 MCG tablet Take 1 tablet (25 mcg total) by mouth daily before breakfast. 04/24/23     metoprolol  succinate (TOPROL  XL) 25 MG 24 hr tablet Take 1 tablet (25 mg total) by mouth daily. 11/11/23   Tammie Fall, MD  metoprolol  tartrate (LOPRESSOR ) 100 MG tablet Take 1 tablet (100 mg total) by mouth once for 1 dose. Take 90-120 minutes prior to scan. Hold for SBP less than 110. 12/03/23 12/04/23  Thomasena Fleming, NP  Multiple Vitamin (MULTIVITAMIN WITH MINERALS) TABS Take 1 tablet by mouth every evening. Women 50+    [provider]  Multiple Vitamins-Minerals (AIRBORNE GUMMIES PO) Take 1 Dose by mouth daily. Nature Made Wellblends ImmuneMAX    [provider]  Omega-3 Fatty Acids (FISH OIL  ULTRA PO) Take 2,400 mg by mouth every evening.    [provider]  phenazopyridine  (  PYRIDIUM ) 200 MG tablet Take 1 tablet (200 mg total) by mouth 3 (three) times daily. 12/09/23   Onetha Bile, MD  Probiotic Product (PROBIOTIC FORMULA PO) Take 1 capsule by mouth every evening.    [provider]  rivaroxaban  (XARELTO ) 20 MG TABS tablet Take 1 tablet (20 mg total) by mouth daily with supper. Patient not taking: Reported on 12/16/2023 12/03/23   Thomasena Fleming, NP  rivaroxaban  (XARELTO ) 20 MG TABS tablet Take 1 tablet (20 mg total) by mouth daily with supper. Patient taking differently: Take 20 mg by mouth daily at 4 PM.  12/03/23   Thomasena Fleming, NP  rosuvastatin  (CRESTOR ) 5 MG tablet Take 1 tablet (5 mg total) by mouth daily. 10/26/23   Jobe Mulder, DO  valACYclovir  (VALTREX ) 1000 MG tablet Take 1 tablet (1,000 mg total) by mouth daily as needed. 12/01/23   Jobe Mulder, DO      Allergies    Eliquis  [apixaban ], Bactrim  [sulfamethoxazole -trimethoprim ], Ceftin, Vancomycin , Doxycycline , and Levofloxacin    Review of Systems   Review of Systems  Neurological:  Positive for headaches.    Physical Exam Updated Vital Signs Temp 98.3 F (36.8 C)  Physical Exam Vitals and nursing note reviewed.  Constitutional:      General: She is not in acute distress.    Appearance: She is well-developed.  HENT:     Head: Normocephalic and atraumatic.     Right Ear: External ear normal.     Left Ear: External ear normal.  Eyes:     General: No visual field deficit or scleral icterus.       Right eye: No discharge.        Left eye: No discharge.     Conjunctiva/sclera: Conjunctivae normal.  Neck:     Trachea: No tracheal deviation.  Cardiovascular:     Rate and Rhythm: Normal rate and regular rhythm.  Pulmonary:     Effort: Pulmonary effort is normal. No respiratory distress.     Breath sounds: Normal breath sounds. No stridor. No wheezing or rales.  Abdominal:     General: Bowel sounds are normal. There is no distension.     Palpations: Abdomen is soft.     Tenderness: There is no abdominal tenderness. There is no guarding or rebound.  Musculoskeletal:        General: No tenderness.     Cervical back: Neck supple.  Skin:    General: Skin is warm and dry.     Findings: No rash.  Neurological:     Mental Status: She is alert and oriented to person, place, and time.     Cranial Nerves: No cranial nerve deficit, dysarthria or facial asymmetry.     Sensory: No sensory deficit.     Motor: No abnormal muscle tone, seizure activity or pronator drift.     Coordination: Coordination normal.      Comments:  able to hold both legs off bed for 5 seconds, sensation intact in all extremities,  no left or right sided neglect, normal finger-nose exam bilaterally, no nystagmus noted   Psychiatric:        Mood and Affect: Mood normal.     ED Results / Procedures / Treatments   Labs (all labs ordered are listed, but only abnormal results are displayed) Labs Reviewed  COMPREHENSIVE METABOLIC PANEL WITH GFR - Abnormal; Notable for the following components:      Result Value   Glucose, Bld 124 (*)  All other components within normal limits  URINALYSIS, ROUTINE W REFLEX MICROSCOPIC - Abnormal; Notable for the following components:   Color, Urine STRAW (*)    Specific Gravity, Urine 1.002 (*)    All other components within normal limits  CBG MONITORING, ED - Abnormal; Notable for the following components:   Glucose-Capillary 139 (*)    All other components within normal limits  CBC WITH DIFFERENTIAL/PLATELET  BRAIN NATRIURETIC PEPTIDE  TSH  TROPONIN I (HIGH SENSITIVITY)  TROPONIN I (HIGH SENSITIVITY)    EKG EKG Interpretation Date/Time:  Friday December 18 2023 10:00:13 EDT Ventricular Rate:  62 PR Interval:  212 QRS Duration:  154 QT Interval:  456 QTC Calculation: 462 R Axis:   110  Text Interpretation: Atrial-sensed ventricular-paced rhythm with prolonged AV conduction Abnormal ECG When compared with ECG of 02-Dec-2023 08:35, PREVIOUS ECG IS PRESENT Confirmed by Zackowski, Scott [id number]) on 12/18/2023 10:25:21 AM  Radiology CT Head Wo Contrast Result Date: 12/18/2023 CLINICAL DATA:  Neurologic deficit. EXAM: CT HEAD WITHOUT CONTRAST TECHNIQUE: Contiguous axial images were obtained from the base of the skull through the vertex without intravenous contrast. RADIATION DOSE REDUCTION: This exam was performed according to the departmental dose-optimization program which includes automated exposure control, adjustment of the mA and/or kV according to patient size and/or use of  iterative reconstruction technique. COMPARISON:  Head CT dated 06/08/2023. FINDINGS: Brain: The ventricles and sulci are appropriate size for the patient's age. The gray-white matter discrimination is preserved. There is no acute intracranial hemorrhage. No mass effect or midline shift. No extra-axial fluid collection. Vascular: No hyperdense vessel or unexpected calcification. Skull: Normal. Negative for fracture or focal lesion. Sinuses/Orbits: No acute finding. Other: None IMPRESSION: No acute intracranial pathology. Electronically Signed   By: Angus Bark M.D.   On: 12/18/2023 10:59    Procedures Procedures    Medications Ordered in ED Medications - No data to display  ED Course/ Medical Decision Making/ A&P Clinical Course as of 12/18/23 1445  Fri Dec 18, 2023  1228 Pt declines cxr.  Had one recently [JK]  1404 Troponin I (High Sensitivity) Second troponin normal [JK]  1404 CBC metabolic panel normal.  Urinalysis negative. [JK]  1404 Head CT without acute abnormality [JK]    Clinical Course User Index [JK] Trish Furl, MD                                 Medical Decision Making Problems Addressed: Lightheadedness: acute illness or injury that poses a threat to life or bodily functions Palpitations: acute illness or injury that poses a threat to life or bodily functions  Amount and/or Complexity of Data Reviewed Labs: ordered. Decision-making details documented in ED Course. Radiology: ordered and independent interpretation performed.   Patient presented to the ED for evaluation of generalized malaise associated with diaphoresis palpitations feeling lightheaded.  Patient has been having some issues with recurrent UTI.  She also recently started on Xarelto  for an episode of atrial fibrillation.  ED workup today reassuring.  No cardiac dysrhythmia noted.  Pacemaker was interrogated.  I reviewed the report and no events noted.  There are no signs of anemia or electrolyte  abnormalities.  Urinalysis does not suggest infection.  Her last urine culture was actually negative.  No acute findings noted on CT scan and she has a normal neurologic sign.  Etiology of symptoms unclear at this point.  Patient does have plans for a  cardiac CT this coming week.  At this point appears she is stable for discharge outpatient workup        Final Clinical Impression(s) / ED Diagnoses Final diagnoses:  Palpitations  Lightheadedness    Rx / DC Orders ED Discharge Orders     None         Trish Furl, MD 12/18/23 1445

## 2023-12-18 NOTE — ED Triage Notes (Signed)
 Pt. Stated, Kari [name] been here last week for the same symptoms. My cardiologist put me on Xeralto 2 weeks ago.Im not sure if that is causing it or not.

## 2023-12-18 NOTE — ED Notes (Signed)
 Pt. Refused the chest xray

## 2023-12-18 NOTE — Discharge Instructions (Signed)
 Follow-up with your cardiologist as we discussed.  Return to the ED for fevers chills chest pain or other concerning symptoms.

## 2023-12-18 NOTE — ED Provider Triage Note (Signed)
 Emergency Medicine Provider Triage Evaluation Note  Deshanna  L Deblanc , a 73 y.o. female  was evaluated in triage.  Pt complains of not feeling well.  Patient today had diaphoresis palpitations also feeling foggy in the head.  Patient recently started on Xarelto  for atrial fib with RVR.  Patient does have an AV pacemaker.  Patient denies any specific chest pain denies any numbness or weakness in the legs.  Patient is also on Synthroid .  Patient recently seen April 16 with concerns for cystitis that urine culture was negative.  Review of Systems  Positive: Palpitations diaphoresis Negative: Abdominal pain numbness weakness headache visual changes speech changes  Physical Exam  There were no vitals taken for this visit. Gen: Awake, no distress   Resp: Normal effort lungs clear to auscultation. Heart: Regular rate and rhythm MSK: Moves extremities without difficulty no lower extremity edema. Neuro: No focal deficit Other:   Medical Decision Making  Medically screening exam initiated at 9:35 AM.  Appropriate orders placed.  Brittin  L Maclellan was informed that the remainder of the evaluation will be completed by another provider, this initial triage assessment does not replace that evaluation, and the importance of remaining in the ED until their evaluation is complete.  Of symptoms could be related to being in atrial fibs.  Patient also could have had embolic phenomenon also possible CVA.  Patient also on Synthroid  so need to check TSH.  Will order head CT chest x-ray interrogation of the pacemaker.  EKG troponin and labs.  Patient should be placed in a room.   Rubylee Zamarripa, MD 12/18/23 [phone]

## 2023-12-19 ENCOUNTER — Telehealth: Payer: Self-pay | Admitting: Home Health

## 2023-12-19 NOTE — Telephone Encounter (Signed)
 Patient called after hour line today, reports she is feeling terrible. She reports feeling increasingly fatigued, shaky, diaphoretic since yesterday. She went to the ER, had negative workup including troponin, TSH, BNP, CBC, CMP, and CT head. She was sent home and felt no change of her symptoms. She states her heart rate was slightly fast. She wants to know what's causing her symptoms and concerned it's from her heart. She mentioned she has PPM and A fib diagnosis recently. ER provider did not notice anything significant on PPM interrogation yesterday. Query if patient had recurrence of A fib contributing to her symptoms. She has interrogation scheduled 12/22/23. Will forward this message to Dr Carolynne Citron and Cody Das NP-C who sees her outpatient. Explained to the patient that her symptoms are general and non-specific, workup at ER yesterday are reassuring, there is limited assessment can be done over the phone, would not be bad idea to discuss her symptoms with her PCP. We will need to interrogate her device to see if anything is abnormal. She may need an earlier appt with EP if there is high A fib burden or PPM issue, will defer this to EP team. She states she is not happy she got no answer for her symptoms at this time.

## 2023-12-21 ENCOUNTER — Encounter (HOSPITAL_COMMUNITY): Payer: Self-pay

## 2023-12-21 ENCOUNTER — Ambulatory Visit: Admitting: Family Medicine

## 2023-12-21 NOTE — Pre-Procedure Instructions (Signed)
 Surgical Instructions   Your procedure is scheduled on Dec 28, 2023. Report to [facility] Main Entrance "A" at 9:00 A.M., then check in with the Admitting office. Any questions or running late day of surgery: call [phone]  Questions prior to your surgery date: call [phone], Monday-Friday, 8am-4pm. If you experience any cold or flu symptoms such as cough, fever, chills, shortness of breath, etc. between now and your scheduled surgery, please notify us  at the above number.     Remember:  Do not eat after midnight the night before your surgery  You may drink clear liquids until 8:30 AM the morning of your surgery.   Clear liquids allowed are: Water , Non-Citrus Juices (without pulp), Carbonated Beverages, Clear Tea (no milk, honey, etc.), Black Coffee Only (NO MILK, CREAM OR POWDERED CREAMER of any kind), and Gatorade.  Patient Instructions  The night before surgery:  No food after midnight. ONLY clear liquids after midnight  The day of surgery (if you do NOT have diabetes):  Drink ONE (1) Pre-Surgery Clear Ensure by 8:30 AM the morning of surgery. Drink in one sitting. Do not sip.  This drink was given to you during your hospital  pre-op appointment visit.  Nothing else to drink after completing the  Pre-Surgery Clear Ensure.         If you have questions, please contact your surgeon's office.    Take these medicines the morning of surgery with A SIP OF WATER : esomeprazole (NEXIUM)  levothyroxine  (SYNTHROID )  metoprolol  succinate (TOPROL  XL)  phenazopyridine  (PYRIDIUM )  rosuvastatin  (CRESTOR )    May take these medicines IF NEEDED: EPINEPHrine  Pen fluticasone  (FLONASE ) nasal spray  valACYclovir  (VALTREX )    Follow your surgeon's instructions on when to stop (XARELTO ).  If no instructions were given by your surgeon then you will need to call the office to get those instructions.     One week prior to surgery, STOP taking any Aspirin  (unless otherwise instructed  by your surgeon) Aleve , Naproxen , Ibuprofen, Motrin, Advil, Goody's, BC's, all herbal medications, fish oil , and non-prescription vitamins. This includes your medication: diclofenac  sodium (VOLTAREN ) GEL                      Do NOT Smoke (Tobacco/Vaping) for 24 hours prior to your procedure.  If you use a CPAP at night, you may bring your mask/headgear for your overnight stay.   You will be asked to remove any contacts, glasses, piercing's, hearing aid's, dentures/partials prior to surgery. Please bring cases for these items if needed.    Patients discharged the day of surgery will not be allowed to drive home, and someone needs to stay with them for 24 hours.  SURGICAL WAITING ROOM VISITATION Patients may have no more than 2 support people in the waiting area - these visitors may rotate.   Pre-op nurse will coordinate an appropriate time for 1 ADULT support person, who may not rotate, to accompany patient in pre-op.  Children under the age of 74 must have an adult with them who is not the patient and must remain in the main waiting area with an adult.  If the patient needs to stay at the hospital during part of their recovery, the visitor guidelines for inpatient rooms apply.  Please refer to the [facility] website for the visitor guidelines for any additional information.   If you received a COVID test during your pre-op visit  it is requested that you wear a mask when out in public,  stay away from anyone that may not be feeling well and notify your surgeon if you develop symptoms. If you have been in contact with anyone that has tested positive in the last 10 days please notify you surgeon.      Pre-operative 5 CHG Bathing Instructions   You can play a key role in reducing the risk of infection after surgery. Your skin needs to be as free of germs as possible. You can reduce the number of germs on your skin by washing with CHG (chlorhexidine  gluconate) soap before surgery. CHG is an  antiseptic soap that kills germs and continues to kill germs even after washing.   DO NOT use if you have an allergy to chlorhexidine /CHG or antibacterial soaps. If your skin becomes reddened or irritated, stop using the CHG and notify one of our RNs at [phone].   Please shower with the CHG soap starting 4 days before surgery using the following schedule:     Please keep in mind the following:  DO NOT shave, including legs and underarms, starting the day of your first shower.   You may shave your face at any point before/day of surgery.  Place clean sheets on your bed the day you start using CHG soap. Use a clean washcloth (not used since being washed) for each shower. DO NOT sleep with pets once you start using the CHG.   CHG Shower Instructions:  Wash your face and private area with normal soap. If you choose to wash your hair, wash first with your normal shampoo.  After you use shampoo/soap, rinse your hair and body thoroughly to remove shampoo/soap residue.  Turn the water  OFF and apply about 3 tablespoons (45 ml) of CHG soap to a CLEAN washcloth.  Apply CHG soap ONLY FROM YOUR NECK DOWN TO YOUR TOES (washing for 3-5 minutes)  DO NOT use CHG soap on face, private areas, open wounds, or sores.  Pay special attention to the area where your surgery is being performed.  If you are having back surgery, having someone wash your back for you may be helpful. Wait 2 minutes after CHG soap is applied, then you may rinse off the CHG soap.  Pat dry with a clean towel  Put on clean clothes/pajamas   If you choose to wear lotion, please use ONLY the CHG-compatible lotions that are listed below.  Additional instructions for the day of surgery: DO NOT APPLY any lotions, deodorants, cologne, or perfumes.   Do not bring valuables to the hospital. [facility] is not responsible for any belongings/valuables. Do not wear nail polish, gel polish, artificial nails, or any other type of covering on  natural nails (fingers and toes) Do not wear jewelry or makeup Put on clean/comfortable clothes.  Please brush your teeth.  Ask your nurse before applying any prescription medications to the skin.     CHG Compatible Lotions   Aveeno Moisturizing lotion  Cetaphil Moisturizing Cream  Cetaphil Moisturizing Lotion  Clairol Herbal Essence Moisturizing Lotion, Dry Skin  Clairol Herbal Essence Moisturizing Lotion, Extra Dry Skin  Clairol Herbal Essence Moisturizing Lotion, Normal Skin  Curel Age Defying Therapeutic Moisturizing Lotion with Alpha Hydroxy  Curel Extreme Care Body Lotion  Curel Soothing Hands Moisturizing Hand Lotion  Curel Therapeutic Moisturizing Cream, Fragrance-Free  Curel Therapeutic Moisturizing Lotion, Fragrance-Free  Curel Therapeutic Moisturizing Lotion, Original Formula  Eucerin Daily Replenishing Lotion  Eucerin Dry Skin Therapy Plus Alpha Hydroxy Crme  Eucerin Dry Skin Therapy Plus Alpha Hydroxy Lotion  Eucerin Original Crme  Eucerin Original Lotion  Eucerin Plus Crme Eucerin Plus Lotion  Eucerin TriLipid Replenishing Lotion  Keri Anti-Bacterial Hand Lotion  Keri Deep Conditioning Original Lotion Dry Skin Formula Softly Scented  Keri Deep Conditioning Original Lotion, Fragrance Free Sensitive Skin Formula  Keri Lotion Fast Absorbing Fragrance Free Sensitive Skin Formula  Keri Lotion Fast Absorbing Softly Scented Dry Skin Formula  Keri Original Lotion  Keri Skin Renewal Lotion Keri Silky Smooth Lotion  Keri Silky Smooth Sensitive Skin Lotion  Nivea Body Creamy Conditioning Oil  Nivea Body Extra Enriched Lotion  Nivea Body Original Lotion  Nivea Body Sheer Moisturizing Lotion Nivea Crme  Nivea Skin Firming Lotion  NutraDerm 30 Skin Lotion  NutraDerm Skin Lotion  NutraDerm Therapeutic Skin Cream  NutraDerm Therapeutic Skin Lotion  ProShield Protective Hand Cream  Provon moisturizing lotion  Please read over the following fact sheets that you were  given.

## 2023-12-21 NOTE — Progress Notes (Signed)
 PERIOPERATIVE PRESCRIPTION FOR IMPLANTED CARDIAC DEVICE PROGRAMMING  Patient Information: Name:  Kari  MARYMARGARET [name]  DOB:  12/18/1950  MRN:  [id number]    Planned Procedure:  Left Total Hip Replacement  Surgeon:  Dr. Dyana Glade  Date of Procedure:  12/28/2023  Cautery will be used.  Position during surgery:  Supine   Please send documentation back to:  Arlin Benes (Fax # [phone])   Device Information:  Clinic EP Physician:  Manya Sells, MD   Device Type:  Pacemaker Manufacturer and Phone #:  [employer]: [phone] Pacemaker Dependent?:  Yes.   Date of Last Device Check:  12/18/2023 Normal Device Function?:  Yes.    Electrophysiologist's Recommendations:  Have magnet available. Provide continuous ECG monitoring when magnet is used or reprogramming is to be performed.  Procedure should not interfere with device function.  No device programming or magnet placement needed.  Per Device Clinic Standing Orders, Glorianne Largo, RN  1:48 PM 12/21/2023

## 2023-12-22 ENCOUNTER — Telehealth (HOSPITAL_COMMUNITY): Payer: Self-pay | Admitting: *Deleted

## 2023-12-22 ENCOUNTER — Encounter (HOSPITAL_COMMUNITY)
Admission: RE | Admit: 2023-12-22 | Discharge: 2023-12-22 | Disposition: A | Discharge status: Home / Self Care | Source: Ambulatory Visit | Attending: Orthopaedic Surgery | Admitting: Orthopaedic Surgery

## 2023-12-22 ENCOUNTER — Ambulatory Visit (INDEPENDENT_AMBULATORY_CARE_PROVIDER_SITE_OTHER): Payer: Commercial Managed Care - PPO

## 2023-12-22 ENCOUNTER — Other Ambulatory Visit: Payer: Self-pay

## 2023-12-22 ENCOUNTER — Encounter (HOSPITAL_COMMUNITY): Payer: Self-pay

## 2023-12-22 ENCOUNTER — Telehealth: Payer: Self-pay

## 2023-12-22 VITALS — BP 150/75 | HR 59 | Temp 98.0°F | Resp 17 | Ht 68.0 in | Wt 173.0 lb

## 2023-12-22 DIAGNOSIS — I441 Atrioventricular block, second degree: Secondary | ICD-10-CM | POA: Diagnosis not present

## 2023-12-22 DIAGNOSIS — Z01812 Encounter for preprocedural laboratory examination: Secondary | ICD-10-CM | POA: Diagnosis present

## 2023-12-22 DIAGNOSIS — K219 Gastro-esophageal reflux disease without esophagitis: Secondary | ICD-10-CM | POA: Diagnosis not present

## 2023-12-22 DIAGNOSIS — Z01818 Encounter for other preprocedural examination: Secondary | ICD-10-CM

## 2023-12-22 DIAGNOSIS — M1612 Unilateral primary osteoarthritis, left hip: Secondary | ICD-10-CM | POA: Diagnosis not present

## 2023-12-22 DIAGNOSIS — I4891 Unspecified atrial fibrillation: Secondary | ICD-10-CM | POA: Diagnosis not present

## 2023-12-22 DIAGNOSIS — Z7901 Long term (current) use of anticoagulants: Secondary | ICD-10-CM | POA: Diagnosis not present

## 2023-12-22 DIAGNOSIS — Z950 Presence of cardiac pacemaker: Secondary | ICD-10-CM | POA: Insufficient documentation

## 2023-12-22 DIAGNOSIS — I10 Essential (primary) hypertension: Secondary | ICD-10-CM | POA: Insufficient documentation

## 2023-12-22 DIAGNOSIS — E039 Hypothyroidism, unspecified: Secondary | ICD-10-CM | POA: Diagnosis not present

## 2023-12-22 DIAGNOSIS — Z86718 Personal history of other venous thrombosis and embolism: Secondary | ICD-10-CM | POA: Diagnosis not present

## 2023-12-22 DIAGNOSIS — I442 Atrioventricular block, complete: Secondary | ICD-10-CM | POA: Diagnosis not present

## 2023-12-22 HISTORY — DX: Hypothyroidism, unspecified: E03.9

## 2023-12-22 LAB — TYPE AND SCREEN
ABO/RH(D): A POS
Antibody Screen: NEGATIVE

## 2023-12-22 LAB — SURGICAL PCR SCREEN
MRSA, PCR: NEGATIVE
Staphylococcus aureus: NEGATIVE

## 2023-12-22 NOTE — Telephone Encounter (Signed)
 Attempted to call patient regarding upcoming cardiac CT appointment. Left message on voicemail with name and callback number Johney Frame RN Navigator Cardiac Imaging [facility] [phone] Office

## 2023-12-22 NOTE — Progress Notes (Signed)
 PCP - Dr. Shellie Dials [facility] Cardiologist - Dr. Manya Sells - Last office visit 12/03/2023  PPM/ICD - [facility] PPM Device Orders - Orders requested and received via EPIC Rep Notified - Rep notified on 12/22/23 of surgery, date time and patient arrival time.  Chest x-ray - 12/02/2023 EKG - 12/18/2023 Stress Test - 04/27/2013 ECHO - 04/01/2022 Cardiac Cath - Denies  Sleep Study - Denies CPAP - n/a  No DM  Last dose of GLP1 agonist- n/a GLP1 instructions: n/a  Blood Thinner Instructions: Pt instructed to hold Xarelto  for 3 days. Last dose will be May 1st Aspirin  Instructions: n/a  ERAS Protcol - Clear liquids until 0830 morning of surgery PRE-SURGERY Ensure or G2- Ensure given to pt with instructions  COVID TEST- n/a   Anesthesia review: Yes. 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 On Xarelto  for a.fib that was noted recently via PPM. Pt is also having a CTA chest (personally requested) April 30th at 2:30pm. Anesthesia PA to follow-up with results.   Patient denies shortness of breath, fever, cough and chest pain at PAT appointment. Pt denies any respiratory illness/infection in the last two months.   All instructions explained to the patient, with a verbal understanding of the material. Patient agrees to go over the instructions while at home for a better understanding. Patient also instructed to self quarantine after being tested for COVID-19. The opportunity to ask questions was provided.

## 2023-12-22 NOTE — Telephone Encounter (Signed)
3 days prior

## 2023-12-22 NOTE — Telephone Encounter (Signed)
 Notified patient. She has questions about her surgery time change.

## 2023-12-23 ENCOUNTER — Telehealth: Payer: Self-pay | Admitting: Orthopaedic Surgery

## 2023-12-23 ENCOUNTER — Encounter: Payer: Self-pay | Admitting: Internal Medicine

## 2023-12-23 ENCOUNTER — Telehealth: Payer: Self-pay | Admitting: Internal Medicine

## 2023-12-23 ENCOUNTER — Other Ambulatory Visit: Payer: Self-pay | Admitting: Physician Assistant

## 2023-12-23 ENCOUNTER — Ambulatory Visit (HOSPITAL_COMMUNITY)
Admission: RE | Admit: 2023-12-23 | Discharge: 2023-12-23 | Disposition: A | Discharge status: Home / Self Care | Source: Ambulatory Visit | Attending: Pulmonary Disease | Admitting: Pulmonary Disease

## 2023-12-23 DIAGNOSIS — I2510 Atherosclerotic heart disease of native coronary artery without angina pectoris: Secondary | ICD-10-CM | POA: Diagnosis not present

## 2023-12-23 DIAGNOSIS — R072 Precordial pain: Secondary | ICD-10-CM | POA: Insufficient documentation

## 2023-12-23 LAB — CUP PACEART REMOTE DEVICE CHECK
Battery Remaining Longevity: 132 mo
Battery Remaining Percentage: 100 %
Brady Statistic RA Percent Paced: 50 %
Brady Statistic RV Percent Paced: 100 %
Date Time Interrogation Session: 20250429074500
Implantable Lead Connection Status: 753985
Implantable Lead Connection Status: 753985
Implantable Lead Implant Date: 20230731
Implantable Lead Implant Date: 20230731
Implantable Lead Location: 753859
Implantable Lead Location: 753860
Implantable Lead Model: 7841
Implantable Lead Model: 7842
Implantable Lead Serial Number: 1215821
Implantable Lead Serial Number: 1304218
Implantable Pulse Generator Implant Date: 20230731
Lead Channel Impedance Value: 657 Ohm
Lead Channel Impedance Value: 745 Ohm
Lead Channel Setting Pacing Amplitude: 2 V
Lead Channel Setting Pacing Amplitude: 2.5 V
Lead Channel Setting Pacing Pulse Width: 0.6 ms
Lead Channel Setting Sensing Sensitivity: 4 mV
Pulse Gen Serial Number: 600527
Zone Setting Status: 755011

## 2023-12-23 MED ORDER — NITROGLYCERIN 0.4 MG SL SUBL: 0.8000 mg | SUBLINGUAL_TABLET | Freq: Once | SUBLINGUAL | Status: DC

## 2023-12-23 MED ORDER — IOHEXOL 350 MG/ML SOLN
100.0000 mL | Freq: Once | INTRAVENOUS | Status: AC | PRN
  Administered 2023-12-23: 100 mL via INTRAVENOUS

## 2023-12-23 MED ORDER — METHOCARBAMOL 750 MG PO TABS: 750.0000 mg | ORAL_TABLET | Freq: Three times a day (TID) | ORAL | 2 refills | Status: DC | PRN

## 2023-12-23 MED ORDER — DOCUSATE SODIUM 100 MG PO CAPS: 100.0000 mg | ORAL_CAPSULE | Freq: Every day | ORAL | 2 refills | Status: DC | PRN

## 2023-12-23 MED ORDER — NITROGLYCERIN 0.4 MG SL SUBL
SUBLINGUAL_TABLET | SUBLINGUAL | Status: AC
  Filled 2023-12-23: qty 2

## 2023-12-23 MED ORDER — ONDANSETRON HCL 4 MG PO TABS: 4.0000 mg | ORAL_TABLET | Freq: Three times a day (TID) | ORAL | 0 refills | Status: DC | PRN

## 2023-12-23 MED ORDER — OXYCODONE-ACETAMINOPHEN 5-325 MG PO TABS: 1.0000 | ORAL_TABLET | Freq: Four times a day (QID) | ORAL | 0 refills | Status: DC | PRN

## 2023-12-23 NOTE — Telephone Encounter (Signed)
 Patient states she had to called out of work on 12/19/23 due to the fact she can not stand the pain is so severe. Pt states she needs her FMLA date changed to start 12/19/23 instead. .  Pt also needs the update note faxed to [facility] and [employer] once completed.

## 2023-12-23 NOTE — Telephone Encounter (Signed)
 sure

## 2023-12-23 NOTE — Progress Notes (Addendum)
 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 Echo 03/2022 showed EF 60 to 65%, normal RV function, no significant valvular abnormalities.  She was cleared for surgery in telephone encounter 11/16/2023 by Slater Duncan, NP, "Chart reviewed as part of pre-operative protocol coverage. Given past medical history and time since last visit, based on ACC/AHA guidelines, Kari [name] would be at acceptable risk for the planned procedure without further cardiovascular testing. Patient was advised that if she develops new symptoms prior to surgery to contact our office to arrange a follow-up appointment. She verbalized understanding."  Other pertinent history includes PONV, hypothyroid, GERD on PPI.  CMP and CBC 12/18/2023 reviewed, WNL.  Addendum 12/25/23: Pt had Coronary CTA done 12/24/23 - testing reportedly was done at her request. Results showed moderate mid LAD stenosis with FFR 0.95 indicating non flow-limiting disease.  EKG 12/18/2023: Atrial-sensed ventricular-paced rhythm with prolonged AV conduction. Rate 62.  Perioperative prescription for implanted cardiac device programming per progress note 12/21/2023: Device Information:   Clinic EP Physician:  Manya Sells, MD    Device Type:  Pacemaker Manufacturer and Phone #:  [employer]: [phone] Pacemaker Dependent?:  Yes.   Date of Last Device Check:  12/18/2023     Normal Device Function?:  Yes.     Electrophysiologist's Recommendations:   Have magnet available. Provide continuous ECG monitoring when magnet is used or reprogramming is to be performed.  Procedure should not interfere with device function.  No device programming or magnet placement needed.  TTE 04/01/2022: 1. Left ventricular ejection fraction, by estimation, is 60 to 65%.  The  left ventricle has normal function. The left ventricle has no regional  wall motion abnormalities.   2. Right ventricular systolic function is normal. The right ventricular  size is normal. There is normal pulmonary artery systolic pressure. The  estimated right ventricular systolic pressure is 22.4 mmHg.   3. The mitral valve is normal in structure. No evidence of mitral  stenosis.   4. The aortic valve is grossly normal.     Kari [name]/Anesthesiology Phone [phone] 12/23/2023 11:22 AM

## 2023-12-23 NOTE — Telephone Encounter (Signed)
 Will update upon Dr. Mollie Anger approval.

## 2023-12-23 NOTE — Telephone Encounter (Signed)
 Kari Fall, MD     12/23/23  1:26 PM I am happy for her to see Dr. Theodis Fiscal. Dr. Jolan Natal can follow her PPM.   Patient identification verified by 2 forms. Kari Lucky, RN    Called and spoke to patient  Informed patient:   -Per Dr. Carolynne Citron okay to schedule with Dr. Theodis Fiscal   -at this time no OV available with Dr. Theodis Fiscal   -RN can schedule patient with APP for sooner OV  Patient requests OV with Dr. Theodis Fiscal first  Reviewed ED warning signs/precautions  Patient verbalized understanding, no questions at this time

## 2023-12-23 NOTE — Telephone Encounter (Signed)
 [employer] form updated/faxed.

## 2023-12-23 NOTE — Telephone Encounter (Signed)
 Can we extend her out of work to start earlier?

## 2023-12-23 NOTE — Telephone Encounter (Signed)
 Patients is asking that a referral be put in so that she can see dr Theodis Fiscal. Please advise

## 2023-12-23 NOTE — Anesthesia Preprocedure Evaluation (Addendum)
 Anesthesia Evaluation  Patient identified by MRN, date of birth, ID band Patient awake    Reviewed: Allergy & Precautions, NPO status , Patient's Chart, lab work & pertinent test results  History of Anesthesia Complications (+) PONV and history of anesthetic complications  Airway Mallampati: III  TM Distance: >3 FB Neck ROM: Full   Comment: Previous grade I view with MAC 3, easy mask Dental  (+) Dental Advisory Given   Pulmonary neg pulmonary ROS   Pulmonary exam normal breath sounds clear to auscultation       Cardiovascular hypertension (metoprolol ), Pt. on home beta blockers (-) angina + CAD (non-flow limiting) and + DVT (age 73 s/p knee arthroscopy)  (-) Past MI, (-) Cardiac Stents and (-) CABG + dysrhythmias (Mobitz type 2 AV block, complete heart block) Atrial Fibrillation + pacemaker ([employer] dual-chamber)  Rhythm:Regular Rate:Normal  HLD  TTE 04/01/2022: IMPRESSIONS    1. Left ventricular ejection fraction, by estimation, is 60 to 65%. The  left ventricle has normal function. The left ventricle has no regional  wall motion abnormalities.   2. Right ventricular systolic function is normal. The right ventricular  size is normal. There is normal pulmonary artery systolic pressure. The  estimated right ventricular systolic pressure is 22.4 mmHg.   3. The mitral valve is normal in structure. No evidence of mitral  stenosis.   4. The aortic valve is grossly normal.     Neuro/Psych neg Seizures Chronic back pain    GI/Hepatic Neg liver ROS,GERD  Medicated,,Diverticulitis    Endo/Other  neg diabetesHypothyroidism    Renal/GU negative Renal ROS     Musculoskeletal  (+) Arthritis , Osteoarthritis,    Abdominal   Peds  Hematology negative hematology ROS (+) Lab Results      Component                Value               Date                      WBC                      5.4                 12/18/2023                 HGB                      13.0                12/18/2023                HCT                      40.0                12/18/2023                MCV                      95.5                12/18/2023                PLT                      244  12/18/2023              Anesthesia Other Findings Patient presented to the ED 12/18/2023 for evaluation of generalized malaise associated with diaphoresis, palpitations, and feeling lightheaded. ED workup was unremarkable. Pacemaker was interrogated with no significant arrhythmias noted. Has not had this sensation again.  Last Xarelto : 12/24/2023  Electrophysiologist's Recommendations:    Have magnet available.  Provide continuous ECG monitoring when magnet is used or reprogramming is to be performed.   Procedure should not interfere with device function.  No device programming or magnet placement needed.   Reproductive/Obstetrics                             Anesthesia Physical Anesthesia Plan  ASA: 3  Anesthesia Plan: MAC and Spinal   Post-op Pain Management: Tylenol  PO (pre-op)*   Induction: Intravenous  PONV Risk Score and Plan: 3 and Ondansetron , Dexamethasone  and Treatment may vary due to age or medical condition  Airway Management Planned: Natural Airway and Simple Face Mask  Additional Equipment:   Intra-op Plan:   Post-operative Plan:   Informed Consent: I have reviewed the patients History and Physical, chart, labs and discussed the procedure including the risks, benefits and alternatives for the proposed anesthesia with the patient or authorized representative who has indicated his/her understanding and acceptance.     Dental advisory given  Plan Discussed with: CRNA and Anesthesiologist  Anesthesia Plan Comments: (I have discussed risks of neuraxial anesthesia including but not limited to infection, bleeding, nerve injury, back pain, headache, seizures, and failure of  block. Patient denies bleeding disorders and is not currently anticoagulated. Labs have been reviewed. Risks and benefits discussed. All patient's questions answered.   Discussed with patient risks of MAC including, but not limited to, minor pain or discomfort, hearing people in the room, and possible need for backup general anesthesia. Risks for general anesthesia also discussed including, but not limited to, sore throat, hoarse voice, chipped/damaged teeth, injury to vocal cords, nausea and vomiting, allergic reactions, lung infection, heart attack, stroke, and death. All questions answered.   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 Echo 03/2022 showed EF 60 to 65%, normal RV function, no significant valvular abnormalities.  She was cleared for surgery in telephone encounter 11/16/2023 by Slater Duncan, NP, "Chart reviewed as part of pre-operative protocol coverage. Given past medical history and time since last visit, based on ACC/AHA guidelines, Kari  L [name] would be at acceptable risk for the planned procedure without further cardiovascular testing. Patient was advised that if she develops new symptoms prior to surgery to contact our office to arrange a follow-up appointment. She verbalized understanding."  Other pertinent history includes PONV, hypothyroid, GERD on PPI.  CMP and CBC 12/18/2023 reviewed, WNL.  Addendum 12/25/23: Pt had Coronary CTA done 12/24/23 - testing reportedly was done at her request. Results showed moderate mid LAD stenosis with FFR 0.95 indicating non flow-limiting disease.  EKG 12/18/2023: Atrial-sensed ventricular-paced rhythm with prolonged AV conduction. Rate 62.  Perioperative prescription for implanted cardiac device programming per progress note  12/21/2023: Device Information:  Clinic EP Physician:  Manya Sells, MD   Device Type:  Pacemaker Manufacturer and Phone #:  [employer]: [phone] Pacemaker Dependent?:  Yes.   Date of Last Device Check:  12/18/2023     Normal Device Function?:  Yes.    Electrophysiologist's Recommendations:   Have magnet available.  Provide continuous ECG monitoring when magnet is used or reprogramming is to be performed.   Procedure should not interfere with device function.  No device programming or magnet placement needed.  TTE 04/01/2022: 1. Left ventricular ejection fraction, by estimation, is 60 to 65%. The  left ventricle has normal function. The left ventricle has no regional  wall motion abnormalities.  2. Right ventricular systolic function is normal. The right ventricular  size is normal. There is normal pulmonary artery systolic pressure. The  estimated right ventricular systolic pressure is 22.4 mmHg.  3. The mitral valve is normal in structure. No evidence of mitral  stenosis.  4. The aortic valve is grossly normal.    )        Anesthesia Quick Evaluation

## 2023-12-23 NOTE — Telephone Encounter (Signed)
 Patient identification verified by 2 forms. Hilton Lucky, RN    Called and spoke to patient  Patient states:   -she is having arrhythmia concerns   -arrhythmia has been a issue for some time   -Dr. Carolynne Citron is her EP  -would like to have a primary cardiologist to manage arrhythmia  Informed patient:   -Per chart review Dr. Carolynne Citron listed as primary cardiologist   -message sent to Dr. Carolynne Citron (message also set via Mychart   -once response received she will be updated  Patient verbalized understanding, no questions at this time

## 2023-12-24 ENCOUNTER — Other Ambulatory Visit: Payer: Self-pay | Admitting: Cardiology

## 2023-12-24 ENCOUNTER — Ambulatory Visit (HOSPITAL_COMMUNITY)
Admission: RE | Admit: 2023-12-24 | Discharge: 2023-12-24 | Disposition: A | Discharge status: Home / Self Care | Source: Ambulatory Visit | Attending: Cardiology | Admitting: Cardiology

## 2023-12-24 DIAGNOSIS — R931 Abnormal findings on diagnostic imaging of heart and coronary circulation: Secondary | ICD-10-CM | POA: Insufficient documentation

## 2023-12-25 ENCOUNTER — Encounter: Payer: Self-pay | Admitting: Family Medicine

## 2023-12-25 ENCOUNTER — Other Ambulatory Visit: Payer: Self-pay

## 2023-12-25 ENCOUNTER — Other Ambulatory Visit: Payer: Self-pay | Admitting: Family Medicine

## 2023-12-25 DIAGNOSIS — E7800 Pure hypercholesterolemia, unspecified: Secondary | ICD-10-CM

## 2023-12-25 MED ORDER — TRANEXAMIC ACID 1000 MG/10ML IV SOLN
2000.0000 mg | INTRAVENOUS | Status: AC
  Filled 2023-12-25: qty 20

## 2023-12-25 MED ORDER — ROSUVASTATIN CALCIUM 20 MG PO TABS: 20.0000 mg | ORAL_TABLET | Freq: Every day | ORAL | 3 refills | Status: DC

## 2023-12-27 DIAGNOSIS — M1612 Unilateral primary osteoarthritis, left hip: Secondary | ICD-10-CM | POA: Insufficient documentation

## 2023-12-28 ENCOUNTER — Telehealth: Payer: Self-pay | Admitting: Pulmonary Disease

## 2023-12-28 ENCOUNTER — Observation Stay (HOSPITAL_COMMUNITY)
Admission: RE | Admit: 2023-12-28 | Discharge: 2023-12-29 | Disposition: A | Discharge status: Home / Self Care | Attending: Orthopaedic Surgery | Admitting: Orthopaedic Surgery

## 2023-12-28 ENCOUNTER — Ambulatory Visit (HOSPITAL_COMMUNITY)

## 2023-12-28 ENCOUNTER — Observation Stay (HOSPITAL_COMMUNITY)

## 2023-12-28 ENCOUNTER — Encounter (HOSPITAL_COMMUNITY)
Admission: RE | Disposition: A | Discharge status: Home / Self Care | Payer: Self-pay | Source: Home / Self Care | Attending: Orthopaedic Surgery

## 2023-12-28 ENCOUNTER — Other Ambulatory Visit: Payer: Self-pay | Admitting: Physician Assistant

## 2023-12-28 ENCOUNTER — Other Ambulatory Visit: Payer: Self-pay

## 2023-12-28 ENCOUNTER — Ambulatory Visit (HOSPITAL_COMMUNITY): Payer: Self-pay | Admitting: Physician Assistant

## 2023-12-28 ENCOUNTER — Encounter (HOSPITAL_COMMUNITY): Payer: Self-pay | Admitting: Orthopaedic Surgery

## 2023-12-28 DIAGNOSIS — E039 Hypothyroidism, unspecified: Secondary | ICD-10-CM | POA: Diagnosis not present

## 2023-12-28 DIAGNOSIS — I10 Essential (primary) hypertension: Secondary | ICD-10-CM | POA: Diagnosis not present

## 2023-12-28 DIAGNOSIS — Z471 Aftercare following joint replacement surgery: Secondary | ICD-10-CM | POA: Diagnosis not present

## 2023-12-28 DIAGNOSIS — M1612 Unilateral primary osteoarthritis, left hip: Secondary | ICD-10-CM | POA: Diagnosis not present

## 2023-12-28 DIAGNOSIS — I4891 Unspecified atrial fibrillation: Secondary | ICD-10-CM | POA: Diagnosis not present

## 2023-12-28 DIAGNOSIS — Z96651 Presence of right artificial knee joint: Secondary | ICD-10-CM | POA: Insufficient documentation

## 2023-12-28 DIAGNOSIS — Z86718 Personal history of other venous thrombosis and embolism: Secondary | ICD-10-CM | POA: Insufficient documentation

## 2023-12-28 DIAGNOSIS — Z950 Presence of cardiac pacemaker: Secondary | ICD-10-CM | POA: Insufficient documentation

## 2023-12-28 DIAGNOSIS — I2510 Atherosclerotic heart disease of native coronary artery without angina pectoris: Secondary | ICD-10-CM | POA: Diagnosis not present

## 2023-12-28 DIAGNOSIS — Z96642 Presence of left artificial hip joint: Secondary | ICD-10-CM | POA: Diagnosis not present

## 2023-12-28 DIAGNOSIS — Z7901 Long term (current) use of anticoagulants: Secondary | ICD-10-CM | POA: Diagnosis not present

## 2023-12-28 DIAGNOSIS — Z79899 Other long term (current) drug therapy: Secondary | ICD-10-CM | POA: Insufficient documentation

## 2023-12-28 HISTORY — PX: TOTAL HIP ARTHROPLASTY: SHX124

## 2023-12-28 SURGERY — ARTHROPLASTY, HIP, TOTAL, ANTERIOR APPROACH
Anesthesia: Monitor Anesthesia Care | Site: Hip | Laterality: Left

## 2023-12-28 MED ORDER — ALUM & MAG HYDROXIDE-SIMETH 200-200-20 MG/5ML PO SUSP: 30.0000 mL | ORAL | Status: DC | PRN

## 2023-12-28 MED ORDER — PHENOL 1.4 % MT LIQD: 1.0000 | OROMUCOSAL | Status: DC | PRN

## 2023-12-28 MED ORDER — SODIUM CHLORIDE 0.9 % IR SOLN
Status: DC | PRN
  Administered 2023-12-28: 3000 mL

## 2023-12-28 MED ORDER — DEXAMETHASONE SODIUM PHOSPHATE 10 MG/ML IJ SOLN
10.0000 mg | Freq: Once | INTRAMUSCULAR | Status: AC
  Administered 2023-12-29: 10 mg via INTRAVENOUS
  Filled 2023-12-28: qty 1

## 2023-12-28 MED ORDER — METHOCARBAMOL 500 MG PO TABS
500.0000 mg | ORAL_TABLET | Freq: Four times a day (QID) | ORAL | Status: DC | PRN
  Administered 2023-12-28: 500 mg via ORAL

## 2023-12-28 MED ORDER — POLYETHYLENE GLYCOL 3350 17 G PO PACK
17.0000 g | PACK | Freq: Every day | ORAL | Status: DC
  Filled 2023-12-28: qty 1

## 2023-12-28 MED ORDER — ONDANSETRON HCL 4 MG/2ML IJ SOLN
INTRAMUSCULAR | Status: AC
  Filled 2023-12-28: qty 2

## 2023-12-28 MED ORDER — RIVAROXABAN 10 MG PO TABS
10.0000 mg | ORAL_TABLET | Freq: Every day | ORAL | Status: DC
  Filled 2023-12-28: qty 1

## 2023-12-28 MED ORDER — SORBITOL 70 % SOLN: 30.0000 mL | Freq: Every day | Status: DC | PRN

## 2023-12-28 MED ORDER — MAGNESIUM CITRATE PO SOLN: 1.0000 | Freq: Once | ORAL | Status: DC | PRN

## 2023-12-28 MED ORDER — METHOCARBAMOL 1000 MG/10ML IJ SOLN
500.0000 mg | Freq: Four times a day (QID) | INTRAMUSCULAR | Status: DC | PRN
  Filled 2023-12-28: qty 10

## 2023-12-28 MED ORDER — METOPROLOL SUCCINATE ER 25 MG PO TB24
25.0000 mg | ORAL_TABLET | Freq: Every day | ORAL | Status: DC
  Administered 2023-12-29: 25 mg via ORAL
  Filled 2023-12-28: qty 1

## 2023-12-28 MED ORDER — AMISULPRIDE (ANTIEMETIC) 5 MG/2ML IV SOLN: 10.0000 mg | Freq: Once | INTRAVENOUS | Status: DC | PRN

## 2023-12-28 MED ORDER — BUPIVACAINE IN DEXTROSE 0.75-8.25 % IT SOLN
INTRATHECAL | Status: DC | PRN
  Administered 2023-12-28: 1.8 mL via INTRATHECAL

## 2023-12-28 MED ORDER — BUPIVACAINE-MELOXICAM ER 400-12 MG/14ML IJ SOLN
INTRAMUSCULAR | Status: DC | PRN
  Administered 2023-12-28: 400 mg

## 2023-12-28 MED ORDER — MENTHOL 3 MG MT LOZG: 1.0000 | LOZENGE | OROMUCOSAL | Status: DC | PRN

## 2023-12-28 MED ORDER — METOCLOPRAMIDE HCL 5 MG PO TABS: 5.0000 mg | ORAL_TABLET | Freq: Three times a day (TID) | ORAL | Status: DC | PRN

## 2023-12-28 MED ORDER — METOCLOPRAMIDE HCL 5 MG/ML IJ SOLN: 5.0000 mg | Freq: Three times a day (TID) | INTRAMUSCULAR | Status: DC | PRN

## 2023-12-28 MED ORDER — LEVOTHYROXINE SODIUM 25 MCG PO TABS
25.0000 ug | ORAL_TABLET | Freq: Every day | ORAL | Status: DC
  Administered 2023-12-29: 25 ug via ORAL
  Filled 2023-12-28: qty 1

## 2023-12-28 MED ORDER — ACETAMINOPHEN 500 MG PO TABS
1000.0000 mg | ORAL_TABLET | Freq: Once | ORAL | Status: AC
  Administered 2023-12-28: 1000 mg via ORAL
  Filled 2023-12-28: qty 2

## 2023-12-28 MED ORDER — PHENYLEPHRINE 80 MCG/ML (10ML) SYRINGE FOR IV PUSH (FOR BLOOD PRESSURE SUPPORT)
PREFILLED_SYRINGE | INTRAVENOUS | Status: DC | PRN
  Administered 2023-12-28 (×2): 80 ug via INTRAVENOUS
  Administered 2023-12-28: 160 ug via INTRAVENOUS

## 2023-12-28 MED ORDER — LIDOCAINE 2% (20 MG/ML) 5 ML SYRINGE
INTRAMUSCULAR | Status: AC
  Filled 2023-12-28: qty 5

## 2023-12-28 MED ORDER — DOCUSATE SODIUM 100 MG PO CAPS
100.0000 mg | ORAL_CAPSULE | Freq: Two times a day (BID) | ORAL | Status: DC
  Administered 2023-12-28 – 2023-12-29 (×2): 100 mg via ORAL
  Filled 2023-12-28 (×2): qty 1

## 2023-12-28 MED ORDER — VANCOMYCIN HCL 1000 MG IV SOLR
INTRAVENOUS | Status: AC
  Filled 2023-12-28: qty 20

## 2023-12-28 MED ORDER — TOBRAMYCIN SULFATE 1.2 G IJ SOLR
INTRAMUSCULAR | Status: DC | PRN
Start: 2023-12-28 — End: 2023-12-28
  Administered 2023-12-28: 1.2 g

## 2023-12-28 MED ORDER — OXYCODONE HCL 5 MG PO TABS: 5.0000 mg | ORAL_TABLET | Freq: Once | ORAL | Status: DC | PRN

## 2023-12-28 MED ORDER — PROPOFOL 10 MG/ML IV BOLUS
INTRAVENOUS | Status: DC | PRN
  Administered 2023-12-28 (×2): 50 mg via INTRAVENOUS

## 2023-12-28 MED ORDER — PROPOFOL 1000 MG/100ML IV EMUL
INTRAVENOUS | Status: AC
  Filled 2023-12-28: qty 100

## 2023-12-28 MED ORDER — PRONTOSAN WOUND IRRIGATION OPTIME
TOPICAL | Status: DC | PRN
  Administered 2023-12-28: 1

## 2023-12-28 MED ORDER — BUPIVACAINE-MELOXICAM ER 400-12 MG/14ML IJ SOLN
INTRAMUSCULAR | Status: AC
  Filled 2023-12-28: qty 1

## 2023-12-28 MED ORDER — DEXAMETHASONE SODIUM PHOSPHATE 10 MG/ML IJ SOLN
INTRAMUSCULAR | Status: DC | PRN
  Administered 2023-12-28: 10 mg via INTRAVENOUS

## 2023-12-28 MED ORDER — MIDAZOLAM HCL 2 MG/2ML IJ SOLN
INTRAMUSCULAR | Status: AC
  Filled 2023-12-28: qty 2

## 2023-12-28 MED ORDER — HYDROMORPHONE HCL 1 MG/ML IJ SOLN: 0.2500 mg | INTRAMUSCULAR | Status: DC | PRN

## 2023-12-28 MED ORDER — OXYCODONE HCL 5 MG/5ML PO SOLN: 5.0000 mg | Freq: Once | ORAL | Status: DC | PRN

## 2023-12-28 MED ORDER — ONDANSETRON HCL 4 MG/2ML IJ SOLN
INTRAMUSCULAR | Status: DC | PRN
  Administered 2023-12-28: 4 mg via INTRAVENOUS

## 2023-12-28 MED ORDER — LIDOCAINE 2% (20 MG/ML) 5 ML SYRINGE
INTRAMUSCULAR | Status: DC | PRN
  Administered 2023-12-28: 80 mg via INTRAVENOUS

## 2023-12-28 MED ORDER — OXYCODONE HCL 5 MG PO TABS: 10.0000 mg | ORAL_TABLET | ORAL | Status: DC | PRN

## 2023-12-28 MED ORDER — 0.9 % SODIUM CHLORIDE (POUR BTL) OPTIME
TOPICAL | Status: DC | PRN
  Administered 2023-12-28: 1000 mL

## 2023-12-28 MED ORDER — POVIDONE-IODINE 10 % EX SWAB
2.0000 | Freq: Once | CUTANEOUS | Status: AC
  Administered 2023-12-28: 2 via TOPICAL

## 2023-12-28 MED ORDER — FENTANYL CITRATE (PF) 250 MCG/5ML IJ SOLN
INTRAMUSCULAR | Status: AC
  Filled 2023-12-28: qty 5

## 2023-12-28 MED ORDER — DEXAMETHASONE SODIUM PHOSPHATE 10 MG/ML IJ SOLN
INTRAMUSCULAR | Status: AC
  Filled 2023-12-28: qty 1

## 2023-12-28 MED ORDER — ACETAMINOPHEN 500 MG PO TABS
1000.0000 mg | ORAL_TABLET | Freq: Four times a day (QID) | ORAL | Status: DC
  Administered 2023-12-28 (×2): 1000 mg via ORAL
  Filled 2023-12-28 (×3): qty 2

## 2023-12-28 MED ORDER — EPHEDRINE SULFATE (PRESSORS) 50 MG/ML IJ SOLN
INTRAMUSCULAR | Status: DC | PRN
  Administered 2023-12-28 (×2): 10 mg via INTRAVENOUS

## 2023-12-28 MED ORDER — CEFAZOLIN SODIUM-DEXTROSE 2-4 GM/100ML-% IV SOLN
2.0000 g | INTRAVENOUS | Status: AC
  Administered 2023-12-28: 2 g via INTRAVENOUS
  Filled 2023-12-28: qty 100

## 2023-12-28 MED ORDER — FENTANYL CITRATE (PF) 250 MCG/5ML IJ SOLN
INTRAMUSCULAR | Status: DC | PRN
  Administered 2023-12-28: 50 ug via INTRAVENOUS

## 2023-12-28 MED ORDER — CEFAZOLIN SODIUM-DEXTROSE 2-4 GM/100ML-% IV SOLN
2.0000 g | Freq: Four times a day (QID) | INTRAVENOUS | Status: AC
  Administered 2023-12-28 (×2): 2 g via INTRAVENOUS
  Filled 2023-12-28 (×2): qty 100

## 2023-12-28 MED ORDER — METHOCARBAMOL 500 MG PO TABS
ORAL_TABLET | ORAL | Status: AC
  Filled 2023-12-28: qty 1

## 2023-12-28 MED ORDER — CHLORHEXIDINE GLUCONATE 0.12 % MT SOLN
15.0000 mL | Freq: Once | OROMUCOSAL | Status: AC
  Administered 2023-12-28: 15 mL via OROMUCOSAL
  Filled 2023-12-28: qty 15

## 2023-12-28 MED ORDER — ORAL CARE MOUTH RINSE: 15.0000 mL | Freq: Once | OROMUCOSAL | Status: AC

## 2023-12-28 MED ORDER — OXYCODONE HCL ER 10 MG PO T12A
10.0000 mg | EXTENDED_RELEASE_TABLET | Freq: Two times a day (BID) | ORAL | Status: DC
  Administered 2023-12-28 – 2023-12-29 (×2): 10 mg via ORAL
  Filled 2023-12-28 (×2): qty 1

## 2023-12-28 MED ORDER — ONDANSETRON HCL 4 MG PO TABS
4.0000 mg | ORAL_TABLET | Freq: Four times a day (QID) | ORAL | Status: DC | PRN
  Administered 2023-12-28: 4 mg via ORAL
  Filled 2023-12-28: qty 1

## 2023-12-28 MED ORDER — EPHEDRINE 5 MG/ML INJ
INTRAVENOUS | Status: AC
  Filled 2023-12-28: qty 5

## 2023-12-28 MED ORDER — TRANEXAMIC ACID-NACL 1000-0.7 MG/100ML-% IV SOLN
1000.0000 mg | INTRAVENOUS | Status: AC
  Administered 2023-12-28: 1000 mg via INTRAVENOUS
  Filled 2023-12-28: qty 100

## 2023-12-28 MED ORDER — PROPOFOL 500 MG/50ML IV EMUL
INTRAVENOUS | Status: DC | PRN
  Administered 2023-12-28: 75 ug/kg/min via INTRAVENOUS

## 2023-12-28 MED ORDER — DIPHENHYDRAMINE HCL 12.5 MG/5ML PO ELIX: 25.0000 mg | ORAL_SOLUTION | ORAL | Status: DC | PRN

## 2023-12-28 MED ORDER — ACETAMINOPHEN 325 MG PO TABS: 325.0000 mg | ORAL_TABLET | Freq: Four times a day (QID) | ORAL | Status: DC | PRN

## 2023-12-28 MED ORDER — TOBRAMYCIN SULFATE 1.2 G IJ SOLR
INTRAMUSCULAR | Status: AC
  Filled 2023-12-28: qty 1.2

## 2023-12-28 MED ORDER — LACTATED RINGERS IV SOLN: INTRAVENOUS | Status: DC

## 2023-12-28 MED ORDER — TRANEXAMIC ACID-NACL 1000-0.7 MG/100ML-% IV SOLN: 1000.0000 mg | Freq: Once | INTRAVENOUS | Status: DC

## 2023-12-28 MED ORDER — DEXMEDETOMIDINE HCL IN NACL 80 MCG/20ML IV SOLN
INTRAVENOUS | Status: AC
  Filled 2023-12-28: qty 20

## 2023-12-28 MED ORDER — PANTOPRAZOLE SODIUM 40 MG PO TBEC
40.0000 mg | DELAYED_RELEASE_TABLET | Freq: Every day | ORAL | Status: DC
  Administered 2023-12-28 – 2023-12-29 (×2): 40 mg via ORAL
  Filled 2023-12-28 (×3): qty 1

## 2023-12-28 MED ORDER — OXYCODONE HCL 5 MG PO TABS
ORAL_TABLET | ORAL | Status: AC
Start: 2023-12-28 — End: 2023-12-29
  Filled 2023-12-28: qty 2

## 2023-12-28 MED ORDER — ONDANSETRON HCL 4 MG/2ML IJ SOLN: 4.0000 mg | Freq: Four times a day (QID) | INTRAMUSCULAR | Status: DC | PRN

## 2023-12-28 MED ORDER — MIDAZOLAM HCL 2 MG/2ML IJ SOLN
INTRAMUSCULAR | Status: DC | PRN
Start: 2023-12-28 — End: 2023-12-28
  Administered 2023-12-28: 2 mg via INTRAVENOUS

## 2023-12-28 MED ORDER — PHENYLEPHRINE 80 MCG/ML (10ML) SYRINGE FOR IV PUSH (FOR BLOOD PRESSURE SUPPORT)
PREFILLED_SYRINGE | INTRAVENOUS | Status: AC
Start: 2023-12-28 — End: ?
  Filled 2023-12-28: qty 10

## 2023-12-28 MED ORDER — DEXMEDETOMIDINE HCL IN NACL 80 MCG/20ML IV SOLN
INTRAVENOUS | Status: DC | PRN
  Administered 2023-12-28: 4 ug via INTRAVENOUS

## 2023-12-28 MED ORDER — OXYCODONE HCL 5 MG PO TABS
5.0000 mg | ORAL_TABLET | ORAL | Status: DC | PRN
  Administered 2023-12-28: 10 mg via ORAL

## 2023-12-28 MED ORDER — TRANEXAMIC ACID 1000 MG/10ML IV SOLN
INTRAVENOUS | Status: DC | PRN
  Administered 2023-12-28: 2000 mg via TOPICAL

## 2023-12-28 MED ORDER — HYDROMORPHONE HCL 1 MG/ML IJ SOLN
0.5000 mg | INTRAMUSCULAR | Status: DC | PRN
Start: 2023-12-28 — End: 2023-12-29
  Administered 2023-12-28 – 2023-12-29 (×3): 1 mg via INTRAVENOUS
  Filled 2023-12-28 (×3): qty 1

## 2023-12-28 SURGICAL SUPPLY — 52 items
BAG COUNTER SPONGE SURGICOUNT (BAG) ×2 IMPLANT
BAG DECANTER FOR FLEXI CONT (MISCELLANEOUS) ×2 IMPLANT
BLADE SAG 18X100X1.27 (BLADE) ×2 IMPLANT
COVER PERINEAL POST (MISCELLANEOUS) ×2 IMPLANT
COVER SURGICAL LIGHT HANDLE (MISCELLANEOUS) ×2 IMPLANT
DERMABOND ADVANCED .7 DNX12 (GAUZE/BANDAGES/DRESSINGS) IMPLANT
DRAPE C-ARM 42X72 X-RAY (DRAPES) ×2 IMPLANT
DRAPE POUCH INSTRU U-SHP 10X18 (DRAPES) ×2 IMPLANT
DRAPE STERI IOBAN 125X83 (DRAPES) ×2 IMPLANT
DRAPE U-SHAPE 47X51 STRL (DRAPES) ×4 IMPLANT
DRSG AQUACEL AG ADV 3.5X10 (GAUZE/BANDAGES/DRESSINGS) ×2 IMPLANT
DURAPREP 26ML APPLICATOR (WOUND CARE) ×4 IMPLANT
ELECTRODE BLDE 4.0 EZ CLN MEGD (MISCELLANEOUS) ×2 IMPLANT
ELECTRODE REM PT RTRN 9FT ADLT (ELECTROSURGICAL) ×2 IMPLANT
GLOVE BIOGEL PI IND STRL 7.0 (GLOVE) ×4 IMPLANT
GLOVE BIOGEL PI IND STRL 7.5 (GLOVE) ×10 IMPLANT
GLOVE ECLIPSE 7.0 STRL STRAW (GLOVE) ×4 IMPLANT
GLOVE SKINSENSE STRL SZ7.5 (GLOVE) ×2 IMPLANT
GLOVE SURG SYN 7.5 E (GLOVE) ×2 IMPLANT
GLOVE SURG SYN 7.5 PF PI (GLOVE) ×4 IMPLANT
GLOVE SURG UNDER POLY LF SZ7 (GLOVE) ×6 IMPLANT
GLOVE SURG UNDER POLY LF SZ7.5 (GLOVE) ×4 IMPLANT
GOWN STRL REUS W/ TWL LRG LVL3 (GOWN DISPOSABLE) IMPLANT
GOWN STRL REUS W/ TWL XL LVL3 (GOWN DISPOSABLE) ×2 IMPLANT
GOWN STRL SURGICAL XL XLNG (GOWN DISPOSABLE) ×2 IMPLANT
GOWN TOGA ZIPPER T7+ PEEL AWAY (MISCELLANEOUS) ×2 IMPLANT
HEAD CERAMIC DELTA 36 PLUS 1.5 (Hips) IMPLANT
HOOD PEEL AWAY T7 (MISCELLANEOUS) ×2 IMPLANT
IV NS IRRIG 3000ML ARTHROMATIC (IV SOLUTION) ×2 IMPLANT
KIT BASIN OR (CUSTOM PROCEDURE TRAY) ×2 IMPLANT
LINER NEUTRAL 52X36MM PLUS 4 (Liner) IMPLANT
MARKER SKIN DUAL TIP RULER LAB (MISCELLANEOUS) ×2 IMPLANT
NDL SPNL 18GX3.5 QUINCKE PK (NEEDLE) ×2 IMPLANT
NEEDLE SPNL 18GX3.5 QUINCKE PK (NEEDLE) ×1 IMPLANT
PACK TOTAL JOINT (CUSTOM PROCEDURE TRAY) ×2 IMPLANT
PACK UNIVERSAL I (CUSTOM PROCEDURE TRAY) ×2 IMPLANT
PIN SECTOR W/GRIP ACE CUP 52MM (Hips) IMPLANT
SCREW 6.5MMX25MM (Screw) IMPLANT
SET HNDPC FAN SPRY TIP SCT (DISPOSABLE) ×2 IMPLANT
SOLUTION PRONTOSAN WOUND 350ML (IRRIGATION / IRRIGATOR) ×2 IMPLANT
STEM FEMORAL SZ6 HIGH ACTIS (Stem) IMPLANT
SUT ETHIBOND 2 V 37 (SUTURE) ×2 IMPLANT
SUT ETHILON 2 0 FS 18 (SUTURE) IMPLANT
SUT VIC AB 0 CT1 27XBRD ANBCTR (SUTURE) ×2 IMPLANT
SUT VIC AB 1 CTX36XBRD ANBCTR (SUTURE) ×2 IMPLANT
SUT VIC AB 2-0 CT1 TAPERPNT 27 (SUTURE) ×4 IMPLANT
SYR 50ML LL SCALE MARK (SYRINGE) ×2 IMPLANT
TOWEL GREEN STERILE (TOWEL DISPOSABLE) ×2 IMPLANT
TRAY CATH INTERMITTENT SS 16FR (CATHETERS) IMPLANT
TRAY FOLEY W/BAG SLVR 16FR ST (SET/KITS/TRAYS/PACK) IMPLANT
TUBE SUCT ARGYLE STRL (TUBING) ×2 IMPLANT
YANKAUER SUCT BULB TIP NO VENT (SUCTIONS) ×2 IMPLANT

## 2023-12-28 NOTE — Discharge Instructions (Signed)

## 2023-12-28 NOTE — Op Note (Signed)
 ARTHROPLASTY, HIP, TOTAL, ANTERIOR APPROACH  Procedure Note Allyse  L Glenna Lango   [id number]  Pre-op Diagnosis: left hip osteoarthritis     Post-op Diagnosis: same  Operative Findings Joint effusion, synovitis Osteophytic spurring femoral neck Complete loss of articular cartilage   Operative Procedures  1. Total hip replacement; Left hip; uncemented [id number]   Surgeon: Dyana Glade, M.D.  Assist: Sharran Decent, PA-C   Anesthesia: spinal  Prosthesis: Depuy Acetabulum: Pinnacle 52 mm Femur: Actis 6 HO Head: 36 mm size: +1.5 Liner: +4 Bearing Type: ceramic/poly  Total Hip Arthroplasty (Anterior Approach) Op Note:  After informed consent was obtained and the operative extremity marked in the holding area, the patient was brought back to the operating room and placed supine on the HANA table. Next, the operative extremity was prepped and draped in normal sterile fashion. Surgical timeout occurred verifying patient identification, surgical site, surgical procedure and administration of antibiotics.  A 10 cm longitudinal incision was made starting from 2 fingerbreadths lateral and inferior to the ASIS towards the lateral aspect of the patella.  A Hueter approach to the hip was performed, using the interval between tensor fascia lata and sartorius.  Dissection was carried bluntly down onto the anterior hip capsule. The lateral femoral circumflex vessels were identified and coagulated. A capsulotomy was performed and the capsular flaps tagged for later repair.  The neck osteotomy was performed. The femoral head was removed which showed severe wear, the acetabular rim was cleared of soft tissue and osteophytes and attention was turned to reaming the acetabulum.  Sequential reaming was performed under fluoroscopic guidance down to the floor of the cotyloid fossa. We reamed to a size 52 mm down to good bleeding bone, and then impacted the acetabular shell. A 25 mm cancellous screw was  placed to secure the shell.  The liner was then placed after irrigation and attention turned to the femur.  After placing the femoral hook, the leg was taken to externally rotated, extended and adducted position taking care to perform soft tissue releases to allow for adequate mobilization of the femur. Soft tissue was cleared from the shoulder of the greater trochanter and the hook elevator used to improve exposure of the proximal femur. Sequential broaching performed up to a size 6.  Standard offset trial neck and +1.5 head were placed. The leg was brought back up to neutral and the construct reduced.  The position and sizing of components, offset and leg lengths were checked using fluoroscopy.  Stability of the construct was checked in 45 degrees of hip extension and 90 degrees of external rotation.  Leg lengths were equal but the hip was not stable therefore, we trialed a high offset neck and this corrected the subluxation without adding leg length.  We dislocated the prosthesis, dropped the leg back into position, removed trial components, and irrigated copiously. The final stem and head was then placed, the leg brought back up, the system reduced and fluoroscopy used to verify positioning.  Antibiotic irrigation was placed in the surgical wound.   We irrigated, obtained hemostasis and closed the capsule using #2 ethibond suture.  A topical mixture of 0.25% bupivacaine  and meloxicam was placed deep to the fascia.  One gram of vancomycin  powder was placed in the surgical bed.   One gram of topical tranexamic acid  was injected into the joint.  The fascia was closed with #1 stratafix, the deep fat layer was closed with 0 vicryl, the subcutaneous layers closed with 2.0 Vicryl Plus and  the skin closed with 2.0 nylon and dermabond. A sterile dressing was applied. The patient was awakened in the operating room and taken to recovery in stable condition.  All sponge, needle, and instrument counts were correct at the  end of the case.   Trellis Fries, my PA, was a medical necessity for opening, closing, limb positioning, retracting, exposing, and overall facilitation and timely completion of the surgery.  Position: supine  Complications: see description of procedure.  Time Out: performed   Drains/Packing: none  Estimated blood loss: see anesthesia record  Returned to Recovery Room: in good condition.   Antibiotics: yes   Mechanical VTE (DVT) Prophylaxis: sequential compression devices, TED thigh-high  Chemical VTE (DVT) Prophylaxis: xarelto     Fluid Replacement: see anesthesia record  Specimens Removed: 1 to pathology   Sponge and Instrument Count Correct? yes   PACU: portable radiograph - low AP   Plan/RTC: Return in 2 weeks for staple removal. Weight Bearing/Load Lower Extremity: full  Hip precautions: none Suture Removal: 2 weeks   N. Claria Crofts, MD Azucena Bollard 1:26 PM   Implant Name Type Inv. Item Serial No. Manufacturer Lot No. LRB No. Used Action  PIN SECTOR W/GRIP ACE CUP - [id number] Hips PIN SECTOR W/GRIP ACE CUP  [employer] [id number] Left 1 Implanted  SCREW 6.5MMX25MM - [id number] Screw SCREW 6.5MMX25MM  [employer] [id number] Left 1 Implanted  LINER NEUTRAL 52X36MM PLUS 4 - [id number] Liner LINER NEUTRAL 52X36MM PLUS 4  [employer] [id number] Left 1 Implanted  STEM FEMORAL SZ6 HIGH ACTIS - [id number] Stem STEM FEMORAL SZ6 HIGH ACTIS  [employer] [id number] Left 1 Implanted  HEAD CERAMIC DELTA 36 PLUS 1.5 - [id number] Hips HEAD CERAMIC DELTA 36 PLUS 1.5  [employer] [id number] Left 1 Implanted

## 2023-12-28 NOTE — Plan of Care (Signed)

## 2023-12-28 NOTE — Progress Notes (Signed)
 PT Cancellation Note  Patient Details Name: Kari  VARONICA [name] MRN: [id number] DOB: [birth year]   Cancelled Treatment:    Reason Eval/Treat Not Completed: Medical issues which prohibited therapy. Pt reports continued numbness in BLE from nerve block, defers PT eval until tomorrow morning.   Rexie Catena 12/28/2023, 6:06 PM

## 2023-12-28 NOTE — Anesthesia Procedure Notes (Signed)
 Spinal  Patient location during procedure: OR Start time: 12/28/2023 12:10 PM End time: 12/28/2023 12:14 PM Reason for block: surgical anesthesia Staffing Performed: anesthesiologist  Anesthesiologist: Conard Decent, MD Performed by: Conard Decent, MD Authorized by: Conard Decent, MD   Preanesthetic Checklist Completed: patient identified, IV checked, site marked, risks and benefits discussed, surgical consent, monitors and equipment checked, pre-op evaluation and timeout performed Spinal Block Patient position: sitting Prep: DuraPrep Patient monitoring: blood pressure and continuous pulse ox Approach: midline Location: L3-4 Injection technique: single-shot Needle Needle type: Pencan  Needle gauge: 24 G Needle length: 9 cm Additional Notes Risks and benefits of neuraxial anesthesia including, but not limited to, infection, bleeding, local anesthetic toxicity, headache, hypotension, back pain, block failure, etc. were discussed with the patient. The patient expressed understanding and consented to the procedure. I confirmed that the patient has no bleeding disorders and is not taking blood thinners. I confirmed the patient's last platelet count with the nurse. Monitors were applied. A time-out was performed immediately prior to the procedure. Sterile technique was used throughout the whole procedure.   1 attempt(s)

## 2023-12-28 NOTE — Anesthesia Postprocedure Evaluation (Signed)
 Anesthesia Post Note  Patient: Kari  L [name]  Procedure(s) Performed: ARTHROPLASTY, HIP, TOTAL, ANTERIOR APPROACH (Left: Hip)     Patient location during evaluation: PACU Anesthesia Type: MAC and Spinal Level of consciousness: awake Pain management: pain level controlled Vital Signs Assessment: post-procedure vital signs reviewed and stable Respiratory status: spontaneous breathing, respiratory function stable and nonlabored ventilation Cardiovascular status: blood pressure returned to baseline and stable Postop Assessment: no headache, no backache and no apparent nausea or vomiting Anesthetic complications: no   No notable events documented.  Last Vitals:  Vitals:   12/28/23 1615 12/28/23 1630  BP: 110/70 120/66  Pulse: 64 68  Resp: 12 14  Temp: (!) 36.1 C (!) 36.3 C  SpO2: 94% 91%    Last Pain:  Vitals:   12/28/23 1540  TempSrc:   PainSc: 7                  Conard Decent

## 2023-12-28 NOTE — Transfer of Care (Signed)
 Immediate Anesthesia Transfer of Care Note  Patient: Kari [name]  L Bilyk  Procedure(s) Performed: ARTHROPLASTY, HIP, TOTAL, ANTERIOR APPROACH (Left: Hip)  Patient Location: PACU  Anesthesia Type:Spinal  Level of Consciousness: awake and drowsy  Airway & Oxygen Therapy: Patient Spontanous Breathing and Patient connected to nasal cannula oxygen  Post-op Assessment: Report given to RN, Post -op Vital signs reviewed and stable, and Patient able to stick tongue midline  Post vital signs: Reviewed and stable  Last Vitals:  Vitals Value Taken Time  BP 132/61 12/28/23 1415  Temp 36.1   Pulse 60 12/28/23 1417  Resp 11 12/28/23 1417  SpO2 96 % 12/28/23 1417  Vitals shown include unfiled device data.  Last Pain:  Vitals:   12/28/23 0958  TempSrc:   PainSc: 2       Patients Stated Pain Goal: 3 (12/28/23 0981)  Complications: No notable events documented.

## 2023-12-28 NOTE — H&P (Signed)
 PREOPERATIVE H&P  Chief Complaint: left hip osteoarthritis  HPI: Kari [name] is a 73 y.o. female who presents for surgical treatment of left hip osteoarthritis.  She denies any changes in medical history.  Past Surgical History:  Procedure Laterality Date   ACHILLES TENDON SURGERY     ARTHROGRAM KNEE Right 1975   CHOLECYSTECTOMY  1990's   colonscopy     ESOPHAGOGASTRODUODENOSCOPY     EYE SURGERY     muscle release right eye   INSERT / REPLACE / REMOVE PACEMAKER  02/17/2013   [employer] Advantio dual-chamber pacemaker, model [id number],   KNEE ARTHROSCOPY Right 1982, 1983, 1995, 1996   KNEE ARTHROSCOPY W/ ACL RECONSTRUCTION Left 1984   no reconstruction needed   LAPAROSCOPIC SIGMOID COLECTOMY N/A 01/01/2015   Procedure: LAPAROSCOPIC SIGMOID COLECTOMY;  Surgeon: Lockie Rima, MD;  Location: [facility] OR;  Service: General;  Laterality: N/A;   LEAD EXTRACTION N/A 03/24/2022   Procedure: LEAD EXTRACTION;  Surgeon: Tammie Fall, MD;  Location: [facility] INVASIVE CV LAB;  Service: Cardiovascular;  Laterality: N/A;   PACEMAKER IMPLANT N/A 03/24/2022   Procedure: PACEMAKER IMPLANT;  Surgeon: Tammie Fall, MD;  Location: [facility] INVASIVE CV LAB;  Service: Cardiovascular;  Laterality: N/A;   PERMANENT PACEMAKER INSERTION N/A 02/17/2013   Procedure: PERMANENT PACEMAKER INSERTION;  Surgeon: Tammie Fall, MD;  Location: [facility] CATH LAB;  Service: Cardiovascular;  Laterality: N/A;   SHOULDER ARTHROSCOPY Left 09/05/2016   Procedure: LEFT SHOULDER ARTHROSCOPY , ACROMIOPLASTY AND ROTATOR CUFF REPAIR;  Surgeon: Dayne Even, MD;  Location: [facility] OR;  Service: Orthopedics;  Laterality: Left;   TONSILLECTOMY  1961   TOTAL KNEE ARTHROPLASTY Right 2000; 06/2000   "replaced w/appropriate hardware" (02/17/2013)   TOTAL KNEE REVISION Right 04/13/2019   Procedure: Revision Right Knee Arthroplasty;  Surgeon: Wendolyn Hamburger, MD;  Location: [facility] ORS;  Service: Orthopedics;  Laterality: Right;   TRIGGER FINGER  RELEASE Right ~ 2012   "thumb" (02/17/2013)   VAGINAL HYSTERECTOMY  ~ 1994   partial    Social History   Socioeconomic History   Marital status: Married    Spouse name: Not on file   Number of children: Not on file   Years of education: Not on file   Highest education level: Not on file  Occupational History   Occupation: Teacher, 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[employer]    Comment: [employer]  Tobacco Use   Smoking status: Never   Smokeless tobacco: Never  Vaping Use   Vaping status: Never Used  Substance and Sexual Activity   Alcohol use: Not Currently    Comment: occasional wine    Drug use: No   Sexual activity: Yes    Birth control/protection: Surgical    Comment: 02/17/2013 "I have a same sex partner"  Other Topics Concern   Not on file  Social History Narrative   Not on file   Social Drivers of Health   Financial Resource Strain: Not on file  Food Insecurity: Not on file  Transportation Needs: Not on file  Physical Activity: Not on file  Stress: Not on file  Social Connections: Not on file   Family History  Problem Relation Age of Onset   Hypertension Mother    Hyperlipidemia Mother    Macular degeneration Mother    COPD Mother    Lung cancer Father    Cancer Father        Lung   COPD Father  Diabetes Brother    Breast cancer Paternal Aunt    Allergies  Allergen Reactions   Eliquis  [Apixaban ] Palpitations    Diaphoresis, tachycardia   Bactrim  [Sulfamethoxazole -Trimethoprim ] Hives   Ceftin Itching    Tolerates amoxicillin    Vancomycin  Hives   Doxycycline      Difficulty swallowing.    Levofloxacin Other (See Comments)    "spacey," tolerates Cipro    Prior to Admission medications   Medication Sig Start Date End Date Taking? Authorizing Provider  B Complex-C (SUPER B COMPLEX PO) Take 2 capsules by mouth every evening.   Yes [provider]  Calcium -Magnesium -Vitamin D  (CITRACAL CALCIUM +D) 600-40-500 MG-MG-UNIT TB24 Take 1 tablet by  mouth every evening.   Yes [provider]  Cholecalciferol  (VITAMIN D3) 125 MCG (5000 UT) CAPS Take 5,000 Units by mouth every evening.   Yes [provider]  Coenzyme Q10 (COQ10) 200 MG CAPS Take 200 mg by mouth every evening.   Yes [provider]  Cyanocobalamin  (VITAMIN B-12 SL) Take 1 tablet by mouth every evening.   Yes [provider]  diclofenac  sodium (VOLTAREN ) 1 % GEL Apply 1-2 g topically 4 (four) times daily as needed (knee pain.).   Yes [provider]  EPINEPHrine  0.3 mg/0.3 mL IJ SOAJ injection Inject 0.3 mg into the muscle as needed for anaphylaxis. 07/31/23  Yes Neil Balls A, PA  esomeprazole (NEXIUM) 20 MG capsule Take 20 mg by mouth daily in the afternoon.   Yes [provider]  fluticasone  (FLONASE ) 50 MCG/ACT nasal spray Place 1 spray into both nostrils daily as needed for allergies or rhinitis.   Yes [provider]  levothyroxine  (SYNTHROID ) 25 MCG tablet Take 1 tablet (25 mcg total) by mouth daily before breakfast. 04/24/23  Yes   metoprolol  succinate (TOPROL  XL) 25 MG 24 hr tablet Take 1 tablet (25 mg total) by mouth daily. 11/11/23  Yes Tammie Fall, MD  Multiple Vitamin (MULTIVITAMIN WITH MINERALS) TABS Take 1 tablet by mouth every evening. Women 50+   Yes [provider]  Multiple Vitamins-Minerals (AIRBORNE GUMMIES PO) Take 1 Dose by mouth daily. Nature Made Wellblends ImmuneMAX   Yes [provider]  Omega-3 Fatty Acids (FISH OIL  ULTRA PO) Take 2,400 mg by mouth every evening.   Yes [provider]  phenazopyridine  (PYRIDIUM ) 200 MG tablet Take 1 tablet (200 mg total) by mouth 3 (three) times daily. 12/09/23  Yes Onetha Bile, MD  Probiotic Product (PROBIOTIC FORMULA PO) Take 1 capsule by mouth every evening.   Yes [provider]  rivaroxaban  (XARELTO ) 20 MG TABS tablet Take 1 tablet (20 mg total) by mouth daily with supper. Patient taking differently: Take 20  mg by mouth daily at 4 PM. 12/03/23  Yes Ollis, Brandi L, NP  rosuvastatin  (CRESTOR ) 20 MG tablet Take 1 tablet (20 mg total) by mouth daily. 12/25/23  Yes Jobe Mulder, DO  valACYclovir  (VALTREX ) 1000 MG tablet Take 1 tablet (1,000 mg total) by mouth daily as needed. 12/01/23  Yes Jobe Mulder, DO  docusate sodium  (COLACE) 100 MG capsule Take 1 capsule (100 mg total) by mouth daily as needed. 12/23/23 12/22/24  Sandie Cross, PA-C  hydrOXYzine  (ATARAX ) 25 MG tablet Take 1 tablet (25 mg total) by mouth every 8 (eight) hours as needed (Bladder spasms). Patient not taking: Reported on 12/16/2023 12/09/23   Onetha Bile, MD  methocarbamol  (ROBAXIN -750) 750 MG tablet Take 1 tablet (750 mg total) by mouth 3 (three) times daily as needed  for muscle spasms. 12/23/23   Sandie Cross, PA-C  metoprolol  tartrate (LOPRESSOR ) 100 MG tablet Take 1 tablet (100 mg total) by mouth once for 1 dose. Take 90-120 minutes prior to scan. Hold for SBP less than 110. 12/03/23 12/04/23  Thomasena Fleming, NP  ondansetron  (ZOFRAN ) 4 MG tablet Take 1 tablet (4 mg total) by mouth every 8 (eight) hours as needed for nausea or vomiting. 12/23/23   Sandie Cross, PA-C  oxyCODONE -acetaminophen  (PERCOCET) 5-325 MG tablet Take 1-2 tablets by mouth every 6 (six) hours as needed. To be taken after surgery 12/23/23   Sandie Cross, PA-C  rivaroxaban  (XARELTO ) 20 MG TABS tablet Take 1 tablet (20 mg total) by mouth daily with supper. Patient not taking: Reported on 12/16/2023 12/03/23   Thomasena Fleming, NP     Positive ROS: All other systems have been reviewed and were otherwise negative with the exception of those mentioned in the HPI and as above.  Physical Exam: General: Alert, no acute distress Cardiovascular: No pedal edema Respiratory: No cyanosis, no use of accessory musculature GI: abdomen soft Skin: No lesions in the area of chief complaint Neurologic: Sensation intact distally Psychiatric: Patient is  competent for consent with normal mood and affect Lymphatic: no lymphedema  MUSCULOSKELETAL: exam stable  Assessment: left hip osteoarthritis  Plan: Plan for Procedure(s): ARTHROPLASTY, HIP, TOTAL, ANTERIOR APPROACH  The risks benefits and alternatives were discussed with the patient including but not limited to the risks of nonoperative treatment, versus surgical intervention including infection, bleeding, nerve injury,  blood clots, cardiopulmonary complications, morbidity, mortality, among others, and they were willing to proceed.   Claria Crofts, MD 12/28/2023 10:21 AM

## 2023-12-28 NOTE — Telephone Encounter (Signed)
 Attempted to call patient to discuss CT coronary results early am (around 0800) and again now (2:55 pm).  Message left x2 for patient.  Can now see in chart that she underwent a total hip arthroplasty today.  Will attempt to contact her once she has recovered from anesthesia to discuss results.     CT with CCS of 21 / 14th percentile for matched controls. Normal coronary origin with right dominance.  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 Recommendations for risk factor modification per guideline directed care and anti-ischemic pharmacotherapy.    Will plan to have patient follow up with Cardiology for risk factor modification. Currently on low dose beta blocker, Xarelto  for AF.     Creighton Doffing, NP-C, AGACNP-BC [facility] - Electrophysiology  12/28/2023, 3:00 PM

## 2023-12-29 ENCOUNTER — Encounter (HOSPITAL_COMMUNITY): Payer: Self-pay | Admitting: Orthopaedic Surgery

## 2023-12-29 ENCOUNTER — Telehealth: Payer: Self-pay | Admitting: Orthopaedic Surgery

## 2023-12-29 DIAGNOSIS — Z79899 Other long term (current) drug therapy: Secondary | ICD-10-CM | POA: Diagnosis not present

## 2023-12-29 DIAGNOSIS — Z96651 Presence of right artificial knee joint: Secondary | ICD-10-CM | POA: Diagnosis not present

## 2023-12-29 DIAGNOSIS — Z950 Presence of cardiac pacemaker: Secondary | ICD-10-CM | POA: Diagnosis not present

## 2023-12-29 DIAGNOSIS — Z7901 Long term (current) use of anticoagulants: Secondary | ICD-10-CM | POA: Diagnosis not present

## 2023-12-29 DIAGNOSIS — E039 Hypothyroidism, unspecified: Secondary | ICD-10-CM | POA: Diagnosis not present

## 2023-12-29 DIAGNOSIS — M1612 Unilateral primary osteoarthritis, left hip: Secondary | ICD-10-CM | POA: Diagnosis not present

## 2023-12-29 DIAGNOSIS — Z86718 Personal history of other venous thrombosis and embolism: Secondary | ICD-10-CM | POA: Diagnosis not present

## 2023-12-29 NOTE — Plan of Care (Signed)
  Problem: Clinical Measurements: Goal: Will remain free from infection Outcome: Progressing   Problem: Clinical Measurements: Goal: Diagnostic test results will improve Outcome: Progressing   Problem: Clinical Measurements: Goal: Cardiovascular complication will be avoided Outcome: Progressing   Problem: Nutrition: Goal: Adequate nutrition will be maintained Outcome: Progressing   Problem: Coping: Goal: Level of anxiety will decrease Outcome: Progressing   Problem: Activity: Goal: Risk for activity intolerance will decrease Outcome: Progressing

## 2023-12-29 NOTE — Evaluation (Signed)
 Physical Therapy Evaluation Patient Details Name: Kari [name] MRN: [id number] DOB: 1951/08/13 Today's Date: 12/29/2023  History of Present Illness  73 y.o. female presents to [facility] hospital on 12/28/2023 for elective L THA. PMH includes GERD, diverticulosis, R TKA, CHB, DVT, HTN.  Clinical Impression  Pt presents to PT with deficits in functional mobility, gait, balance, strength, ROM. Pt is able to ambulate for household distances with support of the RW. PT provides education on the THA exercise packet and encourages frequent mobilization with staff assistance. PT recommends discharge home when medically appropriate.     If plan is discharge home, recommend the following: Assist for transportation;Assistance with cooking/housework   Can travel by private vehicle        Equipment Recommendations Rolling walker (2 wheels);BSC/3in1  Recommendations for Other Services       Functional Status Assessment Patient has had a recent decline in their functional status and demonstrates the ability to make significant improvements in function in a reasonable and predictable amount of time.     Precautions / Restrictions Precautions Precautions: Fall Recall of Precautions/Restrictions: Intact Precaution/Restrictions Comments: direct anterior THA Restrictions Weight Bearing Restrictions Per Provider Order: Yes LLE Weight Bearing Per Provider Order: Weight bearing as tolerated      Mobility  Bed Mobility Overal bed mobility: Modified Independent                  Transfers Overall transfer level: Modified independent                      Ambulation/Gait Ambulation/Gait assistance: Modified independent (Device/Increase time) Gait Distance (Feet): 200 Feet Assistive device: Rolling walker (2 wheels) Gait Pattern/deviations: Step-through pattern Gait velocity: reduced Gait velocity interpretation: 1.31 - 2.62 ft/sec, indicative of limited community ambulator   General  Gait Details: slowed step-through gait  Stairs Stairs:  (verbal education on stair negotiation technique)          Wheelchair Mobility     Tilt Bed    Modified Rankin (Stroke Patients Only)       Balance Overall balance assessment: Needs assistance Sitting-balance support: No upper extremity supported, Feet supported Sitting balance-Leahy Scale: Good     Standing balance support: Single extremity supported, Reliant on assistive device for balance Standing balance-Leahy Scale: Poor                               Pertinent Vitals/Pain Pain Assessment Pain Assessment: 0-10 Pain Score: 7  Pain Location: L hip Pain Descriptors / Indicators: Sore Pain Intervention(s): Monitored during session    Home Living Family/patient expects to be discharged to:: Private residence Living Arrangements: Spouse/significant other Available Help at Discharge: Family;Available 24 hours/day Type of Home: House Home Access: Stairs to enter Entrance Stairs-Rails: None Entrance Stairs-Number of Steps: 1   Home Layout: One level Home Equipment: Cane - single point      Prior Function Prior Level of Function : Independent/Modified Independent;Driving;Working/employed                     Extremity/Trunk Assessment   Upper Extremity Assessment Upper Extremity Assessment: Overall WFL for tasks assessed    Lower Extremity Assessment Lower Extremity Assessment: LLE deficits/detail LLE Deficits / Details: generalized post-op weakness    Cervical / Trunk Assessment Cervical / Trunk Assessment: Normal  Communication   Communication Communication: No apparent difficulties    Cognition Arousal: Alert Behavior During Therapy:  WFL for tasks assessed/performed   PT - Cognitive impairments: No apparent impairments                         Following commands: Intact       Cueing Cueing Techniques: Verbal cues     General Comments General comments  (skin integrity, edema, etc.): VSS on RA    Exercises     Assessment/Plan    PT Assessment Patient needs continued PT services  PT Problem List Decreased strength;Decreased activity tolerance;Decreased balance;Decreased mobility;Decreased knowledge of use of DME;Pain       PT Treatment Interventions DME instruction;Gait training;Stair training;Functional mobility training;Therapeutic activities;Therapeutic exercise;Balance training;Neuromuscular re-education;Patient/family education    PT Goals (Current goals can be found in the Care Plan section)  Acute Rehab PT Goals Patient Stated Goal: to return to independence PT Goal Formulation: With patient Time For Goal Achievement: 01/02/24 Potential to Achieve Goals: Good    Frequency 7X/week     Co-evaluation               AM-PAC PT "6 Clicks" Mobility  Outcome Measure Help needed turning from your back to your side while in a flat bed without using bedrails?: None Help needed moving from lying on your back to sitting on the side of a flat bed without using bedrails?: None Help needed moving to and from a bed to a chair (including a wheelchair)?: None Help needed standing up from a chair using your arms (e.g., wheelchair or bedside chair)?: None Help needed to walk in hospital room?: None Help needed climbing 3-5 steps with a railing? : A Little 6 Click Score: 23    End of Session   Activity Tolerance: Patient tolerated treatment well Patient left: in bed;with call bell/phone within reach Nurse Communication: Mobility status PT Visit Diagnosis: Other abnormalities of gait and mobility (R26.89);Muscle weakness (generalized) (M62.81)    Time: 1191-4782 PT Time Calculation (min) (ACUTE ONLY): 34 min   Charges:   PT Evaluation $PT Eval Low Complexity: 1 Low   PT General Charges $$ ACUTE PT VISIT: 1 Visit         Rexie Catena, PT, DPT Acute Rehabilitation Office [phone]   Rexie Catena 12/29/2023, 9:39 AM

## 2023-12-29 NOTE — TOC Initial Note (Signed)
 Transition of Care (TOC) - Initial/Assessment Note   Spoke to patient at bedside. Patient from home with spouse.   Discussed MD orders for home health PT, bedside commode/ 3in 1 and rolling walker.   Dr Christiane Cowing office has arranged home health PT with [facility]. NCM confined with Lynette with [facility].   Ordered rolling walker with Mitch with [facility] to come to room this morning.    Patient Details  Name: Kari [name] MRN: [id number] Date of Birth: Apr 08, 1951  Transition of [facility]) CM/SW Contact:    Terre Ferri, RN Phone Number: 12/29/2023, 8:44 AM  Clinical Narrative:                   Expected Discharge Plan: Home w Home Health Services Barriers to Discharge: Continued Medical Work up   Patient Goals and CMS Choice Patient states their goals for this hospitalization and ongoing recovery are:: to return to home CMS Medicare.gov Compare Post Acute Care list provided to:: Patient Choice offered to / list presented to : Patient      Expected Discharge Plan and Services   Discharge Planning Services: CM Consult Post Acute Care Choice: Home Health, Durable Medical Equipment Living arrangements for the past 2 months: Single Family Home Expected Discharge Date: 12/29/23               DME Arranged: Merlyn Starring rolling DME Agency: [facility] Date DME Agency Contacted: 12/29/23 Time DME Agency Contacted: [phone] Representative spoke with at DME Agency: Harriet Limber HH Arranged: PT HH Agency: [facility] Date [facility] Agency Contacted: 12/29/23 Time HH Agency Contacted: [phone] Representative spoke with at [facility] Agency: Mitch  Prior Living Arrangements/Services Living arrangements for the past 2 months: Single Family Home Lives with:: Spouse Patient language and need for interpreter reviewed:: Yes Do you feel safe going back to the place where you live?: Yes      Need for Family Participation in Patient Care: Yes (Comment) Care giver support system in place?: Yes  (comment)   Criminal Activity/Legal Involvement Pertinent to Current Situation/Hospitalization: No - Comment as needed  Activities of Daily Living      Permission Sought/Granted   Permission granted to share information with : Yes, Verbal Permission Granted     Permission granted to share info w AGENCY: [facility]        Emotional Assessment Appearance:: Appears stated age Attitude/Demeanor/Rapport: Engaged Affect (typically observed): Appropriate Orientation: : Oriented to Self, Oriented to Place, Oriented to  Time, Oriented to Situation Alcohol / Substance Use: Not Applicable Psych Involvement: No (comment)  Admission diagnosis:  Primary osteoarthritis of left hip [M16.12] Status post total replacement of left hip [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] Patient Active Problem List   Diagnosis Date Noted   Status post total replacement of left hip 12/28/2023   Primary osteoarthritis of left hip 12/27/2023   Hypertension 07/10/2022   Acute blood loss anemia 04/01/2022   DVT of left UE  04/01/2022   Complete heart block (HCC) 03/24/2022   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   Aortic atherosclerosis (HCC) 03/07/2022   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   Painful total knee replacement, right (HCC) 04/12/2019   Near syncope 03/11/2013   Bradycardia 02/21/2013   Pacemaker 02/20/2013   Chest tightness 02/19/2013   Palpitations 02/19/2013   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   Diverticulitis 03/03/2012   Chronic low back pain 08/28/2011   GERD (gastroesophageal reflux disease)    Herpes zoster conjunctivitis  Menopause    Arthritis    Diverticulosis    Allergy    PCP:  Jobe Mulder, DO Pharmacy:   [facility] [id number] - [address] [place] - [address] [address] Phone: [phone] Fax: [phone]  [facility] [address] [place]  [address] Phone: [phone] Fax: [phone]     Social Drivers of Health (SDOH) Social History: SDOH Screenings   Food Insecurity: No Food Insecurity (12/28/2023)  Housing: Low Risk  (12/28/2023)  Transportation Needs: No Transportation Needs (12/28/2023)  Utilities: Not At Risk (12/28/2023)  Depression (PHQ2-9): Low Risk  (04/24/2023)  Social Connections: Socially Isolated (12/28/2023)  Tobacco Use: Low Risk  (12/28/2023)   SDOH Interventions:     Readmission Risk Interventions     No data to display

## 2023-12-29 NOTE — Progress Notes (Addendum)
 Subjective: 1 Day Post-Op Procedure(s) (LRB): ARTHROPLASTY, HIP, TOTAL, ANTERIOR APPROACH (Left) Patient reports pain as moderate.  C/o increased symptoms of GERD and nausea.    Objective: Vital signs in last 24 hours: Temp:  [95 F (35 C)-97.7 F (36.5 C)] 97.6 F (36.4 C) (05/06 0428) Pulse Rate:  [59-72] 59 (05/06 0428) Resp:  [9-18] 18 (05/06 0428) BP: (104-144)/(55-82) 107/71 (05/06 0428) SpO2:  [91 %-97 %] 97 % (05/06 0428) Weight:  [72.6 kg] 72.6 kg (05/05 0923)  Intake/Output from previous day: 05/05 0701 - 05/06 0700 In: 1000 [I.V.:1000] Out: 600 [Urine:400; Blood:200] Intake/Output this shift: No intake/output data recorded.  No results for input(s): "HGB" in the last 72 hours. No results for input(s): "WBC", "RBC", "HCT", "PLT" in the last 72 hours. No results for input(s): "NA", "K", "CL", "CO2", "BUN", "CREATININE", "GLUCOSE", "CALCIUM " in the last 72 hours. No results for input(s): "LABPT", "INR" in the last 72 hours.  Neurologically intact Neurovascular intact Sensation intact distally Intact pulses distally Dorsiflexion/Plantar flexion intact Incision: dressing C/D/I No cellulitis present Compartment soft   Assessment/Plan: 1 Day Post-Op Procedure(s) (LRB): ARTHROPLASTY, HIP, TOTAL, ANTERIOR APPROACH (Left) Advance diet Up with therapy D/C IV fluids Discharge home with home health once cleared by PT WBAT LLE       Sandie Cross 12/29/2023, 8:23 AM

## 2023-12-29 NOTE — TOC CM/SW Note (Cosign Needed)
 Patient confined to a room with no bathroom therefore needs a bedside commode

## 2023-12-29 NOTE — Discharge Summary (Signed)
 Patient ID: Kari [name] MRN: [id number] DOB/AGE: Jul 03, 1951 73 y.o.  Admit date: 12/28/2023 Discharge date: 12/29/2023  Admission Diagnoses:  Principal Problem:   Primary osteoarthritis of left hip Active Problems:   Status post total replacement of left hip   Discharge Diagnoses:  Same  Past Medical History:  Diagnosis Date   Arthritis    Chronic back pain    Diverticulitis    DVT (deep venous thrombosis) (HCC) age 23   s/p  knee arthroscopy    GERD (gastroesophageal reflux disease)    Herpes zoster conjunctivitis    Hypothyroidism    Menopause    Mobitz type 2 second degree AV block    2:1/notes 02/16/2013   Pneumonia 2014   PONV (postoperative nausea and vomiting)    Presence of permanent cardiac pacemaker    Seasonal allergies    "spring & fall; not q year" (02/17/2013)   Shoulder impingement    and left rotator cuff tear    Surgeries: Procedure(s): ARTHROPLASTY, HIP, TOTAL, ANTERIOR APPROACH on 12/28/2023   Consultants:   Discharged Condition: Improved  Hospital Course: Kari [name] is an 73 y.o. female who was admitted 12/28/2023 for operative treatment ofPrimary osteoarthritis of left hip. Patient has severe unremitting pain that affects sleep, daily activities, and work/hobbies. After pre-op clearance the patient was taken to the operating room on 12/28/2023 and underwent  Procedure(s): ARTHROPLASTY, HIP, TOTAL, ANTERIOR APPROACH.    Patient was given perioperative antibiotics:  Anti-infectives (From admission, onward)    Start     Dose/Rate Route Frequency Ordered Stop   12/28/23 1745  ceFAZolin  (ANCEF ) IVPB 2g/100 mL premix        2 g 200 mL/hr over 30 Minutes Intravenous Every 6 hours 12/28/23 1658 12/29/23 0026   12/28/23 1244  tobramycin (NEBCIN) powder  Status:  Discontinued          As needed 12/28/23 1245 12/28/23 1352   12/28/23 0930  ceFAZolin  (ANCEF ) IVPB 2g/100 mL premix        2 g 200 mL/hr over 30 Minutes Intravenous On call to O.R.  12/28/23 0917 12/28/23 1210        Patient was given sequential compression devices, early ambulation, and chemoprophylaxis to prevent DVT.  Patient benefited maximally from hospital stay and there were no complications.    Recent vital signs: Patient Vitals for the past 24 hrs:  BP Temp Temp src Pulse Resp SpO2 Height Weight  12/29/23 0428 107/71 97.6 F (36.4 C) -- (!) 59 18 97 % -- --  12/29/23 0004 110/74 97.6 F (36.4 C) -- (!) 59 18 93 % -- --  12/28/23 2013 104/67 -- -- 72 17 95 % -- --  12/28/23 1725 124/70 (!) 97.4 F (36.3 C) -- 70 17 93 % -- --  12/28/23 1630 120/66 (!) 97.3 F (36.3 C) -- 68 14 91 % -- --  12/28/23 1615 110/70 (!) 97 F (36.1 C) -- 64 12 94 % -- --  12/28/23 1600 120/68 (!) 97 F (36.1 C) -- 66 14 95 % -- --  12/28/23 1545 127/71 (!) 96.3 F (35.7 C) -- 72 12 95 % -- --  12/28/23 1540 -- (!) 96.1 F (35.6 C) -- 65 13 96 % -- --  12/28/23 1530 123/65 (!) 95.9 F (35.5 C) -- 61 13 96 % -- --  12/28/23 1515 110/65 (!) 95.5 F (35.3 C) -- 60 12 95 % -- --  12/28/23 1500 113/62 (!) 95.2 F (35.1 C) --  60 12 95 % -- --  12/28/23 1445 (!) 118/55 (!) 95 F (35 C) -- 60 (!) 9 96 % -- --  12/28/23 1430 120/66 (!) 95 F (35 C) -- 60 11 95 % -- --  12/28/23 1415 132/61 -- -- 60 14 96 % -- --  12/28/23 1400 125/65 -- -- 60 17 95 % -- --  12/28/23 0923 (!) 144/82 97.7 F (36.5 C) Oral 68 18 94 % 5\' 8"  (1.727 m) 72.6 kg     Recent laboratory studies: No results for input(s): "WBC", "HGB", "HCT", "PLT", "NA", "K", "CL", "CO2", "BUN", "CREATININE", "GLUCOSE", "INR", "CALCIUM " in the last 72 hours.  Invalid input(s): "PT", "2"   Discharge Medications:   Allergies as of 12/29/2023       Reactions   Eliquis  [apixaban ] Palpitations   Diaphoresis, tachycardia   Bactrim  [sulfamethoxazole -trimethoprim ] Hives   Ceftin Itching   Tolerates amoxicillin    Vancomycin  Hives   Doxycycline     Difficulty swallowing.    Levofloxacin Other (See Comments)    "spacey," tolerates Cipro         Medication List     STOP taking these medications    FISH OIL  ULTRA PO   hydrOXYzine  25 MG tablet Commonly known as: ATARAX        TAKE these medications    AIRBORNE GUMMIES PO Take 1 Dose by mouth daily. [employer] Made Wellblends ImmuneMAX   CITRACAL CALCIUM +D 600-40-500 MG-MG-UNIT Tb24 Generic drug: Calcium -Magnesium -Vitamin D  Take 1 tablet by mouth every evening.   CoQ10 200 MG Caps Take 200 mg by mouth every evening.   diclofenac  sodium 1 % Gel Commonly known as: VOLTAREN  Apply 1-2 g topically 4 (four) times daily as needed (knee pain.).   docusate sodium  100 MG capsule Commonly known as: Colace Take 1 capsule (100 mg total) by mouth daily as needed.   EPINEPHrine  0.3 mg/0.3 mL Soaj injection Commonly known as: EPI-PEN Inject 0.3 mg into the muscle as needed for anaphylaxis.   esomeprazole 20 MG capsule Commonly known as: NEXIUM Take 20 mg by mouth daily in the afternoon.   fluticasone  50 MCG/ACT nasal spray Commonly known as: FLONASE  Place 1 spray into both nostrils daily as needed for allergies or rhinitis.   levothyroxine  25 MCG tablet Commonly known as: SYNTHROID  Take 1 tablet (25 mcg total) by mouth daily before breakfast.   methocarbamol  750 MG tablet Commonly known as: Robaxin -750 Take 1 tablet (750 mg total) by mouth 3 (three) times daily as needed for muscle spasms.   metoprolol  succinate 25 MG 24 hr tablet Commonly known as: Toprol  XL Take 1 tablet (25 mg total) by mouth daily.   metoprolol  tartrate 100 MG tablet Commonly known as: Lopressor  Take 1 tablet (100 mg total) by mouth once for 1 dose. Take 90-120 minutes prior to scan. Hold for SBP less than 110.   multivitamin with minerals Tabs tablet Take 1 tablet by mouth every evening. Women 50+   ondansetron  4 MG tablet Commonly known as: Zofran  Take 1 tablet (4 mg total) by mouth every 8 (eight) hours as needed for nausea or vomiting.    oxyCODONE -acetaminophen  5-325 MG tablet Commonly known as: Percocet Take 1-2 tablets by mouth every 6 (six) hours as needed. To be taken after surgery   phenazopyridine  200 MG tablet Commonly known as: PYRIDIUM  Take 1 tablet (200 mg total) by mouth 3 (three) times daily.   PROBIOTIC FORMULA PO Take 1 capsule by mouth every evening.   rivaroxaban  20 MG Tabs tablet  Commonly known as: XARELTO  Take 1 tablet (20 mg total) by mouth daily with supper. What changed:  when to take this Another medication with the same name was removed. Continue taking this medication, and follow the directions you see here.   rosuvastatin  20 MG tablet Commonly known as: Crestor  Take 1 tablet (20 mg total) by mouth daily.   SUPER B COMPLEX PO Take 2 capsules by mouth every evening.   valACYclovir  1000 MG tablet Commonly known as: VALTREX  Take 1 tablet (1,000 mg total) by mouth daily as needed.   VITAMIN B-12 SL Take 1 tablet by mouth every evening.   Vitamin D3 125 MCG (5000 UT) Caps Take 5,000 Units by mouth every evening.               Durable Medical Equipment  (From admission, onward)           Start     Ordered   12/28/23 1658  DME Walker rolling  Once       Question:  Patient needs a walker to treat with the following condition  Answer:  History of hip replacement   12/28/23 1658   12/28/23 1658  DME 3 n 1  Once        12/28/23 1658   12/28/23 1658  DME Bedside commode  Once       Question:  Patient needs a bedside commode to treat with the following condition  Answer:  History of hip replacement   12/28/23 1658            Diagnostic Studies: DG Pelvis Portable Result Date: 12/28/2023 CLINICAL DATA:  Postop. EXAM: PORTABLE PELVIS 1-2 VIEWS COMPARISON:  None Available. FINDINGS: Left hip arthroplasty in expected alignment. No periprosthetic lucency or fracture. Recent postsurgical change includes air and edema in the soft tissues. IMPRESSION: Left hip arthroplasty  without immediate postoperative complication. Electronically Signed   By: Chadwick Colonel M.D.   On: 12/28/2023 18:11   DG HIP UNILAT WITH PELVIS 1V LEFT Result Date: 12/28/2023 CLINICAL DATA:  Total left hip arthroplasty. Intraoperative fluoroscopy. EXAM: DG HIP (WITH OR WITHOUT PELVIS) 1V*L* COMPARISON:  Left hip radiographs 11/10/2023 FINDINGS: Images were performed intraoperatively without the presence of a radiologist. Moderate to severe left and moderate right femoroacetabular joint space narrowing. The patient is undergoing total left hip arthroplasty. No prior lucency is seen to indicate hardware failure or loosening. Total fluoroscopy images: 5 Total fluoroscopy time: 34 seconds Total dose: Radiation Exposure Index (as provided by the fluoroscopic device): 4.11 mGy air Kerma Please see intraoperative findings for further detail. IMPRESSION: Intraoperative fluoroscopy for total left hip arthroplasty. Electronically Signed   By: Bertina Broccoli M.D.   On: 12/28/2023 14:43   DG C-Arm 1-60 Min-No Report Result Date: 12/28/2023 Fluoroscopy was utilized by the requesting physician.  No radiographic interpretation.   CT CORONARY FRACTIONAL FLOW RESERVE FLUID ANALYSIS Result Date: 12/24/2023 EXAM: FFRCT ANALYSIS FINDINGS: FFRct analysis was performed on the original cardiac CT angiogram dataset. Diagrammatic representation of the FFRct analysis is provided in a separate PDF document in PACS. This dictation was created using the PDF document and an interactive 3D model of the results. 3D model is not available in the EMR/PACS. Normal FFR range is >0.80. 1. Left Main: Normal 2. LAD: 0.95 post moderate lesion non flow-limiting 3. LCX: Normal 4. Ramus: Not applicable 5. RCA: Normal IMPRESSION: 1. 0.95 FFR post moderate mid LAD lesion.  Non flow-limiting. Note: These examples are not recommendations of [employer] and  only provided as examples of what other customers are doing. Electronically Signed   By: Dorothye Gathers M.D.   On: 12/24/2023 13:19   CT CORONARY MORPH W/CTA COR W/SCORE W/CA W/CM &/OR WO/CM Result Date: 12/24/2023 CLINICAL DATA:  Chest pain EXAM: Cardiac/Coronary CTA TECHNIQUE: A non-contrast, gated CT scan was obtained with axial slices of 2.5 mm through the heart for calcium  scoring. Calcium  scoring was performed using the Agatston method. A 120 kV prospective, gated, contrast cardiac CT scan was obtained. Gantry rotation speed was 230 msec and collimation was 0.63 mm. Two sublingual nitroglycerin  tablets (0.8 mg) were given. The 3D data set was reconstructed with motion correction for the best systolic or diastolic phase. Images were analyzed on a dedicated workstation using MPR, MIP, and VRT modes. The patient received 95 cc of contrast. FINDINGS: Image quality: Excellent. Noise artifact is: Limited. Coronary Arteries:  Normal coronary origin.  Right dominance. Left main: The left main is a large caliber vessel with a normal take off from the left coronary cusp that bifurcates to form a left anterior descending artery and a left circumflex artery. There is no plaque or stenosis. Left anterior descending artery: The LAD is patent with moderate mid vessel stenosis at the bifurcation of second diagonal, 50-69%. FFR is 0.95 distal to lesion The LAD gives off 2 patent diagonal branches, with D2 small caliber, 50-69% proximal stenosis. FFR is non flow limiting, 0.95. Left circumflex artery: The LCX is non-dominant and patent with no evidence of plaque or stenosis. The LCX gives off 2 patent obtuse marginal branches. Right coronary artery: The RCA is dominant with normal take off from the right coronary cusp. There is no evidence of plaque or stenosis. The RCA terminates as a PDA and right posterolateral branch without evidence of plaque or stenosis. Right Atrium: Right atrial size is within normal limits. Right Ventricle: The right ventricular cavity is within normal limits. Left Atrium: Left atrial size is  normal in size with no left atrial appendage filling defect. Left Ventricle: The ventricular cavity size is within normal limits. Pulmonary arteries: Normal in size. Pulmonary veins: Normal pulmonary venous drainage. Pericardium: Normal thickness without significant effusion or calcium  present. Cardiac valves: The aortic valve is trileaflet without significant calcification. The mitral valve is normal without significant calcification. Aorta: Normal caliber, 32 mm, without significant disease. Pacemaker leads in right atrium and right ventricle. Extra-cardiac findings: See attached radiology report for non-cardiac structures. IMPRESSION: 1. Coronary calcium  score of 21. 2. Total plaque volume 24mm3 which is 14 percentile for age- and sex-matched controls (calcified plaque 69mm3; non-calcified plaque 65mm3). TPV is (mild). 3. Normal coronary origin with right dominance. 4. Moderate mid LAD stenosis at the bifurcation of second diagonal branch 50-69%-FFR 0.95 distal indicative of non flow-limiting disease. RECOMMENDATIONS: CAD-RADS 3: Moderate stenosis. Consider symptom-guided anti-ischemic pharmacotherapy as well as risk factor modification per guideline directed care. Electronically Signed   By: Dorothye Gathers M.D.   On: 12/24/2023 13:18   CUP PACEART REMOTE DEVICE CHECK Result Date: 12/23/2023 PPM Scheduled remote reviewed. Normal device function.  Presenting rhythm:  AS/VP Next remote 91 days. LA, CVRS  CT Head Wo Contrast Result Date: 12/18/2023 CLINICAL DATA:  Neurologic deficit. EXAM: CT HEAD WITHOUT CONTRAST TECHNIQUE: Contiguous axial images were obtained from the base of the skull through the vertex without intravenous contrast. RADIATION DOSE REDUCTION: This exam was performed according to the departmental dose-optimization program which includes automated exposure control, adjustment of the mA and/or kV according to  patient size and/or use of iterative reconstruction technique. COMPARISON:  Head CT  dated 06/08/2023. FINDINGS: Brain: The ventricles and sulci are appropriate size for the patient's age. The gray-white matter discrimination is preserved. There is no acute intracranial hemorrhage. No mass effect or midline shift. No extra-axial fluid collection. Vascular: No hyperdense vessel or unexpected calcification. Skull: Normal. Negative for fracture or focal lesion. Sinuses/Orbits: No acute finding. Other: None IMPRESSION: No acute intracranial pathology. Electronically Signed   By: Angus Bark M.D.   On: 12/18/2023 10:59   CUP PACEART INCLINIC DEVICE CHECK Result Date: 12/03/2023 Normal in-clinic dual chamber pacemaker check. Presenting Rhythm: ASVP. Routine testing of thresholds, sensing, and impedance demonstrate stable parameters and no programming changes needed at this time. Episodes of AF on 4/8 in a cluster totaling 8.6 hours of AF.  OAC started at visit.  Estimated longevity 11 years . Pt enrolled in remote follow-up. bo  DG Chest 2 View Result Date: 12/02/2023 CLINICAL DATA:  Weakness and dizziness. EXAM: CHEST - 2 VIEW COMPARISON:  Chest radiograph dated 11/11/2023. FINDINGS: No focal consolidation, pleural effusion, pneumothorax. The cardiac silhouette is within normal limits. Left pectoral pacemaker device. No acute osseous pathology. IMPRESSION: No active cardiopulmonary disease. Electronically Signed   By: Angus Bark M.D.   On: 12/02/2023 11:31    Disposition: Discharge disposition: 01-Home or Self Care          Follow-up Information     Sandie Cross, PA-C. Schedule an appointment as soon as possible for a visit in 2 week(s).   Specialty: Orthopedic Surgery Contact information: [address] [place]  [place] [address] [phone]                  Signed: Sandie Cross 12/29/2023, 8:24 AM

## 2023-12-29 NOTE — Telephone Encounter (Signed)
 Received call from patient stating that [employer] has rejected her claim due to the dates. I explained that we always allow 12 weeks for a total joint replacement. She asked that it be complete with 6 weeks. She was out 5/2,5/3. A new [employer] form completed for cont. Leave 5/2 through 6/16. And added patient absent on 4/26. Patient was satisfied. Form faxed [phone].

## 2023-12-30 DIAGNOSIS — M1612 Unilateral primary osteoarthritis, left hip: Secondary | ICD-10-CM | POA: Diagnosis not present

## 2023-12-30 DIAGNOSIS — R2681 Unsteadiness on feet: Secondary | ICD-10-CM | POA: Diagnosis not present

## 2023-12-30 DIAGNOSIS — R2689 Other abnormalities of gait and mobility: Secondary | ICD-10-CM | POA: Diagnosis not present

## 2023-12-30 DIAGNOSIS — Z96643 Presence of artificial hip joint, bilateral: Secondary | ICD-10-CM | POA: Diagnosis not present

## 2023-12-31 NOTE — Telephone Encounter (Signed)
 Cassie,   Apologies, it was Dr. Jacquelynn Matter and Dr. Amanda Jungling as consults in hospital.  Does that count?  If not, and she needs a referral, that is just fine!  Thank you so much!  Kari [name]

## 2024-01-07 ENCOUNTER — Encounter: Admitting: Obstetrics and Gynecology

## 2024-01-08 ENCOUNTER — Telehealth: Payer: Self-pay

## 2024-01-08 NOTE — Telephone Encounter (Signed)
 Patient called and would like a RF for Oxycodone . S/P L THA-12/28/2023.  Uses [facility] [address].   CB [phone]

## 2024-01-10 ENCOUNTER — Other Ambulatory Visit: Payer: Self-pay | Admitting: Physician Assistant

## 2024-01-10 MED ORDER — OXYCODONE-ACETAMINOPHEN 5-325 MG PO TABS: 1.0000 | ORAL_TABLET | Freq: Three times a day (TID) | ORAL | 0 refills | Status: DC | PRN

## 2024-01-10 NOTE — Telephone Encounter (Signed)
 sent

## 2024-01-11 NOTE — Telephone Encounter (Signed)
Called patient. She is aware.   

## 2024-01-12 ENCOUNTER — Encounter: Payer: Self-pay | Admitting: Physician Assistant

## 2024-01-12 ENCOUNTER — Ambulatory Visit (INDEPENDENT_AMBULATORY_CARE_PROVIDER_SITE_OTHER): Admitting: Physician Assistant

## 2024-01-12 DIAGNOSIS — Z96642 Presence of left artificial hip joint: Secondary | ICD-10-CM

## 2024-01-12 NOTE — Progress Notes (Signed)
 Post-Op Visit Note   Patient: Kari  LUBERTA [name]           Date of Birth: Dec 03, 1950           MRN: [id number] Visit Date: 01/12/2024 PCP: Jobe Mulder, DO   Assessment & Plan:  Chief Complaint:  Chief Complaint  Patient presents with   Left Hip - Follow-up    Let total hip arthroplasty 12/28/2023   Visit Diagnoses:  1. Status post total replacement of left hip     Plan: Patient is a 73 year old female who comes in today 2 weeks status post left total hip replacement 12/28/2023.  She has been doing relatively well but has been in some pain as she has been overdoing her activities.  She has been taking Robaxin  and oxycodone .  She is on chronic Xarelto .  She is here today ambulating unassisted.  Examination of the left hip reveals a fully healed surgical scar with nylon sutures in place.  No evidence of infection or cellulitis.  Calves are soft nontender.  She is neurovascularly intact distally.  Today, sutures were removed and Steri-Strips applied.  She will back off aggravating activities for the next few weeks.  Follow-up in 4 weeks for repeat evaluation and AP pelvis x-rays.  Call with concerns or questions.  Follow-Up Instructions: Return in about 4 weeks (around 02/09/2024).   Orders:  No orders of the defined types were placed in this encounter.  No orders of the defined types were placed in this encounter.   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   Painful total knee replacement, right (HCC) 04/12/2019   Near syncope  03/11/2013   Bradycardia 02/21/2013   Pacemaker 02/20/2013   Chest tightness 02/19/2013   Palpitations 02/19/2013   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   Diverticulitis 03/03/2012   Chronic low back pain 08/28/2011   GERD (gastroesophageal reflux disease)    Herpes zoster conjunctivitis    Menopause    Arthritis    Diverticulosis    Allergy    Past Medical History:  Diagnosis Date   Arthritis    Chronic back pain    Diverticulitis    DVT (deep venous thrombosis) (HCC) age 62   s/p  knee arthroscopy    GERD (gastroesophageal reflux disease)    Herpes zoster conjunctivitis    Hypothyroidism    Menopause    Mobitz type 2 second degree AV block    2:1/notes 02/16/2013   Pneumonia 2014   PONV (postoperative nausea and vomiting)    Presence of permanent cardiac pacemaker    Seasonal allergies    "spring & fall; not q year" (02/17/2013)   Shoulder impingement    and left rotator cuff tear    Family History  Problem Relation Age of Onset   Hypertension Mother    Hyperlipidemia Mother    Macular degeneration Mother    COPD Mother    Lung cancer Father    Cancer Father        Lung   COPD Father  Diabetes Brother    Breast cancer Paternal Aunt     Past Surgical History:  Procedure Laterality Date   ACHILLES TENDON SURGERY     ARTHROGRAM KNEE Right 1975   CHOLECYSTECTOMY  1990's   colonscopy     ESOPHAGOGASTRODUODENOSCOPY     EYE SURGERY     muscle release right eye   INSERT / REPLACE / REMOVE PACEMAKER  02/17/2013   [employer] Advantio dual-chamber pacemaker, model [id number],   KNEE ARTHROSCOPY Right 1982, 1983, 1995, 1996   KNEE ARTHROSCOPY W/ ACL RECONSTRUCTION Left 1984   no reconstruction needed   LAPAROSCOPIC SIGMOID COLECTOMY N/A 01/01/2015   Procedure: LAPAROSCOPIC SIGMOID COLECTOMY;  Surgeon: Lockie Rima, MD;  Location: [facility] OR;  Service: General;  Laterality: N/A;   LEAD EXTRACTION N/A 03/24/2022   Procedure: LEAD EXTRACTION;  Surgeon:  Tammie Fall, MD;  Location: [facility] INVASIVE CV LAB;  Service: Cardiovascular;  Laterality: N/A;   PACEMAKER IMPLANT N/A 03/24/2022   Procedure: PACEMAKER IMPLANT;  Surgeon: Tammie Fall, MD;  Location: [facility] INVAS[facility] CV [facility];  Service: Cardiovascular;  Laterality: N/A;   PERMANENT PACEMAKER INSERTION N/A 02/17/2013   Procedure: PERMANENT PACEMAKER INSERTION;  Surgeon: Tammie Fall, MD;  Location[facility] C[facility];  Service: Cardiovascular;  Laterality: N/A;   SHOULDER ARTHROSCOPY Left 09/05/2016   Procedure: LEFT SHOULDER ARTHROSCOPY , ACROMIOPLASTY AND ROTATOR CUFF REPAIR;  Surgeon: Dayne Even, MD;  Location: [facility] OR;  Service: Orthopedics;  Laterality: Left;   TONSILLECTOMY  1961   TOTAL HIP ARTHROPLASTY Left 12/28/2023   Procedure: ARTHROPLASTY, HIP, TOTAL, ANTERIOR APPROACH;  Surgeon: Wes Hamman, MD;  Location: [facility] OR;  Service: Orthopedics;  Laterality: Left;  3-C   TOTAL KNEE ARTHROPLASTY Right 2000; 06/2000   "replaced w/appropriate hardware" (02/17/2013)   TOTAL KNEE REVISION Right 04/13/2019   Procedure: Revision Right Knee Arthroplasty;  Surgeon: Wendolyn Hamburger, MD;  Location: [facility] ORS;  Service: Orthopedics;  Laterality: Right;   TRIGGER FINGER RELEASE Right ~ 2012   "thumb" (02/17/2013)   VAGINAL HYSTERECTOMY  ~ 1994   partial    Social History   Occupational History   Occupation: Teacher, 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[employer]    Comment: Administrator, [employer] Service  Tobacco Use   Smoking status: Never   Smokeless tobacco: Never  Vaping Use   Vaping status: Never Used  Substance and Sexual Activity   Alcohol use: Not Currently    Comment: occasional wine    Drug use: No   Sexual activity: Yes    Birth control/protection: Surgical    Comment: 02/17/2013 "I have a same sex partner"

## 2024-01-13 ENCOUNTER — Ambulatory Visit: Admitting: Internal Medicine

## 2024-01-14 NOTE — Progress Notes (Signed)
 Electrophysiology Office Note:   Date:  01/16/2024  ID:  Kari  [name], Kari [name], Kari [name]  Primary Cardiologist: Kari Sells, MD Electrophysiologist: Kari Sells, MD      History of Present Illness:   Kari  L [name] is a 73 y.o. 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  Discussed the use of AI scribe software for clinical note transcription with the patient, who gave verbal consent to proceed.  History of Present Illness Kari  L [name] "Kari [name]" is a 73 year old female with a pacemaker who presents with concerns about atrial fibrillation and anticoagulation management. She has been experiencing atrial fibrillation since August of last year, as detected by her pacemaker device. Episodes last up to eight hours, which she was unaware of at the time. 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 She is currently taking Xarelto  for the atrial fibrillation episodes. She is concerned about the risks associated with anticoagulation, particularly the fear of head injury due to her active lifestyle. Working in the emergency department and being active at home makes her apprehensive about the potential for bleeding. She has a history of heart block, which led to her pacemaker dependency. She recalls an incident while golfing in [name]  when she felt unusually fatigued and discovered her pulse was thirty, prompting her to seek medical attention. She has always had a low resting heart rate and has been active throughout her life. No high blood pressure, diabetes, or sleep apnea. She does not consume alcohol frequently and does not have a history of caffeine affecting her heart rate.  Review of systems complete and found to be negative unless listed in HPI.   EP Information / Studies  Reviewed:    EKG is not ordered today. EKG from 12/21/23 reviewed which showed AS / VP rhythm.       CTA Coronary 12/24/23:  1. Left Main: Normal 2. LAD: 0.95 post moderate lesion non flow-limiting 3. LCX: Normal 4. Ramus: Not applicable 5. RCA: Normal   IMPRESSION: 1. 0.95 FFR post moderate mid LAD lesion.  Non flow-limiting.  Risk Assessment/Calculations:    CHA2DS2-VASc Score = 3   This indicates a 3.2% annual risk of stroke. The patient's score is based upon: CHF History: 0 HTN History: 1 Diabetes History: 0 Stroke History: 0 Vascular Disease History: 0 Age Score: 1 Gender Score: 1             Physical Exam:   VS:  BP 122/76   Pulse 62   Ht 5\' 8"  (1.727 m)   Wt 160 lb (72.6 kg)   SpO2 98%   BMI 24.33 kg/m    Wt Readings from Last 3 Encounters:  01/15/24 160 lb (72.6 kg)  12/28/23 160 lb (72.6 kg)  12/22/23 173 lb (78.5 kg)     GEN: Well nourished, well developed in no acute distress NECK: No JVD CARDIAC: Normal rate, regular rhythm RESPIRATORY:  Clear to auscultation without rales, wheezing or rhonchi  ABDOMEN: Soft, non-distended EXTREMITIES:  No edema; No deformity   ASSESSMENT AND PLAN:    #. Paroxysmal atrial fibrillation: Device detected. First detected in 2024. Patient is very concerned about progression to persistent AF and long term risks of stroke, dementia, CHF.  #. Secondary hypercoagulable state due to atrial fibrillation: CHA2DS2-VASc 3. She would  like to come off [facility] due to lifestyle, very active. Still working in ED.   - We discussed options for continued monitoring vs catheter ablation for early intervention and patient favors catheter ablation. Risk, benefits, and alternatives to EP study and ablation for afib were discussed. These risks include but are not limited to stroke, bleeding, vascular damage, tamponade, perforation, damage to the esophagus, lungs, phrenic nerve and other structures, pulmonary vein stenosis, worsening renal  function, coronary vasospasm and death.  Discussed potential need for repeat ablation procedures and antiarrhythmic drugs after an initial ablation. The patient understands these risk and wishes to proceed.  We will therefore proceed with catheter ablation at the next available time.  Carto, ICE, anesthesia are requested for the procedure.  Will also obtain CT PV protocol prior to the procedure to exclude LAA thrombus and further evaluate atrial anatomy. - Continue Xarelto  20mg  in anticipation of ablation. She does not want to remain on long-term anticoagulation nor antiarrhythmics if possible. We will tentatively plan to stop 3 months after ablation and monitor for recurrence with her device.   #. CHB s/p [employer] PPM:  - In clinic device interrogation was performed with appropriate device function and stable lead parameters.  - Continue remote monitoring.   #. Hypertension -At goal today.  Recommend checking blood pressures 1-2 times per week at home and recording the values.  Recommend bringing these recordings to the primary care physician.  Follow up with Dr. Daneil [name] 3 months after ablation.   Signed, Ardeen Kohler, MD

## 2024-01-15 ENCOUNTER — Ambulatory Visit: Attending: Cardiology | Admitting: Cardiology

## 2024-01-15 ENCOUNTER — Other Ambulatory Visit: Payer: Self-pay

## 2024-01-15 ENCOUNTER — Encounter: Payer: Self-pay | Admitting: Cardiology

## 2024-01-15 VITALS — BP 122/76 | HR 62 | Ht 68.0 in | Wt 160.0 lb

## 2024-01-15 DIAGNOSIS — I480 Paroxysmal atrial fibrillation: Secondary | ICD-10-CM

## 2024-01-15 DIAGNOSIS — I442 Atrioventricular block, complete: Secondary | ICD-10-CM

## 2024-01-15 DIAGNOSIS — Z950 Presence of cardiac pacemaker: Secondary | ICD-10-CM | POA: Diagnosis not present

## 2024-01-15 DIAGNOSIS — I4891 Unspecified atrial fibrillation: Secondary | ICD-10-CM

## 2024-01-15 DIAGNOSIS — D6869 Other thrombophilia: Secondary | ICD-10-CM | POA: Diagnosis not present

## 2024-01-15 LAB — CUP PACEART INCLINIC DEVICE CHECK
Date Time Interrogation Session: 20250523093951
Implantable Lead Connection Status: 753985
Implantable Lead Connection Status: 753985
Implantable Lead Implant Date: 20230731
Implantable Lead Implant Date: 20230731
Implantable Lead Location: 753859
Implantable Lead Location: 753860
Implantable Lead Model: 7841
Implantable Lead Model: 7842
Implantable Lead Serial Number: 1215821
Implantable Lead Serial Number: 1304218
Implantable Pulse Generator Implant Date: 20230731
Pulse Gen Serial Number: 600527

## 2024-01-15 NOTE — Patient Instructions (Addendum)
 Medication Instructions:  Your physician recommends that you continue on your current medications as directed. Please refer to the Current Medication list given to you today.  *If you need a refill on your cardiac medications before your next appointment, please call your pharmacy*  Lab Work: BMET and CBC - you may go to any [facility] location to have these drawn within 30 days of your procedure  Testing/Procedures: Ablation Your physician has recommended that you have an ablation. Catheter ablation is a medical procedure used to treat some cardiac arrhythmias (irregular heartbeats). During catheter ablation, a long, thin, flexible tube is put into a blood vessel in your groin (upper thigh), or neck. This tube is called an ablation catheter. It is then guided to your heart through the blood vessel. Radio frequency waves destroy small areas of heart tissue where abnormal heartbeats may cause an arrhythmia to start. Please see the instruction sheet given to you today.  Follow-Up: At [facility], you and your health needs are our priority.  As part of our continuing mission to provide you with exceptional heart care, we have created designated Provider Care Teams.  These Care Teams include your primary Cardiologist (physician) and Advanced Practice Providers (APPs -  Physician Assistants and Nurse Practitioners) who all work together to provide you with the care you need, when you need it.   Your next appointment:   We will contact you about your post-procedure follow up appointments.

## 2024-01-15 NOTE — Telephone Encounter (Signed)
 Dr Theodis Fiscal reviewed and will see patient for HTN and CAD however feels she needs to keep EP for Afib, pacer, and arrhythmia issues   Patient seen by EP today

## 2024-01-17 ENCOUNTER — Ambulatory Visit: Payer: Self-pay | Admitting: Cardiology

## 2024-01-28 ENCOUNTER — Other Ambulatory Visit: Payer: Self-pay | Admitting: Family Medicine

## 2024-01-28 ENCOUNTER — Other Ambulatory Visit (INDEPENDENT_AMBULATORY_CARE_PROVIDER_SITE_OTHER)

## 2024-01-28 DIAGNOSIS — I10 Essential (primary) hypertension: Secondary | ICD-10-CM

## 2024-01-28 DIAGNOSIS — E7800 Pure hypercholesterolemia, unspecified: Secondary | ICD-10-CM | POA: Diagnosis not present

## 2024-01-29 ENCOUNTER — Encounter: Payer: Self-pay | Admitting: Family Medicine

## 2024-01-29 ENCOUNTER — Other Ambulatory Visit: Payer: Self-pay | Admitting: Family Medicine

## 2024-01-29 ENCOUNTER — Other Ambulatory Visit (HOSPITAL_COMMUNITY): Payer: Self-pay

## 2024-01-29 ENCOUNTER — Ambulatory Visit: Payer: Self-pay | Admitting: Family Medicine

## 2024-01-29 ENCOUNTER — Other Ambulatory Visit (HOSPITAL_BASED_OUTPATIENT_CLINIC_OR_DEPARTMENT_OTHER): Payer: Self-pay

## 2024-01-29 LAB — CBC WITH DIFFERENTIAL/PLATELET
Basophils Absolute: 0 10*3/uL (ref 0.0–0.1)
Basophils Relative: 0.9 % (ref 0.0–3.0)
Eosinophils Absolute: 0.1 10*3/uL (ref 0.0–0.7)
Eosinophils Relative: 2.6 % (ref 0.0–5.0)
HCT: 37.2 % (ref 36.0–46.0)
Hemoglobin: 12.3 g/dL (ref 12.0–15.0)
Lymphocytes Relative: 31.8 % (ref 12.0–46.0)
Lymphs Abs: 1.6 10*3/uL (ref 0.7–4.0)
MCHC: 33.1 g/dL (ref 30.0–36.0)
MCV: 92 fl (ref 78.0–100.0)
Monocytes Absolute: 0.4 10*3/uL (ref 0.1–1.0)
Monocytes Relative: 7.5 % (ref 3.0–12.0)
Neutro Abs: 3 10*3/uL (ref 1.4–7.7)
Neutrophils Relative %: 57.2 % (ref 43.0–77.0)
Platelets: 277 10*3/uL (ref 150.0–400.0)
RBC: 4.04 Mil/uL (ref 3.87–5.11)
RDW: 14.3 % (ref 11.5–15.5)
WBC: 5.2 10*3/uL (ref 4.0–10.5)

## 2024-01-29 LAB — COMPREHENSIVE METABOLIC PANEL WITH GFR
ALT: 10 U/L (ref 0–35)
AST: 16 U/L (ref 0–37)
Albumin: 4.3 g/dL (ref 3.5–5.2)
Alkaline Phosphatase: 78 U/L (ref 39–117)
BUN: 10 mg/dL (ref 6–23)
CO2: 31 meq/L (ref 19–32)
Calcium: 9.8 mg/dL (ref 8.4–10.5)
Chloride: 101 meq/L (ref 96–112)
Creatinine, Ser: 0.71 mg/dL (ref 0.40–1.20)
GFR: 85.6 mL/min (ref >60.00)
Glucose, Bld: 96 mg/dL (ref 70–99)
Potassium: 4.6 meq/L (ref 3.5–5.1)
Sodium: 139 meq/L (ref 135–145)
Total Bilirubin: 0.5 mg/dL (ref 0.2–1.2)
Total Protein: 7.2 g/dL (ref 6.0–8.3)

## 2024-01-29 LAB — LIPID PANEL
Cholesterol: 127 mg/dL (ref 0–200)
HDL: 50.1 mg/dL (ref >39.00)
LDL Cholesterol: 61 mg/dL (ref 0–99)
NonHDL: 77.35
Total CHOL/HDL Ratio: 3
Triglycerides: 84 mg/dL (ref 0.0–149.0)
VLDL: 16.8 mg/dL (ref 0.0–40.0)

## 2024-01-29 LAB — HEPATIC FUNCTION PANEL
ALT: 10 U/L (ref 0–35)
AST: 16 U/L (ref 0–37)
Albumin: 4.3 g/dL (ref 3.5–5.2)
Alkaline Phosphatase: 78 U/L (ref 39–117)
Bilirubin, Direct: 0.1 mg/dL (ref 0.0–0.3)
Total Bilirubin: 0.5 mg/dL (ref 0.2–1.2)
Total Protein: 7.2 g/dL (ref 6.0–8.3)

## 2024-01-29 MED ORDER — ROSUVASTATIN CALCIUM 10 MG PO TABS
10.0000 mg | ORAL_TABLET | Freq: Every day | ORAL | 3 refills | Status: AC
  Filled 2024-01-29 (×2): qty 90, 90d supply, fill #0
  Filled 2024-05-19 – 2024-06-02 (×2): qty 90, 90d supply, fill #1
  Filled 2024-08-26: qty 90, 90d supply, fill #2
  Filled 2024-09-06: qty 90, 90d supply, fill #0

## 2024-01-30 ENCOUNTER — Telehealth: Admitting: Physician Assistant

## 2024-01-30 DIAGNOSIS — R3989 Other symptoms and signs involving the genitourinary system: Secondary | ICD-10-CM

## 2024-01-30 NOTE — Progress Notes (Signed)
  Because of increased risk for complicated UTI/antibiotic resistant UTI in those over 65, the standard of care is for a urinalysis and urine culture to be obtained prior to initiation of antibiotics. As such, I feel your condition warrants further evaluation and I recommend that you be seen in a face-to-face visit.   NOTE: There will be NO CHARGE for this E-Visit   If you are having a true medical emergency, please call 911.     For an urgent face to face visit, [facility] has multiple urgent care centers for your convenience.  Click the link below for the full list of locations and hours, walk-in wait times, appointment scheduling options and driving directions:  Urgent Care - [place], [place], [place], [place], [place], [place]  [place]     Your MyChart E-visit questionnaire answers were reviewed by a board certified advanced clinical practitioner to complete your personal care plan based on your specific symptoms.    Thank you for using e-Visits.

## 2024-02-04 ENCOUNTER — Telehealth: Payer: Self-pay

## 2024-02-04 NOTE — Telephone Encounter (Signed)
 Spoke with patient to complete pre-procedure call.     New medical conditions?  NO Recent hospitalizations or surgeries? NO Started any new medications? NO Patient made aware to contact office to inform of any new medications started. Any changes in activities of daily living? NO  Pre-procedure testing scheduled: CT not needed, per Dr. Daneil Dunker and lab work completed on 01/28/24, but patient was very upset that it was out of the 30 day window for ablation and will have to repeat lab work sometime next week. Patient is aware.  Confirmed patient is taking Xarelto  20mg  daily and will continue taking medication before procedure or it may need to be rescheduled.  Confirmed patient is scheduled for Atrial Fibrillation Ablation  on Monday, July 7 with Dr. Clinton Danas. Instructed patient to arrive at the Main Entrance A at [facility]: [address] [place], [place] [address] and check in at Admitting at 5:30 AM  Advised of plan to go home the same day and will only stay overnight if medically necessary. You MUST have a responsible adult to drive you home and MUST be with you the first 24 hours after you arrive home or your procedure could be cancelled.  Patient verbalized understanding to information provided and is agreeable to proceed with procedure.

## 2024-02-05 NOTE — Progress Notes (Signed)
 Remote pacemaker transmission.

## 2024-02-05 NOTE — Addendum Note (Signed)
 Addended by: Lott Rouleau A on: 02/05/2024 09:57 AM   Modules accepted: Orders

## 2024-02-09 ENCOUNTER — Encounter: Payer: Self-pay | Admitting: Family Medicine

## 2024-02-09 ENCOUNTER — Other Ambulatory Visit (HOSPITAL_BASED_OUTPATIENT_CLINIC_OR_DEPARTMENT_OTHER): Payer: Self-pay

## 2024-02-09 ENCOUNTER — Ambulatory Visit (INDEPENDENT_AMBULATORY_CARE_PROVIDER_SITE_OTHER)

## 2024-02-09 ENCOUNTER — Ambulatory Visit (INDEPENDENT_AMBULATORY_CARE_PROVIDER_SITE_OTHER): Admitting: Physician Assistant

## 2024-02-09 ENCOUNTER — Ambulatory Visit: Admitting: Family Medicine

## 2024-02-09 VITALS — BP 120/74 | HR 64 | Temp 97.7°F | Resp 16 | Ht 68.0 in | Wt 160.0 lb

## 2024-02-09 DIAGNOSIS — Z96642 Presence of left artificial hip joint: Secondary | ICD-10-CM

## 2024-02-09 DIAGNOSIS — H8111 Benign paroxysmal vertigo, right ear: Secondary | ICD-10-CM

## 2024-02-09 MED ORDER — ONDANSETRON 4 MG PO TBDP
4.0000 mg | ORAL_TABLET | Freq: Three times a day (TID) | ORAL | 0 refills | Status: DC | PRN
  Filled 2024-02-09: qty 20, 7d supply, fill #0

## 2024-02-09 NOTE — Telephone Encounter (Signed)
 Called pt was advised we could see her today appt scheduled.

## 2024-02-09 NOTE — Progress Notes (Signed)
 Chief Complaint  Patient presents with   Dizziness    Dizziness     Kari  L [name] is 73 y.o. pt here for dizziness.  Duration: 1 day Pass out? No Spinning? Yes- triggered by head movement Lasts for a few seconds-minutes Recent illness/fever? No Neurologic signs? No Change in PO intake? No Palpitations? No  Past Medical History:  Diagnosis Date   Arthritis    Chronic back pain    Diverticulitis    DVT (deep venous thrombosis) (HCC) age 59   s/p  knee arthroscopy    GERD (gastroesophageal reflux disease)    Herpes zoster conjunctivitis    Hypothyroidism    Menopause    Mobitz type 2 second degree AV block    2:1/notes 02/16/2013   Pneumonia 2014   PONV (postoperative nausea and vomiting)    Presence of permanent cardiac pacemaker    Seasonal allergies    spring & fall; not q year (02/17/2013)   Shoulder impingement    and left rotator cuff tear    BP 120/74 (BP Location: Left Arm, Patient Position: Sitting)   Pulse 64   Temp 97.7 F (36.5 C) (Oral)   Resp 16   Ht 5' 8 (1.727 m)   Wt 160 lb (72.6 kg)   SpO2 97%   BMI 24.33 kg/m  General: Awake, alert, appears stated age Eyes: PERRLA, EOMi Ears: Patent, TM's neg b/l Heart: RRR, no murmurs, no carotid bruits Lungs: CTAB, no accessory muscle use MSK: 5/5 strength throughout, gait normal Neuro: No cerebellar signs, patellar reflex 1/4 b/l wo clonus, calcaneal reflex 0/4 b/l wo clonus, biceps reflex 1/4 b/l wo clonus; Dix-Hall-Pike negative b/l. Psych: Age appropriate judgment and insight, normal mood and affect  BPPV (benign paroxysmal positional vertigo), right  Stay hydrated. Epley Maneuver. Seems better than this AM. Slight vertigo when rising from [facility] on L.  Send message if no significant improvement or worsening and we will consider vestibular rehab.  No concern for cerebellar stroke today. F/u prn. Pt voiced understanding and agreement to the plan.  Shellie Dials [place], DO 02/09/24 3:59  PM

## 2024-02-09 NOTE — Patient Instructions (Addendum)
 Stay hydrated.  Continue the Epley maneuver. Let me know if you have worsening issues.   Let us  know if you need anything.

## 2024-02-09 NOTE — Progress Notes (Signed)
 Post-Op Visit Note   Patient: Kari [name]           Date of Birth: Jan 18, 1951           MRN: [id number] Visit Date: 02/09/2024 PCP: Jobe Mulder, DO   Assessment & Plan:  Chief Complaint:  Chief Complaint  Patient presents with   Left Hip - Follow-up    Left total hip arthroplasty 12/28/2023   Visit Diagnoses:  1. Status post total replacement of left hip     Plan: Patient is a pleasant 72 year old female who comes in today 6 weeks status post left total hip replacement 12/28/2023. She has been doing well.  She still notes occasional pain to the left thigh in addition to paresthesias to the left lateral thigh.  Overall, her symptoms have improved.  She is no longer taking pain medication.  She is on Eliquis  for which she was taking prior to surgery.  Examination of her left hip reveals painless hip flexion.  Pain with logroll only with the extremes of rotation.  She is neurovascularly intact distally.  At this point, she will advance with activity as tolerated.  Work note provided to return to work this coming Monday.  She will follow-up in 6 weeks for repeat evaluation.  Call with concerns or questions. Follow-Up Instructions: Return in about 6 weeks (around 03/22/2024).   Orders:  Orders Placed This Encounter  Procedures   XR Pelvis 1-2 Views   No orders of the defined types were placed in this encounter.   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)  04/13/2019   Painful total knee replacement, right (HCC) 04/12/2019   Near syncope 03/11/2013   Bradycardia 02/21/2013   Pacemaker 02/20/2013   Chest tightness 02/19/2013   Palpitations 02/19/2013   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   Diverticulitis 03/03/2012   Chronic low back pain 08/28/2011   GERD (gastroesophageal reflux disease)    Herpes zoster conjunctivitis    Menopause    Arthritis    Diverticulosis    Allergy    Past Medical History:  Diagnosis Date   Arthritis    Chronic back pain    Diverticulitis    DVT (deep venous thrombosis) (HCC) age 19   s/p  knee arthroscopy    GERD (gastroesophageal reflux disease)    Herpes zoster conjunctivitis    Hypothyroidism    Menopause    Mobitz type 2 second degree AV block    2:1/notes 02/16/2013   Pneumonia 2014   PONV (postoperative nausea and vomiting)    Presence of permanent cardiac pacemaker    Seasonal allergies    spring & fall; not q year (02/17/2013)   Shoulder impingement    and left rotator cuff tear    Family History  Problem Relation Age of Onset   Hypertension Mother    Hyperlipidemia Mother    Macular degeneration Mother    COPD Mother    Lung cancer Father    Cancer Father  Lung   COPD Father    Diabetes Brother    Breast cancer Paternal Aunt     Past Surgical History:  Procedure Laterality Date   ACHILLES TENDON SURGERY     ARTHROGRAM KNEE Right 1975   CHOLECYSTECTOMY  1990's   colonscopy     ESOPHAGOGASTRODUODENOSCOPY     EYE SURGERY     muscle release right eye   INSERT / REPLACE / REMOVE PACEMAKER  02/17/2013   [employer] Advantio dual-chamber pacemaker, model [id number],   KNEE ARTHROSCOPY Right 1982, 1983, 1995, 1996   KNEE ARTHROSCOPY W/ ACL RECONSTRUCTION Left 1984   no reconstruction needed   LAPAROSCOPIC SIGMOID COLECTOMY N/A 01/01/2015   Procedure: LAPAROSCOPIC SIGMOID COLECTOMY;  Surgeon: Lockie Rima, MD;  Location: [facility] OR;  Service: General;   Laterality: N/A;   LEAD EXTRACTION N/A 03/24/2022   Procedure: LEAD EXTRACTION;  Surgeon: Tammie Fall, MD;  Location: [facility] INVASIVE CV LAB;  Service: Cardiovascular;  Laterality: N/A;   PACEMAKER IMPLANT N/A 03/24/2022   Procedure: PACEMAKER IMPLANT;  Surgeon: Tammie Fall, MD;  Location: [facility] INVASIVE CV LAB;  Service: Cardiovascular;  Laterality: N/A;   PERMANENT PACEMAKER INSERTION N/A 02/17/2013   Procedure: PERMANENT PACEMAKER INSERTION;  Surgeon: Tammie Fall, MD;  Location: [facility] CATH LAB;  Service: Cardiovascular;  Laterality: N/A;   SHOULDER ARTHROSCOPY Left 09/05/2016   Procedure: LEFT SHOULDER ARTHROSCOPY , ACROMIOPLASTY AND ROTATOR CUFF REPAIR;  Surgeon: Dayne Even, MD;  Location: [facility] OR;  Service: Orthopedics;  Laterality: Left;   TONSILLECTOMY  1961   TOTAL HIP ARTHROPLASTY Left 12/28/2023   Procedure: ARTHROPLASTY, HIP, TOTAL, ANTERIOR APPROACH;  Surgeon: Wes Hamman, MD;  Location: [facility] OR;  Service: Orthopedics;  Laterality: Left;  3-C   TOTAL KNEE ARTHROPLASTY Right 2000; 06/2000   replaced w/appropriate hardware (02/17/2013)   TOTAL KNEE REVISION Right 04/13/2019   Procedure: Revision Right Knee Arthroplasty;  Surgeon: Wendolyn Hamburger, MD;  Location: [facility] ORS;  Service: Orthopedics;  Laterality: Right;   TRIGGER FINGER RELEASE Right ~ 2012   thumb (02/17/2013)   VAGINAL HYSTERECTOMY  ~ 1994   partial    Social History   Occupational History   Occupation: Teacher, 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[employer]    Comment: Administrator, Civil Service  Tobacco Use   Smoking status: Never   Smokeless tobacco: Never  Vaping Use   Vaping status: Never Used  Substance and Sexual Activity   Alcohol use: Not Currently    Comment: occasional wine    Drug use: No   Sexual activity: Yes    Birth control/protection: Surgical    Comment: 02/17/2013 I have a same sex partner

## 2024-02-10 ENCOUNTER — Encounter: Payer: Self-pay | Admitting: Internal Medicine

## 2024-02-10 ENCOUNTER — Other Ambulatory Visit (HOSPITAL_COMMUNITY): Payer: Self-pay

## 2024-02-10 ENCOUNTER — Other Ambulatory Visit: Payer: Self-pay | Admitting: Internal Medicine

## 2024-02-10 ENCOUNTER — Telehealth: Payer: Self-pay | Admitting: Cardiology

## 2024-02-10 DIAGNOSIS — I4891 Unspecified atrial fibrillation: Secondary | ICD-10-CM

## 2024-02-10 MED ORDER — RIVAROXABAN 20 MG PO TABS
20.0000 mg | ORAL_TABLET | Freq: Every day | ORAL | 1 refills | Status: DC
  Filled 2024-02-10: qty 90, 90d supply, fill #0

## 2024-02-10 NOTE — Telephone Encounter (Signed)
 Patient calling in to see if we received forms, that's that need to get filled out by Dr. Daneil Dunker. She states its coming from [employer]. She states that forms is for from her being out of work. Please advise

## 2024-02-10 NOTE — Telephone Encounter (Signed)
 Prescription refill request for Xarelto  received.  Indication: Afib  Last office visit: 01/15/24 Daneil Dunker)  Weight: 72.6kg Age: 73  Scr: 0.71 (01/28/24)  CrCl: 83.44ml/min  Appropriate dose. Refill sent.

## 2024-02-11 ENCOUNTER — Other Ambulatory Visit (HOSPITAL_COMMUNITY): Payer: Self-pay

## 2024-02-11 MED ORDER — RIVAROXABAN 20 MG PO TABS
20.0000 mg | ORAL_TABLET | Freq: Every day | ORAL | 11 refills | Status: DC
Start: 2024-02-11 — End: 2024-06-02
  Filled 2024-02-11 – 2024-05-13 (×3): qty 30, 30d supply, fill #0

## 2024-02-12 ENCOUNTER — Encounter: Admitting: Internal Medicine

## 2024-02-18 ENCOUNTER — Telehealth (HOSPITAL_COMMUNITY): Payer: Self-pay

## 2024-02-18 NOTE — Telephone Encounter (Signed)
 Patient returned call to discuss upcoming procedure.   CT: completed.  Labs: ordered. Patient will try to obtain prior to July 3.  Any recent signs of acute illness or been started on antibiotics? NO Any new medications started? NO Any medications to hold? NO  Any missed doses of blood thinner? NO Advised patient to continue taking ANTICOAGULANT: Xarelto  (Rivaroxaban ) daily without missing any doses.  Medication instructions:  On the morning of your procedure DO NOT take any medication., including Xarelto  or the procedure may be rescheduled. Nothing to eat or drink after midnight prior to your procedure.  Confirmed patient is scheduled for Atrial Fibrillation Ablation on Monday, July 7 with Dr. Sidra Kitty. Instructed patient to arrive at the Main Entrance A at [facility]: [address] [place], [place] [address] and check in at Admitting at 5:30 AM.  Advised of plan to go home the same day and will only stay overnight if medically necessary. You MUST have a responsible adult to drive you home and MUST be with you the first 24 hours after you arrive home or your procedure could be cancelled.  Patient verbalized understanding to all instructions provided and agreed to proceed with procedure.

## 2024-02-18 NOTE — Telephone Encounter (Signed)
 Attempted to reach patient to discuss upcoming procedure, no answer. Left VM for patient to return call.

## 2024-02-19 ENCOUNTER — Telehealth: Payer: Self-pay | Admitting: Cardiology

## 2024-02-19 DIAGNOSIS — Z0279 Encounter for issue of other medical certificate: Secondary | ICD-10-CM

## 2024-02-19 NOTE — Telephone Encounter (Signed)
 Received a [employer]ML[employer] form on 02/15/24.  Patient came in today and signed the release of information and paid the $29 form fee.  [employer] form is in Dr. Shaune box.

## 2024-02-23 NOTE — Telephone Encounter (Signed)
 Form has been completed and signed. Placed in box for LeAnne.

## 2024-02-23 NOTE — Telephone Encounter (Signed)
 Pt is calling checking on paperwork because [employer] says they have not received it back and they have denied her claim because of it. Pt would like a call back.

## 2024-02-24 ENCOUNTER — Other Ambulatory Visit

## 2024-02-24 ENCOUNTER — Telehealth: Payer: Self-pay | Admitting: Family Medicine

## 2024-02-24 ENCOUNTER — Other Ambulatory Visit (HOSPITAL_COMMUNITY): Payer: Self-pay

## 2024-02-24 DIAGNOSIS — I442 Atrioventricular block, complete: Secondary | ICD-10-CM | POA: Diagnosis not present

## 2024-02-24 DIAGNOSIS — I4891 Unspecified atrial fibrillation: Secondary | ICD-10-CM | POA: Diagnosis not present

## 2024-02-24 DIAGNOSIS — Z950 Presence of cardiac pacemaker: Secondary | ICD-10-CM | POA: Diagnosis not present

## 2024-02-24 NOTE — Telephone Encounter (Signed)
 FMLA form faxed to [employer] and scanned into chart. Billing and patient notified.

## 2024-02-24 NOTE — Telephone Encounter (Signed)
 Good morning I need lab orders for this pt

## 2024-02-24 NOTE — Telephone Encounter (Signed)
 I don't have an obvious reason for her coming in for labs? If she needs labs from Dr. Kennyth, I am OK with that being under my name if this location is more convenient for her. Thx.

## 2024-02-24 NOTE — Telephone Encounter (Signed)
 Called pt was advised Dr.Wendling don't order the labs and she will need to reach out to  her Cardiology office to see about where to have blood work done. Pt stated understand lab appt cancel.

## 2024-02-25 ENCOUNTER — Encounter: Payer: Self-pay | Admitting: Family Medicine

## 2024-02-25 LAB — CBC
Hematocrit: 39.7 % (ref 34.0–46.6)
Hemoglobin: 12.2 g/dL (ref 11.1–15.9)
MCH: 30.3 pg (ref 26.6–33.0)
MCHC: 30.7 g/dL — ABNORMAL LOW (ref 31.5–35.7)
MCV: 99 fL — ABNORMAL HIGH (ref 79–97)
Platelets: 252 10*3/uL (ref 150–450)
RBC: 4.02 x10E6/uL (ref 3.77–5.28)
RDW: 12.7 % (ref 11.7–15.4)
WBC: 5.4 10*3/uL (ref 3.4–10.8)

## 2024-02-25 LAB — BASIC METABOLIC PANEL WITH GFR
BUN/Creatinine Ratio: 16 (ref 12–28)
BUN: 12 mg/dL (ref 8–27)
CO2: 21 mmol/L (ref 20–29)
Calcium: 9.6 mg/dL (ref 8.7–10.3)
Chloride: 102 mmol/L (ref 96–106)
Creatinine, Ser: 0.74 mg/dL (ref 0.57–1.00)
Glucose: 96 mg/dL (ref 70–99)
Potassium: 4.9 mmol/L (ref 3.5–5.2)
Sodium: 141 mmol/L (ref 134–144)
eGFR: 86 mL/min/{1.73_m2} (ref >59)

## 2024-02-28 NOTE — Pre-Procedure Instructions (Signed)
 Instructed patient on the following items: Arrival time 0515 Nothing to eat or drink after midnight No meds AM of procedure Responsible person to drive you home and stay with you for 24 hrs  Have you missed any doses of anti-coagulant Xarelto - takes once a day, hasn't missed any doses in last 4 weeks.

## 2024-02-29 ENCOUNTER — Ambulatory Visit (HOSPITAL_COMMUNITY): Admitting: Anesthesiology

## 2024-02-29 ENCOUNTER — Encounter (HOSPITAL_COMMUNITY)
Admission: RE | Disposition: A | Discharge status: Home / Self Care | Payer: Self-pay | Source: Home / Self Care | Attending: Cardiology

## 2024-02-29 ENCOUNTER — Encounter (HOSPITAL_COMMUNITY): Payer: Self-pay | Admitting: Cardiology

## 2024-02-29 ENCOUNTER — Ambulatory Visit (HOSPITAL_COMMUNITY)
Admission: RE | Admit: 2024-02-29 | Discharge: 2024-03-01 | Disposition: A | Discharge status: Home / Self Care | Attending: Cardiology | Admitting: Cardiology

## 2024-02-29 ENCOUNTER — Other Ambulatory Visit: Payer: Self-pay

## 2024-02-29 DIAGNOSIS — K219 Gastro-esophageal reflux disease without esophagitis: Secondary | ICD-10-CM | POA: Diagnosis not present

## 2024-02-29 DIAGNOSIS — I442 Atrioventricular block, complete: Secondary | ICD-10-CM | POA: Diagnosis not present

## 2024-02-29 DIAGNOSIS — E039 Hypothyroidism, unspecified: Secondary | ICD-10-CM | POA: Diagnosis not present

## 2024-02-29 DIAGNOSIS — Z79899 Other long term (current) drug therapy: Secondary | ICD-10-CM | POA: Diagnosis not present

## 2024-02-29 DIAGNOSIS — Y633 Inadvertent exposure of patient to radiation during medical care: Secondary | ICD-10-CM | POA: Insufficient documentation

## 2024-02-29 DIAGNOSIS — D6869 Other thrombophilia: Secondary | ICD-10-CM | POA: Insufficient documentation

## 2024-02-29 DIAGNOSIS — I10 Essential (primary) hypertension: Secondary | ICD-10-CM | POA: Insufficient documentation

## 2024-02-29 DIAGNOSIS — Z7901 Long term (current) use of anticoagulants: Secondary | ICD-10-CM | POA: Diagnosis not present

## 2024-02-29 DIAGNOSIS — I480 Paroxysmal atrial fibrillation: Secondary | ICD-10-CM

## 2024-02-29 DIAGNOSIS — Z950 Presence of cardiac pacemaker: Secondary | ICD-10-CM | POA: Diagnosis not present

## 2024-02-29 DIAGNOSIS — I4891 Unspecified atrial fibrillation: Secondary | ICD-10-CM | POA: Diagnosis present

## 2024-02-29 HISTORY — PX: ATRIAL FIBRILLATION ABLATION: EP1191

## 2024-02-29 SURGERY — ATRIAL FIBRILLATION ABLATION
Anesthesia: General

## 2024-02-29 MED ORDER — CEFAZOLIN SODIUM-DEXTROSE 2-4 GM/100ML-% IV SOLN
INTRAVENOUS | Status: AC
  Filled 2024-02-29: qty 100

## 2024-02-29 MED ORDER — RIVAROXABAN 20 MG PO TABS
20.0000 mg | ORAL_TABLET | Freq: Every day | ORAL | Status: DC
  Administered 2024-02-29: 20 mg via ORAL
  Filled 2024-02-29: qty 1

## 2024-02-29 MED ORDER — SUGAMMADEX SODIUM 200 MG/2ML IV SOLN
INTRAVENOUS | Status: DC | PRN
  Administered 2024-02-29 (×2): 200 mg via INTRAVENOUS

## 2024-02-29 MED ORDER — SODIUM CHLORIDE 0.9 % IV SOLN: 250.0000 mL | INTRAVENOUS | Status: DC | PRN

## 2024-02-29 MED ORDER — MIDAZOLAM HCL 2 MG/2ML IJ SOLN
INTRAMUSCULAR | Status: AC
  Filled 2024-02-29: qty 2

## 2024-02-29 MED ORDER — LIDOCAINE 2% (20 MG/ML) 5 ML SYRINGE
INTRAMUSCULAR | Status: DC | PRN
  Administered 2024-02-29: 60 mg via INTRAVENOUS

## 2024-02-29 MED ORDER — ACETAMINOPHEN 325 MG PO TABS: 650.0000 mg | ORAL_TABLET | ORAL | Status: DC | PRN

## 2024-02-29 MED ORDER — LEVOTHYROXINE SODIUM 25 MCG PO TABS
25.0000 ug | ORAL_TABLET | Freq: Every day | ORAL | Status: DC
  Filled 2024-02-29 (×2): qty 1

## 2024-02-29 MED ORDER — PHENYLEPHRINE 80 MCG/ML (10ML) SYRINGE FOR IV PUSH (FOR BLOOD PRESSURE SUPPORT)
PREFILLED_SYRINGE | INTRAVENOUS | Status: DC | PRN
  Administered 2024-02-29 (×2): 80 ug via INTRAVENOUS

## 2024-02-29 MED ORDER — ROSUVASTATIN CALCIUM 5 MG PO TABS
10.0000 mg | ORAL_TABLET | Freq: Every day | ORAL | Status: DC
  Administered 2024-02-29: 10 mg via ORAL
  Filled 2024-02-29 (×2): qty 2

## 2024-02-29 MED ORDER — FENTANYL CITRATE (PF) 250 MCG/5ML IJ SOLN
INTRAMUSCULAR | Status: DC | PRN
  Administered 2024-02-29 (×2): 50 ug via INTRAVENOUS

## 2024-02-29 MED ORDER — DEXAMETHASONE SODIUM PHOSPHATE 10 MG/ML IJ SOLN
INTRAMUSCULAR | Status: DC | PRN
  Administered 2024-02-29: 10 mg via INTRAVENOUS

## 2024-02-29 MED ORDER — ONDANSETRON HCL 4 MG/2ML IJ SOLN
INTRAMUSCULAR | Status: DC | PRN
  Administered 2024-02-29: 4 mg via INTRAVENOUS

## 2024-02-29 MED ORDER — SODIUM CHLORIDE 0.9% FLUSH: 3.0000 mL | INTRAVENOUS | Status: DC | PRN

## 2024-02-29 MED ORDER — FENTANYL CITRATE (PF) 100 MCG/2ML IJ SOLN: 25.0000 ug | INTRAMUSCULAR | Status: DC | PRN

## 2024-02-29 MED ORDER — SODIUM CHLORIDE 0.9% FLUSH
3.0000 mL | Freq: Two times a day (BID) | INTRAVENOUS | Status: DC
  Administered 2024-02-29: 3 mL via INTRAVENOUS

## 2024-02-29 MED ORDER — HEPARIN SODIUM (PORCINE) 1000 UNIT/ML IJ SOLN
INTRAMUSCULAR | Status: DC | PRN
  Administered 2024-02-29: 13000 [IU] via INTRAVENOUS
  Administered 2024-02-29 (×2): 1000 [IU] via INTRAVENOUS

## 2024-02-29 MED ORDER — ROCURONIUM BROMIDE 10 MG/ML (PF) SYRINGE
PREFILLED_SYRINGE | INTRAVENOUS | Status: DC | PRN
  Administered 2024-02-29: 60 mg via INTRAVENOUS
  Administered 2024-02-29: 20 mg via INTRAVENOUS
  Administered 2024-02-29: 40 mg via INTRAVENOUS
  Administered 2024-02-29 (×2): 20 mg via INTRAVENOUS

## 2024-02-29 MED ORDER — PROPOFOL 500 MG/50ML IV EMUL
INTRAVENOUS | Status: DC | PRN
Start: 2024-02-29 — End: 2024-02-29
  Administered 2024-02-29: 100 ug/kg/min via INTRAVENOUS

## 2024-02-29 MED ORDER — MIDAZOLAM HCL 2 MG/2ML IJ SOLN
INTRAMUSCULAR | Status: DC | PRN
  Administered 2024-02-29: 2 mg via INTRAVENOUS

## 2024-02-29 MED ORDER — ONDANSETRON HCL 4 MG/2ML IJ SOLN: 4.0000 mg | Freq: Four times a day (QID) | INTRAMUSCULAR | Status: DC | PRN

## 2024-02-29 MED ORDER — HEPARIN (PORCINE) IN NACL 1000-0.9 UT/500ML-% IV SOLN
INTRAVENOUS | Status: DC | PRN
  Administered 2024-02-29 (×3): 500 mL

## 2024-02-29 MED ORDER — CEFAZOLIN SODIUM-DEXTROSE 2-4 GM/100ML-% IV SOLN
2.0000 g | Freq: Once | INTRAVENOUS | Status: AC
  Administered 2024-02-29: .4 g via INTRAVENOUS
  Administered 2024-02-29: 1.8 g via INTRAVENOUS

## 2024-02-29 MED ORDER — FENTANYL CITRATE (PF) 100 MCG/2ML IJ SOLN
INTRAMUSCULAR | Status: AC
  Filled 2024-02-29: qty 2

## 2024-02-29 MED ORDER — METOPROLOL SUCCINATE ER 25 MG PO TB24
25.0000 mg | ORAL_TABLET | Freq: Every day | ORAL | Status: DC
  Administered 2024-02-29: 25 mg via ORAL
  Filled 2024-02-29: qty 1

## 2024-02-29 MED ORDER — PANTOPRAZOLE SODIUM 40 MG PO TBEC
40.0000 mg | DELAYED_RELEASE_TABLET | Freq: Every evening | ORAL | Status: DC
  Filled 2024-02-29: qty 1

## 2024-02-29 MED ORDER — ACETAMINOPHEN 10 MG/ML IV SOLN: 1000.0000 mg | Freq: Once | INTRAVENOUS | Status: DC | PRN

## 2024-02-29 MED ORDER — SODIUM CHLORIDE 0.9 % IV SOLN: INTRAVENOUS | Status: DC

## 2024-02-29 MED ORDER — PHENYLEPHRINE HCL-NACL 20-0.9 MG/250ML-% IV SOLN
INTRAVENOUS | Status: DC | PRN
  Administered 2024-02-29: 20 ug/min via INTRAVENOUS

## 2024-02-29 MED ORDER — PROPOFOL 10 MG/ML IV BOLUS
INTRAVENOUS | Status: DC | PRN
  Administered 2024-02-29: 140 mg via INTRAVENOUS

## 2024-02-29 SURGICAL SUPPLY — 23 items
BAG SNAP BAND KOVER 36X36 (MISCELLANEOUS) IMPLANT
BLANKET WARM UNDERBOD FULL ACC (MISCELLANEOUS) ×2 IMPLANT
CABLE FARASTAR GEN2 SNGL USE (CABLE) IMPLANT
CATH FARAWAVE 2.0 31 (CATHETERS) IMPLANT
CATH GE 8FR SOUNDSTAR (CATHETERS) IMPLANT
CATH OCTARAY 2.0 F 3-3-3-3-3 (CATHETERS) IMPLANT
CATH WEB BI DIR CSDF CRV REPRO (CATHETERS) IMPLANT
CLOSURE PERCLOSE PROSTYLE (VASCULAR PRODUCTS) IMPLANT
COVER SWIFTLINK CONNECTOR (BAG) ×2 IMPLANT
DEVICE CLOSURE MYNXGRIP 6/7F (Vascular Products) IMPLANT
DILATOR VESSEL 38 20CM 16FR (INTRODUCER) IMPLANT
GUIDEWIRE INQWIRE 1.5J.035X260 (WIRE) IMPLANT
KIT VERSACROSS CNCT FARADRIVE (KITS) IMPLANT
PACK EP LF (CUSTOM PROCEDURE TRAY) ×2 IMPLANT
PAD DEFIB RADIO PHYSIO CONN (PAD) ×2 IMPLANT
PATCH CARTO3 (PAD) IMPLANT
SHEATH FARADRIVE STEERABLE (SHEATH) IMPLANT
SHEATH INTROD W/O MIN 9FR 25CM (SHEATH) IMPLANT
SHEATH PINNACLE 8F 10CM (SHEATH) IMPLANT
SHEATH PINNACLE 9F 10CM (SHEATH) IMPLANT
SHEATH PROBE COVER 6X72 (BAG) IMPLANT
WIRE AMPLATZ SS-J .035X180CM (WIRE) IMPLANT
WIRE HI TORQ VERSACORE-J 145CM (WIRE) IMPLANT

## 2024-02-29 NOTE — Plan of Care (Signed)
  Problem: Education: Goal: Knowledge of General Education information will improve Description: Including pain rating scale, medication(s)/side effects and non-pharmacologic comfort measures Outcome: Progressing   Problem: Health Behavior/Discharge Planning: Goal: Ability to manage health-related needs will improve Outcome: Progressing   Problem: Clinical Measurements: Goal: Ability to maintain clinical measurements within normal limits will improve Outcome: Progressing   Problem: Cardiac: Goal: Ability to maintain adequate cardiovascular perfusion will improve Outcome: Progressing

## 2024-02-29 NOTE — Transfer of Care (Signed)
 Immediate Anesthesia Transfer of Care Note  Patient: Kari [name]  Procedure(s) Performed: ATRIAL FIBRILLATION ABLATION  Patient Location: PACU  Anesthesia Type:General  Level of Consciousness: sedated, drowsy, and responds to stimulation  Airway & Oxygen Therapy: Patient Spontanous Breathing and Patient connected to face mask oxygen  Post-op Assessment: Report given to RN and Post -op Vital signs reviewed and stable  Post vital signs: Reviewed and stable  Last Vitals:  Vitals Value Taken Time  BP 87/53 02/29/24 11:00  Temp    Pulse 60 02/29/24 11:03  Resp 20 02/29/24 11:03  SpO2 95 % 02/29/24 11:03  Vitals shown include unfiled device data.  Last Pain: There were no vitals filed for this visit.       Complications: No notable events documented.

## 2024-02-29 NOTE — Anesthesia Postprocedure Evaluation (Signed)
 Anesthesia Post Note  Patient: Kari [name]  Procedure(s) Performed: ATRIAL FIBRILLATION ABLATION     Patient location during evaluation: PACU Anesthesia Type: General Level of consciousness: awake and alert Pain management: pain level controlled Vital Signs Assessment: post-procedure vital signs reviewed and stable Respiratory status: spontaneous breathing, nonlabored ventilation, respiratory function stable and patient connected to nasal cannula oxygen Cardiovascular status: blood pressure returned to baseline and stable Postop Assessment: no apparent nausea or vomiting Anesthetic complications: no   No notable events documented.  Last Vitals:  Vitals:   02/29/24 1400 02/29/24 1516  BP: 120/88 120/69  Pulse: 66 64  Resp: (!) 21 20  Temp:  36.4 C  SpO2: 94% 94%    Last Pain:  Vitals:   02/29/24 1516  TempSrc: Oral  PainSc:                  Cordella SQUIBB Vauda Salvucci

## 2024-02-29 NOTE — H&P (Signed)
 Electrophysiology Note:   Date:  02/29/24  ID:  Kari [name], Kari [name] [birth year], MRN [id number]   Primary Cardiologist: Danelle Birmingham, MD Electrophysiologist: Danelle Birmingham, MD       History of Present Illness:   Kari [name] is a 73 y.o. 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   Discussed the use of AI scribe software for clinical note transcription with the patient, who gave verbal consent to proceed.   History of Present Illness Kari [name] is a 74 year old female with a pacemaker who presents with concerns about atrial fibrillation and anticoagulation management. She has been experiencing atrial fibrillation since August of last year, as detected by her pacemaker device. Episodes last up to eight hours, which she was unaware of at the time. 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 She is currently taking Xarelto  for the atrial fibrillation episodes. She is concerned about the risks associated with anticoagulation, particularly the fear of head injury due to her active lifestyle. Working in the emergency department and being active at home makes her apprehensive about the potential for bleeding. She has a history of heart block, which led to her pacemaker dependency. She recalls an incident while golfing in [place]  when she felt unusually fatigued and discovered her pulse was thirty, prompting her to seek medical attention. She has always had a low resting heart rate and has been active throughout her life. No high blood pressure, diabetes, or sleep apnea. She does not consume alcohol frequently and does not have a history of caffeine affecting her heart rate.  Interval: Patient presents today for scheduled ablation. Reports feeling relatively well. No new or acute  complaints.   Review of systems complete and found to be negative unless listed in HPI.    EP Information / Studies Reviewed:     EKG is not ordered today. EKG from 12/21/23 reviewed which showed AS / VP rhythm.         CTA Coronary 12/24/23:  1. Left Main: Normal 2. LAD: 0.95 post moderate lesion non flow-limiting 3. LCX: Normal 4. Ramus: Not applicable 5. RCA: Normal   IMPRESSION: 1. 0.95 FFR post moderate mid LAD lesion.  Non flow-limiting.   Risk Assessment/Calculations:     CHA2DS2-VASc Score = 3   This indicates a 3.2% annual risk of stroke. The patient's score is based upon: CHF History: 0 HTN History: 1 Diabetes History: 0 Stroke History: 0 Vascular Disease History: 0 Age Score: 1 Gender Score: 1               Physical Exam:    Today's Vitals   02/29/24 0603  BP: (!) 144/80  Pulse: 82  Resp: 14  Temp: 98.5 F (36.9 C)  SpO2: 94%  Weight: 70.3 kg  Height: 5' 8 (1.727 m)   Body mass index is 23.57 kg/m.   GEN: Well nourished, well developed in no acute distress NECK: No JVD CARDIAC: Normal rate, regular rhythm RESPIRATORY:  Clear to auscultation without rales, wheezing or rhonchi  ABDOMEN: Soft, non-distended EXTREMITIES:  No edema; No deformity    ASSESSMENT AND PLAN:     #. Paroxysmal atrial fibrillation: Device detected. First detected in 2024. Patient is very concerned about progression to persistent AF and long term risks of  stroke, dementia, CHF.  #. Secondary hypercoagulable state due to atrial fibrillation: CHA2DS2-VASc 3. She would like to come off [facility] due to lifestyle, very active. Still working in ED.   - We discussed options for continued monitoring vs catheter ablation for early intervention and patient favors catheter ablation. Risk, benefits, and alternatives to EP study and ablation for afib were discussed. These risks include but are not limited to stroke, bleeding, vascular damage, tamponade, perforation, damage to the esophagus,  lungs, phrenic nerve and other structures, pulmonary vein stenosis, worsening renal function, coronary vasospasm and death.  Discussed potential need for repeat ablation procedures and antiarrhythmic drugs after an initial ablation. The patient understands these risk and wishes to proceed today. - Continue Xarelto  20mg  in anticipation of ablation. She does not want to remain on long-term anticoagulation nor antiarrhythmics if possible. We will tentatively plan to stop 3 months after ablation and monitor for recurrence with her device. Follow up with Dr. Kennyth 3 months after ablation.    Signed, Fonda Kennyth, MD

## 2024-02-29 NOTE — Anesthesia Preprocedure Evaluation (Signed)
 Anesthesia Evaluation  Patient identified by MRN, date of birth, ID band Patient awake    Reviewed: Allergy & Precautions, NPO status , Patient's Chart, lab work & pertinent test results  History of Anesthesia Complications (+) PONV and history of anesthetic complications  Airway Mallampati: II  TM Distance: >3 FB Neck ROM: Full    Dental no notable dental hx.    Pulmonary neg pulmonary ROS   Pulmonary exam normal        Cardiovascular hypertension, Pt. on medications and Pt. on home beta blockers + dysrhythmias Atrial Fibrillation + pacemaker  Rhythm:Regular Rate:Normal     Neuro/Psych negative neurological ROS  negative psych ROS   GI/Hepatic Neg liver ROS,GERD  Medicated,,  Endo/Other  Hypothyroidism    Renal/GU negative Renal ROS  negative genitourinary   Musculoskeletal  (+) Arthritis , Osteoarthritis,    Abdominal Normal abdominal exam  (+)   Peds  Hematology  (+) Blood dyscrasia, anemia Lab Results      Component                Value               Date                      WBC                      5.4                 02/24/2024                HGB                      12.2                02/24/2024                HCT                      39.7                02/24/2024                MCV                      99 (H)              02/24/2024                PLT                      252                 02/24/2024             Lab Results      Component                Value               Date                      NA                       141                 02/24/2024  K                        4.9                 02/24/2024                CO2                      21                  02/24/2024                GLUCOSE                  96                  02/24/2024                BUN                      12                  02/24/2024                CREATININE               0.74                 02/24/2024                CALCIUM                   9.6                 02/24/2024                GFR                      85.60               01/28/2024                EGFR                     86                  02/24/2024                GFRNONAA                 >60                 12/18/2023              Anesthesia Other Findings   Reproductive/Obstetrics                              Anesthesia Physical Anesthesia Plan  ASA: 3  Anesthesia Plan: General   Post-op Pain Management:    Induction: Intravenous  PONV Risk Score and Plan: 4 or greater and Ondansetron , Dexamethasone  and Treatment may vary due to age or medical condition  Airway Management Planned: Mask and Oral ETT  Additional Equipment: None  Intra-op Plan:   Post-operative Plan: Extubation in OR  Informed Consent: I have reviewed the patients History and Physical, chart, labs and discussed the procedure including the risks, benefits and alternatives for  the proposed anesthesia with the patient or authorized representative who has indicated his/her understanding and acceptance.     Dental advisory given  Plan Discussed with: CRNA  Anesthesia Plan Comments:         Anesthesia Quick Evaluation

## 2024-02-29 NOTE — Anesthesia Procedure Notes (Signed)
 Procedure Name: Intubation Date/Time: 02/29/2024 8:18 AM  Performed by: Mollie Olivia SAUNDERS, CRNAPre-anesthesia Checklist: Patient identified, Emergency Drugs available, Suction available and Patient being monitored Patient Re-evaluated:Patient Re-evaluated prior to induction Oxygen Delivery Method: Circle system utilized Preoxygenation: Pre-oxygenation with 100% oxygen Induction Type: IV induction and Cricoid Pressure applied Ventilation: Mask ventilation without difficulty Laryngoscope Size: Mac and 3 Grade View: Grade II Tube type: Oral Tube size: 7.0 mm Number of attempts: 1 Airway Equipment and Method: Stylet Placement Confirmation: ETT inserted through vocal cords under direct vision, positive ETCO2 and breath sounds checked- equal and bilateral Secured at: 22 cm Tube secured with: Tape Dental Injury: Teeth and Oropharynx as per pre-operative assessment  Comments: Slightly anterior

## 2024-03-01 DIAGNOSIS — I480 Paroxysmal atrial fibrillation: Secondary | ICD-10-CM | POA: Diagnosis not present

## 2024-03-01 DIAGNOSIS — Y633 Inadvertent exposure of patient to radiation during medical care: Secondary | ICD-10-CM | POA: Insufficient documentation

## 2024-03-01 LAB — POCT ACTIVATED CLOTTING TIME
Activated Clotting Time: 314 s
Activated Clotting Time: 320 s

## 2024-03-01 LAB — HEMOGLOBIN AND HEMATOCRIT, BLOOD
HCT: 31.5 % — ABNORMAL LOW (ref 36.0–46.0)
Hemoglobin: 10.4 g/dL — ABNORMAL LOW (ref 12.0–15.0)

## 2024-03-01 MED ORDER — COLCHICINE 0.6 MG PO TABS: 0.6000 mg | ORAL_TABLET | Freq: Two times a day (BID) | ORAL | 0 refills | Status: DC

## 2024-03-01 MED ORDER — ACETAMINOPHEN 325 MG PO TABS: 650.0000 mg | ORAL_TABLET | ORAL | Status: DC | PRN

## 2024-03-01 NOTE — TOC Transition Note (Signed)
 Transition of Care [facility]) - Discharge Note   Patient Details  Name: Kari [name] MRN: [id number] Date of Birth: 1950/10/27  [facility] CM/SW Contact:  Waddell Barnie Rama, RN Phone Number: 03/01/2024, 9:01 AM   Clinical Narrative:    For dc today, has no needs.         Patient Goals and CMS Choice            Discharge Placement                       Discharge Plan and Services Additional resources added to the After Visit Summary for                                       Social Drivers of Health (SDOH) Interventions SDOH Screenings   Food Insecurity: No Food Insecurity (02/29/2024)  Housing: Low Risk  (02/29/2024)  Transportation Needs: No Transportation Needs (02/29/2024)  Utilities: Not At Risk (02/29/2024)  Depression (PHQ2-9): Low Risk  (02/09/2024)  Social Connections: Socially Isolated (02/29/2024)  Tobacco Use: Low Risk  (02/09/2024)     Readmission Risk Interventions     No data to display

## 2024-03-01 NOTE — Discharge Summary (Addendum)
 ELECTROPHYSIOLOGY PROCEDURE DISCHARGE SUMMARY    Patient ID: Kari [name],  MRN: [id number], DOB/AGE: 1950/11/21 73 y.o.  Admit date: 02/29/2024 Discharge date: 03/01/2024  Primary Care Physician: Frann Mabel Mt, DO  Primary Cardiologist: Danelle Birmingham, MD  Electrophysiologist: Dr. Kennyth   Primary Discharge Diagnosis:  Atrial Fibrillation  Procedures This Admission:  Electrophysiology study and Pulse Field catheter ablation of Atrial Fibrillation on 02/29/2024 by Dr. Kennyth .   This study demonstrated  CONCLUSIONS: 1. Successful PVI. 2. Intracardiac echo reveals normal LV size and function, trivial pericardial effusion. 3. No early apparent complications. 4. Resume Xarelto  in recovery area. .    Pt received only ~ 30 minutes of fluoroscopy; still reviewed risk of adverse skin effects.    Brief HPI: Kari [name] is a 73 y.o. female with a history of Atrial Fibrillation. Risks, benefits, and alternatives to catheter ablation of Atrial Fibrillation were reviewed with the patient who wished to proceed.   The patient has been on uninterrupted anticoagulation for more than 3 weeks and did not require TEE.  Hospital Course:  The patient was admitted and underwent EPS/RFCA of Atrial Fibrillation with details as outlined above.  They were monitored on telemetry overnight which demonstrated NSR,  A pacing.  Groin was without complication on the day of discharge.  The patient was examined and considered to be stable for discharge.  Wound care and restrictions were reviewed with the patient.  The patient will be seen back by Afib Clinic in 4 weeks   Physical Exam: Vitals:   02/29/24 1516 02/29/24 1931 03/01/24 0042 03/01/24 0332  BP: 120/69 (!) 111/59 (!) 103/57 115/66  Pulse: 64 69 69 60  Resp: 20 20 18 18   Temp: 97.6 F (36.4 C) (!) 97.4 F (36.3 C) 97.7 F (36.5 C) 98.3 F (36.8 C)  TempSrc: Oral Oral Oral Oral  SpO2: 94% 93% 92% 95%  Weight:    76.8 kg   Height:        GEN- NAD. A&O x 3.  HEENT: Normocephalic, atraumatic Lungs- CTAB, Normal effort.  Heart- RRR, No M/G/R.  GI- Soft, NT, ND.  Extremities- No clubbing, cyanosis, or edema;  Skin- warm and dry, no rash or lesion, left chest without hematoma/ecchymosis  Discharge Medications:  Allergies as of 03/01/2024       Reactions   Eliquis  [apixaban ] Palpitations   Diaphoresis, tachycardia   Bactrim  [sulfamethoxazole -trimethoprim ] Hives   Ceftin Itching   Tolerates amoxicillin    Vancomycin  Hives   Doxycycline     Difficulty swallowing.    Levofloxacin Other (See Comments)   spacey, tolerates Cipro         Medication List     TAKE these medications    acetaminophen  325 MG tablet Commonly known as: TYLENOL  Take 2 tablets (650 mg total) by mouth every 4 (four) hours as needed for headache or mild pain (pain score 1-3).   CITRACAL CALCIUM +D 600-40-500 MG-MG-UNIT Tb24 Generic drug: Calcium -Magnesium -Vitamin D  Take 1 tablet by mouth every evening.   colchicine  0.6 MG tablet Take 1 tablet (0.6 mg total) by mouth 2 (two) times daily for 5 days.   CoQ10 200 MG Caps Take 200 mg by mouth every evening.   diclofenac  sodium 1 % Gel Commonly known as: VOLTAREN  Apply 1-2 g topically 4 (four) times daily as needed (knee pain.).   EPINEPHrine  0.3 mg/0.3 mL Soaj injection Commonly known as: EPI-PEN Inject 0.3 mg into the muscle as needed for anaphylaxis.   esomeprazole 40 MG capsule  Commonly known as: NEXIUM Take 40 mg by mouth every evening.   fluticasone  50 MCG/ACT nasal spray Commonly known as: FLONASE  Place 1 spray into both nostrils daily as needed for allergies or rhinitis.   levothyroxine  25 MCG tablet Commonly known as: SYNTHROID  Take 1 tablet (25 mcg total) by mouth daily before breakfast.   metoprolol  succinate 25 MG 24 hr tablet Commonly known as: Toprol  XL Take 1 tablet (25 mg total) by mouth daily.   multivitamin with minerals Tabs tablet Take 1  tablet by mouth every evening. Women 50+   PROBIOTIC FORMULA PO Take 1 capsule by mouth every evening.   rivaroxaban  20 MG Tabs tablet Commonly known as: XARELTO  Take 1 tablet (20 mg total) by mouth daily with supper.   rosuvastatin  10 MG tablet Commonly known as: Crestor  Take 1 tablet (10 mg total) by mouth daily.   SUPER B COMPLEX PO Take 2 capsules by mouth every evening.   valACYclovir  1000 MG tablet Commonly known as: VALTREX  Take 1 tablet (1,000 mg total) by mouth daily as needed.   VITAMIN B-12 SL Take 1 tablet by mouth every evening.   Vitamin D3 125 MCG (5000 UT) Caps Take 5,000 Units by mouth every evening.        Disposition: Home with usual follow up as in AVS  Duration of Discharge Encounter:  APP time: 26 minutes  Signed, Ozell Prentice Passey, PA-C  03/01/2024 7:52 AM  I have seen, examined the patient, and reviewed the above assessment and plan.    Hospital Course: Patient presented for scheduled catheter ablation for atrial fibrillation.  She underwent successful pulmonary vein isolation.  Posterior wall was not ablated.  Patient was kept overnight for routine monitoring.  There was some difficulty advancing catheters up the right femoral venous system.  There was no bleeding postoperatively or hematoma.  There were no acute overnight events.  Patient was hemodynamically stable.  This morning she reported feeling relatively well with no new or acute complaints.  General: Well developed, in no acute distress.  Neck: No JVD.  Cardiac: Normal rate, regular rhythm.  Resp: Normal work of breathing.  Ext: No edema.  Groins: Soft bilaterally without hematoma or bleeding. Neuro: No gross focal deficits.  Psych: Normal affect.   Plan:  - Continue uninterrupted Xarelto  for the next 90 days. - Monitor AF burden with pacemaker. - Usual post ablation instructions regarding activity restrictions and wound care were provided. - 1 month follow-up with AF  clinic and 61-month follow-up with me.  Fonda Kitty, MD 03/01/2024 8:36 PM

## 2024-03-01 NOTE — TOC CM/SW Note (Signed)
 Transition of Care [facility]) - Inpatient Brief Assessment   Patient Details  Name: Kari [name] MRN: [id number] Date of Birth: Nov 18, 1950  [facility] CM/SW Contact:    Waddell Barnie Rama, RN Phone Number: 03/01/2024, 9:00 AM   Clinical Narrative: From home with spouse, has PCP and insurance on file, states has no HH services in place at this time or DME at home.  States family member will transport them home at [employer] and family is support system.  Pta self ambulatory .   Transition of Care Asessment: Insurance and Status: Insurance coverage has been reviewed Patient has primary care physician: Yes Home environment has been reviewed: home with spouse Prior level of function:: indep Prior/Current Home Services: No current home services Social Drivers of Health Review: SDOH reviewed no interventions necessary Readmission risk has been reviewed: Yes Transition of care needs: no transition of care needs at this time

## 2024-03-04 ENCOUNTER — Inpatient Hospital Stay: Admitting: Family Medicine

## 2024-03-04 MED FILL — Fentanyl Citrate Preservative Free (PF) Inj 100 MCG/2ML: INTRAMUSCULAR | Qty: 2 | Status: AC

## 2024-03-07 ENCOUNTER — Other Ambulatory Visit (HOSPITAL_COMMUNITY): Payer: Self-pay

## 2024-03-07 ENCOUNTER — Other Ambulatory Visit: Payer: Self-pay

## 2024-03-09 ENCOUNTER — Ambulatory Visit: Payer: Self-pay | Admitting: Family Medicine

## 2024-03-09 ENCOUNTER — Ambulatory Visit: Admitting: Family Medicine

## 2024-03-09 ENCOUNTER — Encounter: Payer: Self-pay | Admitting: Family Medicine

## 2024-03-09 VITALS — BP 130/78 | HR 61 | Temp 98.0°F | Resp 16 | Ht 68.0 in | Wt 155.0 lb

## 2024-03-09 DIAGNOSIS — D649 Anemia, unspecified: Secondary | ICD-10-CM

## 2024-03-09 DIAGNOSIS — R718 Other abnormality of red blood cells: Secondary | ICD-10-CM

## 2024-03-09 LAB — B12 AND FOLATE PANEL
Folate: 17.2 ng/mL (ref >5.9)
Vitamin B-12: >1500 pg/mL — ABNORMAL HIGH (ref 211–911)

## 2024-03-09 LAB — CBC
HCT: 33.5 % — ABNORMAL LOW (ref 36.0–46.0)
Hemoglobin: 11.3 g/dL — ABNORMAL LOW (ref 12.0–15.0)
MCHC: 33.7 g/dL (ref 30.0–36.0)
MCV: 91.8 fl (ref 78.0–100.0)
Platelets: 262 K/uL (ref 150.0–400.0)
RBC: 3.64 Mil/uL — ABNORMAL LOW (ref 3.87–5.11)
RDW: 14.1 % (ref 11.5–15.5)
WBC: 4.5 K/uL (ref 4.0–10.5)

## 2024-03-09 LAB — IBC + FERRITIN
Ferritin: 51.9 ng/mL (ref 10.0–291.0)
Iron: 47 ug/dL (ref 42–145)
Saturation Ratios: 12.3 % — ABNORMAL LOW (ref 20.0–50.0)
TIBC: 383.6 ug/dL (ref 250.0–450.0)
Transferrin: 274 mg/dL (ref 212.0–360.0)

## 2024-03-09 NOTE — Progress Notes (Signed)
 Chief Complaint  Patient presents with   Follow-up    Follow Up    Subjective: Patient is a 73 y.o. female here for f/u labs.  Patient had a cardiac ablation with the EP team on 02/29/2024.  Preprocedure labs showed an elevated MCV.  She started taking a B complex vitamin.  Postprocedural labs showed a decreased hemoglobin which was not observed prior to.  She is not an alcoholic.  She remains physically active and has a healthy diet.  No blood in her stool or urine.  She is on Xarelto  for her atrial fibrillation.  Past Medical History:  Diagnosis Date   Arthritis    Chronic back pain    Diverticulitis    DVT (deep venous thrombosis) (HCC) age 19   s/p  knee arthroscopy    GERD (gastroesophageal reflux disease)    Herpes zoster conjunctivitis    Hypothyroidism    Menopause    Mobitz type 2 second degree AV block    2:1/notes 02/16/2013   Pneumonia 2014   PONV (postoperative nausea and vomiting)    Presence of permanent cardiac pacemaker    Seasonal allergies    spring & fall; not q year (02/17/2013)   Shoulder impingement    and left rotator cuff tear    Objective: BP 130/78 (BP Location: Left Arm, Patient Position: Sitting)   Pulse 61   Temp 98 F (36.7 C) (Oral)   Resp 16   Ht 5' 8 (1.727 m)   Wt 155 lb (70.3 kg)   SpO2 98%   BMI 23.57 kg/m  General: Awake, appears stated age Heart: RRR, no LE edema Lungs: CTAB, no rales, wheezes or rhonchi. No accessory muscle use Psych: Age appropriate judgment and insight, normal affect and mood  Assessment and Plan: Low hemoglobin - Plan: CBC, IBC + Ferritin, B12 and Folate Panel  Elevated MCV - Plan: B12 and Folate Panel  Okay to continue B complex vitamin.  Could have been dilutional.  No anemia associated with this though.  Check above labs.  Okay to continue vitamin B supplement for now.  Follow-up as originally scheduled. The patient voiced understanding and agreement to the plan.  Mabel Mt [place],  DO 03/09/24  8:00 AM

## 2024-03-09 NOTE — Patient Instructions (Signed)
 Give [place] 2-3 business days to get the results of your labs back.  Let us know if you need anything.

## 2024-03-11 ENCOUNTER — Encounter: Admitting: Obstetrics and Gynecology

## 2024-03-11 ENCOUNTER — Encounter: Payer: Self-pay | Admitting: Advanced Practice Midwife

## 2024-03-22 ENCOUNTER — Ambulatory Visit: Payer: Commercial Managed Care - PPO

## 2024-03-22 DIAGNOSIS — I442 Atrioventricular block, complete: Secondary | ICD-10-CM

## 2024-03-23 LAB — CUP PACEART REMOTE DEVICE CHECK
Battery Remaining Longevity: 132 mo
Battery Remaining Percentage: 100 %
Brady Statistic RA Percent Paced: 50 %
Brady Statistic RV Percent Paced: 100 %
Date Time Interrogation Session: 20250729173200
Implantable Lead Connection Status: 753985
Implantable Lead Connection Status: 753985
Implantable Lead Implant Date: 20230731
Implantable Lead Implant Date: 20230731
Implantable Lead Location: 753859
Implantable Lead Location: 753860
Implantable Lead Model: 7841
Implantable Lead Model: 7842
Implantable Lead Serial Number: 1215821
Implantable Lead Serial Number: 1304218
Implantable Pulse Generator Implant Date: 20230731
Lead Channel Impedance Value: 741 Ohm
Lead Channel Impedance Value: 809 Ohm
Lead Channel Setting Pacing Amplitude: 2 V
Lead Channel Setting Pacing Amplitude: 2.5 V
Lead Channel Setting Pacing Pulse Width: 0.6 ms
Lead Channel Setting Sensing Sensitivity: 4 mV
Pulse Gen Serial Number: 600527
Zone Setting Status: 755011

## 2024-03-25 ENCOUNTER — Ambulatory Visit: Admitting: Orthopaedic Surgery

## 2024-03-25 ENCOUNTER — Other Ambulatory Visit (HOSPITAL_COMMUNITY): Payer: Self-pay

## 2024-03-25 DIAGNOSIS — Z96642 Presence of left artificial hip joint: Secondary | ICD-10-CM

## 2024-03-25 MED ORDER — AMOXICILLIN 500 MG PO CAPS
2000.0000 mg | ORAL_CAPSULE | Freq: Once | ORAL | 0 refills | Status: AC
Start: 2024-03-25 — End: 2024-03-25
  Filled 2024-03-25: qty 4, 1d supply, fill #0

## 2024-03-25 NOTE — Progress Notes (Signed)
 Post-Op Visit Note   Patient: Kari  TONEISHA [name]           Date of Birth: 30-Dec-1950           MRN: [id number] Visit Date: 03/25/2024 PCP: Frann Mabel Mt, DO   Assessment & Plan:  Chief Complaint:  Chief Complaint  Patient presents with   Left Hip - Pain   Visit Diagnoses:  1. Status post total replacement of left hip     Plan: History of Present Illness Kari  L Tanasha [name] is a 73 year old female who presents for 65-month follow-up after left hip replacement surgery.  She is pleased with the outcome of her hip replacement surgery and has returned to her baseline activity and function, including working and playing golf. She experiences issues with her other hip but does not feel intervention is necessary at this time. She inquires about weight restrictions.  Physical Exam Fully healed surgical scar.  Fluid painless range of motion  Assessment and Plan Status post right total hip arthroplasty Three months post-surgery with satisfactory outcome and return to baseline function. Discussed activity restrictions and dislocation risk. Advised on prophylactic antibiotics for dental procedures. - Allow return to activities as tolerated, avoiding high-risk activities like yoga. - Prescribe amoxicillin  for dental prophylaxis before cleanings for two years post-surgery. - Send prescription for amoxicillin  to [facility]. - Schedule follow-up visit in three months for six-month post-operative evaluation and radiographs.  Follow-Up Instructions: Return in about 3 months (around 06/25/2024).   Orders:  No orders of the defined types were placed in this encounter.  Meds ordered this encounter  Medications   amoxicillin  (AMOXIL ) 500 MG capsule    Sig: Take 4 capsules (2,000 mg total) by mouth once for 1 dose.    Dispense:  4 capsule    Refill:  0    Imaging: No results found.  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   Painful total knee replacement, right (HCC) 04/12/2019   Near syncope 03/11/2013   Bradycardia 02/21/2013   Pacemaker 02/20/2013   Chest tightness 02/19/2013   Palpitations 02/19/2013   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   Diverticulitis 03/03/2012   Chronic low back pain 08/28/2011   GERD (gastroesophageal reflux disease)    Herpes zoster conjunctivitis    Menopause    Arthritis    Diverticulosis    Allergy    Past Medical History:  Diagnosis Date   Arthritis    Chronic back pain    Diverticulitis    DVT (deep venous thrombosis) [facility]) age 21   s/p  knee arthroscopy    GERD (gastroesophageal reflux disease)    Herpes zoster conjunctivitis    Hypothyroidism    Menopause    Mobitz type 2 second degree AV block    2:1/notes 02/16/2013   Pneumonia 2014   PONV (postoperative nausea and vomiting)    Presence of permanent cardiac pacemaker    Seasonal allergies    spring & fall;  not q year (02/17/2013)   Shoulder impingement    and left rotator cuff tear    Family History  Problem Relation Age of Onset   Hypertension Mother    Hyperlipidemia Mother    Macular degeneration Mother    COPD Mother    Lung cancer Father    Cancer Father        Lung   COPD Father    Diabetes Brother    Breast cancer Paternal Aunt     Past Surgical History:  Procedure Laterality Date   ACHILLES TENDON SURGERY     ARTHROGRAM KNEE Right 1975   ATRIAL FIBRILLATION ABLATION N/A 02/29/2024   Procedure:  ATRIAL FIBRILLATION ABLATION;  Surgeon: Kennyth Chew, MD;  Location: [facility];  Service: Cardiovascular;  Laterality: N/A;   CHOLECYSTECTOMY  1990's   colonscopy     ESOPHAGOGASTRODUODENOSCOPY     EYE SURGERY     muscle release right eye   INSERT / REPLACE / REMOVE PACEMAKER  02/17/2013   [employer] Advantio dual-chamber pacemaker, model [id number],   KNEE ARTHROSCOPY Right 1982, 1983, 1995, 1996   KNEE ARTHROSCOPY W/ ACL RECONSTRUCTION Left 1984   no reconstruction needed   LAPAROSCOPIC SIGMOID COLECTOMY N/A 01/01/2015   Procedure: LAPAROSCOPIC SIGMOID COLECTOMY;  Surgeon: Jina Nephew, MD;  Location: [facility] OR;  Service: General;  Laterality: N/A;   LEAD EXTRACTION N/A 03/24/2022   Procedure: LEAD EXTRACTION;  Surgeon: Waddell Danelle ORN, MD;  Location: [facility];  Service: Cardiovascular;  Laterality: N/A;   PACEMAKER IMPLANT N/A 03/24/2022   Procedure: PACEMAKER IMPLANT;  Surgeon: Waddell Danelle ORN, MD;  Location: [facility];  Service: Cardiovascular;  Laterality: N/A;   PERMANENT PACEMAKER INSERTION N/A 02/17/2013   Procedure: PERMANENT PACEMAKER INSERTION;  Surgeon: Danelle ORN Waddell, MD;  Location: [facility] CATH LAB;  Service: Cardiovascular;  Laterality: N/A;   SHOULDER ARTHROSCOPY Left 09/05/2016   Procedure: LEFT SHOULDER ARTHROSCOPY , ACROMIOPLASTY AND ROTATOR CUFF REPAIR;  Surgeon: Maude Herald, MD;  Location: [facility] OR;  Service: Orthopedics;  Laterality: Left;   TONSILLECTOMY  1961   TOTAL HIP ARTHROPLASTY Left 12/28/2023   Procedure: ARTHROPLASTY, HIP, TOTAL, ANTERIOR APPROACH;  Surgeon: Jerri Kay HERO, MD;  Location: [facility] OR;  Service: Orthopedics;  Laterality: Left;  3-C   TOTAL KNEE ARTHROPLASTY Right 2000; 06/2000   replaced w/appropriate hardware (02/17/2013)   TOTAL KNEE REVISION Right 04/13/2019   Procedure: Revision Right Knee Arthroplasty;  Surgeon: Liam Lerner, MD;  Location: [facility] ORS;  Service: Orthopedics;  Laterality: Right;   TRIGGER FINGER RELEASE  Right ~ 2012   thumb (02/17/2013)   VAGINAL HYSTERECTOMY  ~ 1994   partial    Social History   Occupational History   Occupation: Teacher, 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[employer]    Comment: Administrator, [employer] Service  Tobacco Use   Smoking status: Never   Smokeless tobacco: Never  Vaping Use   Vaping status: Never Used  Substance and Sexual Activity   Alcohol use: Not Currently    Comment: occasional wine    Drug use: No   Sexual activity: Yes    Birth control/protection: Surgical    Comment: 02/17/2013 I have a same sex partner

## 2024-03-27 ENCOUNTER — Ambulatory Visit: Payer: Self-pay | Admitting: Cardiology

## 2024-03-28 NOTE — Telephone Encounter (Signed)
 Remote transmission reviewed.  AT/AF burden is 0% No recorded episodes. Presenting is a normal 1:1 AS/VP rhythm with steady rate of 96-97 bpm.   Do not see anything in report that correlates to patients symptoms.  Forwarding to Dr. Kennyth.

## 2024-03-28 NOTE — Telephone Encounter (Signed)
 Attempted to reach patient - LM on phone requesting she send us  a remote transmission and call us  back once completed.

## 2024-03-31 ENCOUNTER — Ambulatory Visit (HOSPITAL_COMMUNITY)
Admission: RE | Admit: 2024-03-31 | Discharge: 2024-03-31 | Disposition: A | Discharge status: Home / Self Care | Source: Ambulatory Visit | Attending: Internal Medicine | Admitting: Internal Medicine

## 2024-03-31 VITALS — BP 142/72 | HR 71 | Ht 68.0 in | Wt 155.0 lb

## 2024-03-31 DIAGNOSIS — I4891 Unspecified atrial fibrillation: Secondary | ICD-10-CM

## 2024-03-31 DIAGNOSIS — I480 Paroxysmal atrial fibrillation: Secondary | ICD-10-CM | POA: Diagnosis not present

## 2024-03-31 DIAGNOSIS — D6869 Other thrombophilia: Secondary | ICD-10-CM | POA: Diagnosis not present

## 2024-03-31 NOTE — Progress Notes (Signed)
 Primary Care Physician: Frann Mabel Mt, DO Primary Cardiologist: Danelle Birmingham, MD Electrophysiologist: Danelle Birmingham, MD     Referring Physician: Dr. Kennyth     Kari  LITTIE [name] is a 73 y.o. female with a history of Mobitz II / CHB s/p PPM, HTN, UE DVT, GERD, OA, and atrial fibrillation who presents for consultation in the [facility]. Patient is on Xarelto  20 mg daily for a CHADS2VASC score of 3.  On evaluation today, patient is currently in NSR. S/p Afib ablation on 02/29/24 by Dr. Kennyth. No episodes of Afib since ablation. No chest pain or SOB. Leg sites healed without issue. No missed doses of Xarelto  20 mg daily.   Today, she denies symptoms of orthopnea, PND, lower extremity edema, dizziness, presyncope, syncope, snoring, daytime somnolence, bleeding, or neurologic sequela. The patient is tolerating medications without difficulties and is otherwise without complaint today.    she has a BMI of Body mass index is 23.57 kg/m.SABRA Filed Weights   03/31/24 1127  Weight: 70.3 kg    Current Outpatient Medications  Medication Sig Dispense Refill   B Complex-C (SUPER B COMPLEX PO) Take 2 capsules by mouth every evening.     Calcium -Magnesium -Vitamin D  (CITRACAL CALCIUM +D) 600-40-500 MG-MG-UNIT TB24 Take 1 tablet by mouth every evening.     Cholecalciferol  (VITAMIN D3) 125 MCG (5000 UT) CAPS Take 5,000 Units by mouth every evening.     Coenzyme Q10 (COQ10) 200 MG CAPS Take 200 mg by mouth every evening.     Cyanocobalamin  (VITAMIN B-12 SL) Take 1 tablet by mouth every evening.     diclofenac  sodium (VOLTAREN ) 1 % GEL Apply 1-2 g topically 4 (four) times daily as needed (knee pain.).     EPINEPHrine  0.3 mg/0.3 mL IJ SOAJ injection Inject 0.3 mg into the muscle as needed for anaphylaxis. 2 each 0   esomeprazole (NEXIUM) 40 MG capsule Take 40 mg by mouth every evening. (Patient taking differently: Take 20 mg by mouth every evening.)     fluticasone   (FLONASE ) 50 MCG/ACT nasal spray Place 1 spray into both nostrils daily as needed for allergies or rhinitis.     levothyroxine  (SYNTHROID ) 25 MCG tablet Take 1 tablet (25 mcg total) by mouth daily before breakfast. 90 tablet 3   metoprolol  succinate (TOPROL  XL) 25 MG 24 hr tablet Take 1 tablet (25 mg total) by mouth daily. 90 tablet 3   Multiple Vitamin (MULTIVITAMIN WITH MINERALS) TABS Take 1 tablet by mouth every evening. Women 50+     Probiotic Product (PROBIOTIC FORMULA PO) Take 1 capsule by mouth every evening.     rivaroxaban  (XARELTO ) 20 MG TABS tablet Take 1 tablet (20 mg total) by mouth daily with supper. 30 tablet 11   rosuvastatin  (CRESTOR ) 10 MG tablet Take 1 tablet (10 mg total) by mouth daily. (Patient taking differently: Take 5 mg by mouth daily.) 90 tablet 3   valACYclovir  (VALTREX ) 1000 MG tablet Take 1 tablet (1,000 mg total) by mouth daily as needed. 90 tablet 1   No current facility-administered medications for this encounter.    Atrial Fibrillation Management history:  Previous antiarrhythmic drugs: none Previous cardioversions:  Previous ablations: 02/29/24 Anticoagulation history: Xarelto    ROS- All systems are reviewed and negative except as per the HPI above.  Physical Exam: BP (!) 142/72   Pulse 71   Ht 5' 8 (1.727 m)   Wt 70.3 kg Comment: Patient refused to weigh.  BMI 23.57 kg/m   GEN:  Well nourished, well developed in no acute distress NECK: No JVD; No carotid bruits CARDIAC: Regular rate and rhythm, no murmurs, rubs, gallops RESPIRATORY:  Clear to auscultation without rales, wheezing or rhonchi  ABDOMEN: Soft, non-tender, non-distended EXTREMITIES:  No edema; No deformity   EKG today demonstrates  Vent. rate 71 BPM PR interval 210 ms QRS duration 150 ms QT/QTcB 434/471 ms P-R-T axes 76 87 -48 Sinus rhythm with 1st degree A-V block Left bundle branch block Abnormal ECG When compared with ECG of 29-Feb-2024 11:03, Sinus rhythm has replaced  Electronic ventricular pacemaker  Echo 04/01/22 demonstrated  1. Left ventricular ejection fraction, by estimation, is 60 to 65%. The  left ventricle has normal function. The left ventricle has no regional  wall motion abnormalities.   2. Right ventricular systolic function is normal. The right ventricular  size is normal. There is normal pulmonary artery systolic pressure. The  estimated right ventricular systolic pressure is 22.4 mmHg.   3. The mitral valve is normal in structure. No evidence of mitral  stenosis.   4. The aortic valve is grossly normal.   ASSESSMENT & PLAN CHA2DS2-VASc Score = 3  The patient's score is based upon: CHF History: 0 HTN History: 1 Diabetes History: 0 Stroke History: 0 Vascular Disease History: 0 Age Score: 1 Gender Score: 1       ASSESSMENT AND PLAN: Paroxysmal Atrial Fibrillation (ICD10:  I48.0) The patient's CHA2DS2-VASc score is 3, indicating a 3.2% annual risk of stroke.   S/p Afib ablation on 02/29/24 by Dr. Kennyth.  Patient is currently in NSR. Patient is doing well overall and would very much like to discontinue OAC at next office visit.  Secondary Hypercoagulable State (ICD10:  D68.69) The patient is at significant risk for stroke/thromboembolism based upon her CHA2DS2-VASc Score of 3.  Continue Rivaroxaban  (Xarelto ).  No missed doses.      Follow up with EP/device check as scheduled.    Terra Pac, PA-C  [facility] [address] [place], [place] [address] [phone]

## 2024-04-01 ENCOUNTER — Other Ambulatory Visit (INDEPENDENT_AMBULATORY_CARE_PROVIDER_SITE_OTHER)

## 2024-04-01 ENCOUNTER — Ambulatory Visit: Payer: Self-pay | Admitting: Family Medicine

## 2024-04-01 ENCOUNTER — Encounter: Admitting: Internal Medicine

## 2024-04-01 ENCOUNTER — Other Ambulatory Visit: Payer: Self-pay

## 2024-04-01 DIAGNOSIS — D649 Anemia, unspecified: Secondary | ICD-10-CM

## 2024-04-01 DIAGNOSIS — R5383 Other fatigue: Secondary | ICD-10-CM

## 2024-04-01 LAB — CBC WITH DIFFERENTIAL/PLATELET
Basophils Absolute: 0 K/uL (ref 0.0–0.1)
Basophils Relative: 0.7 % (ref 0.0–3.0)
Eosinophils Absolute: 0.2 K/uL (ref 0.0–0.7)
Eosinophils Relative: 3.6 % (ref 0.0–5.0)
HCT: 36.3 % (ref 36.0–46.0)
Hemoglobin: 11.8 g/dL — ABNORMAL LOW (ref 12.0–15.0)
Lymphocytes Relative: 34.7 % (ref 12.0–46.0)
Lymphs Abs: 1.5 K/uL (ref 0.7–4.0)
MCHC: 32.5 g/dL (ref 30.0–36.0)
MCV: 92.5 fl (ref 78.0–100.0)
Monocytes Absolute: 0.4 K/uL (ref 0.1–1.0)
Monocytes Relative: 9.8 % (ref 3.0–12.0)
Neutro Abs: 2.3 K/uL (ref 1.4–7.7)
Neutrophils Relative %: 51.2 % (ref 43.0–77.0)
Platelets: 235 K/uL (ref 150.0–400.0)
RBC: 3.93 Mil/uL (ref 3.87–5.11)
RDW: 15 % (ref 11.5–15.5)
WBC: 4.4 K/uL (ref 4.0–10.5)

## 2024-04-01 NOTE — Addendum Note (Signed)
 Addended by: Lynea Rollison M on: 04/01/2024 04:59 PM   Modules accepted: Orders

## 2024-04-05 ENCOUNTER — Other Ambulatory Visit (HOSPITAL_COMMUNITY): Payer: Self-pay

## 2024-04-06 ENCOUNTER — Other Ambulatory Visit (HOSPITAL_BASED_OUTPATIENT_CLINIC_OR_DEPARTMENT_OTHER): Payer: Self-pay

## 2024-04-06 ENCOUNTER — Other Ambulatory Visit (HOSPITAL_COMMUNITY): Payer: Self-pay

## 2024-04-12 DIAGNOSIS — K219 Gastro-esophageal reflux disease without esophagitis: Secondary | ICD-10-CM | POA: Diagnosis not present

## 2024-04-12 DIAGNOSIS — R1032 Left lower quadrant pain: Secondary | ICD-10-CM | POA: Diagnosis not present

## 2024-04-12 DIAGNOSIS — Z1211 Encounter for screening for malignant neoplasm of colon: Secondary | ICD-10-CM | POA: Diagnosis not present

## 2024-04-12 DIAGNOSIS — K5730 Diverticulosis of large intestine without perforation or abscess without bleeding: Secondary | ICD-10-CM | POA: Diagnosis not present

## 2024-04-26 ENCOUNTER — Ambulatory Visit: Admitting: Orthopaedic Surgery

## 2024-05-02 ENCOUNTER — Other Ambulatory Visit (HOSPITAL_BASED_OUTPATIENT_CLINIC_OR_DEPARTMENT_OTHER): Payer: Self-pay

## 2024-05-02 ENCOUNTER — Other Ambulatory Visit: Payer: Self-pay | Admitting: Family Medicine

## 2024-05-02 MED FILL — Levothyroxine Sodium Tab 25 MCG: ORAL | 90 days supply | Qty: 90 | Fill #0 | Status: CN

## 2024-05-03 ENCOUNTER — Other Ambulatory Visit (HOSPITAL_COMMUNITY): Payer: Self-pay

## 2024-05-03 ENCOUNTER — Other Ambulatory Visit (HOSPITAL_BASED_OUTPATIENT_CLINIC_OR_DEPARTMENT_OTHER): Payer: Self-pay

## 2024-05-03 MED FILL — Levothyroxine Sodium Tab 25 MCG: ORAL | 90 days supply | Qty: 90 | Fill #0 | Status: AC

## 2024-05-05 ENCOUNTER — Telehealth: Payer: Self-pay | Admitting: *Deleted

## 2024-05-05 NOTE — Telephone Encounter (Signed)
   Pre-operative Risk Assessment    Patient Name: Kari  AALIAYAH [name]  DOB: Nov 18, 1950 MRN: [id number]   Date of last office visit: 5/53/25 DR. PARKER Date of next office visit: 06/10/24 DR. [place]   Request for Surgical Clearance    Procedure:  COLONOSCOPY  Date of Surgery:  Clearance 06/17/24                                Surgeon:  DR. MANN Surgeon's Group or Practice Name:  [facility] Phone number:  [phone] Fax number:  [phone]   Type of Clearance Requested:   - Medical  - Pharmacy:  Hold Rivaroxaban  (Xarelto )     Type of Anesthesia:  PROPOFOL    Additional requests/questions:    [name]   05/05/2024, 2:00 PM

## 2024-05-09 ENCOUNTER — Telehealth: Admitting: Family Medicine

## 2024-05-09 ENCOUNTER — Encounter: Payer: Self-pay | Admitting: Family Medicine

## 2024-05-09 ENCOUNTER — Other Ambulatory Visit (HOSPITAL_COMMUNITY): Payer: Self-pay

## 2024-05-09 ENCOUNTER — Other Ambulatory Visit: Payer: Self-pay | Admitting: Family Medicine

## 2024-05-09 DIAGNOSIS — R300 Dysuria: Secondary | ICD-10-CM

## 2024-05-09 MED ORDER — NITROFURANTOIN MONOHYD MACRO 100 MG PO CAPS
100.0000 mg | ORAL_CAPSULE | Freq: Two times a day (BID) | ORAL | 0 refills | Status: AC
Start: 2024-05-09 — End: 2024-05-14

## 2024-05-09 NOTE — Progress Notes (Signed)
 Because of increased risk for complicated UTI/antibiotic resistant UTI in those over 65, the standard of care is for a urinalysis and urine culture to be obtained prior to initiation of antibiotics. As such, I feel your condition warrants further evaluation and I recommend that you be seen in a face-to-face visit.    NOTE: There will be NO CHARGE for this E-Visit   If you are having a true medical emergency, please call 911.

## 2024-05-13 ENCOUNTER — Other Ambulatory Visit (HOSPITAL_COMMUNITY): Payer: Self-pay

## 2024-05-13 ENCOUNTER — Other Ambulatory Visit (HOSPITAL_BASED_OUTPATIENT_CLINIC_OR_DEPARTMENT_OTHER): Payer: Self-pay

## 2024-05-13 NOTE — Telephone Encounter (Signed)
   Name: Kari [name]  DOB: January 28, 1951  MRN: [id number]  Primary Cardiologist: Danelle Birmingham, MD  Chart reviewed as part of pre-operative protocol coverage. The patient has an upcoming visit scheduled with Dr. Raford on 06/10/2024 at which time clearance can be addressed in case there are any issues that would impact surgical recommendations.  Colonoscopy is not scheduled until 06/17/2024 as below. I added preop FYI to appointment note so that provider is aware to address at time of outpatient visit.  Per office protocol the cardiology provider should forward their finalized clearance decision and recommendations regarding antiplatelet therapy to the requesting party below.    This message will also be routed to pharmacy pool for input on holding Xarelto  as requested below so that this information is available to the clearing provider at time of patient's appointment.   I will route this message as FYI to requesting party and remove this message from the preop box as separate preop APP input not needed at this time.   Please call with any questions.  Damien JAYSON Braver, NP  05/13/2024, 2:07 PM

## 2024-05-13 NOTE — Telephone Encounter (Signed)
 Patient with diagnosis of atrial fibrillation on Xarelto  for anticoagulation.    Procedure:  COLONOSCOPY   Date of Surgery:  Clearance 06/17/24     CHA2DS2-VASc Score = 3   This indicates a 3.2% annual risk of stroke. The patient's score is based upon: CHF History: 0 HTN History: 1 Diabetes History: 0 Stroke History: 0 Vascular Disease History: 0 Age Score: 1 Gender Score: 1   Chart notes prior provoked DVT at age 73 (around 71)  CrCl 39 Platelet count 238   Patient HAS had an Afib/aflutter ablation within the last 3 months - 02/29/24  Procedure scheduled for 06/17/24, so will be past 3 month mark from ablation.    Per office protocol, patient can hold Xarelto  for 2 days prior to procedure.   Patient will not need bridging with Lovenox  (enoxaparin ) around procedure.  **This guidance is not considered finalized until pre-operative APP has relayed final recommendations.**

## 2024-05-15 ENCOUNTER — Telehealth: Payer: Self-pay | Admitting: Gastroenterology

## 2024-05-15 ENCOUNTER — Emergency Department (HOSPITAL_BASED_OUTPATIENT_CLINIC_OR_DEPARTMENT_OTHER): Admission: EM | Admit: 2024-05-15 | Discharge: 2024-05-15 | Disposition: A | Discharge status: Home / Self Care

## 2024-05-15 ENCOUNTER — Emergency Department (HOSPITAL_BASED_OUTPATIENT_CLINIC_OR_DEPARTMENT_OTHER)

## 2024-05-15 DIAGNOSIS — K8689 Other specified diseases of pancreas: Secondary | ICD-10-CM | POA: Diagnosis not present

## 2024-05-15 DIAGNOSIS — R1032 Left lower quadrant pain: Secondary | ICD-10-CM | POA: Insufficient documentation

## 2024-05-15 DIAGNOSIS — K5730 Diverticulosis of large intestine without perforation or abscess without bleeding: Secondary | ICD-10-CM | POA: Diagnosis not present

## 2024-05-15 LAB — CBC WITH DIFFERENTIAL/PLATELET
Abs Immature Granulocytes: 0.01 K/uL (ref 0.00–0.07)
Basophils Absolute: 0 K/uL (ref 0.0–0.1)
Basophils Relative: 1 %
Eosinophils Absolute: 0.1 K/uL (ref 0.0–0.5)
Eosinophils Relative: 2 %
HCT: 35.9 % — ABNORMAL LOW (ref 36.0–46.0)
Hemoglobin: 12.3 g/dL (ref 12.0–15.0)
Immature Granulocytes: 0 %
Lymphocytes Relative: 34 %
Lymphs Abs: 1.8 K/uL (ref 0.7–4.0)
MCH: 30.8 pg (ref 26.0–34.0)
MCHC: 34.3 g/dL (ref 30.0–36.0)
MCV: 90 fL (ref 80.0–100.0)
Monocytes Absolute: 0.6 K/uL (ref 0.1–1.0)
Monocytes Relative: 11 %
Neutro Abs: 2.8 K/uL (ref 1.7–7.7)
Neutrophils Relative %: 52 %
Platelets: 231 K/uL (ref 150–400)
RBC: 3.99 MIL/uL (ref 3.87–5.11)
RDW: 14.2 % (ref 11.5–15.5)
WBC: 5.4 K/uL (ref 4.0–10.5)
nRBC: 0 % (ref 0.0–0.2)

## 2024-05-15 LAB — COMPREHENSIVE METABOLIC PANEL WITH GFR
ALT: 16 U/L (ref 0–44)
AST: 22 U/L (ref 15–41)
Albumin: 4.4 g/dL (ref 3.5–5.0)
Alkaline Phosphatase: 87 U/L (ref 38–126)
Anion gap: 12 (ref 5–15)
BUN: 13 mg/dL (ref 8–23)
CO2: 24 mmol/L (ref 22–32)
Calcium: 10 mg/dL (ref 8.9–10.3)
Chloride: 103 mmol/L (ref 98–111)
Creatinine, Ser: 1.05 mg/dL — ABNORMAL HIGH (ref 0.44–1.00)
GFR, Estimated: 57 mL/min — ABNORMAL LOW (ref >60)
Glucose, Bld: 114 mg/dL — ABNORMAL HIGH (ref 70–99)
Potassium: 3.8 mmol/L (ref 3.5–5.1)
Sodium: 139 mmol/L (ref 135–145)
Total Bilirubin: 0.4 mg/dL (ref 0.0–1.2)
Total Protein: 7.4 g/dL (ref 6.5–8.1)

## 2024-05-15 LAB — LIPASE, BLOOD: Lipase: 22 U/L (ref 11–51)

## 2024-05-15 MED ORDER — IOHEXOL 300 MG/ML  SOLN
100.0000 mL | Freq: Once | INTRAMUSCULAR | Status: AC | PRN
  Administered 2024-05-15: 100 mL via INTRAVENOUS

## 2024-05-15 MED ORDER — AMOXICILLIN-POT CLAVULANATE 875-125 MG PO TABS: 1.0000 | ORAL_TABLET | Freq: Two times a day (BID) | ORAL | 0 refills | Status: AC

## 2024-05-15 MED ORDER — SODIUM CHLORIDE 0.9 % IV BOLUS: 1000.0000 mL | Freq: Once | INTRAVENOUS | Status: DC

## 2024-05-15 NOTE — Telephone Encounter (Signed)
 Dr. Kristie patient.  Patient called the on-call service provider at 4 AM this morning because of severe abdominal pain.  She states that she has a history of diverticulitis and was recently prescribed a course of ciprofloxacin  and Flagyl  by Dr. Kristie (finished about a week ago). This temporarily helped her symptoms, but her symptoms have returned, and are just as bad as they were.  She denies fevers, chills, nausea or vomiting. She is requesting another course of antibiotics.  Will prescribe Augmentin  for 10-day course (sent to the [facility] on [facility])  Patient advised that if her symptoms are not improving, or if she develops fevers, chills or p.o. intolerance, she needs to go to the emergency department

## 2024-05-15 NOTE — ED Triage Notes (Signed)
 Pt reports LLQ pain starting last night that she believes is r/t diverticulitis flare up.  Pt finished course of flagyl  and cipro  1 week ago for same.  Colonscopy scheduled for Sept 24.  Pt denies n/v/d or other symptoms.

## 2024-05-15 NOTE — ED Provider Notes (Signed)
 [place] EMERGENCY DEPARTMENT AT [facility] Provider Note   CSN: [id number] Arrival date & time: 05/15/24  9271     Patient presents with: Abdominal Pain   Kari [name] is a 73 y.o. female.    Abdominal Pain  Patient presents because of left lower quad abdominal pain.  History of diverticulitis.  Sigmoidectomy in the past with anastomosis.  Patient states that she has had recurrent flareups of diverticulitis.  Recently had what she felt like was a flareup and gastroenterology placed her on Cipro  and Flagyl .  Patient states that she finished this medication about a week ago.  Symptoms seem to have improved.  Last night started with worsening left lower quad abdominal pain consistent with previous episodes of diverticulitis and just not feeling well.  No fever no chills.  No diarrhea yet but usually her symptoms start with first pain and then diarrhea later.  No hematochezia.  No nausea or vomiting.  Otherwise, denies all complaints.  No dysuria.  No hematuria  Previous medical history reviewed : Last discharge in July 2025.  Admitted because of A-fib.  Catheter ablation performed.     Prior to Admission medications   Medication Sig Start Date End Date Taking? Authorizing Provider  amoxicillin -clavulanate (AUGMENTIN ) 875-125 MG tablet Take 1 tablet by mouth every 12 (twelve) hours for 10 days. 05/15/24 05/25/24 Yes Simon Lavonia SAILOR, MD  B Complex-C (SUPER B COMPLEX PO) Take 2 capsules by mouth every evening.    [provider]  Calcium -Magnesium -Vitamin D  (CITRACAL CALCIUM +D) 600-40-500 MG-MG-UNIT TB24 Take 1 tablet by mouth every evening.    [provider]  Cholecalciferol  (VITAMIN D3) 125 MCG (5000 UT) CAPS Take 5,000 Units by mouth every evening.    [provider]  Coenzyme Q10 (COQ10) 200 MG CAPS Take 200 mg by mouth every evening.    [provider]  Cyanocobalamin  (VITAMIN B-12 SL) Take 1 tablet by mouth every evening.    [provider]  diclofenac  sodium (VOLTAREN ) 1 % GEL Apply 1-2 g topically 4 (four) times daily as needed (knee pain.).    [provider]  EPINEPHrine  0.3 mg/0.3 mL IJ SOAJ injection Inject 0.3 mg into the muscle as needed for anaphylaxis. 07/31/23   Silver Wonda LABOR, PA  esomeprazole (NEXIUM) 40 MG capsule Take 40 mg by mouth every evening. Patient taking differently: Take 20 mg by mouth every evening.    [provider]  fluticasone  (FLONASE ) 50 MCG/ACT nasal spray Place 1 spray into both nostrils daily as needed for allergies or rhinitis.    [provider]  levothyroxine  (SYNTHROID ) 25 MCG tablet Take 1 tablet (25 mcg total) by mouth daily before breakfast. 05/02/24   Wendling, Mabel Mt, DO  metoprolol  succinate (TOPROL  XL) 25 MG 24 hr tablet Take 1 tablet (25 mg total) by mouth daily. 11/11/23   Waddell Danelle ORN, MD  Multiple Vitamin (MULTIVITAMIN WITH MINERALS) TABS Take 1 tablet by mouth every evening. Women 50+    [provider]  Probiotic Product (PROBIOTIC FORMULA PO) Take 1 capsule by mouth every evening.    [provider]  rivaroxaban  (XARELTO ) 20 MG TABS tablet Take 1 tablet (20 mg total) by mouth daily with supper. 02/11/24   Waddell Danelle ORN, MD  rosuvastatin  (CRESTOR ) 10 MG tablet Take 1 tablet (10 mg total) by mouth daily. Patient taking differently: Take 5 mg by mouth daily. 01/29/24   Frann Mabel Mt, DO  valACYclovir  (VALTREX ) 1000 MG tablet Take 1  tablet (1,000 mg total) by mouth daily as needed. 12/01/23   Frann Mabel Mt, DO    Allergies: Eliquis  [apixaban ], Bactrim  [sulfamethoxazole -trimethoprim ], Ceftin, Vancomycin , Doxycycline , and Levofloxacin    Review of Systems  Gastrointestinal:  Positive for abdominal pain.    Updated Vital Signs BP (!) 151/81 (BP Location: Right Arm)   Pulse 69   Temp 97.7 F (36.5 C) (Oral)   Resp 14   SpO2 95%   Physical Exam Vitals and nursing note reviewed.  Constitutional:       General: She is not in acute distress.    Appearance: She is well-developed.  HENT:     Head: Normocephalic and atraumatic.  Eyes:     Conjunctiva/sclera: Conjunctivae normal.  Cardiovascular:     Rate and Rhythm: Normal rate and regular rhythm.     Heart sounds: No murmur heard. Pulmonary:     Effort: Pulmonary effort is normal. No respiratory distress.     Breath sounds: Normal breath sounds.  Abdominal:     Palpations: Abdomen is soft.     Tenderness: There is no abdominal tenderness.   Musculoskeletal:        General: No swelling.     Cervical back: Neck supple.  Skin:    General: Skin is warm and dry.     Capillary Refill: Capillary refill takes less than 2 seconds.  Neurological:     Mental Status: She is alert.  Psychiatric:        Mood and Affect: Mood normal.     (all labs ordered are listed, but only abnormal results are displayed) Labs Reviewed  CBC WITH DIFFERENTIAL/PLATELET - Abnormal; Notable for the following components:      Result Value   HCT 35.9 (*)    All other components within normal limits  COMPREHENSIVE METABOLIC PANEL WITH GFR - Abnormal; Notable for the following components:   Glucose, Bld 114 (*)    Creatinine, Ser 1.05 (*)    GFR, Estimated 57 (*)    All other components within normal limits  LIPASE, BLOOD  URINALYSIS, ROUTINE W REFLEX MICROSCOPIC    EKG: None  Radiology: CT ABDOMEN PELVIS W CONTRAST Result Date: 05/15/2024 CLINICAL DATA:  LLQ pain. history of diverticulitis. Eval for complication EXAM: CT ABDOMEN AND PELVIS WITH CONTRAST TECHNIQUE: Multidetector CT imaging of the abdomen and pelvis was performed using the standard protocol following bolus administration of intravenous contrast. RADIATION DOSE REDUCTION: This exam was performed according to the departmental dose-optimization program which includes automated exposure control, adjustment of the mA and/or kV according to patient size and/or use of iterative  reconstruction technique. CONTRAST:  OMNIPAQUE  IOHEXOL  300 MG/ML  SOLN COMPARISON:  CT abdomen pelvis 04/01/2022 FINDINGS: Lower chest: No acute abnormality.  Cardiac leads noted. Hepatobiliary: No focal liver abnormality. Status post cholecystectomy. No biliary dilatation. Pancreas: Diffusely atrophic. No focal lesion. Otherwise normal pancreatic contour. No surrounding inflammatory changes. No main pancreatic ductal dilatation. Spleen: Normal in size without focal abnormality. Adrenals/Urinary Tract: No adrenal nodule bilaterally. Bilateral kidneys enhance symmetrically. No hydronephrosis. No hydroureter. The urinary bladder is unremarkable. On delayed imaging, there is no urothelial wall thickening and there are no filling defects in the opacified portions of the bilateral collecting systems or ureters. Stomach/Bowel: Sigmoid resection stomach is within normal limits. No evidence of bowel wall thickening or dilatation. Colonic diverticulosis. Appendix appears normal. Vascular/Lymphatic: No abdominal aorta or iliac aneurysm. Mild to moderate atherosclerotic plaque of the aorta and its branches. No abdominal, pelvic, or inguinal  lymphadenopathy. Reproductive: Status post hysterectomy. No adnexal masses. Other: No intraperitoneal free fluid. No intraperitoneal free gas. No organized fluid collection. Musculoskeletal: No abdominal wall hernia or abnormality. No suspicious lytic or blastic osseous lesions. No acute displaced fracture. Multilevel degenerative changes of the spine. Total left hip arthroplasty. IMPRESSION: 1. No acute intra-abdominal or intrapelvic abnormality. Colonic diverticulosis with no acute diverticulitis. 2.  Aortic Atherosclerosis (ICD10-I70.0). 3. Status post cholecystectomy, hysterectomy, sigmoid resection. Electronically Signed   By: Morgane  Naveau M.D.   On: 05/15/2024 09:27     Procedures   Medications Ordered in the ED  sodium chloride  0.9 % bolus 1,000 mL (has no  administration in time range)  iohexol  (OMNIPAQUE ) 300 MG/ML solution 100 mL (100 mLs Intravenous Contrast Given 05/15/24 9096)                                    Medical Decision Making Amount and/or Complexity of Data Reviewed Labs: ordered. Radiology: ordered.  Risk Prescription drug management.     Patient presents because of left lower quad abdominal pain.  History of diverticulitis.  Sigmoidectomy in the past with anastomosis.  Patient states that she has had recurrent flareups of diverticulitis.  Recently had what she felt like was a flareup and gastroenterology placed her on Cipro  and Flagyl .  Patient states that she finished this medication about a week ago.  Symptoms seem to have improved.  Last night started with worsening left lower quad abdominal pain consistent with previous episodes of diverticulitis and just not feeling well.  No fever no chills.  No diarrhea yet but usually her symptoms start with first pain and then diarrhea later.  No hematochezia.  No nausea or vomiting.  Otherwise, denies all complaints.  No dysuria.  No hematuria  Previous medical history reviewed : Last discharge in July 2025.  Admitted because of A-fib.  Catheter ablation performed.   Upon exam, patient hemodynamically stable.  ANO x 3 GCS 15.   Pain to palpation left lower quadrant.  No rebound or guarding.  Obtain laboratory workup.  Unremarkable.  No leukocytosis or left shift.  No AKI.  No dysuria.  No hematuria.  Patient did not provide urine does not want to wait around to provide urine which I think is reasonable.    Did obtain CT scan.  Will evaluate for diverticulitis with or without complication including abscess or perforation.  CT scan unremarkable.  Diverticulosis but no evidence of perforation.   Given patient's extensive history of diverticulitis and she feels like this is the early beginnings of a diverticulitis flare, I do think it is reasonable to start her on Augmentin .   Recommend follow-up with gastroenterology as well.  Strict return precautions for any kind of blood in stools or increasing pain or fever.       Final diagnoses:  Left lower quadrant abdominal pain    ED Discharge Orders          Ordered    amoxicillin -clavulanate (AUGMENTIN ) 875-125 MG tablet  Every 12 hours        05/15/24 1013               Simon Lavonia SAILOR, MD 05/15/24 1016

## 2024-05-15 NOTE — Discharge Instructions (Addendum)
 On CT scan, there is no obvious inflammation that was seen.  There is no perforation.  But given your symptoms and location of pain as well as recurrent history of diverticulitis, reasonable to go ahead and treat because this could be early diverticulitis.   called in Augmentin  for you.  If anything changes such as worsening abdominal pain especially with fevers then please come back to the ED for further evaluation

## 2024-05-18 ENCOUNTER — Encounter: Admitting: Obstetrics and Gynecology

## 2024-05-25 NOTE — Progress Notes (Signed)
 Remote PPM Transmission

## 2024-05-31 ENCOUNTER — Other Ambulatory Visit (HOSPITAL_COMMUNITY): Payer: Self-pay

## 2024-05-31 NOTE — Progress Notes (Unsigned)
 Cardiology Office Note Date:  05/31/2024  Patient ID:  Kari [name], Kari [name] 09/17/1950, MRN [id number] PCP:  Frann Mabel Mt, DO  Electrophysiologist: Dr. Waddell    Chief Complaint:  post ablation visit  History of Present Illness: Kari [name] is a 73 y.o. female with history of  DVT (post lead extraction), HTN, GERD, arthritis CHB w/PPM AFib  She had PPM lead failure underwent PPM system extraction and new implant 03/24/22.  Seeing Dr. Algie over the years Developed paroxysmal AFib  Ultimately referred to Dr. Kennyth for discussion of AFib management strategy, and her concerns of long term anticoagulation, bleeding risks. Planned for ablation with thoughts towards stopping OAC ~ 8mo post ablation  AFib ablation 02/29/2024  Saw the Afib  clinic team 03/31/24, doing well, no procedural concerns, no symptoms of AFib  TODAY  She is doing well Had ~ of what she thought may have been AF ago/62mo, felt unusually tired, none otherwise No CP, SOB No near syncope or syncope No bleeding or signs of bleeding   Device information BSCi dual chamber PPM implanted 03/24/22 (Not on any current advisories)   Past Medical History:  Diagnosis Date   Arthritis    Chronic back pain    Diverticulitis    DVT (deep venous thrombosis) [facility]) age 35   s/p  knee arthroscopy    GERD (gastroesophageal reflux disease)    Herpes zoster conjunctivitis    Hypothyroidism    Menopause    Mobitz type 2 second degree AV block    2:1/notes 02/16/2013   Pneumonia 2014   PONV (postoperative nausea and vomiting)    Presence of permanent cardiac pacemaker    Seasonal allergies    spring & fall; not q year (02/17/2013)   Shoulder impingement    and left rotator cuff tear    Past Surgical History:  Procedure Laterality Date   ACHILLES TENDON SURGERY     ARTHROGRAM KNEE Right 1975   ATRIAL FIBRILLATION ABLATION N/A 02/29/2024   Procedure: ATRIAL FIBRILLATION ABLATION;   Surgeon: Kennyth Chew, MD;  Location: [facility] INVASIVE CV LAB;  Service: Cardiovascular;  Laterality: N/A;   CHOLECYSTECTOMY  1990's   colonscopy     ESOPHAGOGASTRODUODENOSCOPY     EYE SURGERY     muscle release right eye   INSERT / REPLACE / REMOVE PACEMAKER  02/17/2013   [employer] Advantio dual-chamber pacemaker, model [id number],   KNEE ARTHROSCOPY Right 1982, 1983, 1995, 1996   KNEE ARTHROSCOPY W/ ACL RECONSTRUCTION Left 1984   no reconstruction needed   LAPAROSCOPIC SIGMOID COLECTOMY N/A 01/01/2015   Procedure: LAPAROSCOPIC SIGMOID COLECTOMY;  Surgeon: Jina Nephew, MD;  Location: [facility] OR;  Service: General;  Laterality: N/A;   LEAD EXTRACTION N/A 03/24/2022   Procedure: LEAD EXTRACTION;  Surgeon: Waddell Danelle ORN, MD;  Location: [facility] INVASIVE CV LAB;  Service: Cardiovascular;  Laterality: N/A;   PACEMAKER IMPLANT N/A 03/24/2022   Procedure: PACEMAKER IMPLANT;  Surgeon: Waddell Danelle ORN, MD;  Location: [facility];  Service: Cardiovascular;  Laterality: N/A;   PERMANENT PACEMAKER INSERTION N/A 02/17/2013   Procedure: PERMANENT PACEMAKER INSERTION;  Surgeon: Danelle ORN Waddell, MD;  [facility] C[facility] LAB;  Service: Cardiovascular;  Laterality: N/A;   SHOULDER ARTHROSCOPY Left 09/05/2016   Procedure: LEFT SHOULDER ARTHROSCOPY , ACROMIOPLASTY AND ROTATOR CUFF REPAIR;  Surgeon: Maude Herald, MD;  Location: [facility] OR;  Service: Orthopedics;  Laterality: Left;   TONSILLECTOMY  1961   TOTAL HIP ARTHROPLASTY Left 12/28/2023  Procedure: ARTHROPLASTY, HIP, TOTAL, ANTERIOR APPROACH;  Surgeon: Jerri Kay HERO, MD;  Location: [facility] OR;  Service: Orthopedics;  Laterality: Left;  3-C   TOTAL KNEE ARTHROPLASTY Right 2000; 06/2000   replaced w/appropriate hardware (02/17/2013)   TOTAL KNEE REVISION Right 04/13/2019   Procedure: Revision Right Knee Arthroplasty;  Surgeon: Liam Lerner, MD;  Location: [facility] ORS;  Service: Orthopedics;  Laterality: Right;   TRIGGER FINGER RELEASE Right ~ 2012   thumb  (02/17/2013)   VAGINAL HYSTERECTOMY  ~ 1994   partial     Current Outpatient Medications  Medication Sig Dispense Refill   B Complex-C (SUPER B COMPLEX PO) Take 2 capsules by mouth every evening.     Calcium -Magnesium -Vitamin D  (CITRACAL CALCIUM +D) 600-40-500 MG-MG-UNIT TB24 Take 1 tablet by mouth every evening.     Cholecalciferol  (VITAMIN D3) 125 MCG (5000 UT) CAPS Take 5,000 Units by mouth every evening.     Coenzyme Q10 (COQ10) 200 MG CAPS Take 200 mg by mouth every evening.     Cyanocobalamin  (VITAMIN B-12 SL) Take 1 tablet by mouth every evening.     diclofenac  sodium (VOLTAREN ) 1 % GEL Apply 1-2 g topically 4 (four) times daily as needed (knee pain.).     EPINEPHrine  0.3 mg/0.3 mL IJ SOAJ injection Inject 0.3 mg into the muscle as needed for anaphylaxis. 2 each 0   esomeprazole (NEXIUM) 40 MG capsule Take 40 mg by mouth every evening. (Patient taking differently: Take 20 mg by mouth every evening.)     fluticasone  (FLONASE ) 50 MCG/ACT nasal spray Place 1 spray into both nostrils daily as needed for allergies or rhinitis.     levothyroxine  (SYNTHROID ) 25 MCG tablet Take 1 tablet (25 mcg total) by mouth daily before breakfast. 90 tablet 1   metoprolol  succinate (TOPROL  XL) 25 MG 24 hr tablet Take 1 tablet (25 mg total) by mouth daily. 90 tablet 3   Multiple Vitamin (MULTIVITAMIN WITH MINERALS) TABS Take 1 tablet by mouth every evening. Women 50+     Probiotic Product (PROBIOTIC FORMULA PO) Take 1 capsule by mouth every evening.     rivaroxaban  (XARELTO ) 20 MG TABS tablet Take 1 tablet (20 mg total) by mouth daily with supper. 30 tablet 11   rosuvastatin  (CRESTOR ) 10 MG tablet Take 1 tablet (10 mg total) by mouth daily. (Patient taking differently: Take 5 mg by mouth daily.) 90 tablet 3   valACYclovir  (VALTREX ) 1000 MG tablet Take 1 tablet (1,000 mg total) by mouth daily as needed. 90 tablet 1   No current facility-administered medications for this visit.    Allergies:   Eliquis   [apixaban ], Bactrim  [sulfamethoxazole -trimethoprim ], Ceftin, Vancomycin , Doxycycline , and Levofloxacin   Social History:  The patient  reports that she has never smoked. She has never used smokeless tobacco. She reports that she does not currently use alcohol. She reports that she does not use drugs.   Family History:  The patient's family history includes Breast cancer in her paternal aunt; COPD in her father and mother; Cancer in her father; Diabetes in her brother; Hyperlipidemia in her mother; Hypertension in her mother; Lung cancer in her father; Macular degeneration in her mother.  ROS:  Please see the history of present illness.    All other systems are reviewed and otherwise negative.   PHYSICAL EXAM:  VS:  There were no vitals taken for this visit. BMI: There is no height or weight on file to calculate BMI. Well nourished, well developed, in no acute distress HEENT: normocephalic, atraumatic  Neck: no JVD, carotid bruits or masses Cardiac:  RRR; no significant murmurs, no rubs, or gallops Lungs: CTA b/l, no wheezing, rhonchi or rales Abd: soft, nontender MS: no deformity or atrophy Ext: no edema Skin: warm and dry, no rash Neuro:  No gross deficits appreciated Psych: euthymic mood, full affect  PPM site is stable, no tethering or discomfort   EKG:  not done today (Patient declined)  Device interrogation done today and reviewed by myself:  Battery and lead measurements are good zero % AF burden (since July 7,25) No arrhythmias She is dependent Software upgrade completed  02/29/24: EPS/ablation CONCLUSIONS: 1. Successful PVI. 2. Intracardiac echo reveals normal LV size and function, trivial pericardial effusion. 3. No early apparent complications. 4. Resume Xarelto  in recovery area.   CTA Coronary 12/24/23:  1. Left Main: Normal 2. LAD: 0.95 post moderate lesion non flow-limiting 3. LCX: Normal 4. Ramus: Not applicable 5. RCA: Normal   IMPRESSION: 1. 0.95 FFR post  moderate mid LAD lesion.  Non flow-limiting.  04/01/22: TTE  1. Left ventricular ejection fraction, by estimation, is 60 to 65%. The  left ventricle has normal function. The left ventricle has no regional  wall motion abnormalities.   2. Right ventricular systolic function is normal. The right ventricular  size is normal. There is normal pulmonary artery systolic pressure. The  estimated right ventricular systolic pressure is 22.4 mmHg.   3. The mitral valve is normal in structure. No evidence of mitral  stenosis.   4. The aortic valve is grossly normal.   Recent Labs: 12/18/2023: B Natriuretic Peptide 26.8; TSH 1.586 05/15/2024: ALT 16; BUN 13; Creatinine, Ser 1.05; Hemoglobin 12.3; Platelets 231; Potassium 3.8; Sodium 139  01/28/2024: Cholesterol 127; HDL 50.10; LDL Cholesterol 61; Total CHOL/HDL Ratio 3; Triglycerides 84.0; VLDL 16.8   CrCl cannot be calculated (Unknown ideal weight.).   Wt Readings from Last 3 Encounters:  03/31/24 155 lb (70.3 kg)  03/09/24 155 lb (70.3 kg)  03/01/24 169 lb 6.4 oz (76.8 kg)     Other studies reviewed: Additional studies/records reviewed today include: summarized above  ASSESSMENT AND PLAN:  PPM Intact function no programming changes made  3. HTN Looks ok   4. AFlutter Paroxysmal Afib CHA2DS2Vasc is 2, on Xarelto  In review of her visit with Dr.  Shaune note/patient today Will stop Xarelto  Start ECASA 81mg  daily given some coronary plaque  She would like to transition to Dr. Kennyth going forward with Dr. Adrian upcoming retirement  Disposition: F/u with us  in 70mo, sooner if needed  Current medicines are reviewed at length with the patient today.  The patient did not have any concerns regarding medicines.  Bonney Charlies Arthur, PA-C 05/31/2024 12:45 PM     [facility] [address] [place] [address] [phone] (office)  [phone] (fax)

## 2024-06-02 ENCOUNTER — Encounter: Payer: Self-pay | Admitting: Physician Assistant

## 2024-06-02 ENCOUNTER — Other Ambulatory Visit (HOSPITAL_COMMUNITY): Payer: Self-pay

## 2024-06-02 ENCOUNTER — Ambulatory Visit: Attending: Physician Assistant | Admitting: Physician Assistant

## 2024-06-02 VITALS — BP 122/64 | HR 66 | Ht 68.0 in

## 2024-06-02 DIAGNOSIS — I10 Essential (primary) hypertension: Secondary | ICD-10-CM

## 2024-06-02 DIAGNOSIS — Z950 Presence of cardiac pacemaker: Secondary | ICD-10-CM | POA: Diagnosis not present

## 2024-06-02 DIAGNOSIS — I480 Paroxysmal atrial fibrillation: Secondary | ICD-10-CM

## 2024-06-02 DIAGNOSIS — I483 Typical atrial flutter: Secondary | ICD-10-CM | POA: Diagnosis not present

## 2024-06-02 LAB — CUP PACEART INCLINIC DEVICE CHECK
Date Time Interrogation Session: 20251009170300
Implantable Lead Connection Status: 753985
Implantable Lead Connection Status: 753985
Implantable Lead Implant Date: 20230731
Implantable Lead Implant Date: 20230731
Implantable Lead Location: 753859
Implantable Lead Location: 753860
Implantable Lead Model: 7841
Implantable Lead Model: 7842
Implantable Lead Serial Number: 1215821
Implantable Lead Serial Number: 1304218
Implantable Pulse Generator Implant Date: 20230731
Lead Channel Impedance Value: 728 Ohm
Lead Channel Impedance Value: 816 Ohm
Lead Channel Pacing Threshold Amplitude: 0.6 V
Lead Channel Pacing Threshold Amplitude: 0.8 V
Lead Channel Pacing Threshold Pulse Width: 0.4 ms
Lead Channel Pacing Threshold Pulse Width: 0.6 ms
Lead Channel Sensing Intrinsic Amplitude: 8.2 mV
Lead Channel Setting Pacing Amplitude: 2 V
Lead Channel Setting Pacing Amplitude: 2.5 V
Lead Channel Setting Pacing Pulse Width: 0.6 ms
Lead Channel Setting Sensing Sensitivity: 4 mV
Pulse Gen Serial Number: 600527
Zone Setting Status: 755011

## 2024-06-02 MED ORDER — COMIRNATY 30 MCG/0.3ML IM SUSY
0.3000 mL | PREFILLED_SYRINGE | Freq: Once | INTRAMUSCULAR | 0 refills | Status: AC
  Filled 2024-06-02: qty 0.3, 1d supply, fill #0

## 2024-06-02 MED ORDER — ASPIRIN 81 MG PO TBEC: 81.0000 mg | DELAYED_RELEASE_TABLET | Freq: Every day | ORAL | Status: AC

## 2024-06-02 NOTE — Patient Instructions (Signed)
 Medication Instructions:  1.Stop xarelto  2.Start over the counter EC aspirin  81 mg daily *If you need a refill on your cardiac medications before your next appointment, please call your pharmacy*  Lab Work: None ordered If you have labs (blood work) drawn today and your tests are completely normal, you will receive your results only by: MyChart Message (if you have MyChart) OR A paper copy in the mail If you have any lab test that is abnormal or we need to change your treatment, we will call you to review the results.  Follow-Up: At [facility], you and your health needs are our priority.  As part of our continuing mission to provide you with exceptional heart care, our providers are all part of one team.  This team includes your primary Cardiologist (physician) and Advanced Practice Providers or APPs (Physician Assistants and Nurse Practitioners) who all work together to provide you with the care you need, when you need it.  Your next appointment:   6 month(s)  Provider:   Fonda Kitty, MD

## 2024-06-03 ENCOUNTER — Other Ambulatory Visit (HOSPITAL_COMMUNITY): Payer: Self-pay

## 2024-06-06 ENCOUNTER — Ambulatory Visit: Payer: Self-pay | Admitting: Cardiology

## 2024-06-10 ENCOUNTER — Ambulatory Visit (HOSPITAL_BASED_OUTPATIENT_CLINIC_OR_DEPARTMENT_OTHER): Admitting: Cardiovascular Disease

## 2024-06-10 NOTE — Telephone Encounter (Signed)
 Incoming fax requesting update on preop clearance request, looks like the clearance wasn't addressed at most recent OV. Please advise

## 2024-06-13 ENCOUNTER — Encounter: Payer: Self-pay | Admitting: Internal Medicine

## 2024-06-13 ENCOUNTER — Telehealth: Payer: Self-pay

## 2024-06-13 NOTE — Telephone Encounter (Signed)
   Pre-operative Risk Assessment    Patient Name: Kari [name]  DOB: [birth year] MRN: [id number]   Date of last office visit: 06/06/24 CHARLIES ARTHUR, PA-C Date of next office visit: NONE   Request for Surgical Clearance    Procedure:  COLONOSCOPY  Date of Surgery:  Clearance 06/17/24                                Surgeon:  DR KRISTIE Surgeon's Group or Practice Name:  [facility], [place] Phone number:  [phone] Fax number:  [phone]   Type of Clearance Requested:   - Medical  - Pharmacy:  Hold Aspirin      Type of Anesthesia:  PROPOFOL    Additional requests/questions:    SignedLucie DELENA Ku   06/13/2024, 10:47 AM

## 2024-06-13 NOTE — Progress Notes (Signed)
 PERIOPERATIVE PRESCRIPTION FOR IMPLANTED CARDIAC DEVICE PROGRAMMING  Patient Information: Name:  Kari [name]  DOB:  [birth year]  MRN:  [id number]  Procedure:  COLONOSCOPY   Date of Surgery:  Clearance 06/17/24                                 Surgeon:  DR. MANN Surgeon's Group or Practice Name:  [facility] Phone number:  [phone] Fax number:  [phone]   Type of Clearance Requested:   - This document is for device clearance ONLY   Type of Anesthesia:  PROPOFOL  Device Information:  Clinic EP Physician:  Danelle Birmingham, MD   Device Type:  Pacemaker Manufacturer and Phone #:  [employer]: [phone] Pacemaker Dependent?:  Yes.   Date of Last Device Check:  06/02/2024 Normal Device Function?:  Yes.    Electrophysiologist's Recommendations:  Have magnet available. Provide continuous ECG monitoring when magnet is used or reprogramming is to be performed.  Procedure may interfere with device function.  Magnet should be placed over device during procedure.  Per Device Clinic Standing Orders, Delon DELENA Sharps, RN  3:24 PM 06/13/2024

## 2024-06-15 NOTE — Telephone Encounter (Signed)
   Patient Name: Kari [name]  DOB: July 06, 1951 MRN: [id number]  Primary Cardiologist: Danelle Birmingham, MD  Chart reviewed as part of pre-operative protocol coverage. Given past medical history and time since last visit, based on ACC/AHA guidelines, Kari [name] is at acceptable risk for the planned procedure without further cardiovascular testing.   Per office protocol, patient can hold Xarelto  for 2 days prior to procedure.  Patient will not need bridging with Lovenox  (enoxaparin ) around procedure.  The patient was advised that if she develops new symptoms prior to surgery to contact our office to arrange for a follow-up visit, and she verbalized understanding.  I will route this recommendation to the requesting party via Epic fax function and remove from pre-op pool.  Please call with questions.  Lamarr Satterfield, NP 06/15/2024, 1:07 PM

## 2024-06-17 DIAGNOSIS — Z980 Intestinal bypass and anastomosis status: Secondary | ICD-10-CM | POA: Diagnosis not present

## 2024-06-17 DIAGNOSIS — Z1211 Encounter for screening for malignant neoplasm of colon: Secondary | ICD-10-CM | POA: Diagnosis not present

## 2024-06-17 DIAGNOSIS — Z09 Encounter for follow-up examination after completed treatment for conditions other than malignant neoplasm: Secondary | ICD-10-CM | POA: Diagnosis not present

## 2024-06-17 DIAGNOSIS — K6389 Other specified diseases of intestine: Secondary | ICD-10-CM | POA: Diagnosis not present

## 2024-06-17 DIAGNOSIS — K5730 Diverticulosis of large intestine without perforation or abscess without bleeding: Secondary | ICD-10-CM | POA: Diagnosis not present

## 2024-06-17 DIAGNOSIS — K633 Ulcer of intestine: Secondary | ICD-10-CM | POA: Diagnosis not present

## 2024-06-17 DIAGNOSIS — Z860100 Personal history of colon polyps, unspecified: Secondary | ICD-10-CM | POA: Diagnosis not present

## 2024-06-17 DIAGNOSIS — I700 Atherosclerosis of aorta: Secondary | ICD-10-CM | POA: Diagnosis not present

## 2024-06-21 ENCOUNTER — Ambulatory Visit: Payer: Commercial Managed Care - PPO

## 2024-06-21 DIAGNOSIS — I480 Paroxysmal atrial fibrillation: Secondary | ICD-10-CM | POA: Diagnosis not present

## 2024-06-22 LAB — CUP PACEART REMOTE DEVICE CHECK
Battery Remaining Longevity: 120 mo
Battery Remaining Percentage: 100 %
Brady Statistic RA Percent Paced: 45 %
Brady Statistic RV Percent Paced: 100 %
Date Time Interrogation Session: 20251028074500
Implantable Lead Connection Status: 753985
Implantable Lead Connection Status: 753985
Implantable Lead Implant Date: 20230731
Implantable Lead Implant Date: 20230731
Implantable Lead Location: 753859
Implantable Lead Location: 753860
Implantable Lead Model: 7841
Implantable Lead Model: 7842
Implantable Lead Serial Number: 1215821
Implantable Lead Serial Number: 1304218
Implantable Pulse Generator Implant Date: 20230731
Lead Channel Impedance Value: 808 Ohm
Lead Channel Impedance Value: 851 Ohm
Lead Channel Setting Pacing Amplitude: 2 V
Lead Channel Setting Pacing Amplitude: 2.5 V
Lead Channel Setting Pacing Pulse Width: 0.6 ms
Lead Channel Setting Sensing Sensitivity: 4 mV
Pulse Gen Serial Number: 600527
Zone Setting Status: 755011

## 2024-06-26 ENCOUNTER — Ambulatory Visit: Payer: Self-pay | Admitting: Cardiology

## 2024-06-27 ENCOUNTER — Encounter: Payer: Self-pay | Admitting: Radiology

## 2024-06-28 ENCOUNTER — Ambulatory Visit: Admitting: Orthopaedic Surgery

## 2024-06-28 NOTE — Progress Notes (Signed)
 Remote PPM Transmission

## 2024-07-29 ENCOUNTER — Other Ambulatory Visit (HOSPITAL_BASED_OUTPATIENT_CLINIC_OR_DEPARTMENT_OTHER): Payer: Self-pay | Admitting: Family Medicine

## 2024-07-29 ENCOUNTER — Other Ambulatory Visit (HOSPITAL_COMMUNITY): Payer: Self-pay

## 2024-07-29 DIAGNOSIS — Z1231 Encounter for screening mammogram for malignant neoplasm of breast: Secondary | ICD-10-CM

## 2024-07-29 DIAGNOSIS — H52223 Regular astigmatism, bilateral: Secondary | ICD-10-CM | POA: Diagnosis not present

## 2024-07-29 DIAGNOSIS — H5213 Myopia, bilateral: Secondary | ICD-10-CM | POA: Diagnosis not present

## 2024-07-29 DIAGNOSIS — H524 Presbyopia: Secondary | ICD-10-CM | POA: Diagnosis not present

## 2024-07-29 MED FILL — Levothyroxine Sodium Tab 25 MCG: ORAL | 90 days supply | Qty: 90 | Fill #1 | Status: CN

## 2024-08-02 ENCOUNTER — Inpatient Hospital Stay (HOSPITAL_BASED_OUTPATIENT_CLINIC_OR_DEPARTMENT_OTHER): Admission: RE | Admit: 2024-08-02

## 2024-08-05 ENCOUNTER — Other Ambulatory Visit (HOSPITAL_BASED_OUTPATIENT_CLINIC_OR_DEPARTMENT_OTHER): Payer: Self-pay

## 2024-08-05 ENCOUNTER — Other Ambulatory Visit (HOSPITAL_COMMUNITY): Payer: Self-pay

## 2024-08-05 MED FILL — Levothyroxine Sodium Tab 25 MCG: ORAL | 90 days supply | Qty: 90 | Fill #0 | Status: AC

## 2024-08-11 ENCOUNTER — Other Ambulatory Visit (HOSPITAL_COMMUNITY): Payer: Self-pay

## 2024-08-26 ENCOUNTER — Other Ambulatory Visit: Payer: Self-pay | Admitting: Family Medicine

## 2024-08-26 ENCOUNTER — Other Ambulatory Visit: Payer: Self-pay

## 2024-08-26 ENCOUNTER — Other Ambulatory Visit (HOSPITAL_COMMUNITY): Payer: Self-pay

## 2024-08-26 MED ORDER — LEVOTHYROXINE SODIUM 25 MCG PO TABS
25.0000 ug | ORAL_TABLET | Freq: Every day | ORAL | 1 refills | Status: AC
  Filled 2024-08-26: qty 90, 90d supply, fill #0

## 2024-09-06 ENCOUNTER — Other Ambulatory Visit (HOSPITAL_BASED_OUTPATIENT_CLINIC_OR_DEPARTMENT_OTHER): Payer: Self-pay

## 2024-09-06 ENCOUNTER — Other Ambulatory Visit (HOSPITAL_COMMUNITY): Payer: Self-pay

## 2024-09-20 ENCOUNTER — Ambulatory Visit: Attending: Cardiology

## 2024-09-20 DIAGNOSIS — I441 Atrioventricular block, second degree: Secondary | ICD-10-CM

## 2024-09-21 LAB — CUP PACEART REMOTE DEVICE CHECK
Battery Remaining Longevity: 126 mo
Battery Remaining Percentage: 100 %
Brady Statistic RA Percent Paced: 42 %
Brady Statistic RV Percent Paced: 100 %
Date Time Interrogation Session: 20260127173000
Implantable Lead Connection Status: 753985
Implantable Lead Connection Status: 753985
Implantable Lead Implant Date: 20230731
Implantable Lead Implant Date: 20230731
Implantable Lead Location: 753859
Implantable Lead Location: 753860
Implantable Lead Model: 7841
Implantable Lead Model: 7842
Implantable Lead Serial Number: 1215821
Implantable Lead Serial Number: 1304218
Implantable Pulse Generator Implant Date: 20230731
Lead Channel Impedance Value: 727 Ohm
Lead Channel Impedance Value: 801 Ohm
Lead Channel Setting Pacing Amplitude: 2 V
Lead Channel Setting Pacing Amplitude: 2.5 V
Lead Channel Setting Pacing Pulse Width: 0.6 ms
Lead Channel Setting Sensing Sensitivity: 4 mV
Pulse Gen Serial Number: 600527
Zone Setting Status: 755011

## 2024-09-22 ENCOUNTER — Ambulatory Visit (HOSPITAL_BASED_OUTPATIENT_CLINIC_OR_DEPARTMENT_OTHER)

## 2024-09-25 ENCOUNTER — Ambulatory Visit: Payer: Self-pay | Admitting: Cardiology

## 2024-09-28 NOTE — Progress Notes (Signed)
 Remote PPM Transmission

## 2024-10-05 ENCOUNTER — Encounter (HOSPITAL_BASED_OUTPATIENT_CLINIC_OR_DEPARTMENT_OTHER): Admitting: Orthopaedic Surgery

## 2024-12-20 ENCOUNTER — Ambulatory Visit

## 2025-03-21 ENCOUNTER — Ambulatory Visit

## 2025-06-20 ENCOUNTER — Ambulatory Visit

## 2025-09-19 ENCOUNTER — Ambulatory Visit

## 2025-12-19 ENCOUNTER — Ambulatory Visit
